# Patient Record
Sex: Female | Born: 1935 | Race: White | Hispanic: No
Health system: Southern US, Community
[De-identification: ages and names within clinical notes are randomized; demographics above are authoritative.]

## PROBLEM LIST (undated history)

## (undated) DIAGNOSIS — M1990 Unspecified osteoarthritis, unspecified site: Secondary | ICD-10-CM

## (undated) DIAGNOSIS — N189 Chronic kidney disease, unspecified: Secondary | ICD-10-CM

## (undated) DIAGNOSIS — Z86010 Personal history of colonic polyps: Secondary | ICD-10-CM

## (undated) DIAGNOSIS — C801 Malignant (primary) neoplasm, unspecified: Secondary | ICD-10-CM

## (undated) DIAGNOSIS — T7840XA Allergy, unspecified, initial encounter: Secondary | ICD-10-CM

## (undated) DIAGNOSIS — G250 Essential tremor: Secondary | ICD-10-CM

## (undated) DIAGNOSIS — F329 Major depressive disorder, single episode, unspecified: Secondary | ICD-10-CM

## (undated) DIAGNOSIS — R300 Dysuria: Secondary | ICD-10-CM

## (undated) DIAGNOSIS — J449 Chronic obstructive pulmonary disease, unspecified: Secondary | ICD-10-CM

## (undated) DIAGNOSIS — I482 Chronic atrial fibrillation, unspecified: Secondary | ICD-10-CM

## (undated) DIAGNOSIS — I480 Paroxysmal atrial fibrillation: Secondary | ICD-10-CM

## (undated) DIAGNOSIS — R011 Cardiac murmur, unspecified: Secondary | ICD-10-CM

## (undated) DIAGNOSIS — D472 Monoclonal gammopathy: Secondary | ICD-10-CM

## (undated) DIAGNOSIS — I872 Venous insufficiency (chronic) (peripheral): Secondary | ICD-10-CM

## (undated) DIAGNOSIS — F32A Depression, unspecified: Secondary | ICD-10-CM

## (undated) DIAGNOSIS — I10 Essential (primary) hypertension: Secondary | ICD-10-CM

## (undated) DIAGNOSIS — I341 Nonrheumatic mitral (valve) prolapse: Secondary | ICD-10-CM

## (undated) DIAGNOSIS — K219 Gastro-esophageal reflux disease without esophagitis: Secondary | ICD-10-CM

## (undated) DIAGNOSIS — I951 Orthostatic hypotension: Secondary | ICD-10-CM

## (undated) HISTORY — DX: Dysuria: R30.0

## (undated) HISTORY — DX: Major depressive disorder, single episode, unspecified: F32.9

## (undated) HISTORY — PX: APPENDECTOMY: SHX54

## (undated) HISTORY — DX: Depression, unspecified: F32.A

## (undated) HISTORY — DX: Essential (primary) hypertension: I10

## (undated) HISTORY — DX: Essential tremor: G25.0

## (undated) HISTORY — DX: Unspecified osteoarthritis, unspecified site: M19.90

## (undated) HISTORY — PX: COLONOSCOPY: SHX174

## (undated) HISTORY — DX: Personal history of colonic polyps: Z86.010

## (undated) HISTORY — DX: Allergy, unspecified, initial encounter: T78.40XA

## (undated) HISTORY — DX: Chronic obstructive pulmonary disease, unspecified: J44.9

## (undated) HISTORY — PX: CATARACT EXTRACTION: SUR2

## (undated) HISTORY — PX: DILATION AND CURETTAGE OF UTERUS: SHX78

## (undated) HISTORY — DX: Paroxysmal atrial fibrillation: I48.0

## (undated) HISTORY — DX: Orthostatic hypotension: I95.1

## (undated) HISTORY — DX: Chronic atrial fibrillation, unspecified: I48.20

## (undated) HISTORY — DX: Gastro-esophageal reflux disease without esophagitis: K21.9

## (undated) HISTORY — DX: Chronic kidney disease, unspecified: N18.9

## (undated) HISTORY — DX: Venous insufficiency (chronic) (peripheral): I87.2

## (undated) HISTORY — DX: Monoclonal gammopathy: D47.2

## (undated) HISTORY — PX: OVARIAN CYST REMOVAL: SHX89

## (undated) HISTORY — DX: Nonrheumatic mitral (valve) prolapse: I34.1

---

## 1998-01-26 ENCOUNTER — Ambulatory Visit (HOSPITAL_COMMUNITY): Admission: RE | Admit: 1998-01-26 | Discharge: 1998-01-26 | Payer: Self-pay | Admitting: Family Medicine

## 1999-01-30 ENCOUNTER — Encounter: Payer: Self-pay | Admitting: Family Medicine

## 1999-01-30 ENCOUNTER — Ambulatory Visit (HOSPITAL_COMMUNITY): Admission: RE | Admit: 1999-01-30 | Discharge: 1999-01-30 | Payer: Self-pay | Admitting: Family Medicine

## 1999-07-05 ENCOUNTER — Other Ambulatory Visit: Admission: RE | Admit: 1999-07-05 | Discharge: 1999-07-05 | Payer: Self-pay | Admitting: Family Medicine

## 2000-02-12 ENCOUNTER — Encounter: Payer: Self-pay | Admitting: Family Medicine

## 2000-02-12 ENCOUNTER — Ambulatory Visit (HOSPITAL_COMMUNITY): Admission: RE | Admit: 2000-02-12 | Discharge: 2000-02-12 | Payer: Self-pay | Admitting: Family Medicine

## 2001-02-18 ENCOUNTER — Encounter: Payer: Self-pay | Admitting: Family Medicine

## 2001-02-18 ENCOUNTER — Ambulatory Visit (HOSPITAL_COMMUNITY): Admission: RE | Admit: 2001-02-18 | Discharge: 2001-02-18 | Payer: Self-pay | Admitting: Family Medicine

## 2002-03-02 ENCOUNTER — Encounter: Payer: Self-pay | Admitting: Family Medicine

## 2002-03-02 ENCOUNTER — Ambulatory Visit (HOSPITAL_COMMUNITY): Admission: RE | Admit: 2002-03-02 | Discharge: 2002-03-02 | Payer: Self-pay | Admitting: Family Medicine

## 2003-03-10 ENCOUNTER — Encounter: Payer: Self-pay | Admitting: Family Medicine

## 2003-03-10 ENCOUNTER — Ambulatory Visit (HOSPITAL_COMMUNITY): Admission: RE | Admit: 2003-03-10 | Discharge: 2003-03-10 | Payer: Self-pay | Admitting: Family Medicine

## 2004-03-12 ENCOUNTER — Ambulatory Visit (HOSPITAL_COMMUNITY): Admission: RE | Admit: 2004-03-12 | Discharge: 2004-03-12 | Payer: Self-pay | Admitting: Family Medicine

## 2005-03-14 ENCOUNTER — Ambulatory Visit (HOSPITAL_COMMUNITY): Admission: RE | Admit: 2005-03-14 | Discharge: 2005-03-14 | Payer: Self-pay | Admitting: Family Medicine

## 2005-10-02 DIAGNOSIS — Z860100 Personal history of colon polyps, unspecified: Secondary | ICD-10-CM

## 2005-10-02 HISTORY — DX: Personal history of colon polyps, unspecified: Z86.0100

## 2005-10-02 HISTORY — DX: Personal history of colonic polyps: Z86.010

## 2006-03-18 ENCOUNTER — Ambulatory Visit (HOSPITAL_COMMUNITY): Admission: RE | Admit: 2006-03-18 | Discharge: 2006-03-18 | Payer: Self-pay | Admitting: Family Medicine

## 2007-03-25 ENCOUNTER — Encounter: Admission: RE | Admit: 2007-03-25 | Discharge: 2007-03-25 | Payer: Self-pay | Admitting: Family Medicine

## 2007-09-30 ENCOUNTER — Encounter: Admission: RE | Admit: 2007-09-30 | Discharge: 2007-11-04 | Payer: Self-pay | Admitting: Family Medicine

## 2008-03-30 ENCOUNTER — Encounter: Admission: RE | Admit: 2008-03-30 | Discharge: 2008-03-30 | Payer: Self-pay | Admitting: Family Medicine

## 2008-04-01 ENCOUNTER — Encounter: Admission: RE | Admit: 2008-04-01 | Discharge: 2008-04-01 | Payer: Self-pay | Admitting: Family Medicine

## 2008-09-15 ENCOUNTER — Emergency Department (HOSPITAL_COMMUNITY): Admission: EM | Admit: 2008-09-15 | Discharge: 2008-09-15 | Payer: Self-pay | Admitting: Emergency Medicine

## 2009-04-05 ENCOUNTER — Encounter: Admission: RE | Admit: 2009-04-05 | Discharge: 2009-04-05 | Payer: Self-pay | Admitting: Family Medicine

## 2009-04-10 ENCOUNTER — Encounter: Admission: RE | Admit: 2009-04-10 | Discharge: 2009-04-10 | Payer: Self-pay | Admitting: Family Medicine

## 2010-04-11 ENCOUNTER — Encounter: Admission: RE | Admit: 2010-04-11 | Discharge: 2010-04-11 | Payer: Self-pay | Admitting: Family Medicine

## 2010-08-13 ENCOUNTER — Encounter: Payer: Self-pay | Admitting: Family Medicine

## 2010-11-06 LAB — DIFFERENTIAL
Basophils Absolute: 0 10*3/uL (ref 0.0–0.1)
Eosinophils Relative: 0 % (ref 0–5)
Lymphocytes Relative: 4 % — ABNORMAL LOW (ref 12–46)
Lymphs Abs: 0.2 10*3/uL — ABNORMAL LOW (ref 0.7–4.0)
Neutro Abs: 4.1 10*3/uL (ref 1.7–7.7)

## 2010-11-06 LAB — COMPREHENSIVE METABOLIC PANEL
AST: 34 U/L (ref 0–37)
Albumin: 3.6 g/dL (ref 3.5–5.2)
BUN: 30 mg/dL — ABNORMAL HIGH (ref 6–23)
CO2: 25 mEq/L (ref 19–32)
Calcium: 9.4 mg/dL (ref 8.4–10.5)
Creatinine, Ser: 1.16 mg/dL (ref 0.4–1.2)
GFR calc Af Amer: 54 mL/min — ABNORMAL LOW (ref >60)
GFR calc non Af Amer: 45 mL/min — ABNORMAL LOW (ref >60)

## 2010-11-06 LAB — CBC
HCT: 40.2 % (ref 36.0–46.0)
MCHC: 34.5 g/dL (ref 30.0–36.0)
MCV: 95.7 fL (ref 78.0–100.0)
Platelets: 146 10*3/uL — ABNORMAL LOW (ref 150–400)

## 2010-11-06 LAB — PROTIME-INR: Prothrombin Time: 13.4 seconds (ref 11.6–15.2)

## 2010-11-06 LAB — CK TOTAL AND CKMB (NOT AT ARMC)
Relative Index: INVALID (ref 0.0–2.5)
Total CK: 31 U/L (ref 7–177)

## 2010-11-06 LAB — APTT: aPTT: 29 seconds (ref 24–37)

## 2011-03-26 ENCOUNTER — Other Ambulatory Visit: Payer: Self-pay | Admitting: Family Medicine

## 2011-03-26 DIAGNOSIS — Z1231 Encounter for screening mammogram for malignant neoplasm of breast: Secondary | ICD-10-CM

## 2011-04-11 ENCOUNTER — Other Ambulatory Visit: Payer: Self-pay | Admitting: Family Medicine

## 2011-04-17 ENCOUNTER — Ambulatory Visit
Admission: RE | Admit: 2011-04-17 | Discharge: 2011-04-17 | Disposition: A | Discharge status: Home / Self Care | Payer: Medicare Other | Source: Ambulatory Visit | Attending: Family Medicine | Admitting: Family Medicine

## 2011-04-17 DIAGNOSIS — Z1231 Encounter for screening mammogram for malignant neoplasm of breast: Secondary | ICD-10-CM

## 2011-04-24 ENCOUNTER — Ambulatory Visit
Admission: RE | Admit: 2011-04-24 | Discharge: 2011-04-24 | Disposition: A | Discharge status: Home / Self Care | Payer: Medicare Other | Source: Ambulatory Visit | Attending: Family Medicine | Admitting: Family Medicine

## 2011-09-30 ENCOUNTER — Other Ambulatory Visit: Payer: Self-pay | Admitting: Family Medicine

## 2011-09-30 DIAGNOSIS — R590 Localized enlarged lymph nodes: Secondary | ICD-10-CM

## 2011-09-30 DIAGNOSIS — R634 Abnormal weight loss: Secondary | ICD-10-CM

## 2011-10-02 ENCOUNTER — Ambulatory Visit
Admission: RE | Admit: 2011-10-02 | Discharge: 2011-10-02 | Disposition: A | Discharge status: Home / Self Care | Payer: Medicare Other | Source: Ambulatory Visit | Attending: Family Medicine | Admitting: Family Medicine

## 2011-10-02 DIAGNOSIS — R590 Localized enlarged lymph nodes: Secondary | ICD-10-CM

## 2011-10-02 DIAGNOSIS — R634 Abnormal weight loss: Secondary | ICD-10-CM

## 2011-10-02 MED ORDER — IOHEXOL 300 MG/ML  SOLN: 40.0000 mL | Freq: Once | INTRAMUSCULAR | Status: AC | PRN

## 2011-10-02 MED ORDER — IOHEXOL 300 MG/ML  SOLN
30.0000 mL | Freq: Once | INTRAMUSCULAR | Status: AC | PRN
  Administered 2011-10-02: 30 mL via ORAL

## 2011-10-02 MED ORDER — IOHEXOL 300 MG/ML  SOLN
100.0000 mL | Freq: Once | INTRAMUSCULAR | Status: AC | PRN
  Administered 2011-10-02: 100 mL via INTRAVENOUS

## 2011-10-23 ENCOUNTER — Encounter (INDEPENDENT_AMBULATORY_CARE_PROVIDER_SITE_OTHER): Payer: Self-pay | Admitting: General Surgery

## 2011-10-25 ENCOUNTER — Ambulatory Visit (INDEPENDENT_AMBULATORY_CARE_PROVIDER_SITE_OTHER): Payer: Medicare Other | Admitting: Surgery

## 2011-10-25 ENCOUNTER — Encounter (INDEPENDENT_AMBULATORY_CARE_PROVIDER_SITE_OTHER): Payer: Self-pay | Admitting: Surgery

## 2011-10-25 VITALS — BP 122/80 | HR 64 | Temp 98.0°F | Resp 18 | Ht 63.0 in | Wt 116.1 lb

## 2011-10-25 DIAGNOSIS — R591 Generalized enlarged lymph nodes: Secondary | ICD-10-CM

## 2011-10-25 DIAGNOSIS — R599 Enlarged lymph nodes, unspecified: Secondary | ICD-10-CM

## 2011-10-25 NOTE — Progress Notes (Signed)
CC: Enlarged lymph nodes HPI: This 78 year old lady has been referred for evaluation of some bilateral inguinal and occasional cervical adenopathy. She has also had significant weight loss over the last several months. The patient relates that to stress in her life. She has not had fevers or chills or night sweats. She did have a CT scan done which showed multiple inguinal nodes none of which appeared pathologic. They are nott symptomatic.   ROS: Her review of systems is positive for weight loss arthritis pains problems with nausea easy bruising and enlargement  MEDS: Current Outpatient Prescriptions  Medication Sig Dispense Refill  . acetaminophen (TYLENOL) 650 MG CR tablet Take 650 mg by mouth every 8 (eight) hours as needed.      Marland Kitchen alendronate (FOSAMAX) 70 MG tablet Take 70 mg by mouth every 7 (seven) days. Take with a full glass of water on an empty stomach.      Marland Kitchen atenolol (TENORMIN) 50 MG tablet Take 50 mg by mouth daily.      . Calcium Carbonate-Vitamin D (CALCIUM-D) 600-400 MG-UNIT TABS Take by mouth.      . chlordiazePOXIDE (LIBRIUM) 10 MG capsule Take 10 mg by mouth 2 (two) times daily as needed.      Marland Kitchen dextromethorphan-guaiFENesin (MUCINEX DM) 30-600 MG per 12 hr tablet Take 1 tablet by mouth as needed.      . fluticasone (FLONASE) 50 MCG/ACT nasal spray Place 2 sprays into the nose daily.      . hydrochlorothiazide (HYDRODIURIL) 25 MG tablet Take 25 mg by mouth daily.      Marland Kitchen HYDROcodone-acetaminophen (NORCO) 5-325 MG per tablet Take 1 tablet by mouth every 6 (six) hours as needed.      . hyoscyamine (LEVSIN SL) 0.125 MG SL tablet Place 0.125 mg under the tongue every 4 (four) hours as needed.      Marland Kitchen ketotifen (ZADITOR) 0.025 % ophthalmic solution 1 drop 2 (two) times daily.      . meclizine (ANTIVERT) 25 MG tablet Take 25 mg by mouth 3 (three) times daily as needed.      . Multiple Vitamin (MULTIVITAMIN) capsule Take 1 capsule by mouth daily.      . nabumetone (RELAFEN) 750 MG  tablet Take 750 mg by mouth 2 (two) times daily.      . ranitidine (ZANTAC) 300 MG tablet Take 300 mg by mouth at bedtime.      . sertraline (ZOLOFT) 50 MG tablet Take 50 mg by mouth daily.         ALLERGIES: Allergies  Allergen Reactions  . Amoxicillin Nausea And Vomiting  . Aspirin Nausea And Vomiting  . Benadryl (Altaryl) Nausea And Vomiting  . Ibuprofen Nausea And Vomiting  . Sulfa Antibiotics Itching  . Sulindac     Ineffective for arthritis  . Penicillins Rash      PE VS: BP 122/80  Pulse 64  Temp(Src) 98 F (36.7 C) (Temporal)  Resp 18  Ht 5\' 3"  (1.6 m)  Wt 116 lb 2 oz (52.674 kg)  BMI 20.57 kg/m2  GENERAL:  The patient is alert, oriented, very thin, NAD. Mood and affect are normal.  HEENT:  The head is normocephalic, the eyes nonicteric, the pupils were round regular and equal. EOMs are normal. Pharynx normal. Dentition good.  NECK:  The neck is supple and there are no masses or thyromegaly.A few small nodes that feel normal  LUNGS: Normal respirations and clear to auscultation.  HEART: Regular rhythm, with no murmurs rubs  or gallops. Pulses are intact carotid dorsalis pedis and posterior tibial. No significant varicosities are noted.   ABDOMEN: Soft, flat, and nontender. No masses or organomegaly is noted. No hernias are noted. Bowel sounds are normal.Bilateral inguinal adenopathy, multilpe 1 to 1.5 cm firm nodes  EXTREMITIES:  Good range of motion, no edema.  Data Reviewed I have reviewed the notes from her primary care physician and her CT scan and reports.  Assessment Marked inguinal adenopathy however I think benign Weight loss of uncertain etiology  Plan I discussed alternatives with the patient including followup versus excisional biopsy. She knows that she has cervical nodes were removed many years ago under local anesthesia once to go ahead and have a surgical excisional biopsy of some inguinal nodes for diagnostic purposes.

## 2011-10-25 NOTE — Patient Instructions (Signed)
We will schedule outpatient surgery to remove a few of the lymph nodes from your groin to test them.

## 2011-11-13 ENCOUNTER — Encounter (HOSPITAL_BASED_OUTPATIENT_CLINIC_OR_DEPARTMENT_OTHER): Payer: Self-pay | Admitting: *Deleted

## 2011-11-13 NOTE — Progress Notes (Signed)
To come in for bmet and ekg cxr done at dr Lorre Nick

## 2011-11-14 ENCOUNTER — Encounter (HOSPITAL_BASED_OUTPATIENT_CLINIC_OR_DEPARTMENT_OTHER)
Admission: RE | Admit: 2011-11-14 | Discharge: 2011-11-14 | Disposition: A | Discharge status: Home / Self Care | Payer: Medicare Other | Source: Ambulatory Visit | Attending: Surgery | Admitting: Surgery

## 2011-11-14 ENCOUNTER — Other Ambulatory Visit: Payer: Self-pay

## 2011-11-14 LAB — BASIC METABOLIC PANEL
BUN: 23 mg/dL (ref 6–23)
Chloride: 103 mEq/L (ref 96–112)
Glucose, Bld: 90 mg/dL (ref 70–99)
Potassium: 3.5 mEq/L (ref 3.5–5.1)

## 2011-11-18 ENCOUNTER — Ambulatory Visit (HOSPITAL_BASED_OUTPATIENT_CLINIC_OR_DEPARTMENT_OTHER): Payer: Medicare Other | Admitting: Anesthesiology

## 2011-11-18 ENCOUNTER — Ambulatory Visit (HOSPITAL_BASED_OUTPATIENT_CLINIC_OR_DEPARTMENT_OTHER)
Admission: RE | Admit: 2011-11-18 | Discharge: 2011-11-18 | Disposition: A | Discharge status: Home / Self Care | Payer: Medicare Other | Source: Ambulatory Visit | Attending: Surgery | Admitting: Surgery

## 2011-11-18 ENCOUNTER — Encounter (HOSPITAL_BASED_OUTPATIENT_CLINIC_OR_DEPARTMENT_OTHER)
Admission: RE | Disposition: A | Discharge status: Home / Self Care | Payer: Self-pay | Source: Ambulatory Visit | Attending: Surgery

## 2011-11-18 ENCOUNTER — Encounter (HOSPITAL_BASED_OUTPATIENT_CLINIC_OR_DEPARTMENT_OTHER): Payer: Self-pay | Admitting: Anesthesiology

## 2011-11-18 DIAGNOSIS — J449 Chronic obstructive pulmonary disease, unspecified: Secondary | ICD-10-CM | POA: Insufficient documentation

## 2011-11-18 DIAGNOSIS — R591 Generalized enlarged lymph nodes: Secondary | ICD-10-CM

## 2011-11-18 DIAGNOSIS — Z01810 Encounter for preprocedural cardiovascular examination: Secondary | ICD-10-CM | POA: Insufficient documentation

## 2011-11-18 DIAGNOSIS — K219 Gastro-esophageal reflux disease without esophagitis: Secondary | ICD-10-CM | POA: Insufficient documentation

## 2011-11-18 DIAGNOSIS — J4489 Other specified chronic obstructive pulmonary disease: Secondary | ICD-10-CM | POA: Insufficient documentation

## 2011-11-18 DIAGNOSIS — R634 Abnormal weight loss: Secondary | ICD-10-CM | POA: Insufficient documentation

## 2011-11-18 DIAGNOSIS — R599 Enlarged lymph nodes, unspecified: Secondary | ICD-10-CM

## 2011-11-18 DIAGNOSIS — I10 Essential (primary) hypertension: Secondary | ICD-10-CM | POA: Insufficient documentation

## 2011-11-18 HISTORY — DX: Cardiac murmur, unspecified: R01.1

## 2011-11-18 SURGERY — BIOPSY, LYMPH NODE, INGUINAL, OPEN
Anesthesia: Monitor Anesthesia Care | Site: Abdomen | Laterality: Right | Wound class: Clean

## 2011-11-18 MED ORDER — CHLORHEXIDINE GLUCONATE 4 % EX LIQD: 1.0000 "application " | Freq: Once | CUTANEOUS | Status: DC

## 2011-11-18 MED ORDER — FENTANYL CITRATE 0.05 MG/ML IJ SOLN
INTRAMUSCULAR | Status: DC | PRN
  Administered 2011-11-18: 50 ug via INTRAVENOUS
  Administered 2011-11-18: 25 ug via INTRAVENOUS

## 2011-11-18 MED ORDER — LIDOCAINE HCL (CARDIAC) 20 MG/ML IV SOLN
INTRAVENOUS | Status: DC | PRN
  Administered 2011-11-18: 50 mg via INTRAVENOUS

## 2011-11-18 MED ORDER — PROPOFOL 10 MG/ML IV EMUL
INTRAVENOUS | Status: DC | PRN
  Administered 2011-11-18: 50 ug/kg/min via INTRAVENOUS

## 2011-11-18 MED ORDER — HYDROMORPHONE HCL PF 1 MG/ML IJ SOLN: 0.2500 mg | INTRAMUSCULAR | Status: DC | PRN

## 2011-11-18 MED ORDER — LIDOCAINE-EPINEPHRINE (PF) 1 %-1:200000 IJ SOLN
INTRAMUSCULAR | Status: DC | PRN
  Administered 2011-11-18: 08:00:00 via INTRAMUSCULAR

## 2011-11-18 MED ORDER — LACTATED RINGERS IV SOLN
INTRAVENOUS | Status: DC
  Administered 2011-11-18: 07:00:00 via INTRAVENOUS

## 2011-11-18 MED ORDER — ONDANSETRON HCL 4 MG/2ML IJ SOLN
INTRAMUSCULAR | Status: DC | PRN
  Administered 2011-11-18: 4 mg via INTRAVENOUS

## 2011-11-18 MED ORDER — ONDANSETRON HCL 4 MG/2ML IJ SOLN: 4.0000 mg | Freq: Once | INTRAMUSCULAR | Status: DC | PRN

## 2011-11-18 SURGICAL SUPPLY — 36 items
ADH SKN CLS APL DERMABOND .7 (GAUZE/BANDAGES/DRESSINGS) ×1
BLADE HEX COATED 2.75 (ELECTRODE) ×2 IMPLANT
BLADE SURG 10 STRL SS (BLADE) ×2 IMPLANT
BLADE SURG ROTATE 9660 (MISCELLANEOUS) ×2 IMPLANT
CHLORAPREP W/TINT 26ML (MISCELLANEOUS) ×2 IMPLANT
CLIP TI WIDE RED SMALL 6 (CLIP) IMPLANT
CLOTH BEACON ORANGE TIMEOUT ST (SAFETY) ×2 IMPLANT
COVER MAYO STAND STRL (DRAPES) ×2 IMPLANT
COVER TABLE BACK 60X90 (DRAPES) ×2 IMPLANT
DECANTER SPIKE VIAL GLASS SM (MISCELLANEOUS) IMPLANT
DERMABOND ADVANCED (GAUZE/BANDAGES/DRESSINGS) ×1
DERMABOND ADVANCED .7 DNX12 (GAUZE/BANDAGES/DRESSINGS) ×1 IMPLANT
DRAPE LAPAROTOMY TRNSV 102X78 (DRAPE) ×2 IMPLANT
DRAPE UTILITY XL STRL (DRAPES) ×2 IMPLANT
ELECT REM PT RETURN 9FT ADLT (ELECTROSURGICAL) ×2
ELECTRODE REM PT RTRN 9FT ADLT (ELECTROSURGICAL) ×1 IMPLANT
GLOVE ECLIPSE 6.5 STRL STRAW (GLOVE) ×1 IMPLANT
GLOVE EUDERMIC 7 POWDERFREE (GLOVE) ×2 IMPLANT
GOWN PREVENTION PLUS XLARGE (GOWN DISPOSABLE) ×4 IMPLANT
NDL HYPO 25X1 1.5 SAFETY (NEEDLE) ×1 IMPLANT
NEEDLE HYPO 25X1 1.5 SAFETY (NEEDLE) ×2 IMPLANT
NS IRRIG 1000ML POUR BTL (IV SOLUTION) IMPLANT
PACK BASIN DAY SURGERY FS (CUSTOM PROCEDURE TRAY) ×2 IMPLANT
PENCIL BUTTON HOLSTER BLD 10FT (ELECTRODE) ×2 IMPLANT
SLEEVE SCD COMPRESS KNEE MED (MISCELLANEOUS) IMPLANT
SPONGE INTESTINAL PEANUT (DISPOSABLE) ×2 IMPLANT
SPONGE LAP 4X18 X RAY DECT (DISPOSABLE) ×2 IMPLANT
SUT MNCRL AB 4-0 PS2 18 (SUTURE) ×2 IMPLANT
SUT VIC AB 3-0 CT1 27 (SUTURE) ×4
SUT VIC AB 3-0 CT1 27XBRD (SUTURE) ×2 IMPLANT
SUT VIC AB 4-0 BRD 54 (SUTURE) ×2 IMPLANT
SYR CONTROL 10ML LL (SYRINGE) ×2 IMPLANT
TAPE HYPAFIX 4 X10 (GAUZE/BANDAGES/DRESSINGS) IMPLANT
TOWEL OR 17X24 6PK STRL BLUE (TOWEL DISPOSABLE) ×2 IMPLANT
TOWEL OR NON WOVEN STRL DISP B (DISPOSABLE) ×2 IMPLANT
WATER STERILE IRR 1000ML POUR (IV SOLUTION) IMPLANT

## 2011-11-18 NOTE — Interval H&P Note (Signed)
History and Physical Interval Note:  11/18/2011 7:13 AM  [name]  has presented today for surgery, with the diagnosis of Lymphadenopathy  The various methods of treatment have been discussed with the patient and family. After consideration of risks, benefits and other options for treatment, the patient has consented to  Procedure(s) (LRB): INGUINAL LYMPH NODE BIOPSY (Right) as a surgical intervention .  The patients' history has been reviewed, patient examined, no change in status, stable for surgery.  I have reviewed the patients' chart and labs.  Questions were answered to the patient's satisfaction.     Delicia Berens J

## 2011-11-18 NOTE — Anesthesia Postprocedure Evaluation (Signed)
  Anesthesia Post-op Note  Patient: [name]  Procedure(s) Performed: Procedure(s) (LRB): INGUINAL LYMPH NODE BIOPSY (Right)  Patient Location: PACU  Anesthesia Type: MAC  Level of Consciousness: awake, alert  and oriented  Airway and Oxygen Therapy: Patient Spontanous Breathing  Post-op Pain: mild  Post-op Assessment: Post-op Vital signs reviewed  Post-op Vital Signs: Reviewed  Complications: No apparent anesthesia complications

## 2011-11-18 NOTE — H&P (View-Only) (Signed)
CC: Enlarged lymph nodes HPI: This 78 year old lady has been referred for evaluation of some bilateral inguinal and occasional cervical adenopathy. She has also had significant weight loss over the last several months. The patient relates that to stress in her life. She has not had fevers or chills or night sweats. She did have a CT scan done which showed multiple inguinal nodes none of which appeared pathologic. They are nott symptomatic.   ROS: Her review of systems is positive for weight loss arthritis pains problems with nausea easy bruising and enlargement  MEDS: Current Outpatient Prescriptions  Medication Sig Dispense Refill  . acetaminophen (TYLENOL) 650 MG CR tablet Take 650 mg by mouth every 8 (eight) hours as needed.      Marland Kitchen alendronate (FOSAMAX) 70 MG tablet Take 70 mg by mouth every 7 (seven) days. Take with a full glass of water on an empty stomach.      Marland Kitchen atenolol (TENORMIN) 50 MG tablet Take 50 mg by mouth daily.      . Calcium Carbonate-Vitamin D (CALCIUM-D) 600-400 MG-UNIT TABS Take by mouth.      . chlordiazePOXIDE (LIBRIUM) 10 MG capsule Take 10 mg by mouth 2 (two) times daily as needed.      Marland Kitchen dextromethorphan-guaiFENesin (MUCINEX DM) 30-600 MG per 12 hr tablet Take 1 tablet by mouth as needed.      . fluticasone (FLONASE) 50 MCG/ACT nasal spray Place 2 sprays into the nose daily.      . hydrochlorothiazide (HYDRODIURIL) 25 MG tablet Take 25 mg by mouth daily.      Marland Kitchen HYDROcodone-acetaminophen (NORCO) 5-325 MG per tablet Take 1 tablet by mouth every 6 (six) hours as needed.      . hyoscyamine (LEVSIN SL) 0.125 MG SL tablet Place 0.125 mg under the tongue every 4 (four) hours as needed.      Marland Kitchen ketotifen (ZADITOR) 0.025 % ophthalmic solution 1 drop 2 (two) times daily.      . meclizine (ANTIVERT) 25 MG tablet Take 25 mg by mouth 3 (three) times daily as needed.      . Multiple Vitamin (MULTIVITAMIN) capsule Take 1 capsule by mouth daily.      . nabumetone (RELAFEN) 750 MG  tablet Take 750 mg by mouth 2 (two) times daily.      . ranitidine (ZANTAC) 300 MG tablet Take 300 mg by mouth at bedtime.      . sertraline (ZOLOFT) 50 MG tablet Take 50 mg by mouth daily.         ALLERGIES: Allergies  Allergen Reactions  . Amoxicillin Nausea And Vomiting  . Aspirin Nausea And Vomiting  . Benadryl (Altaryl) Nausea And Vomiting  . Ibuprofen Nausea And Vomiting  . Sulfa Antibiotics Itching  . Sulindac     Ineffective for arthritis  . Penicillins Rash      PE VS: BP 122/80  Pulse 64  Temp(Src) 98 F (36.7 C) (Temporal)  Resp 18  Ht 5\' 3"  (1.6 m)  Wt 116 lb 2 oz (52.674 kg)  BMI 20.57 kg/m2  GENERAL:  The patient is alert, oriented, very thin, NAD. Mood and affect are normal.  HEENT:  The head is normocephalic, the eyes nonicteric, the pupils were round regular and equal. EOMs are normal. Pharynx normal. Dentition good.  NECK:  The neck is supple and there are no masses or thyromegaly.A few small nodes that feel normal  LUNGS: Normal respirations and clear to auscultation.  HEART: Regular rhythm, with no murmurs rubs  or gallops. Pulses are intact carotid dorsalis pedis and posterior tibial. No significant varicosities are noted.   ABDOMEN: Soft, flat, and nontender. No masses or organomegaly is noted. No hernias are noted. Bowel sounds are normal.Bilateral inguinal adenopathy, multilpe 1 to 1.5 cm firm nodes  EXTREMITIES:  Good range of motion, no edema.  Data Reviewed I have reviewed the notes from her primary care physician and her CT scan and reports.  Assessment Marked inguinal adenopathy however I think benign Weight loss of uncertain etiology  Plan I discussed alternatives with the patient including followup versus excisional biopsy. She knows that she has cervical nodes were removed many years ago under local anesthesia once to go ahead and have a surgical excisional biopsy of some inguinal nodes for diagnostic purposes.

## 2011-11-18 NOTE — Transfer of Care (Signed)
Immediate Anesthesia Transfer of Care Note  Patient: [name]  Procedure(s) Performed: Procedure(s) (LRB): INGUINAL LYMPH NODE BIOPSY (Right)  Patient Location: PACU  Anesthesia Type: MAC  Level of Consciousness: awake, alert  and oriented  Airway & Oxygen Therapy: Patient Spontanous Breathing  Post-op Assessment: Report given to PACU RN and Post -op Vital signs reviewed and stable  Post vital signs: Reviewed and stable  Complications: No apparent anesthesia complications

## 2011-11-18 NOTE — Discharge Instructions (Addendum)
You may resume all usual activity. You may shower tomorrow. There are no sutures that need to be removed. Call to make an appointment for a post op visit. Call if there are any problems or concerns. We will call you when we have a path report available, probably Thursday   Post Anesthesia Home Care Instructions  Activity: Get plenty of rest for the remainder of the day. A responsible adult should stay with you for 24 hours following the procedure.  For the next 24 hours, DO NOT: -Drive a car -Paediatric nurse -Drink alcoholic beverages -Take any medication unless instructed by your physician -Make any legal decisions or sign important papers.  Meals: Start with liquid foods such as gelatin or soup. Progress to regular foods as tolerated. Avoid greasy, spicy, heavy foods. If nausea and/or vomiting occur, drink only clear liquids until the nausea and/or vomiting subsides. Call your physician if vomiting continues.  Special Instructions/Symptoms: Your throat may feel dry or sore from the anesthesia or the breathing tube placed in your throat during surgery. If this causes discomfort, gargle with warm salt water. The discomfort should disappear within 24 hours.

## 2011-11-18 NOTE — Anesthesia Procedure Notes (Signed)
Procedure Name: MAC Performed by: Kirkland Figg W Pre-anesthesia Checklist: Patient identified, Timeout performed, Emergency Drugs available, Suction available and Patient being monitored Patient Re-evaluated:Patient Re-evaluated prior to induction Oxygen Delivery Method: Simple face mask       

## 2011-11-18 NOTE — Op Note (Signed)
[name] 07-May-1926 [id number] 10/25/2011  Preoperative diagnosis: Lymphadenopathy  Postoperative diagnosis: Same  Procedure: Excisional biopsy right inguinal lymph no  Surgeon: Haywood Lasso, MD, [facility]  Anesthesia: MAC   Clinical History and Indications: This patient was referred for evaluation of increasing bilateral adenopathy which was associated with weight loss of uncertain etiology. After review of situation she elected to proceed 2 excisional biopsy of one of the right inguinal lymph nodes.    Description of Procedure: The patient was seen in the preoperative area and the plans for the procedure reviewed. She had no further questions. The right inguinal area was marked as the surgical site.  The patient was taken to the operating room and given IV sedation. The inguinal area was prepped and draped in a time out was done.  The patient is very thin so the lymph nodes are visible. A combination of 1% Xylocaine with epinephrine and 0.5% Marcaine plain was mixed equally and used for local. This was infiltrated around the lymph node. A small inguinal incision was made and Scarpa's divided. A large lymph node was identified and carefully dissected out using hemostats to divide all of the tissue coming into this lymph node to diminish the chance of a postoperative lymphocele. This was excised completely. I thought there was adequate tissue for diagnosis and no other lymph nodes were removed.  The incision was closed in layers with 3-0 Vicryl, 4-0 Monocryl subcuticular, and Dermabond. The patient tolerated procedure well. There were no operative complications. All counts were correct. There was minimal blood loss.  Haywood Lasso, MD, FACS 11/18/2011 8:10 AM

## 2011-11-18 NOTE — Anesthesia Preprocedure Evaluation (Signed)
Anesthesia Evaluation  Patient identified by MRN, date of birth, ID band Patient awake    Reviewed: Allergy & Precautions, H&P , NPO status , Patient's Chart, lab work & pertinent test results, reviewed documented beta blocker date and time   Airway Mallampati: I TM Distance: >3 FB     Dental  (+) Teeth Intact and Dental Advisory Given   Pulmonary COPD breath sounds clear to auscultation        Cardiovascular hypertension, Rhythm:Regular Rate:Normal     Neuro/Psych    GI/Hepatic GERD-  Medicated and Controlled,  Endo/Other    Renal/GU      Musculoskeletal   Abdominal   Peds  Hematology   Anesthesia Other Findings   Reproductive/Obstetrics                           Anesthesia Physical Anesthesia Plan  ASA: III  Anesthesia Plan: MAC   Post-op Pain Management:    Induction: Intravenous  Airway Management Planned: Simple Face Mask  Additional Equipment:   Intra-op Plan:   Post-operative Plan:   Informed Consent: I have reviewed the patients History and Physical, chart, labs and discussed the procedure including the risks, benefits and alternatives for the proposed anesthesia with the patient or authorized representative who has indicated his/her understanding and acceptance.   Dental advisory given  Plan Discussed with: CRNA, Anesthesiologist and Surgeon  Anesthesia Plan Comments:         Anesthesia Quick Evaluation

## 2011-11-22 ENCOUNTER — Telehealth (INDEPENDENT_AMBULATORY_CARE_PROVIDER_SITE_OTHER): Payer: Self-pay | Admitting: General Surgery

## 2011-11-22 NOTE — Telephone Encounter (Signed)
Patient aware results are not back yet, but I will call her when I see them. She is okay with this plan.

## 2011-11-22 NOTE — Telephone Encounter (Signed)
Patient left voicemail requesting pathology results from surgery on Monday.

## 2011-11-25 ENCOUNTER — Telehealth (INDEPENDENT_AMBULATORY_CARE_PROVIDER_SITE_OTHER): Payer: Self-pay | Admitting: General Surgery

## 2011-11-25 NOTE — Telephone Encounter (Signed)
Spoke with Dr Andrew Au assistant who will get a message to him to see if he will speak with patient re: path report. He is not in the office til tomorrow but she stated she would pull it and ask him to call patient. They will call me back if this is unable to happen.

## 2011-11-25 NOTE — Telephone Encounter (Signed)
Called Dr Andrew Au office per Dr Donne Hazel re: path results. Left message for Raquel Sarna, his assistant, to call me back.

## 2011-11-26 ENCOUNTER — Telehealth (INDEPENDENT_AMBULATORY_CARE_PROVIDER_SITE_OTHER): Payer: Self-pay | Admitting: General Surgery

## 2011-11-26 ENCOUNTER — Telehealth: Payer: Self-pay | Admitting: Hematology and Oncology

## 2011-11-26 NOTE — Telephone Encounter (Signed)
lmonvm for pt re calling me for appt w/LO 

## 2011-11-26 NOTE — Telephone Encounter (Signed)
Dr Marisue Humble spoke with patient re: path results. They are referring patient to the cancer center.

## 2011-11-29 ENCOUNTER — Telehealth: Payer: Self-pay | Admitting: Oncology

## 2011-11-29 NOTE — Telephone Encounter (Signed)
lmonvm adviisng the pt of an appt to see dr odogwu on 12/11/2011. This is the second message

## 2011-12-03 ENCOUNTER — Telehealth: Payer: Self-pay | Admitting: Hematology and Oncology

## 2011-12-03 NOTE — Telephone Encounter (Signed)
Referred by Dr. Gaynelle Arabian  Dx- Follicular Lymphoma

## 2011-12-06 ENCOUNTER — Encounter (INDEPENDENT_AMBULATORY_CARE_PROVIDER_SITE_OTHER): Payer: Self-pay

## 2011-12-10 ENCOUNTER — Encounter (INDEPENDENT_AMBULATORY_CARE_PROVIDER_SITE_OTHER): Payer: Self-pay

## 2011-12-11 ENCOUNTER — Encounter: Payer: Self-pay | Admitting: Hematology and Oncology

## 2011-12-11 ENCOUNTER — Ambulatory Visit (HOSPITAL_BASED_OUTPATIENT_CLINIC_OR_DEPARTMENT_OTHER): Payer: Medicare Other | Admitting: Hematology and Oncology

## 2011-12-11 ENCOUNTER — Ambulatory Visit (HOSPITAL_BASED_OUTPATIENT_CLINIC_OR_DEPARTMENT_OTHER): Payer: Medicare Other

## 2011-12-11 ENCOUNTER — Ambulatory Visit: Payer: Medicare Other

## 2011-12-11 ENCOUNTER — Ambulatory Visit: Payer: Medicare Other | Admitting: Hematology and Oncology

## 2011-12-11 VITALS — BP 183/85 | HR 62 | Temp 98.3°F | Ht 63.0 in | Wt 112.4 lb

## 2011-12-11 DIAGNOSIS — C8299 Follicular lymphoma, unspecified, extranodal and solid organ sites: Secondary | ICD-10-CM

## 2011-12-11 DIAGNOSIS — R945 Abnormal results of liver function studies: Secondary | ICD-10-CM

## 2011-12-11 DIAGNOSIS — Z8572 Personal history of non-Hodgkin lymphomas: Secondary | ICD-10-CM | POA: Insufficient documentation

## 2011-12-11 DIAGNOSIS — C8290 Follicular lymphoma, unspecified, unspecified site: Secondary | ICD-10-CM

## 2011-12-11 DIAGNOSIS — R7989 Other specified abnormal findings of blood chemistry: Secondary | ICD-10-CM

## 2011-12-11 LAB — CBC WITH DIFFERENTIAL/PLATELET
Basophils Absolute: 0 10*3/uL (ref 0.0–0.1)
EOS%: 2.5 % (ref 0.0–7.0)
Eosinophils Absolute: 0.1 10*3/uL (ref 0.0–0.5)
HGB: 12.6 g/dL (ref 11.6–15.9)
LYMPH%: 35.3 % (ref 14.0–49.7)
MCH: 32.6 pg (ref 25.1–34.0)
MCV: 97.2 fL (ref 79.5–101.0)
MONO%: 8.1 % (ref 0.0–14.0)
Platelets: 156 10*3/uL (ref 145–400)
RBC: 3.86 10*6/uL (ref 3.70–5.45)
RDW: 13.3 % (ref 11.2–14.5)

## 2011-12-11 LAB — COMPREHENSIVE METABOLIC PANEL
Alkaline Phosphatase: 50 U/L (ref 39–117)
CO2: 30 mEq/L (ref 19–32)
Creatinine, Ser: 1.23 mg/dL — ABNORMAL HIGH (ref 0.50–1.10)
Glucose, Bld: 88 mg/dL (ref 70–99)
Sodium: 138 mEq/L (ref 135–145)
Total Bilirubin: 0.5 mg/dL (ref 0.3–1.2)
Total Protein: 8 g/dL (ref 6.0–8.3)

## 2011-12-11 LAB — LACTATE DEHYDROGENASE: LDH: 173 U/L (ref 94–250)

## 2011-12-11 NOTE — Progress Notes (Signed)
Dr.    Gaynelle Arabian      -     Primary.  [place].  Daughter    Bonner Puna     Cell    Phone      [phone].

## 2011-12-11 NOTE — Patient Instructions (Signed)
[name]  [id number]  [place] Discharge Instructions  RECOMMENDATIONS MADE BY THE CONSULTANT AND ANY TEST RESULTS WILL BE SENT TO YOUR REFERRING DOCTOR.   EXAM FINDINGS BY MD TODAY AND SIGNS AND SYMPTOMS TO REPORT TO CLINIC OR PRIMARY MD:   Your current list of medications are: Current Outpatient Prescriptions  Medication Sig Dispense Refill  . acetaminophen (TYLENOL) 650 MG CR tablet Take 650 mg by mouth every 8 (eight) hours as needed.      Marland Kitchen alendronate (FOSAMAX) 70 MG tablet Take 70 mg by mouth every 7 (seven) days. Take with a full glass of water on an empty stomach.      Marland Kitchen atenolol (TENORMIN) 50 MG tablet Take 50 mg by mouth daily.      . Calcium Carbonate-Vitamin D (CALCIUM-D) 600-400 MG-UNIT TABS Take by mouth.      . chlordiazePOXIDE (LIBRIUM) 10 MG capsule Take 10 mg by mouth 2 (two) times daily as needed.      Marland Kitchen dextromethorphan-guaiFENesin (MUCINEX DM) 30-600 MG per 12 hr tablet Take 1 tablet by mouth as needed.      . fluticasone (FLONASE) 50 MCG/ACT nasal spray Place 2 sprays into the nose daily.      . hydrochlorothiazide (HYDRODIURIL) 25 MG tablet Take 25 mg by mouth daily.      Marland Kitchen HYDROcodone-acetaminophen (NORCO) 5-325 MG per tablet Take 1 tablet by mouth every 6 (six) hours as needed.      . hyoscyamine (LEVSIN SL) 0.125 MG SL tablet Place 0.125 mg under the tongue every 4 (four) hours as needed.      Marland Kitchen ketotifen (ZADITOR) 0.025 % ophthalmic solution 1 drop 2 (two) times daily.      . meclizine (ANTIVERT) 25 MG tablet Take 25 mg by mouth 3 (three) times daily as needed.      . Multiple Vitamin (MULTIVITAMIN) capsule Take 1 capsule by mouth daily.      . nabumetone (RELAFEN) 750 MG tablet Take 750 mg by mouth 2 (two) times daily.      . ranitidine (ZANTAC) 300 MG tablet Take 300 mg by mouth at bedtime.      . sertraline (ZOLOFT) 50 MG tablet Take 50 mg by mouth daily.         INSTRUCTIONS GIVEN AND DISCUSSED:   SPECIAL  INSTRUCTIONS/FOLLOW-UP:  See above.  I acknowledge that I have been informed and understand all the instructions given to me and received a copy. I do not have any more questions at this time, but understand that I may call the [facility] at [phone] during business hours should I have any further questions or need assistance in obtaining follow-up care.

## 2011-12-11 NOTE — Progress Notes (Signed)
Pt is scheduled for Bone Marrow Biopsy on 12/12/11.   Written instructions given to pt and family.   Both [facility] Post @ [facility] lab and [place], infusion scheduler notified.

## 2011-12-11 NOTE — Progress Notes (Signed)
This office note has been dictated.

## 2011-12-11 NOTE — Progress Notes (Signed)
CC:   Modena Jansky. Marisue Humble, M.D. Haywood Lasso, M.D.  REFERRING PHYSICIAN:  Modena Jansky. Marisue Humble, M.D.  IDENTIFYING STATEMENT:  Patient is an 78 year old woman seen at request of Dr. Marisue Humble with lymphoma.  HISTORY OF PRESENT ILLNESS:  The patient describes intermittent swelling in the right and left inguinal areas over the years but more so the swelling has persisted in the last year.  She recalled that she had a node that was removed from the left neck and axilla 15 and 20 years ago. She was told that the areas were benign.  She also describes a 35 pound weight loss over 2 years.  She recently noted night sweats but denies fever or chills.  She has a good appetite but does have irritable bowel syndrome thus will have diarrhea with fatty rich foods.  Her energy levels are somewhat fair.  She is able to tolerate most of her activities of daily living but is limited with discomfort in the inguinal areas.  She received a CT scan of the abdomen and pelvis on 10/02/2011 that noted shotty bilateral inguinal nodes and a hiatal hernia, otherwise negative.  The liver, spleen and pancreas were unremarkable.  There was no mesenteric or retroperitoneal adenopathy. She was referred to Dr. Neldon Mc who, on 11/18/2011, performed an excisional biopsy of right inguinal node.  Surgical pathology revealed atypical follicular proliferation which was positive for Bcl-2 and CD20.  The overall impression was that this was clearly involvement of follicular lymphoma.  The patient is here with her daughter and niece to discuss treatment options.  PAST MEDICAL HISTORY:  Hypertension, osteoarthritis, osteoporosis, irritable bowel syndrome, chronic obstructive pulmonary disease, GERD, mitral valve prolapse, hiatal hernia.  ALLERGIES:  Amoxicillin, aspirin, Benadryl, ibuprofen, sulfa, Sulindac, penicillin.  SOCIAL HISTORY:  Denies alcohol, tobacco use.  She is estranged from her husband.  She is a  retired Network engineer.  Remains very active.  FAMILY HISTORY:  Sister died from lung cancer.  Brother had bone cancer. There is no family history for lymphoma, breast or colon cancer.  REVIEW OF SYSTEMS:  Denies fever, chills.  Notes recent night sweats. Denies anorexia.  Has lost 35 pounds over 2-year period.  GI:  Denies nausea, vomiting, abdominal pain.  Notes diarrhea.  Has irritable bowel syndrome.  Denies constipation.  Denies melena, hematochezia. Cardiovascular:  Denies chest pain, PND, orthopnea, ankle swelling. Respiratory:  Denies cough, hemoptysis, wheeze, shortness of breath. Neurologic:  Denies headaches, vision change, extremity weakness.  Rest of the review of systems negative.  PHYSICAL EXAM:  The patient is a little woman who looks younger than her stated age.  Vitals:  Pulse 62, blood pressure 182/85, temp 98.2, respirations 18, weight 112 pounds.  HEENT:  Head is atraumatic, normocephalic.  Sclerae anicteric.  Extraocular muscles intact.  Mouth moist without thrush.  Neck:  Supple.  Chest:  Clear to percussion auscultation.  CVS:  First and second heart sounds present.  No added sounds or murmurs.  Abdomen:  Soft, nontender.  Bowel sounds present. Lymph nodes:  No palpable cervical, axillary adenopathy.  Palpable shotty nontender bilateral inguinal adenopathy.  Extremities:  No calf tenderness.  Pulses present and symmetrical.  Skin:  No bruising.  CNS: Nonfocal.  IMPRESSION AND PLAN:  Mrs. Usman is a pleasant but anxious 78 year old woman diagnosed with a follicular lymphoma, Bcl-2 and CD20 positive, with B symptoms consistent with weight loss and night sweats.  On reviewing her history she may have had a similar diagnosis 15 years ago  when she had several enlarging node excised.  Her symptoms are indicators for treatment, but before we can do so, I would recommend that she complete staging with a CT scan of the neck and chest and a PET scan.  I also convinced her  to obtain a bone marrow biopsy which she will do tomorrow.  I will have her return to lab to obtain a CBC with differential, a comprehensive metabolic panel, LDH, and hepatitis panel. Whether her disease is limited or advanced, I will be recommending a CD20 monoclonal antibody such as rituximab.  We discussed treating weekly x4 and then restaging her for response.  We also discussed the possibility of placing her on maintenance therapy if she does obtain a partial/complete response to therapy.  We discussed the side effects of therapy which include infusion-related reactions, especially with the first infusion, mucocutaneous reactions, renal toxicity, possibility of tumor lysis syndrome.  The patient was given literature to review and she and her daughter will both attend chemotherapy class.  They both had a number of questions which were answered.  I spent more than half the time coordinating care.  The patient follows up in the morning for bone marrow exam.    ______________________________ Nira Retort, M.D. LIO/MEDQ  D:  12/11/2011  T:  12/11/2011  Job:  [id number]

## 2011-12-12 ENCOUNTER — Ambulatory Visit (INDEPENDENT_AMBULATORY_CARE_PROVIDER_SITE_OTHER): Payer: Medicare Other | Admitting: Surgery

## 2011-12-12 ENCOUNTER — Telehealth: Payer: Self-pay | Admitting: *Deleted

## 2011-12-12 ENCOUNTER — Other Ambulatory Visit: Payer: Self-pay | Admitting: Hematology and Oncology

## 2011-12-12 ENCOUNTER — Telehealth: Payer: Self-pay | Admitting: Hematology and Oncology

## 2011-12-12 ENCOUNTER — Encounter (INDEPENDENT_AMBULATORY_CARE_PROVIDER_SITE_OTHER): Payer: Self-pay | Admitting: Surgery

## 2011-12-12 VITALS — BP 146/82 | HR 58 | Temp 97.6°F | Resp 14 | Ht 64.0 in | Wt 111.2 lb

## 2011-12-12 DIAGNOSIS — Z9889 Other specified postprocedural states: Secondary | ICD-10-CM

## 2011-12-12 DIAGNOSIS — C8290 Follicular lymphoma, unspecified, unspecified site: Secondary | ICD-10-CM

## 2011-12-12 DIAGNOSIS — C8299 Follicular lymphoma, unspecified, extranodal and solid organ sites: Secondary | ICD-10-CM

## 2011-12-12 LAB — HEPATITIS PANEL, ACUTE
HCV Ab: NEGATIVE
Hep A IgM: NEGATIVE
Hep B C IgM: NEGATIVE
Hepatitis B Surface Ag: NEGATIVE

## 2011-12-12 LAB — BETA 2 MICROGLOBULIN, SERUM: Beta-2 Microglobulin: 3.13 mg/L — ABNORMAL HIGH (ref 1.01–1.73)

## 2011-12-12 NOTE — Telephone Encounter (Signed)
called pt and informed her that Dr. Jamse Arn wants her to keep her appt with her on 05/31.  pt will have ct scan on 05/31 @ [facility]. pt was will pick up schedule for [id number]. on 05/31.  Dr. Jamse Arn approved for pt to start chemo on 06/13 per pt request.

## 2011-12-12 NOTE — Telephone Encounter (Signed)
Spoke with daughter Bonner Puna and was informed that she is aware of pt's decision to cancel bone marrow biopsy today.   Confirmed with Stanton Kidney that pt would want to go forward with Rituxan treatments;  However,  Per Stanton Kidney,  Pt would like to get started  The week of  June 10  Since pt has other obligation the week before .    Message to md.

## 2011-12-12 NOTE — Telephone Encounter (Signed)
Per staff message from [place], I have scheduled treatment appts.  JMW  

## 2011-12-12 NOTE — Progress Notes (Signed)
NAME: [name]                                            DOB: [birth year] DATE: 12/12/2011                                                  MRN: [id number]  CC: Post op   HPI: This patient comes in for post op follow-up.Sheunderwent removal of a right inguinal LN on 11/18/11. She feels that she is doing well.  PE: General: The patient appears to be healthy, NAD Wound healing but small seroma  DATA REVIEWED: Path showed lymphoma and she has seen Dr Jamse Arn  IMPRESSION: The patient is doing well S/P LN excision, small seroma.    PLAN: Aspirated 10 cc with resolution. RTC PRN

## 2011-12-20 ENCOUNTER — Telehealth: Payer: Self-pay | Admitting: Hematology and Oncology

## 2011-12-20 ENCOUNTER — Telehealth: Payer: Self-pay | Admitting: *Deleted

## 2011-12-20 ENCOUNTER — Encounter (HOSPITAL_COMMUNITY): Payer: Self-pay

## 2011-12-20 ENCOUNTER — Encounter: Payer: Self-pay | Admitting: Hematology and Oncology

## 2011-12-20 ENCOUNTER — Ambulatory Visit (HOSPITAL_COMMUNITY)
Admission: RE | Admit: 2011-12-20 | Discharge: 2011-12-20 | Disposition: A | Discharge status: Home / Self Care | Payer: Medicare Other | Source: Ambulatory Visit | Attending: Hematology and Oncology | Admitting: Hematology and Oncology

## 2011-12-20 ENCOUNTER — Ambulatory Visit (HOSPITAL_BASED_OUTPATIENT_CLINIC_OR_DEPARTMENT_OTHER): Payer: Medicare Other | Admitting: Hematology and Oncology

## 2011-12-20 VITALS — BP 148/78 | HR 63 | Temp 97.0°F | Ht 64.0 in | Wt 111.7 lb

## 2011-12-20 DIAGNOSIS — I10 Essential (primary) hypertension: Secondary | ICD-10-CM | POA: Insufficient documentation

## 2011-12-20 DIAGNOSIS — M899 Disorder of bone, unspecified: Secondary | ICD-10-CM | POA: Insufficient documentation

## 2011-12-20 DIAGNOSIS — R945 Abnormal results of liver function studies: Secondary | ICD-10-CM

## 2011-12-20 DIAGNOSIS — C8299 Follicular lymphoma, unspecified, extranodal and solid organ sites: Secondary | ICD-10-CM

## 2011-12-20 DIAGNOSIS — J4489 Other specified chronic obstructive pulmonary disease: Secondary | ICD-10-CM | POA: Insufficient documentation

## 2011-12-20 DIAGNOSIS — I517 Cardiomegaly: Secondary | ICD-10-CM | POA: Insufficient documentation

## 2011-12-20 DIAGNOSIS — J449 Chronic obstructive pulmonary disease, unspecified: Secondary | ICD-10-CM | POA: Insufficient documentation

## 2011-12-20 DIAGNOSIS — C8290 Follicular lymphoma, unspecified, unspecified site: Secondary | ICD-10-CM

## 2011-12-20 DIAGNOSIS — J984 Other disorders of lung: Secondary | ICD-10-CM | POA: Insufficient documentation

## 2011-12-20 HISTORY — DX: Malignant (primary) neoplasm, unspecified: C80.1

## 2011-12-20 MED ORDER — IOHEXOL 300 MG/ML  SOLN
80.0000 mL | Freq: Once | INTRAMUSCULAR | Status: AC | PRN
  Administered 2011-12-20: 80 mL via INTRAVENOUS

## 2011-12-20 NOTE — Patient Instructions (Signed)
[name]  [id number]  [place] Discharge Instructions  RECOMMENDATIONS MADE BY THE CONSULTANT AND ANY TEST RESULTS WILL BE SENT TO YOUR REFERRING DOCTOR.   EXAM FINDINGS BY MD TODAY AND SIGNS AND SYMPTOMS TO REPORT TO CLINIC OR PRIMARY MD:   Your current list of medications are: Current Outpatient Prescriptions  Medication Sig Dispense Refill  . acetaminophen (TYLENOL) 650 MG CR tablet Take 650 mg by mouth every 8 (eight) hours as needed.      Marland Kitchen alendronate (FOSAMAX) 70 MG tablet Take 70 mg by mouth every 7 (seven) days. Take with a full glass of water on an empty stomach.      Marland Kitchen atenolol (TENORMIN) 50 MG tablet Take 50 mg by mouth daily.      . Calcium Carbonate-Vitamin D (CALCIUM-D) 600-400 MG-UNIT TABS Take by mouth.      . chlordiazePOXIDE (LIBRIUM) 10 MG capsule Take 10 mg by mouth 2 (two) times daily as needed.      Marland Kitchen dextromethorphan-guaiFENesin (MUCINEX DM) 30-600 MG per 12 hr tablet Take 1 tablet by mouth as needed.      . fluticasone (FLONASE) 50 MCG/ACT nasal spray Place 2 sprays into the nose daily as needed.       . hydrochlorothiazide (HYDRODIURIL) 25 MG tablet Take 25 mg by mouth daily.      Marland Kitchen HYDROcodone-acetaminophen (NORCO) 5-325 MG per tablet Take 1 tablet by mouth every 6 (six) hours as needed.      . hyoscyamine (LEVSIN SL) 0.125 MG SL tablet Place 0.125 mg under the tongue every 4 (four) hours as needed.      Marland Kitchen ketotifen (ZADITOR) 0.025 % ophthalmic solution Place 1 drop into both eyes 2 (two) times daily.       . meclizine (ANTIVERT) 25 MG tablet Take 25 mg by mouth 3 (three) times daily as needed.      . Multiple Vitamin (MULTIVITAMIN) capsule Take 1 capsule by mouth daily.      . nabumetone (RELAFEN) 750 MG tablet Take 750 mg by mouth 2 (two) times daily.      . ranitidine (ZANTAC) 300 MG tablet Take 300 mg by mouth at bedtime as needed.       . sertraline (ZOLOFT) 50 MG tablet Take 50 mg by mouth daily.         INSTRUCTIONS GIVEN AND  DISCUSSED:   SPECIAL INSTRUCTIONS/FOLLOW-UP:  See above.  I acknowledge that I have been informed and understand all the instructions given to me and received a copy. I do not have any more questions at this time, but understand that I may call the [facility] at [phone] during business hours should I have any further questions or need assistance in obtaining follow-up care.

## 2011-12-20 NOTE — Progress Notes (Signed)
This office note has been dictated.

## 2011-12-20 NOTE — Telephone Encounter (Signed)
Changed chemo class and email to mw to move tx appt to7/11 and pt aware that i will call with her appt s   aom

## 2011-12-20 NOTE — Progress Notes (Signed)
CC:   Haywood Lasso, M.D. Modena Jansky. Marisue Humble, M.D.  IDENTIFYING STATEMENT:  The patient is an 78 year old woman with follicular lymphoma who presents for followup.  INTERIM HISTORY:  The patient has declined a bone marrow biopsy, with aspiration.  A CT scan and PET scan have been scheduled for this afternoon and next week respectively.  Hepatitis panel prior to Rituxan was unremarkable.  She has yet to attend chemotherapy class.  She is also asking to have her infusion delayed to July.  She feels well but she is under a little bit of stress.  She is moving.  MEDICATIONS:  Reviewed and updated.  ALLERGIES:  None.  PHYSICAL EXAMINATION:  General:  Alert and oriented x3.  Vitals:  Pulse 63, blood pressure 140/38, temperature 97, respirations 20, weight 111.7 pounds.  HEENT:  Mouth moist.  Neck:  Supple.  Chest:  Clear.  Abdomen: Soft.  Extremities:  No edema.  LABORATORY DATA:  On 12/11/2011 white cell count 4.9, hemoglobin 12.6, hematocrit 37.5, platelets 156.  Sodium 138, potassium 3.4, chloride 98, CO2 30, BUN 26, creatinine 1.23, glucose 88, T bilirubin 0.5, alkaline phosphatase 50, AST 25, ALT 14.  As noted above hepatitis panel negative.  IMPRESSION AND PLAN:  Ms. Chhim is an 78 year old woman with a [id number] positive follicular lymphoma.  Awaiting completion of staging CT/PET scan.  The patient declined a bone marrow exam.  The patient has requested we reschedule Rituxan treatments for 01/30/2012.  She plans to attend chemotherapy class before therapy.    ______________________________ Nira Retort, M.D. LIO/MEDQ  D:  12/20/2011  T:  12/20/2011  Job:  [id number]

## 2011-12-20 NOTE — Telephone Encounter (Signed)
Per staff message from [place], I have scheduled appts.  JMW

## 2011-12-23 ENCOUNTER — Telehealth: Payer: Self-pay | Admitting: Hematology and Oncology

## 2011-12-23 ENCOUNTER — Encounter (HOSPITAL_COMMUNITY)
Admission: RE | Admit: 2011-12-23 | Discharge: 2011-12-23 | Disposition: A | Discharge status: Home / Self Care | Payer: Medicare Other | Source: Ambulatory Visit | Attending: Hematology and Oncology | Admitting: Hematology and Oncology

## 2011-12-23 ENCOUNTER — Encounter: Payer: Self-pay | Admitting: Hematology and Oncology

## 2011-12-23 ENCOUNTER — Other Ambulatory Visit: Payer: Medicare Other

## 2011-12-23 DIAGNOSIS — C8299 Follicular lymphoma, unspecified, extranodal and solid organ sites: Secondary | ICD-10-CM | POA: Insufficient documentation

## 2011-12-23 DIAGNOSIS — R945 Abnormal results of liver function studies: Secondary | ICD-10-CM

## 2011-12-23 DIAGNOSIS — Y849 Medical procedure, unspecified as the cause of abnormal reaction of the patient, or of later complication, without mention of misadventure at the time of the procedure: Secondary | ICD-10-CM | POA: Insufficient documentation

## 2011-12-23 DIAGNOSIS — IMO0002 Reserved for concepts with insufficient information to code with codable children: Secondary | ICD-10-CM | POA: Insufficient documentation

## 2011-12-23 DIAGNOSIS — R599 Enlarged lymph nodes, unspecified: Secondary | ICD-10-CM | POA: Insufficient documentation

## 2011-12-23 DIAGNOSIS — C8290 Follicular lymphoma, unspecified, unspecified site: Secondary | ICD-10-CM

## 2011-12-23 MED ORDER — FLUDEOXYGLUCOSE F - 18 (FDG) INJECTION
16.0000 | Freq: Once | INTRAVENOUS | Status: AC | PRN
  Administered 2011-12-23: 16 via INTRAVENOUS

## 2011-12-23 NOTE — Telephone Encounter (Signed)
l/m with appts for chemo and chemo class

## 2011-12-23 NOTE — Progress Notes (Signed)
Patient call me today to let me know that she is going to need some financial assistance with her chemo drug.I told her that I am going to try and help her on needy med,she said oh kay.

## 2011-12-25 ENCOUNTER — Telehealth: Payer: Self-pay | Admitting: *Deleted

## 2011-12-25 ENCOUNTER — Encounter: Payer: Self-pay | Admitting: Hematology and Oncology

## 2011-12-25 NOTE — Telephone Encounter (Signed)
Pt called wanting to know results of PET scan. Pt's   Phone     [phone].

## 2011-12-25 NOTE — Progress Notes (Signed)
I fax patient information to [facility] for co-pay assistance for Rituxan ,phone [phone] and [phone].

## 2011-12-26 ENCOUNTER — Telehealth: Payer: Self-pay | Admitting: *Deleted

## 2011-12-26 ENCOUNTER — Ambulatory Visit: Payer: Medicare Other

## 2011-12-26 NOTE — Telephone Encounter (Signed)
Called pt and informed her that PET scan results showed consistent with low grade lymphoma  As per md's instructions.   Pt stated she still would like to receive chemo treatment in July as planned.

## 2012-01-02 ENCOUNTER — Ambulatory Visit: Payer: Medicare Other

## 2012-01-09 ENCOUNTER — Ambulatory Visit: Payer: Medicare Other

## 2012-01-09 ENCOUNTER — Encounter (INDEPENDENT_AMBULATORY_CARE_PROVIDER_SITE_OTHER): Payer: Self-pay

## 2012-01-14 ENCOUNTER — Encounter: Payer: Self-pay | Admitting: Hematology and Oncology

## 2012-01-14 NOTE — Progress Notes (Signed)
[name] brought me her leukemia and lymphoma form to fill out so she can receive a grant to help with her chemo bills.  She expressed how concerned she is about her 20% after medicare pays.  I asked her if she had seen a Development worker, community, she said she did but was only offered copay assistance for rituxan.  She said she told the [facility] when her bills get too high she will try to set up payment arrangements.  I explained to her that there is assistance available to her.  I helped her fill out an EPP application, she will bring me her proof of income 01/15/12.

## 2012-01-15 ENCOUNTER — Encounter: Payer: Self-pay | Admitting: Hematology and Oncology

## 2012-01-15 NOTE — Progress Notes (Signed)
Faxed form back to the Leukemia & Lymphoma @ [phone].

## 2012-01-15 NOTE — Progress Notes (Signed)
Received Epp application for financial assistance. Based on information provided [name] should qualify for 100% ind. Forward to [employer] to process.

## 2012-01-16 ENCOUNTER — Ambulatory Visit: Payer: Medicare Other

## 2012-01-20 ENCOUNTER — Encounter: Payer: Self-pay | Admitting: Hematology and Oncology

## 2012-01-20 ENCOUNTER — Other Ambulatory Visit: Payer: Medicare Other

## 2012-01-20 NOTE — Progress Notes (Signed)
I was able to get in touch with [name] to let her know that her discount is going to be 95% instead of a 100%,she said that's fine,her income 14,940.00 for a family of one 01/20/12 - 07/22/12,I alsoI check on her application for co-pay assistance for rituximab and the  Foundation said that they would mail her a package out within the next two weeks,and I did tell her that when I talk to her.

## 2012-01-20 NOTE — Progress Notes (Signed)
Patient stop by [name] office this morning to see if she would qualify for our epp discount,and also she did let us know that she is going to be starting on rituximab.I have already started the co-pay assistance for that drug for her.

## 2012-01-24 ENCOUNTER — Ambulatory Visit: Payer: Medicare Other

## 2012-01-29 ENCOUNTER — Telehealth: Payer: Self-pay | Admitting: *Deleted

## 2012-01-29 NOTE — Telephone Encounter (Signed)
Patient called and left a message regarding her appt for tomorrow. I have called her back and left her a message to call me. JMW

## 2012-01-30 ENCOUNTER — Encounter: Payer: Self-pay | Admitting: *Deleted

## 2012-01-30 ENCOUNTER — Ambulatory Visit (HOSPITAL_BASED_OUTPATIENT_CLINIC_OR_DEPARTMENT_OTHER): Payer: Medicare Other

## 2012-01-30 VITALS — BP 121/59 | HR 55 | Temp 97.9°F

## 2012-01-30 DIAGNOSIS — R591 Generalized enlarged lymph nodes: Secondary | ICD-10-CM

## 2012-01-30 DIAGNOSIS — C8290 Follicular lymphoma, unspecified, unspecified site: Secondary | ICD-10-CM

## 2012-01-30 DIAGNOSIS — Z5112 Encounter for antineoplastic immunotherapy: Secondary | ICD-10-CM

## 2012-01-30 DIAGNOSIS — C8299 Follicular lymphoma, unspecified, extranodal and solid organ sites: Secondary | ICD-10-CM

## 2012-01-30 MED ORDER — DIPHENHYDRAMINE HCL 25 MG PO CAPS
50.0000 mg | ORAL_CAPSULE | Freq: Once | ORAL | Status: AC
  Administered 2012-01-30: 50 mg via ORAL

## 2012-01-30 MED ORDER — SODIUM CHLORIDE 0.9 % IV SOLN
Freq: Once | INTRAVENOUS | Status: AC
  Administered 2012-01-30: 09:00:00 via INTRAVENOUS

## 2012-01-30 MED ORDER — SODIUM CHLORIDE 0.9 % IV SOLN
375.0000 mg/m2 | Freq: Once | INTRAVENOUS | Status: AC
  Administered 2012-01-30: 600 mg via INTRAVENOUS
  Filled 2012-01-30: qty 60

## 2012-01-30 MED ORDER — ACETAMINOPHEN 325 MG PO TABS
650.0000 mg | ORAL_TABLET | Freq: Once | ORAL | Status: AC
  Administered 2012-01-30: 650 mg via ORAL

## 2012-01-30 NOTE — Patient Instructions (Addendum)
[place] Discharge Instructions for Patients Receiving Chemotherapy  Today you received the following chemotherapy agents Rituxan.  To help prevent nausea and vomiting after your treatment, we encourage you to take your nausea medication as ordered per MD.    If you develop nausea and vomiting that is not controlled by your nausea medication, call the clinic. If it is after clinic hours your family physician or the after hours number for the clinic or go to the Emergency Department.   BELOW ARE SYMPTOMS THAT SHOULD BE REPORTED IMMEDIATELY:  *FEVER GREATER THAN 100.5 F  *CHILLS WITH OR WITHOUT FEVER  NAUSEA AND VOMITING THAT IS NOT CONTROLLED WITH YOUR NAUSEA MEDICATION  *UNUSUAL SHORTNESS OF BREATH  *UNUSUAL BRUISING OR BLEEDING  TENDERNESS IN MOUTH AND THROAT WITH OR WITHOUT PRESENCE OF ULCERS  *URINARY PROBLEMS  *BOWEL PROBLEMS  UNUSUAL RASH Items with * indicate a potential emergency and should be followed up as soon as possible.  One of the nurses will contact you 24 hours after your treatment. Please let the nurse know about any problems that you may have experienced. Feel free to call the clinic you have any questions or concerns. The clinic phone number is [phone].   I have been informed and understand all the instructions given to me. I know to contact the clinic, my physician, or go to the Emergency Department if any problems should occur. I do not have any questions at this time, but understand that I may call the clinic during office hours   should I have any questions or need assistance in obtaining follow up care.    __________________________________________  _____________  __________ Signature of Patient or Authorized Representative            Date                   Time    __________________________________________ Nurse's Signature

## 2012-01-31 ENCOUNTER — Telehealth: Payer: Self-pay | Admitting: *Deleted

## 2012-01-31 NOTE — Telephone Encounter (Signed)
Spoke with [name].  Says she is tired.  After yesterday's treatment she went home and at bedtime, went directly to bed and slept all night.  Reports feeling like a foreign substance is in her body.  And she is just tired.   Adds that at the end of the treatment yesterday she felt like her lips were swelling and experienced itching.  Denies any body aches, chest pain or trouble swallowing and throat problems at this time.  Assured me she is fine but this nurse reviewed how to call on call provider in the event of any emergencies.  She does have tylenol and benadryl on hand in her home if needed.

## 2012-01-31 NOTE — Telephone Encounter (Signed)
Message copied by Cherylynn Ridges on Fri Jan 31, 2012 11:07 AM ------      Message from: [place]      Created: Thu Jan 30, 2012  9:35 AM      Regarding: Chemo f/u call      Contact: [phone]       First time Rituxan-Dr. Odogwu.

## 2012-02-05 ENCOUNTER — Encounter: Payer: Self-pay | Admitting: Pharmacist

## 2012-02-06 ENCOUNTER — Ambulatory Visit (HOSPITAL_BASED_OUTPATIENT_CLINIC_OR_DEPARTMENT_OTHER): Payer: Medicare Other

## 2012-02-06 VITALS — BP 151/73 | HR 47 | Temp 98.3°F

## 2012-02-06 DIAGNOSIS — C8290 Follicular lymphoma, unspecified, unspecified site: Secondary | ICD-10-CM

## 2012-02-06 DIAGNOSIS — Z5112 Encounter for antineoplastic immunotherapy: Secondary | ICD-10-CM

## 2012-02-06 DIAGNOSIS — C8299 Follicular lymphoma, unspecified, extranodal and solid organ sites: Secondary | ICD-10-CM

## 2012-02-06 DIAGNOSIS — R591 Generalized enlarged lymph nodes: Secondary | ICD-10-CM

## 2012-02-06 MED ORDER — ACETAMINOPHEN 325 MG PO TABS
650.0000 mg | ORAL_TABLET | Freq: Once | ORAL | Status: AC
  Administered 2012-02-06: 650 mg via ORAL

## 2012-02-06 MED ORDER — DIPHENHYDRAMINE HCL 25 MG PO CAPS
50.0000 mg | ORAL_CAPSULE | Freq: Once | ORAL | Status: AC
  Administered 2012-02-06: 50 mg via ORAL

## 2012-02-06 MED ORDER — SODIUM CHLORIDE 0.9 % IV SOLN
Freq: Once | INTRAVENOUS | Status: AC
  Administered 2012-02-06: 09:00:00 via INTRAVENOUS

## 2012-02-06 MED ORDER — SODIUM CHLORIDE 0.9 % IV SOLN
375.0000 mg/m2 | Freq: Once | INTRAVENOUS | Status: AC
  Administered 2012-02-06: 600 mg via INTRAVENOUS
  Filled 2012-02-06: qty 60

## 2012-02-06 NOTE — Patient Instructions (Signed)
[facility] Discharge Instructions for Patients Receiving Chemotherapy  Today you received the following chemotherapy agents Rituxan   To help prevent nausea and vomiting after your treatment, we encourage you to take your nausea medication as prescribed If you develop nausea and vomiting that is not controlled by your nausea medication, call the clinic. If it is after clinic hours your family physician or the after hours number for the clinic or go to the Emergency Department.   BELOW ARE SYMPTOMS THAT SHOULD BE REPORTED IMMEDIATELY:  *FEVER GREATER THAN 100.5 F  *CHILLS WITH OR WITHOUT FEVER  NAUSEA AND VOMITING THAT IS NOT CONTROLLED WITH YOUR NAUSEA MEDICATION  *UNUSUAL SHORTNESS OF BREATH  *UNUSUAL BRUISING OR BLEEDING  TENDERNESS IN MOUTH AND THROAT WITH OR WITHOUT PRESENCE OF ULCERS  *URINARY PROBLEMS  *BOWEL PROBLEMS  UNUSUAL RASH Items with * indicate a potential emergency and should be followed up as soon as possible.  One of the nurses will contact you 24 hours after your treatment. Please let the nurse know about any problems that you may have experienced. Feel free to call the clinic you have any questions or concerns. The clinic phone number is [phone].   I have been informed and understand all the instructions given to me. I know to contact the clinic, my physician, or go to the Emergency Department if any problems should occur. I do not have any questions at this time, but understand that I may call the clinic during office hours   should I have any questions or need assistance in obtaining follow up care.    __________________________________________  _____________  __________ Signature of Patient or Authorized Representative            Date                   Time    __________________________________________ Nurse's Signature    

## 2012-02-13 ENCOUNTER — Ambulatory Visit (HOSPITAL_BASED_OUTPATIENT_CLINIC_OR_DEPARTMENT_OTHER): Payer: Medicare Other

## 2012-02-13 VITALS — BP 148/66 | HR 54 | Temp 97.0°F

## 2012-02-13 DIAGNOSIS — Z5112 Encounter for antineoplastic immunotherapy: Secondary | ICD-10-CM

## 2012-02-13 DIAGNOSIS — R591 Generalized enlarged lymph nodes: Secondary | ICD-10-CM

## 2012-02-13 DIAGNOSIS — C8299 Follicular lymphoma, unspecified, extranodal and solid organ sites: Secondary | ICD-10-CM

## 2012-02-13 DIAGNOSIS — C8290 Follicular lymphoma, unspecified, unspecified site: Secondary | ICD-10-CM

## 2012-02-13 MED ORDER — ACETAMINOPHEN 325 MG PO TABS
650.0000 mg | ORAL_TABLET | Freq: Once | ORAL | Status: AC
  Administered 2012-02-13: 650 mg via ORAL

## 2012-02-13 MED ORDER — DIPHENHYDRAMINE HCL 25 MG PO CAPS
50.0000 mg | ORAL_CAPSULE | Freq: Once | ORAL | Status: AC
  Administered 2012-02-13: 50 mg via ORAL

## 2012-02-13 MED ORDER — SODIUM CHLORIDE 0.9 % IV SOLN
375.0000 mg/m2 | Freq: Once | INTRAVENOUS | Status: AC
  Administered 2012-02-13: 600 mg via INTRAVENOUS
  Filled 2012-02-13: qty 60

## 2012-02-13 MED ORDER — SODIUM CHLORIDE 0.9 % IV SOLN
Freq: Once | INTRAVENOUS | Status: AC
  Administered 2012-02-13: 09:00:00 via INTRAVENOUS

## 2012-02-13 NOTE — Patient Instructions (Addendum)
[facility] Discharge Instructions for Patients Receiving Chemotherapy  Today you received the following chemotherapy agents Rituxan   To help prevent nausea and vomiting after your treatment, we encourage you to take your nausea medication as prescribed If you develop nausea and vomiting that is not controlled by your nausea medication, call the clinic. If it is after clinic hours your family physician or the after hours number for the clinic or go to the Emergency Department.   BELOW ARE SYMPTOMS THAT SHOULD BE REPORTED IMMEDIATELY:  *FEVER GREATER THAN 100.5 F  *CHILLS WITH OR WITHOUT FEVER  NAUSEA AND VOMITING THAT IS NOT CONTROLLED WITH YOUR NAUSEA MEDICATION  *UNUSUAL SHORTNESS OF BREATH  *UNUSUAL BRUISING OR BLEEDING  TENDERNESS IN MOUTH AND THROAT WITH OR WITHOUT PRESENCE OF ULCERS  *URINARY PROBLEMS  *BOWEL PROBLEMS  UNUSUAL RASH Items with * indicate a potential emergency and should be followed up as soon as possible.  One of the nurses will contact you 24 hours after your treatment. Please let the nurse know about any problems that you may have experienced. Feel free to call the clinic you have any questions or concerns. The clinic phone number is [phone].   I have been informed and understand all the instructions given to me. I know to contact the clinic, my physician, or go to the Emergency Department if any problems should occur. I do not have any questions at this time, but understand that I may call the clinic during office hours   should I have any questions or need assistance in obtaining follow up care.    __________________________________________  _____________  __________ Signature of Patient or Authorized Representative            Date                   Time    __________________________________________ Nurse's Signature    

## 2012-02-19 ENCOUNTER — Other Ambulatory Visit: Payer: Self-pay | Admitting: Hematology and Oncology

## 2012-02-19 DIAGNOSIS — C8590 Non-Hodgkin lymphoma, unspecified, unspecified site: Secondary | ICD-10-CM

## 2012-02-20 ENCOUNTER — Ambulatory Visit (HOSPITAL_BASED_OUTPATIENT_CLINIC_OR_DEPARTMENT_OTHER): Payer: Medicare Other

## 2012-02-20 ENCOUNTER — Ambulatory Visit (HOSPITAL_BASED_OUTPATIENT_CLINIC_OR_DEPARTMENT_OTHER): Payer: Medicare Other | Admitting: Lab

## 2012-02-20 ENCOUNTER — Other Ambulatory Visit: Payer: Self-pay | Admitting: *Deleted

## 2012-02-20 VITALS — BP 167/80 | HR 53 | Temp 95.2°F

## 2012-02-20 DIAGNOSIS — C8299 Follicular lymphoma, unspecified, extranodal and solid organ sites: Secondary | ICD-10-CM

## 2012-02-20 DIAGNOSIS — Z5112 Encounter for antineoplastic immunotherapy: Secondary | ICD-10-CM

## 2012-02-20 DIAGNOSIS — C8290 Follicular lymphoma, unspecified, unspecified site: Secondary | ICD-10-CM

## 2012-02-20 DIAGNOSIS — R591 Generalized enlarged lymph nodes: Secondary | ICD-10-CM

## 2012-02-20 LAB — CBC WITH DIFFERENTIAL/PLATELET
BASO%: 1 % (ref 0.0–2.0)
Basophils Absolute: 0 10*3/uL (ref 0.0–0.1)
EOS%: 2.9 % (ref 0.0–7.0)
HCT: 34.4 % — ABNORMAL LOW (ref 34.8–46.6)
HGB: 11.5 g/dL — ABNORMAL LOW (ref 11.6–15.9)
LYMPH%: 28.9 % (ref 14.0–49.7)
MCH: 32.7 pg (ref 25.1–34.0)
MCHC: 33.4 g/dL (ref 31.5–36.0)
NEUT%: 53.5 % (ref 38.4–76.8)
Platelets: 148 10*3/uL (ref 145–400)

## 2012-02-20 MED ORDER — SODIUM CHLORIDE 0.9 % IJ SOLN
3.0000 mL | INTRAMUSCULAR | Status: DC | PRN
  Filled 2012-02-20: qty 10

## 2012-02-20 MED ORDER — ALBUTEROL SULFATE (2.5 MG/3ML) 0.083% IN NEBU
2.5000 mg | INHALATION_SOLUTION | Freq: Once | RESPIRATORY_TRACT | Status: DC | PRN
  Filled 2012-02-20: qty 3

## 2012-02-20 MED ORDER — DIPHENHYDRAMINE HCL 25 MG PO CAPS
50.0000 mg | ORAL_CAPSULE | Freq: Once | ORAL | Status: AC
  Administered 2012-02-20: 50 mg via ORAL

## 2012-02-20 MED ORDER — SODIUM CHLORIDE 0.9 % IV SOLN
375.0000 mg/m2 | Freq: Once | INTRAVENOUS | Status: AC
  Administered 2012-02-20: 600 mg via INTRAVENOUS
  Filled 2012-02-20: qty 60

## 2012-02-20 MED ORDER — DIPHENHYDRAMINE HCL 50 MG/ML IJ SOLN: 50.0000 mg | Freq: Once | INTRAMUSCULAR | Status: DC | PRN

## 2012-02-20 MED ORDER — EPINEPHRINE HCL 0.1 MG/ML IJ SOLN
0.2500 mg | Freq: Once | INTRAMUSCULAR | Status: DC | PRN
  Filled 2012-02-20: qty 10

## 2012-02-20 MED ORDER — ALTEPLASE 2 MG IJ SOLR
2.0000 mg | Freq: Once | INTRAMUSCULAR | Status: DC | PRN
  Filled 2012-02-20: qty 2

## 2012-02-20 MED ORDER — SODIUM CHLORIDE 0.9 % IV SOLN: Freq: Once | INTRAVENOUS | Status: DC | PRN

## 2012-02-20 MED ORDER — SODIUM CHLORIDE 0.9 % IJ SOLN
10.0000 mL | INTRAMUSCULAR | Status: DC | PRN
  Filled 2012-02-20: qty 10

## 2012-02-20 MED ORDER — SODIUM CHLORIDE 0.9 % IV SOLN
Freq: Once | INTRAVENOUS | Status: AC
  Administered 2012-02-20: 09:00:00 via INTRAVENOUS

## 2012-02-20 MED ORDER — HEPARIN SOD (PORK) LOCK FLUSH 100 UNIT/ML IV SOLN
500.0000 [IU] | Freq: Once | INTRAVENOUS | Status: DC | PRN
  Filled 2012-02-20: qty 5

## 2012-02-20 MED ORDER — METHYLPREDNISOLONE SODIUM SUCC 125 MG IJ SOLR: 125.0000 mg | Freq: Once | INTRAMUSCULAR | Status: DC | PRN

## 2012-02-20 MED ORDER — DIPHENHYDRAMINE HCL 50 MG/ML IJ SOLN: 25.0000 mg | Freq: Once | INTRAMUSCULAR | Status: DC | PRN

## 2012-02-20 MED ORDER — ACETAMINOPHEN 325 MG PO TABS
650.0000 mg | ORAL_TABLET | Freq: Once | ORAL | Status: AC
  Administered 2012-02-20: 650 mg via ORAL

## 2012-02-20 MED ORDER — HEPARIN SOD (PORK) LOCK FLUSH 100 UNIT/ML IV SOLN
250.0000 [IU] | Freq: Once | INTRAVENOUS | Status: DC | PRN
  Filled 2012-02-20: qty 5

## 2012-02-20 NOTE — Patient Instructions (Addendum)
[facility] Discharge Instructions for Patients Receiving Chemotherapy  Today you received the following chemotherapy agents Rixtuxan  To help prevent nausea and vomiting after your treatment, we encourage you to take your nausea medication Begin taking it at 7 pm and take it as often as prescribed for the next 24 to 72 hours.   If you develop nausea and vomiting that is not controlled by your nausea medication, call the clinic. If it is after clinic hours your family physician or the after hours number for the clinic or go to the Emergency Department.   BELOW ARE SYMPTOMS THAT SHOULD BE REPORTED IMMEDIATELY:  *FEVER GREATER THAN 100.5 F  *CHILLS WITH OR WITHOUT FEVER  NAUSEA AND VOMITING THAT IS NOT CONTROLLED WITH YOUR NAUSEA MEDICATION  *UNUSUAL SHORTNESS OF BREATH  *UNUSUAL BRUISING OR BLEEDING  TENDERNESS IN MOUTH AND THROAT WITH OR WITHOUT PRESENCE OF ULCERS  *URINARY PROBLEMS  *BOWEL PROBLEMS  UNUSUAL RASH Items with * indicate a potential emergency and should be followed up as soon as possible.  One of the nurses will contact you 24 hours after your treatment. Please let the nurse know about any problems that you may have experienced. Feel free to call the clinic you have any questions or concerns. The clinic phone number is [phone].   I have been informed and understand all the instructions given to me. I know to contact the clinic, my physician, or go to the Emergency Department if any problems should occur. I do not have any questions at this time, but understand that I may call the clinic during office hours   should I have any questions or need assistance in obtaining follow up care.    __________________________________________  _____________  __________ Signature of Patient or Authorized Representative            Date                   Time    __________________________________________ Nurse's Signature

## 2012-02-25 ENCOUNTER — Telehealth: Payer: Self-pay | Admitting: Hematology and Oncology

## 2012-02-25 NOTE — Telephone Encounter (Signed)
S/w pt re lb/pet for 9/5 and LO for 9/10.

## 2012-03-10 ENCOUNTER — Other Ambulatory Visit: Payer: Self-pay | Admitting: Family Medicine

## 2012-03-10 DIAGNOSIS — Z1231 Encounter for screening mammogram for malignant neoplasm of breast: Secondary | ICD-10-CM

## 2012-03-26 ENCOUNTER — Encounter (HOSPITAL_COMMUNITY)
Admission: RE | Admit: 2012-03-26 | Discharge: 2012-03-26 | Disposition: A | Discharge status: Home / Self Care | Payer: Medicare Other | Source: Ambulatory Visit | Attending: Hematology and Oncology | Admitting: Hematology and Oncology

## 2012-03-26 ENCOUNTER — Other Ambulatory Visit (HOSPITAL_BASED_OUTPATIENT_CLINIC_OR_DEPARTMENT_OTHER): Payer: Medicare Other | Admitting: Lab

## 2012-03-26 DIAGNOSIS — C8590 Non-Hodgkin lymphoma, unspecified, unspecified site: Secondary | ICD-10-CM

## 2012-03-26 DIAGNOSIS — C8299 Follicular lymphoma, unspecified, extranodal and solid organ sites: Secondary | ICD-10-CM

## 2012-03-26 DIAGNOSIS — R599 Enlarged lymph nodes, unspecified: Secondary | ICD-10-CM | POA: Insufficient documentation

## 2012-03-26 LAB — CBC WITH DIFFERENTIAL/PLATELET
BASO%: 0.9 % (ref 0.0–2.0)
Basophils Absolute: 0 10*3/uL (ref 0.0–0.1)
EOS%: 2.9 % (ref 0.0–7.0)
HCT: 35.1 % (ref 34.8–46.6)
MCH: 33.2 pg (ref 25.1–34.0)
MCHC: 33.7 g/dL (ref 31.5–36.0)
MCV: 98.5 fL (ref 79.5–101.0)
MONO%: 12.3 % (ref 0.0–14.0)
NEUT%: 57.2 % (ref 38.4–76.8)
RDW: 13.2 % (ref 11.2–14.5)
lymph#: 0.9 10*3/uL (ref 0.9–3.3)

## 2012-03-26 LAB — COMPREHENSIVE METABOLIC PANEL (CC13)
ALT: 15 U/L (ref 0–55)
AST: 26 U/L (ref 5–34)
Alkaline Phosphatase: 51 U/L (ref 40–150)
BUN: 26 mg/dL (ref 7.0–26.0)
Calcium: 9.6 mg/dL (ref 8.4–10.4)
Chloride: 103 mEq/L (ref 98–107)
Creatinine: 1.1 mg/dL (ref 0.6–1.1)
Total Bilirubin: 0.7 mg/dL (ref 0.20–1.20)

## 2012-03-26 MED ORDER — FLUDEOXYGLUCOSE F - 18 (FDG) INJECTION
16.4000 | Freq: Once | INTRAVENOUS | Status: AC | PRN
  Administered 2012-03-26: 16.4 via INTRAVENOUS

## 2012-03-27 LAB — GLUCOSE, CAPILLARY: Glucose-Capillary: 79 mg/dL (ref 70–99)

## 2012-03-31 ENCOUNTER — Telehealth: Payer: Self-pay | Admitting: Hematology and Oncology

## 2012-03-31 ENCOUNTER — Ambulatory Visit (HOSPITAL_BASED_OUTPATIENT_CLINIC_OR_DEPARTMENT_OTHER): Payer: Medicare Other | Admitting: Hematology and Oncology

## 2012-03-31 ENCOUNTER — Encounter: Payer: Self-pay | Admitting: Hematology and Oncology

## 2012-03-31 ENCOUNTER — Other Ambulatory Visit: Payer: Medicare Other | Admitting: Lab

## 2012-03-31 VITALS — BP 168/89 | HR 59 | Temp 97.0°F | Resp 18 | Ht 64.0 in | Wt 109.9 lb

## 2012-03-31 DIAGNOSIS — C8290 Follicular lymphoma, unspecified, unspecified site: Secondary | ICD-10-CM

## 2012-03-31 DIAGNOSIS — C8299 Follicular lymphoma, unspecified, extranodal and solid organ sites: Secondary | ICD-10-CM

## 2012-03-31 NOTE — Telephone Encounter (Signed)
apts made and printed for pt.       sed

## 2012-03-31 NOTE — Patient Instructions (Addendum)
Jeanifer Minta Balsam  [id number]   [facility] - AFTER VISIT SUMMARY   **RECOMMENDATIONS MADE BY THE CONSULTANT AND ANY TEST    RESULTS WILL BE SENT TO YOUR REFERRING DOCTORS.   YOUR EXAM FINDINGS, LABS AND RESULTS WERE DISCUSSED BY YOUR MD TODAY.    Your Updated drug allergies are: Allergies as of 03/31/2012 - Review Complete 03/31/2012  Allergen Reaction Noted  . Amoxicillin Diarrhea and Nausea And Vomiting 10/23/2011  . Aspirin Nausea And Vomiting and Other (See Comments) 10/23/2011  . Benadryl (diphenhydramine hcl) Nausea And Vomiting 10/23/2011  . Ibuprofen Nausea And Vomiting and Palpitations 10/23/2011  . Sulfa antibiotics Itching and Nausea And Vomiting 10/23/2011  . Sulindac  10/23/2011  . Penicillins Rash 10/23/2011    Your current list of medications are: Current Outpatient Prescriptions  Medication Sig Dispense Refill  . acetaminophen (TYLENOL) 650 MG CR tablet Take 650 mg by mouth every 8 (eight) hours as needed.      Marland Kitchen atenolol (TENORMIN) 50 MG tablet Take 50 mg by mouth daily.      . Calcium Carbonate-Vitamin D (CALCIUM-D) 600-400 MG-UNIT TABS Take by mouth daily.       . chlordiazePOXIDE (LIBRIUM) 10 MG capsule Take 10 mg by mouth 2 (two) times daily as needed.      Marland Kitchen dextromethorphan-guaiFENesin (MUCINEX DM) 30-600 MG per 12 hr tablet Take 1 tablet by mouth as needed.      . fluticasone (FLONASE) 50 MCG/ACT nasal spray Place 2 sprays into the nose daily as needed.       . hydrochlorothiazide (HYDRODIURIL) 25 MG tablet Take 25 mg by mouth daily.      Marland Kitchen HYDROcodone-acetaminophen (NORCO) 5-325 MG per tablet Take 1 tablet by mouth every 6 (six) hours as needed.      . hyoscyamine (LEVSIN SL) 0.125 MG SL tablet Place 0.125 mg under the tongue every 4 (four) hours as needed.      . meclizine (ANTIVERT) 25 MG tablet Take 25 mg by mouth 3 (three) times daily as needed.      . Multiple Vitamin (MULTIVITAMIN) capsule Take 1 capsule by mouth daily.      .  nabumetone (RELAFEN) 750 MG tablet Take 750 mg by mouth 2 (two) times daily.      Marland Kitchen Propylene Glycol (SYSTANE BALANCE) 0.6 % SOLN Apply to eye.      . ranitidine (ZANTAC) 300 MG tablet Take 300 mg by mouth at bedtime as needed.       . sertraline (ZOLOFT) 50 MG tablet Take 50 mg by mouth daily.         INSTRUCTIONS GIVEN AND DISCUSSED:  See attached schedule   SPECIAL INSTRUCTIONS/FOLLOW-UP:  See above.  I acknowledge that I have been informed and understand all the instructions given to me and received a copy. I do not have any more questions at this time, but understand that I may call the [facility] at [phone] during business hours should I have any further questions or need assistance in obtaining follow-up care.

## 2012-03-31 NOTE — Progress Notes (Signed)
CC:   Modena Jansky. Marisue Humble, M.D.  IDENTIFYING STATEMENT:  The patient is an 78 year old woman with follicular lymphoma, who presents for followup.  INTERVAL HISTORY:  The patient tolerated weekly Rituxan.  Had no complaints whatsoever.  Currently has no complaints, specifically denying fever, chills, night sweats, anorexia or weight loss.  Has not noted any adenopathy.  We reviewed results of her recent PET scan.  That essentially showed mildly-enlarged bilateral axillary lymph nodes with very minimal hypermetabolic activity.  In summary, PET scan did show a complete metabolic response to therapy with only 1 focus of very mild hypermetabolic activity in the right axilla.  MEDICATIONS:  Reviewed and updated.  ALLERGIES:  None.  Past medical history, family history and social history unchanged.  REVIEW OF SYSTEMS:  Ten-point review of systems negative.  PHYSICAL EXAMINATION:  The patient is alert and oriented x3.  Vitals: Pulse 59, blood pressure 168/89, temperature 97, respirations 18, weight 109.9 pounds.  HEENT:  Head is atraumatic, normocephalic.  Sclerae anicteric.  Mouth moist.  Chest and CVS unremarkable.  Abdomen:  Soft, nontender.  Bowel sounds present.  Extremities:  No calf tenderness. Lymph nodes:  No palpable cervical, axillary or inguinal adenopathy.  LABORATORY DATA:  03/26/2012:  White cell count 3.3, hemoglobin 11.8, hematocrit 35.1, platelets 134.  Sodium 140, potassium 4, chloride 103, CO2 27, BUN 26, creatinine 1.1, glucose 84.  T-bili 0.7.  Alkaline phosphatase 51, AST 26, ALT 15, calcium 9.6.  LDH 150.  IMPRESSION/PLAN:  Ms. Suchanek is an 78 year old woman with [id number] follicular lymphoma.  Completed 4 weekly treatments of Rituxan from 01/30/2012 through 02/20/2012.  A followup A PET scan favors a complete metabolic response to therapy with mild adenopathy in both axillae, otherwise rest of exam was remarkable.  We discussed the need for maintenance  therapy with Rituxan scheduled every 2 months for a year to which would increase time to progression. The patient declined.  She prefers to forego maintenance therapy and continue surveillance.  I will have her return in 4 months' time with a CT scan of the neck, chest, abdomen and pelvis.  She was reminded to receive her fall flu shot.    ______________________________ Nira Retort, M.D. LIO/MEDQ  D:  03/31/2012  T:  03/31/2012  Job:  [id number]

## 2012-03-31 NOTE — Progress Notes (Signed)
This office note has been dictated.

## 2012-04-15 ENCOUNTER — Encounter: Payer: Self-pay | Admitting: Dietician

## 2012-04-15 NOTE — Progress Notes (Signed)
Brief Out-patient Oncology Nutrition Note  Reason: Patient Screened Positive For Nutrition Risk For Unintentional Weight Loss and Decreased Appetite.   [name] is an 78 year old patient of Dr. Jamse Arn, diagnosed with lymphoma. Contacted patient via telephone for positive nutrition risk. Patient reported her appetite is ok, she reported it is getting much better. She reported her weight is staying stable even though she is eating more and that she now has more energy. She reported she knows what to eat. She stated she has been drinking a nutrition supplement between meals that she gets from [employer] called orgain. She also stated she knows how to manage her IBS.   Wt Readings from Last 10 Encounters:  03/31/12 109 lb 14.4 oz (49.85 kg)  12/20/11 111 lb 11.2 oz (50.667 kg)  12/12/11 111 lb 3.2 oz (50.44 kg)  12/11/11 112 lb 6.4 oz (50.984 kg)  10/25/11 116 lb 2 oz (52.674 kg)    I have educated the patient on high calorie, high protein diet. We discussed strategies to increase caloric intake. I have encouraged the patient to continue to drink her nutrition supplements between meals. The patient was without any further nutrition related questions. Provided RD contact information and instructed patient to contact RD for future questions.   RD available for nutrition needs.   Loyce Dys, [place], Miguel Barrera, [place]

## 2012-04-20 ENCOUNTER — Ambulatory Visit
Admission: RE | Admit: 2012-04-20 | Discharge: 2012-04-20 | Disposition: A | Discharge status: Home / Self Care | Payer: Medicare Other | Source: Ambulatory Visit | Attending: Family Medicine | Admitting: Family Medicine

## 2012-04-20 DIAGNOSIS — Z1231 Encounter for screening mammogram for malignant neoplasm of breast: Secondary | ICD-10-CM

## 2012-06-09 ENCOUNTER — Other Ambulatory Visit: Payer: Self-pay | Admitting: Dermatology

## 2012-06-30 ENCOUNTER — Encounter: Payer: Self-pay | Admitting: Hematology and Oncology

## 2012-06-30 NOTE — Progress Notes (Signed)
Per last office note pt declined maintenance Rituxan and therefore no need for reauthorization at this time. Last dose 02/20/12

## 2012-07-25 ENCOUNTER — Encounter: Payer: Self-pay | Admitting: *Deleted

## 2012-08-06 ENCOUNTER — Encounter: Payer: Self-pay | Admitting: Oncology

## 2012-08-06 NOTE — Progress Notes (Signed)
Spoke with pt regarding her discount.  It expired on 07/22/12.  I will put an EPP application in the mail for her today to reapply.

## 2012-08-14 ENCOUNTER — Ambulatory Visit (HOSPITAL_COMMUNITY)
Admission: RE | Admit: 2012-08-14 | Discharge: 2012-08-14 | Disposition: A | Discharge status: Home / Self Care | Payer: Medicare Other | Source: Ambulatory Visit | Attending: Hematology and Oncology | Admitting: Hematology and Oncology

## 2012-08-14 ENCOUNTER — Other Ambulatory Visit: Payer: Medicare Other | Admitting: Lab

## 2012-08-14 DIAGNOSIS — C8290 Follicular lymphoma, unspecified, unspecified site: Secondary | ICD-10-CM

## 2012-08-14 DIAGNOSIS — R599 Enlarged lymph nodes, unspecified: Secondary | ICD-10-CM | POA: Insufficient documentation

## 2012-08-14 DIAGNOSIS — C8299 Follicular lymphoma, unspecified, extranodal and solid organ sites: Secondary | ICD-10-CM | POA: Insufficient documentation

## 2012-08-14 DIAGNOSIS — I700 Atherosclerosis of aorta: Secondary | ICD-10-CM | POA: Insufficient documentation

## 2012-08-14 DIAGNOSIS — J479 Bronchiectasis, uncomplicated: Secondary | ICD-10-CM | POA: Insufficient documentation

## 2012-08-14 LAB — CBC WITH DIFFERENTIAL/PLATELET
Basophils Absolute: 0 10*3/uL (ref 0.0–0.1)
EOS%: 3 % (ref 0.0–7.0)
HCT: 35.4 % (ref 34.8–46.6)
HGB: 12.2 g/dL (ref 11.6–15.9)
MCH: 33 pg (ref 25.1–34.0)
MONO#: 0.5 10*3/uL (ref 0.1–0.9)
NEUT#: 3 10*3/uL (ref 1.5–6.5)
NEUT%: 67.4 % (ref 38.4–76.8)
RDW: 12.6 % (ref 11.2–14.5)
WBC: 4.5 10*3/uL (ref 3.9–10.3)
lymph#: 0.8 10*3/uL — ABNORMAL LOW (ref 0.9–3.3)

## 2012-08-14 LAB — COMPREHENSIVE METABOLIC PANEL
AST: 25 U/L (ref 0–37)
Albumin: 3.6 g/dL (ref 3.5–5.2)
Alkaline Phosphatase: 64 U/L (ref 39–117)
Calcium: 10 mg/dL (ref 8.4–10.5)
Chloride: 98 mEq/L (ref 96–112)
Glucose, Bld: 81 mg/dL (ref 70–99)
Potassium: 4 mEq/L (ref 3.5–5.3)
Sodium: 136 mEq/L (ref 135–145)
Total Protein: 8 g/dL (ref 6.0–8.3)

## 2012-08-14 MED ORDER — IOHEXOL 300 MG/ML  SOLN
50.0000 mL | Freq: Once | INTRAMUSCULAR | Status: AC | PRN
  Administered 2012-08-14: 50 mL via ORAL

## 2012-08-14 MED ORDER — IOHEXOL 300 MG/ML  SOLN
100.0000 mL | Freq: Once | INTRAMUSCULAR | Status: AC | PRN
  Administered 2012-08-14: 100 mL via INTRAVENOUS

## 2012-08-16 ENCOUNTER — Other Ambulatory Visit: Payer: Self-pay | Admitting: Oncology

## 2012-08-16 DIAGNOSIS — C8290 Follicular lymphoma, unspecified, unspecified site: Secondary | ICD-10-CM

## 2012-08-17 ENCOUNTER — Telehealth: Payer: Self-pay | Admitting: Oncology

## 2012-08-17 ENCOUNTER — Encounter: Payer: Self-pay | Admitting: Medical Oncology

## 2012-08-17 NOTE — Progress Notes (Signed)
Pt called and is aware that Dr.Odogwu is no longer with our practice. She has labs ordered with her visit with Dr. Ralene Ok on 08/18/12. She states she had labs drawn 08/14/12 with her CT and would like to know if she needs more labs. I told her no and I will cancel her lab appointment. She will see Dr. Ralene Ok at 4 pm and she voiced understanding.

## 2012-08-17 NOTE — Telephone Encounter (Signed)
l/m with lab appt on 1/28    [name]

## 2012-08-18 ENCOUNTER — Other Ambulatory Visit: Payer: Medicare Other | Admitting: Lab

## 2012-08-18 ENCOUNTER — Telehealth: Payer: Self-pay | Admitting: Oncology

## 2012-08-18 ENCOUNTER — Ambulatory Visit: Payer: Medicare Other | Admitting: Hematology and Oncology

## 2012-08-18 ENCOUNTER — Ambulatory Visit: Payer: Self-pay | Admitting: Oncology

## 2012-08-18 NOTE — Telephone Encounter (Signed)
Pt called and moved 1/29 appt to 3/6 due to weather. Pt has new d/t.

## 2012-08-19 ENCOUNTER — Encounter: Payer: Self-pay | Admitting: Oncology

## 2012-08-19 NOTE — Progress Notes (Signed)
CT scan of the chest abdomen and pelvis with IV contrast from 08/14/2012 was reviewed. On the PET scan of 03/26/2012 the right axillary lymph node did not show increased SUV activity. It was felt that the lymph node was borderline enlarged and unchanged from the prior exam, even going back to 12/20/2011.

## 2012-08-20 ENCOUNTER — Encounter: Payer: Self-pay | Admitting: Oncology

## 2012-08-20 NOTE — Progress Notes (Signed)
Pt's financial assistance expired on 07/22/12 so she reapplied.  I explained to her on the phone that we need all pages of her bank statement.  She came in today with only a few pages of the bank statement.  I explained again that we need all the pages.  She got upset and said, "forget it! [facility] isn't getting all of the pages, it's none of their business."  She walked out.  I will discard the application.

## 2012-09-08 ENCOUNTER — Other Ambulatory Visit: Payer: Self-pay | Admitting: Dermatology

## 2012-09-22 ENCOUNTER — Telehealth: Payer: Self-pay | Admitting: Oncology

## 2012-09-22 NOTE — Telephone Encounter (Signed)
appt for 3/6 moved to 3/17. S/w pt she is aware.

## 2012-09-24 ENCOUNTER — Ambulatory Visit: Payer: Self-pay | Admitting: Oncology

## 2012-10-05 ENCOUNTER — Telehealth: Payer: Self-pay | Admitting: *Deleted

## 2012-10-05 ENCOUNTER — Ambulatory Visit: Payer: Self-pay | Admitting: Oncology

## 2012-10-05 NOTE — Telephone Encounter (Signed)
Pt called stating that she has been trying to get to a scheduler for 2 hours and is very shaken up about not getting anyone.  Somehow she got transferred to my phone and I verified her appt for today and she said that she was going to try and make it, but if the weather worsened then she would not and would call again to reschedule, but wanted someone here to know that she would try to call.

## 2012-10-06 ENCOUNTER — Other Ambulatory Visit: Payer: Self-pay

## 2012-10-06 ENCOUNTER — Telehealth: Payer: Self-pay

## 2012-10-06 DIAGNOSIS — C8290 Follicular lymphoma, unspecified, unspecified site: Secondary | ICD-10-CM

## 2012-10-06 NOTE — Telephone Encounter (Signed)
Spoke earlier w/pt she can come for an appt Fri at 1100. Requested labs drawn at Dr Andrew Au office and saw that Dr Ralene Ok wishes to get further studies. LVM for pt that she needs to come in at 1030 on Fri for labs before her 11 am appt with Dr Ralene Ok

## 2012-10-09 ENCOUNTER — Other Ambulatory Visit: Payer: Self-pay

## 2012-10-09 ENCOUNTER — Ambulatory Visit (HOSPITAL_BASED_OUTPATIENT_CLINIC_OR_DEPARTMENT_OTHER): Payer: Medicare Other | Admitting: Lab

## 2012-10-09 DIAGNOSIS — C8290 Follicular lymphoma, unspecified, unspecified site: Secondary | ICD-10-CM

## 2012-10-09 DIAGNOSIS — C8299 Follicular lymphoma, unspecified, extranodal and solid organ sites: Secondary | ICD-10-CM

## 2012-10-09 LAB — COMPREHENSIVE METABOLIC PANEL (CC13)
ALT: 15 U/L (ref 0–55)
AST: 25 U/L (ref 5–34)
Albumin: 3.4 g/dL — ABNORMAL LOW (ref 3.5–5.0)
BUN: 25.5 mg/dL (ref 7.0–26.0)
Calcium: 9.6 mg/dL (ref 8.4–10.4)
Chloride: 103 mEq/L (ref 98–107)
Potassium: 4 mEq/L (ref 3.5–5.1)
Sodium: 141 mEq/L (ref 136–145)
Total Protein: 7.4 g/dL (ref 6.4–8.3)

## 2012-10-09 LAB — CBC WITH DIFFERENTIAL/PLATELET
Basophils Absolute: 0 10*3/uL (ref 0.0–0.1)
EOS%: 2.2 % (ref 0.0–7.0)
HGB: 11.6 g/dL (ref 11.6–15.9)
MCH: 32.7 pg (ref 25.1–34.0)
NEUT#: 2 10*3/uL (ref 1.5–6.5)
RBC: 3.54 10*6/uL — ABNORMAL LOW (ref 3.70–5.45)
RDW: 13.6 % (ref 11.2–14.5)
lymph#: 0.8 10*3/uL — ABNORMAL LOW (ref 0.9–3.3)

## 2012-10-12 ENCOUNTER — Telehealth: Payer: Self-pay | Admitting: Oncology

## 2012-10-12 NOTE — Telephone Encounter (Signed)
lvm for pt regarding to april appt.Marland KitchenMarland Kitchen

## 2012-10-21 ENCOUNTER — Telehealth: Payer: Self-pay | Admitting: Medical Oncology

## 2012-10-26 NOTE — Telephone Encounter (Signed)
Opened by mistake.

## 2012-10-27 ENCOUNTER — Telehealth: Payer: Self-pay | Admitting: Oncology

## 2012-10-27 ENCOUNTER — Ambulatory Visit (HOSPITAL_BASED_OUTPATIENT_CLINIC_OR_DEPARTMENT_OTHER): Payer: Medicare Other | Admitting: Physician Assistant

## 2012-10-27 VITALS — BP 159/81 | HR 55 | Temp 98.0°F | Resp 18 | Ht 64.0 in | Wt 109.3 lb

## 2012-10-27 DIAGNOSIS — C8290 Follicular lymphoma, unspecified, unspecified site: Secondary | ICD-10-CM

## 2012-10-27 DIAGNOSIS — C8299 Follicular lymphoma, unspecified, extranodal and solid organ sites: Secondary | ICD-10-CM

## 2012-10-27 DIAGNOSIS — R591 Generalized enlarged lymph nodes: Secondary | ICD-10-CM

## 2012-10-27 DIAGNOSIS — R599 Enlarged lymph nodes, unspecified: Secondary | ICD-10-CM

## 2012-10-27 NOTE — Patient Instructions (Signed)
Follow up with Dr. Ralene Ok in 4 months with labs. Please call us if your lymph nodes look or feel  bigger

## 2012-10-27 NOTE — Telephone Encounter (Signed)
gv and printed appt schedule for pt for Aug

## 2012-10-27 NOTE — Progress Notes (Signed)
[place]  [phone]    OFFICE PROGRESS NOTE   c.c:Robert R. Ehinger, M.D.  1. History of Follicular Lymphoma: She was first seen at our office on 12/11/11.In review, per notes, she  had a  node that was removed from the left neck and axilla 15 and 20 years ago. She was told that the areas were benign. These areas were once again present around March 2013.  Thus, a CT scan of the abdomen and pelvis on 10/02/2011 that noted shotty bilateral inguinal nodes and a hiatal hernia, otherwise negative. The liver, spleen and pancreas were unremarkable. There was no mesenteric or retroperitoneal adenopathy. She was referred to Dr. Neldon Mc who, on 11/18/2011, performed an excisional biopsy of right inguinal node. Surgical pathology revealed atypical follicular proliferation which was positive for BCl-2 and CD20. The overall impression was that this was clearly involvement of follicular lymphoma. (Case number [id number]). She was initiated on Rituxan treatments, 375 mg/m2=600 mg q wk x 4,  From  01/30/2012-02/20/12. PET scan on 12/23/11 had shown axillary, subpectoral and inguinal lymphadenopathy. A PET scan on 02/19/12  did show a complete metabolic response to therapy with only 1 focus of very mild hypermetabolic activity in the right axilla.She is now on close surveillance. She has remained clinically stable.  2. Hypertension. 3.osteoarthritis 4. Osteoporosis 5. Irritable bowel syndrome 6. COPD 7. GERD 8. mitral valve prolapse 9. Hiatal hernia  MEDICATIONS:  Current Outpatient Prescriptions  Medication Sig Dispense Refill  . acetaminophen (TYLENOL) 650 MG CR tablet Take 650 mg by mouth every 8 (eight) hours as needed.      Marland Kitchen atenolol (TENORMIN) 50 MG tablet Take 50 mg by mouth daily.      . Calcium Carbonate-Vitamin D (CALCIUM-D) 600-400 MG-UNIT TABS Take by mouth daily.       . chlordiazePOXIDE (LIBRIUM) 10 MG capsule Take 10 mg by mouth 2 (two) times daily as  needed.      Marland Kitchen dextromethorphan-guaiFENesin (MUCINEX DM) 30-600 MG per 12 hr tablet Take 1 tablet by mouth as needed.      . fluticasone (FLONASE) 50 MCG/ACT nasal spray Place 2 sprays into the nose daily as needed.       . hydrochlorothiazide (HYDRODIURIL) 25 MG tablet Take 25 mg by mouth daily.      Marland Kitchen HYDROcodone-acetaminophen (NORCO) 5-325 MG per tablet Take 1 tablet by mouth every 6 (six) hours as needed.      . hyoscyamine (LEVSIN SL) 0.125 MG SL tablet Place 0.125 mg under the tongue every 4 (four) hours as needed.      . meclizine (ANTIVERT) 25 MG tablet Take 25 mg by mouth 3 (three) times daily as needed.      . Multiple Vitamin (MULTIVITAMIN) capsule Take 1 capsule by mouth daily.      . nabumetone (RELAFEN) 750 MG tablet Take 750 mg by mouth 2 (two) times daily.      Marland Kitchen Propylene Glycol (SYSTANE BALANCE) 0.6 % SOLN Apply to eye.      . ranitidine (ZANTAC) 300 MG tablet Take 300 mg by mouth at bedtime as needed.       . sertraline (ZOLOFT) 50 MG tablet Take 50 mg by mouth daily.       No current facility-administered medications for this visit.    ALLERGIES:   Allergies  Allergen Reactions  . Amoxicillin Diarrhea and Nausea And Vomiting  . Aspirin Nausea And Vomiting and Other (See Comments)    Heartburn,  Indigestion.  . Benadryl (Diphenhydramine Hcl) Nausea And Vomiting  . Ibuprofen Nausea And Vomiting and Palpitations  . Sulfa Antibiotics Itching and Nausea And Vomiting  . Sulindac     Ineffective for arthritis  . Penicillins Rash     PHYSICAL EXAMINATION:   Filed Vitals:   10/27/12 0916  BP: 159/81  Pulse: 55  Temp: 98 F (36.7 C)  Resp: 18   Filed Weights   10/27/12 0916  Weight: 109 lb 4.8 oz (49.57 kg)    79 year old  in no acute distress A. and O. X3. Patient looks younger than stated age. General well-developed and thin  HEENT: Normocephalic, atraumatic, PERRLA. Oral cavity without thrush or lesions. Neck supple. no thyromegaly, no cervical or  supraclavicular adenopathy  Lungs clear bilaterally . No wheezing, rhonchi or rales. Cardiac: regular rate and rhythm,no murmur , rubs or gallops Abdomen soft nontender , bowel sounds x4. No HSM Extremities no clubbing cyanosis or edema. No bruising or petechial rash Neuro: non focal Lymph nodes:no cervical or supraclavicular lymphadenopathy cannot appreciate the presence of known axillary lymphadenopathy. However, at her inguinal region, palpable shotty lymph nodes are noted, less than 1cm.  LABORATORY/RADIOLOGY DATA:  As 10/09/2012 her white count is 3.3 ANC 2.2 hemoglobin 11.6 hematocrit 34.2 MCV 96.6 platelets 140. LDH 154 BUN 25.5 and creatinine 1.2. 03/26/2012: White cell count 3.3, hemoglobin 11.8, hematocrit 35.1, platelets 134. BUN 26, creatinine 1.1 LDH 150.   Radiology Studies:  1.PET scan 02/19/2012: complete metabolic response to therapy. There is one focus of very mild hypermetabolic activity in the right axilla, however, this appears  to correspond to the right subclavian vessels better than it does the minimally enlarged right axillary lymph nodes. 2. Other than some borderline enlarged and mildly enlarged bilateral axillary lymph nodes (right greater than left), no other definite lymphadenopathy is identified on today's examination.   2.CT of the chest abdomen and pelvis with contrast on 03/31/12 Borderline enlarged and mildly enlarged right axillary lymph nodes appear unchanged and are nonspecific.   No other evidence to suggest lymphoma recurrence in the thoraxThe appearance of the lungs is unchanged, as discussed above. 4. Atherosclerosis.  3. Mammogram on 03/10/2012, negative for malignancy  4. PET scan on 12/11/2011 showing Mildly enlarged and mildly hypermetabolic axillary and sub pectoralis lymph nodes consistent with low grade lymphoma.   Mildly hypermetabolic superficial inguinal lymph nodes also consistent with low grade lymphoma. No retroperitoneal or mediastinal  hypermetabolic nodes. Spleen appears normal. No focal abnormal activity within the marrow.  5.CT of the chest with contrast 12/11/2011 showing right suprapectoral and less so axillary nodes highly suspicious for active lymphoma. Left axillary and subpectoral nodes are smaller and indeterminate. No other areas of thoracic adenopathy  6.CT of the abdomen and pelvis with contrast 09/30/2011 showing no acute findings or other significant abnormality. Showed the bilateral inguinal lymph nodes, not pathologically enlarged, no evidence of inguinal soft tissue mass  7. Bone density scan 04/11/2011, showing lumbar BMD 0.775, young adult T score -2.8 and the Z score 0.1,left femoral neck Bone Mineral Density (BMD): 0.647 g/cm2  Young Adult T Score: -1.8 Z Score: 0.6 statistically significant 5.8% increase in BMD in the spine and no significant change in BMD in the total left hip as compared to baseline study dated 03/25/2007. Change in the spine may reflect increasing degenerative change. There has been no significant change in BMD in the spine or total left hip as compared to 04/10/2009.   8. Excisional biopsy  of  a right inguinal lymph node 11/18/2011, with pathology report as follows:   Tissue-Flow Cytometry - FLOW CYTOMETRIC ANALYSIS OF THE LYMPHOCYTES PRESENT IN THE POPULATION FAILS TOHIGHLIGHT THE PRESENCE OF A MONOCLONAL B CELL POPULATION OR ABNORMAL T CELLPOPULATION. PLEASE ALSO SEE THE PATIENT'S CONCORDANT SURGICAL PATHOLOGYSPECIMEN, [id number]. (JBK:GT, 11/22/11) Enid Cutter MD Pathologist, Electronic Signature (Case signed 11/22/2011)  GROSS AND MICROSCOPIC INFORMATION Source Tissue-Flow Cytometry Microscopic Gated population: Flow cytometric immunophenotyping is performed using antibiodies to the antigens listed in the table below. Electronic gates are placed around a cell cluster displaying light scatter propertiescorresponding to lymphocytes.- Abnormal Cells in gated population: NA-  Phenotype of Abnormal Cells: NA 1 of 2 FINAL for Cobern, Tiyona P OU:[id number])  FINAL DIAGNOSIS Diagnosis Lymph node for lymphoma, Right groin - ATYPICAL FOLLICULAR PROLIFERATION. - SEE COMMENT. Microscopic Comment The specimen reveals a lymph node with preserved architecture and background follicular hyperplasia. There are some follicles with atypical features, including lack of polarity and relatively monotonous architecture.These follicles are positive for BCL2. These findings are highly suggestive of partial/early involvement byfollicular lymphoma. CD3 highlights the presence of T-cells and CD-20 highlights the presence of B-cells.(JK:mw 11-22-11)JOSHUA KISH MD Pathologist, Electronic Signature (Case signed 11/22/2011)  ASSESSMENT AND PLAN:   Ms. Heynen is an 78 year old woman with [id number] follicular lymphoma. Completed 4 weekly treatments of Rituxan from 01/30/2012 through 02/20/2012. A followup A PET scan favors a complete metabolic response to therapy with mild adenopathy in both axillae, otherwise rest of exam was remarkable.Dr. Dagoberto Ligas son has seen and evaluated the patient, and reviewed the extensive chart. He recommended continued surveillance.She will  return in 4 months' time with CBC, CMET, LDH. She was reminded to receive her fall flu shot as she has been doing. She is also to receive her shingles shot.Time spent greater than 40 minutes  .    Kelly, PA-C 10/27/2012, 12:44 PM

## 2013-02-03 ENCOUNTER — Telehealth: Payer: Self-pay | Admitting: Hematology and Oncology

## 2013-02-03 NOTE — Telephone Encounter (Signed)
Changed time of 8/8 lb/CP1 to 10am due to meeting. lmonvm for pt and mailed schedule.

## 2013-02-26 ENCOUNTER — Telehealth: Payer: Self-pay | Admitting: Hematology and Oncology

## 2013-02-26 ENCOUNTER — Ambulatory Visit (HOSPITAL_BASED_OUTPATIENT_CLINIC_OR_DEPARTMENT_OTHER): Payer: Medicare Other | Admitting: Hematology and Oncology

## 2013-02-26 ENCOUNTER — Other Ambulatory Visit (HOSPITAL_BASED_OUTPATIENT_CLINIC_OR_DEPARTMENT_OTHER): Payer: Medicare Other | Admitting: Lab

## 2013-02-26 VITALS — BP 197/88 | HR 57 | Temp 98.2°F | Resp 20 | Ht 64.0 in | Wt 109.7 lb

## 2013-02-26 DIAGNOSIS — R591 Generalized enlarged lymph nodes: Secondary | ICD-10-CM

## 2013-02-26 DIAGNOSIS — C8290 Follicular lymphoma, unspecified, unspecified site: Secondary | ICD-10-CM

## 2013-02-26 DIAGNOSIS — R599 Enlarged lymph nodes, unspecified: Secondary | ICD-10-CM

## 2013-02-26 DIAGNOSIS — C8299 Follicular lymphoma, unspecified, extranodal and solid organ sites: Secondary | ICD-10-CM

## 2013-02-26 LAB — CBC WITH DIFFERENTIAL/PLATELET
BASO%: 0.5 % (ref 0.0–2.0)
EOS%: 2.7 % (ref 0.0–7.0)
HCT: 35.7 % (ref 34.8–46.6)
HGB: 11.8 g/dL (ref 11.6–15.9)
MCH: 32.2 pg (ref 25.1–34.0)
MCHC: 33.1 g/dL (ref 31.5–36.0)
MONO#: 0.4 10*3/uL (ref 0.1–0.9)
NEUT%: 55.4 % (ref 38.4–76.8)
RDW: 13.1 % (ref 11.2–14.5)
WBC: 3.7 10*3/uL — ABNORMAL LOW (ref 3.9–10.3)
lymph#: 1.1 10*3/uL (ref 0.9–3.3)

## 2013-02-26 LAB — COMPREHENSIVE METABOLIC PANEL (CC13)
ALT: 13 U/L (ref 0–55)
AST: 24 U/L (ref 5–34)
Albumin: 3.5 g/dL (ref 3.5–5.0)
Alkaline Phosphatase: 63 U/L (ref 40–150)
Calcium: 9.6 mg/dL (ref 8.4–10.4)
Chloride: 105 mEq/L (ref 98–109)
Potassium: 4.1 mEq/L (ref 3.5–5.1)
Sodium: 141 mEq/L (ref 136–145)
Total Protein: 7.8 g/dL (ref 6.4–8.3)

## 2013-02-26 NOTE — Telephone Encounter (Signed)
gv adn printed appt sched and avs for pt °

## 2013-02-26 NOTE — Progress Notes (Signed)
[place]  [phone]    OFFICE PROGRESS NOTE   c.c:Robert R. Ehinger, M.D.  1. History of Follicular Lymphoma: She was first seen at our office on 12/11/11.In review, per notes, she  had a  node that was removed from the left neck and axilla 15 and 20 years ago. She was told that the areas were benign. These areas were once again present around March 2013.  Thus, a CT scan of the abdomen and pelvis on 10/02/2011 that noted shotty bilateral inguinal nodes and a hiatal hernia, otherwise negative. The liver, spleen and pancreas were unremarkable. There was no mesenteric or retroperitoneal adenopathy. She was referred to Dr. Neldon Mc who, on 11/18/2011, performed an excisional biopsy of right inguinal node. Surgical pathology revealed atypical follicular proliferation which was positive for BCl-2 and CD20. The overall impression was that this was clearly involvement of follicular lymphoma. (Case number SZ [id number]). She was initiated on Rituxan treatments, 375 mg/m2=600 mg q wk x 4,  From  01/30/2012-02/20/12. PET scan on 12/23/11 had shown axillary, subpectoral and inguinal lymphadenopathy. A PET scan on 02/19/12  did show a complete metabolic response to therapy with only 1 focus of very mild hypermetabolic activity in the right axilla.She is now on close surveillance. She has remained clinically stable.  2. Hypertension. 3.osteoarthritis 4. Osteoporosis 5. Irritable bowel syndrome 6. COPD 7. GERD 8. mitral valve prolapse 9. Hiatal hernia  MEDICATIONS:  Current Outpatient Prescriptions  Medication Sig Dispense Refill  . acetaminophen (TYLENOL) 650 MG CR tablet Take 650 mg by mouth every 8 (eight) hours as needed.      Marland Kitchen atenolol (TENORMIN) 50 MG tablet Take 50 mg by mouth daily.      . Calcium Carbonate-Vitamin D (CALCIUM-D) 600-400 MG-UNIT TABS Take by mouth daily.       . chlordiazePOXIDE (LIBRIUM) 10 MG capsule Take 10 mg by mouth 2 (two) times daily as  needed.      Marland Kitchen dextromethorphan-guaiFENesin (MUCINEX DM) 30-600 MG per 12 hr tablet Take 1 tablet by mouth as needed.      . fluticasone (FLONASE) 50 MCG/ACT nasal spray Place 2 sprays into the nose daily as needed.       . hydrochlorothiazide (HYDRODIURIL) 25 MG tablet Take 25 mg by mouth daily.      Marland Kitchen HYDROcodone-acetaminophen (NORCO) 5-325 MG per tablet Take 1 tablet by mouth every 6 (six) hours as needed.      . hyoscyamine (LEVSIN SL) 0.125 MG SL tablet Place 0.125 mg under the tongue every 4 (four) hours as needed.      . meclizine (ANTIVERT) 25 MG tablet Take 25 mg by mouth 3 (three) times daily as needed.      . Multiple Vitamin (MULTIVITAMIN) capsule Take 1 capsule by mouth daily.      . nabumetone (RELAFEN) 750 MG tablet Take 750 mg by mouth 2 (two) times daily.      Marland Kitchen Propylene Glycol (SYSTANE BALANCE) 0.6 % SOLN Apply to eye.      . ranitidine (ZANTAC) 300 MG tablet Take 300 mg by mouth at bedtime as needed.       . sertraline (ZOLOFT) 50 MG tablet Take 50 mg by mouth daily.       No current facility-administered medications for this visit.    ALLERGIES:   Allergies  Allergen Reactions  . Amoxicillin Diarrhea and Nausea And Vomiting  . Aspirin Nausea And Vomiting and Other (See Comments)    Heartburn,  Indigestion.  . Benadryl (Diphenhydramine Hcl) Nausea And Vomiting  . Ibuprofen Nausea And Vomiting and Palpitations  . Sulfa Antibiotics Itching and Nausea And Vomiting  . Sulindac     Ineffective for arthritis  . Penicillins Rash     PHYSICAL EXAMINATION:   Filed Vitals:   02/26/13 1006  BP: 197/88  Pulse: 57  Temp: 98.2 F (36.8 C)  Resp: 20   Filed Weights   02/26/13 1006  Weight: 109 lb 11.2 oz (49.17 kg)    79 year old  in no acute distress A. and O. X3. Patient looks younger than stated age. General well-developed and thin  HEENT: Normocephalic, atraumatic, PERRLA. Oral cavity without thrush or lesions. Neck supple. no thyromegaly, no cervical or  supraclavicular adenopathy  Lungs clear bilaterally . No wheezing, rhonchi or rales. Cardiac: regular rate and rhythm,no murmur , rubs or gallops Abdomen soft nontender , bowel sounds x4. No HSM Extremities no clubbing cyanosis or edema. No bruising or petechial rash Neuro: non focal Lymph nodes:no cervical or supraclavicular lymphadenopathy cannot appreciate the presence of known axillary lymphadenopathy. However, at her inguinal region, palpable shotty lymph nodes are noted, less than 1cm.  LABORATORY/RADIOLOGY DATA:  As 10/09/2012 her white count is 3.3 ANC 2.2 hemoglobin 11.6 hematocrit 34.2 MCV 96.6 platelets 140. LDH 154 BUN 25.5 and creatinine 1.2. 03/26/2012: White cell count 3.3, hemoglobin 11.8, hematocrit 35.1, platelets 134. BUN 26, creatinine 1.1 LDH 150.   Radiology Studies:  1.PET scan 02/19/2012: complete metabolic response to therapy. There is one focus of very mild hypermetabolic activity in the right axilla, however, this appears  to correspond to the right subclavian vessels better than it does the minimally enlarged right axillary lymph nodes. 2. Other than some borderline enlarged and mildly enlarged bilateral axillary lymph nodes (right greater than left), no other definite lymphadenopathy is identified on today's examination.   2.CT of the chest abdomen and pelvis with contrast on 03/31/12 Borderline enlarged and mildly enlarged right axillary lymph nodes appear unchanged and are nonspecific.   No other evidence to suggest lymphoma recurrence in the thoraxThe appearance of the lungs is unchanged, as discussed above. 4. Atherosclerosis.  3. Mammogram on 03/10/2012, negative for malignancy  4. PET scan on 12/11/2011 showing Mildly enlarged and mildly hypermetabolic axillary and sub pectoralis lymph nodes consistent with low grade lymphoma.   Mildly hypermetabolic superficial inguinal lymph nodes also consistent with low grade lymphoma. No retroperitoneal or mediastinal  hypermetabolic nodes. Spleen appears normal. No focal abnormal activity within the marrow.  5.CT of the chest with contrast 12/11/2011 showing right suprapectoral and less so axillary nodes highly suspicious for active lymphoma. Left axillary and subpectoral nodes are smaller and indeterminate. No other areas of thoracic adenopathy  6.CT of the abdomen and pelvis with contrast 09/30/2011 showing no acute findings or other significant abnormality. Showed the bilateral inguinal lymph nodes, not pathologically enlarged, no evidence of inguinal soft tissue mass  7. Bone density scan 04/11/2011, showing lumbar BMD 0.775, young adult T score -2.8 and the Z score 0.1,left femoral neck Bone Mineral Density (BMD): 0.647 g/cm2  Young Adult T Score: -1.8 Z Score: 0.6 statistically significant 5.8% increase in BMD in the spine and no significant change in BMD in the total left hip as compared to baseline study dated 03/25/2007. Change in the spine may reflect increasing degenerative change. There has been no significant change in BMD in the spine or total left hip as compared to 04/10/2009.   8. Excisional biopsy  of  a right inguinal lymph node 11/18/2011, with pathology report as follows:   Tissue-Flow Cytometry - FLOW CYTOMETRIC ANALYSIS OF THE LYMPHOCYTES PRESENT IN THE POPULATION FAILS TOHIGHLIGHT THE PRESENCE OF A MONOCLONAL B CELL POPULATION OR ABNORMAL T CELLPOPULATION. PLEASE ALSO SEE THE PATIENT'S CONCORDANT SURGICAL PATHOLOGYSPECIMEN, [id number]. (JBK:GT, 11/22/11) Enid Cutter MD Pathologist, Electronic Signature (Case signed 11/22/2011)  GROSS AND MICROSCOPIC INFORMATION Source Tissue-Flow Cytometry Microscopic Gated population: Flow cytometric immunophenotyping is performed using antibiodies to the antigens listed in the table below. Electronic gates are placed around a cell cluster displaying light scatter propertiescorresponding to lymphocytes.- Abnormal Cells in gated population: NA-  Phenotype of Abnormal Cells: NA 1 of 2 FINAL for Jeangilles, [place] P [id number])  FINAL DIAGNOSIS Diagnosis Lymph node for lymphoma, Right groin - ATYPICAL FOLLICULAR PROLIFERATION. - SEE COMMENT. Microscopic Comment The specimen reveals a lymph node with preserved architecture and background follicular hyperplasia. There are some follicles with atypical features, including lack of polarity and relatively monotonous architecture.These follicles are positive for BCL2. These findings are highly suggestive of partial/early involvement byfollicular lymphoma. CD3 highlights the presence of T-cells and CD-20 highlights the presence of B-cells.(JK:mw 11-22-11)JOSHUA KISH MD Pathologist, Electronic Signature (Case signed 11/22/2011)  ASSESSMENT AND PLAN:  Ms. Muzio is an 79 year old woman with [id number] follicular lymphoma. Completed 4 weekly treatments of Rituxan from 01/30/2012 through 02/20/2012. A followup A PET scan favors a complete metabolic response to therapy with mild adenopathy in both axillae, otherwise rest of exam was remarkable. She is feeling well, has no new complaints, no fever or chills no night sweats, she remains active with good energy level. Her appetite stable and her weight is stable as well.  We would recommend continue surveillance. She will  return in 3 months' time with CBC, CMET, LDH. She knows to call us in the interim if any problem arise.  Amalio Loe, [place],  02/26/2013, 10:21 AM

## 2013-03-09 ENCOUNTER — Other Ambulatory Visit: Payer: Self-pay | Admitting: Dermatology

## 2013-03-24 ENCOUNTER — Other Ambulatory Visit: Payer: Self-pay

## 2013-03-24 DIAGNOSIS — Z1231 Encounter for screening mammogram for malignant neoplasm of breast: Secondary | ICD-10-CM

## 2013-04-21 ENCOUNTER — Ambulatory Visit
Admission: RE | Admit: 2013-04-21 | Discharge: 2013-04-21 | Disposition: A | Discharge status: Home / Self Care | Payer: Medicare Other | Source: Ambulatory Visit

## 2013-04-21 DIAGNOSIS — Z1231 Encounter for screening mammogram for malignant neoplasm of breast: Secondary | ICD-10-CM

## 2013-04-23 ENCOUNTER — Other Ambulatory Visit: Payer: Self-pay | Admitting: Family Medicine

## 2013-04-23 DIAGNOSIS — R928 Other abnormal and inconclusive findings on diagnostic imaging of breast: Secondary | ICD-10-CM

## 2013-05-06 ENCOUNTER — Ambulatory Visit
Admission: RE | Admit: 2013-05-06 | Discharge: 2013-05-06 | Disposition: A | Discharge status: Home / Self Care | Payer: Medicare Other | Source: Ambulatory Visit | Attending: Family Medicine | Admitting: Family Medicine

## 2013-05-06 DIAGNOSIS — R928 Other abnormal and inconclusive findings on diagnostic imaging of breast: Secondary | ICD-10-CM

## 2013-05-26 ENCOUNTER — Ambulatory Visit (HOSPITAL_BASED_OUTPATIENT_CLINIC_OR_DEPARTMENT_OTHER): Payer: Medicare Other | Admitting: Internal Medicine

## 2013-05-26 ENCOUNTER — Encounter (INDEPENDENT_AMBULATORY_CARE_PROVIDER_SITE_OTHER): Payer: Self-pay

## 2013-05-26 ENCOUNTER — Other Ambulatory Visit: Payer: Self-pay | Admitting: Internal Medicine

## 2013-05-26 ENCOUNTER — Telehealth: Payer: Self-pay | Admitting: Internal Medicine

## 2013-05-26 ENCOUNTER — Other Ambulatory Visit (HOSPITAL_BASED_OUTPATIENT_CLINIC_OR_DEPARTMENT_OTHER): Payer: Medicare Other | Admitting: Lab

## 2013-05-26 VITALS — BP 180/84 | HR 60 | Temp 97.5°F | Resp 20 | Ht 64.0 in | Wt 114.2 lb

## 2013-05-26 DIAGNOSIS — C8290 Follicular lymphoma, unspecified, unspecified site: Secondary | ICD-10-CM

## 2013-05-26 DIAGNOSIS — C8299 Follicular lymphoma, unspecified, extranodal and solid organ sites: Secondary | ICD-10-CM

## 2013-05-26 DIAGNOSIS — R591 Generalized enlarged lymph nodes: Secondary | ICD-10-CM

## 2013-05-26 LAB — CBC WITH DIFFERENTIAL/PLATELET
Basophils Absolute: 0 10*3/uL (ref 0.0–0.1)
EOS%: 2.3 % (ref 0.0–7.0)
HCT: 35.2 % (ref 34.8–46.6)
HGB: 11.6 g/dL (ref 11.6–15.9)
LYMPH%: 19.8 % (ref 14.0–49.7)
MCH: 32.6 pg (ref 25.1–34.0)
MCV: 98.5 fL (ref 79.5–101.0)
MONO%: 9.6 % (ref 0.0–14.0)
NEUT%: 67.3 % (ref 38.4–76.8)
RBC: 3.57 10*6/uL — ABNORMAL LOW (ref 3.70–5.45)
lymph#: 0.7 10*3/uL — ABNORMAL LOW (ref 0.9–3.3)

## 2013-05-26 LAB — COMPREHENSIVE METABOLIC PANEL (CC13)
AST: 23 U/L (ref 5–34)
Alkaline Phosphatase: 64 U/L (ref 40–150)
Anion Gap: 9 mEq/L (ref 3–11)
BUN: 25.3 mg/dL (ref 7.0–26.0)
CO2: 28 mEq/L (ref 22–29)
Creatinine: 1 mg/dL (ref 0.6–1.1)

## 2013-05-26 LAB — LACTATE DEHYDROGENASE (CC13): LDH: 167 U/L (ref 125–245)

## 2013-05-26 NOTE — Telephone Encounter (Signed)
per 11/4 POF appt sch avs and cal given shh

## 2013-05-27 NOTE — Progress Notes (Signed)
[facility] OFFICE PROGRESS NOTE  Simona Huh, MD [address], [address] [place] [id number]  DIAGNOSIS: Follicular lymphoma - Plan: CBC with Differential, Comprehensive metabolic panel, Lactate dehydrogenase  Lymphadenopathy  Chief Complaint  Patient presents with  . Follicular lymphoma    CURRENT THERAPY: Observation.   INTERVAL HISTORY: [name] 79 y.o. female with a history of follicular lymphoma who has been followed in our office since May 2013 who is here for follow-up.  She was last seen by Dr. Jilda Roche on 02/26/2013.  Her history is as noted below. She continues to be very active. She denies any recent hospitalizations or emergency room visits.  She also denies fevers or chills or acute shortness of breath.   MEDICAL HISTORY: Past Medical History  Diagnosis Date  . Hypertension   . COPD (chronic obstructive pulmonary disease)   . GERD (gastroesophageal reflux disease)   . MVP (mitral valve prolapse)   . Depression   . Allergy   . Osteoporosis   . DJD (degenerative joint disease)   . Hx of colonic polyp 10/02/05  . Heart murmur     had echo 20 yr ago-not sure where  . Cancer     follicular lymphoma    INTERIM HISTORY: has Lymphadenopathy and Follicular lymphoma on her problem list.    ALLERGIES:  is allergic to amoxicillin; aspirin; benadryl; ibuprofen; sulfa antibiotics; sulindac; and penicillins.  MEDICATIONS: has a current medication list which includes the following prescription(s): acetaminophen, atenolol, calcium-d, chlordiazepoxide, dextromethorphan-guaifenesin, fluticasone, hydrochlorothiazide, hydrocodone-acetaminophen, hyoscyamine, meclizine, multivitamin, nabumetone, propylene glycol, ranitidine, and sertraline.  SURGICAL HISTORY:  Past Surgical History  Procedure Laterality Date  . Cataract extraction      right eye  . Dilation and curettage of uterus      several  . Ovarian cyst removal    . Appendectomy    .  Colonoscopy     PROBLEM LIST: 1. History of Follicular Lymphoma: She was first seen at our office on 12/11/11.In review, per notes, she had a node that was removed from the left neck and axilla 15 and 20 years ago. She was told that the areas were benign. These areas were once again present around March 2013. Thus, a CT scan of the abdomen and pelvis on 10/02/2011 that noted shotty bilateral inguinal nodes and a hiatal hernia, otherwise negative. The liver, spleen and pancreas were unremarkable. There was no mesenteric or retroperitoneal adenopathy. She was referred to Dr. Neldon Mc who, on 11/18/2011, performed an excisional biopsy of right inguinal node. Surgical pathology revealed atypical follicular proliferation which was positive for BCl-2 and CD20. The overall impression was that this was clearly involvement of follicular lymphoma. (Case number SZ [id number]). She was initiated on Rituxan treatments, 375 mg/m2=600 mg q wk x 4, From 01/30/2012-02/20/12. PET scan on 12/23/11 had shown axillary, subpectoral and inguinal lymphadenopathy. A PET scan on 02/19/12 did show a complete metabolic response to therapy with only 1 focus of very mild hypermetabolic activity in the right axilla.She is now on close surveillance. She has remained clinically stable.  2. Hypertension.  3.osteoarthritis  4. Osteoporosis  5. Irritable bowel syndrome  6. COPD  7. GERD  8. mitral valve prolapse  9. Hiatal hernia  REVIEW OF SYSTEMS:   Constitutional: Denies fevers, chills or abnormal weight loss Eyes: Denies blurriness of vision Ears, nose, mouth, throat, and face: Denies mucositis or sore throat Respiratory: Denies cough, dyspnea or wheezes Cardiovascular: Denies palpitation, chest discomfort or lower  extremity swelling Gastrointestinal:  Denies nausea, heartburn or change in bowel habits Skin: Denies abnormal skin rashes Lymphatics: Denies new lymphadenopathy or easy bruising Neurological:Denies numbness,  tingling or new weaknesses Behavioral/Psych: Mood is stable, no new changes  All other systems were reviewed with the patient and are negative.  PHYSICAL EXAMINATION: ECOG PERFORMANCE STATUS: 0 - Asymptomatic  Blood pressure 180/84, pulse 60, temperature 97.5 F (36.4 C), temperature source Oral, resp. rate 20, height 5\' 4"  (1.626 m), weight 114 lb 3.2 oz (51.801 kg), SpO2 95.00%.  GENERAL:alert, no distress and comfortable;. Patient looks younger than stated age. well-developed and thin  SKIN: skin color, texture, turgor are normal, no rashes or significant lesions EYES: normal, Conjunctiva are pink and non-injected, sclera clear OROPHARYNX:no exudate, no erythema and lips, buccal mucosa, and tongue normal  NECK: supple, thyroid normal size, non-tender, without nodularity LYMPH:  no palpable lymphadenopathy in the cervical, axillary or supraclavicular; inguinal region, palpable shotty lymph nodes are noted, less than 1cm.  LUNGS: clear to auscultation and percussion with normal breathing effort HEART: regular rate & rhythm and no murmurs and no lower extremity edema ABDOMEN:abdomen soft, non-tender and normal bowel sounds Musculoskeletal:no cyanosis of digits and no clubbing  NEURO: alert & oriented x 3 with fluent speech, no focal motor/sensory deficits  LABORATORY DATA: Results for orders placed in visit on 05/26/13 (from the past 48 hour(s))  CBC WITH DIFFERENTIAL     Status: Abnormal   Collection Time    05/26/13  9:36 AM      Result Value Range   WBC 3.8 (*) 3.9 - 10.3 10e3/uL   NEUT# 2.5  1.5 - 6.5 10e3/uL   HGB 11.6  11.6 - 15.9 g/dL   HCT 35.2  34.8 - 46.6 %   Platelets 150  145 - 400 10e3/uL   MCV 98.5  79.5 - 101.0 fL   MCH 32.6  25.1 - 34.0 pg   MCHC 33.1  31.5 - 36.0 g/dL   RBC 3.57 (*) 3.70 - 5.45 10e6/uL   RDW 12.8  11.2 - 14.5 %   lymph# 0.7 (*) 0.9 - 3.3 10e3/uL   MONO# 0.4  0.1 - 0.9 10e3/uL   Eosinophils Absolute 0.1  0.0 - 0.5 10e3/uL   Basophils Absolute  0.0  0.0 - 0.1 10e3/uL   NEUT% 67.3  38.4 - 76.8 %   LYMPH% 19.8  14.0 - 49.7 %   MONO% 9.6  0.0 - 14.0 %   EOS% 2.3  0.0 - 7.0 %   BASO% 1.0  0.0 - 2.0 %  COMPREHENSIVE METABOLIC PANEL (0000000)     Status: None   Collection Time    05/26/13  9:36 AM      Result Value Range   Sodium 141  136 - 145 mEq/L   Potassium 4.3  3.5 - 5.1 mEq/L   Chloride 104  98 - 109 mEq/L   CO2 28  22 - 29 mEq/L   Glucose 89  70 - 140 mg/dl   BUN 25.3  7.0 - 26.0 mg/dL   Creatinine 1.0  0.6 - 1.1 mg/dL   Total Bilirubin 0.55  0.20 - 1.20 mg/dL   Alkaline Phosphatase 64  40 - 150 U/L   AST 23  5 - 34 U/L   ALT 11  0 - 55 U/L   Total Protein 7.6  6.4 - 8.3 g/dL   Albumin 3.5  3.5 - 5.0 g/dL   Calcium 10.3  8.4 - 10.4 mg/dL  Anion Gap 9  3 - 11 mEq/L  LACTATE DEHYDROGENASE (CC13)     Status: None   Collection Time    05/26/13  9:36 AM      Result Value Range   LDH 167  125 - 245 U/L    Labs:  Lab Results  Component Value Date   WBC 3.8* 05/26/2013   HGB 11.6 05/26/2013   HCT 35.2 05/26/2013   MCV 98.5 05/26/2013   PLT 150 05/26/2013   NEUTROABS 2.5 05/26/2013      Chemistry      Component Value Date/Time   NA 141 05/26/2013 0936   NA 136 08/14/2012 0845   K 4.3 05/26/2013 0936   K 4.0 08/14/2012 0845   CL 103 10/09/2012 1036   CL 98 08/14/2012 0845   CO2 28 05/26/2013 0936   CO2 30 08/14/2012 0845   BUN 25.3 05/26/2013 0936   BUN 22 08/14/2012 0845   CREATININE 1.0 05/26/2013 0936   CREATININE 1.11* 08/14/2012 0845      Component Value Date/Time   CALCIUM 10.3 05/26/2013 0936   CALCIUM 10.0 08/14/2012 0845   ALKPHOS 64 05/26/2013 0936   ALKPHOS 64 08/14/2012 0845   AST 23 05/26/2013 0936   AST 25 08/14/2012 0845   ALT 11 05/26/2013 0936   ALT 13 08/14/2012 0845   BILITOT 0.55 05/26/2013 0936   BILITOT 0.4 08/14/2012 0845     Basic Metabolic Panel:  Recent Labs Lab 05/26/13 0936  NA 141  K 4.3  CO2 28  GLUCOSE 89  BUN 25.3  CREATININE 1.0  CALCIUM 10.3   GFR Estimated Creatinine  Clearance: 34.2 ml/min (by C-G formula based on Cr of 1). Liver Function Tests:  Recent Labs Lab 05/26/13 0936  AST 23  ALT 11  ALKPHOS 64  BILITOT 0.55  PROT 7.6  ALBUMIN 3.5   CBC:  Recent Labs Lab 05/26/13 0936  WBC 3.8*  NEUTROABS 2.5  HGB 11.6  HCT 35.2  MCV 98.5  PLT 150    Studies:  1.PET scan 02/19/2012: complete metabolic response to therapy. There is one focus of very mild hypermetabolic activity in the right axilla, however, this appears  to correspond to the right subclavian vessels better than it does the minimally enlarged right axillary lymph nodes. 2. Other than some borderline enlarged and mildly enlarged bilateral axillary lymph nodes (right greater than left), no other definite lymphadenopathy is identified on today's examination.  2.CT of the chest abdomen and pelvis with contrast on 03/31/12 Borderline enlarged and mildly enlarged right axillary lymph nodes appear unchanged and are nonspecific.  No other evidence to suggest lymphoma recurrence in the thoraxThe appearance of the lungs is unchanged, as discussed above. 4. Atherosclerosis.  3. Mammogram on 03/10/2012, negative for malignancy  4. PET scan on 12/11/2011 showing Mildly enlarged and mildly hypermetabolic axillary and sub pectoralis lymph nodes consistent with low grade lymphoma.  Mildly hypermetabolic superficial inguinal lymph nodes also consistent with low grade lymphoma. No retroperitoneal or mediastinal hypermetabolic nodes. Spleen appears normal. No focal abnormal activity within the marrow.  5.CT of the chest with contrast 12/11/2011 showing right suprapectoral and less so axillary nodes highly suspicious for active lymphoma. Left axillary and subpectoral nodes are smaller and indeterminate. No other areas of thoracic adenopathy  6.CT of the abdomen and pelvis with contrast 09/30/2011 showing no acute findings or other significant abnormality. Showed the bilateral inguinal lymph nodes, not  pathologically enlarged, no evidence of inguinal soft tissue mass  7. Bone density scan 04/11/2011, showing lumbar BMD 0.775, young adult T score -2.8 and the Z score 0.1,left femoral neck Bone Mineral Density (BMD): 0.647 g/cm2  Young Adult T Score: -1.8 Z Score: 0.6 statistically significant 5.8% increase in BMD in the spine and no significant change in BMD in the total left hip as compared to baseline study dated 03/25/2007. Change in the spine may reflect increasing degenerative change. There has been no significant change in BMD in the spine or total left hip as compared to 04/10/2009.  8. Excisional biopsy of a right inguinal lymph node 11/18/2011, with pathology report as follows:  Tissue-Flow Cytometry  - FLOW CYTOMETRIC ANALYSIS OF THE LYMPHOCYTES PRESENT IN THE POPULATION FAILS TOHIGHLIGHT THE PRESENCE OF A MONOCLONAL B CELL POPULATION OR ABNORMAL T CELLPOPULATION. PLEASE ALSO SEE THE PATIENT'S CONCORDANT SURGICAL PATHOLOGYSPECIMEN, [id number]. (JBK:GT, 11/22/11)  Enid Cutter MD  Pathologist, Electronic Signature  (Case signed 11/22/2011)  GROSS AND MICROSCOPIC INFORMATION  Source  Tissue-Flow Cytometry  Microscopic  Gated population: Flow cytometric immunophenotyping is performed using antibiodies to the antigens listed in the table below. Electronic gates are placed around a cell cluster displaying light scatter propertiescorresponding to lymphocytes.- Abnormal Cells in gated population: NA- Phenotype of Abnormal Cells: NA  1 of 2  FINAL for [name], [name] [id number])  FINAL DIAGNOSIS  Diagnosis  Lymph node for lymphoma, Right groin  - ATYPICAL FOLLICULAR PROLIFERATION.  - SEE COMMENT.  Microscopic Comment  The specimen reveals a lymph node with preserved architecture and background follicular hyperplasia. There are some follicles with atypical features, including lack of polarity and relatively monotonous architecture.These follicles are positive for BCL2. These findings are  highly suggestive of partial/early involvement byfollicular lymphoma. CD3 highlights the presence of T-cells and CD-20 highlights the presence of B-cells.(JK:mw 11-22-11)JOSHUA KISH MD  Pathologist, Electronic Signature  (Case signed 11/22/2011)    RADIOGRAPHIC STUDIES: [place] R  05/06/2013   CLINICAL DATA:  Patient was recalled from screening for possible right breast architectural distortion.  EXAM: DIGITAL DIAGNOSTIC  right MAMMOGRAM  COMPARISON:  Prior exams  ACR Breast Density Category c: The breasts are heterogeneously dense, which may obscure small masses.  FINDINGS: Questioned architectural distortion within the right breast resolved with additional imaging.  Mammographic images were processed with CAD.  IMPRESSION: Questioned architectural distortion within the right breast resolved with additional imaging. No mammographic evidence for malignancy.  RECOMMENDATION: Screening mammogram in one year.(Code:SM-B-01Y)  I have discussed the findings and recommendations with the patient. Results were also provided in writing at the conclusion of the visit. If applicable, a reminder letter will be sent to the patient regarding the next appointment.  BI-RADS CATEGORY  1: Negative   Electronically Signed   By: Lovey Newcomer M.D.   On: 05/06/2013 10:48    ASSESSMENT: [name] 79 y.o. female with a history of Follicular lymphoma - Plan: CBC with Differential, Comprehensive metabolic panel, Lactate dehydrogenase  Lymphadenopathy  PLAN:  1. Follicular Lymphoma. --Ms. Hebron is an 79 year old woman with [id number] follicular lymphoma. She continues to well. She completed 4 weekly treatments of Rituxan from 01/30/2012 through 02/20/2012. A followup A PET scan favors a complete metabolic response to therapy with mild adenopathy in both axillae, otherwise rest of exam was remarkable. She is feeling well, has no new complaints, no fever or chills no night sweats, she remains active with good  energy level. Her appetite stable and her weight is stable as well.   We would recommend  continue surveillance. She will return in 3 months' time with CBC, CMET, LDH.   All questions were answered. The patient knows to call the clinic with any problems, questions or concerns. We can certainly see the patient much sooner if necessary.  I spent 10 minutes counseling the patient face to face. The total time spent in the appointment was 15 minutes.    Lagena Strand, MD 05/27/2013 8:06 PM

## 2013-08-26 ENCOUNTER — Telehealth: Payer: Self-pay | Admitting: Internal Medicine

## 2013-08-26 ENCOUNTER — Ambulatory Visit (HOSPITAL_BASED_OUTPATIENT_CLINIC_OR_DEPARTMENT_OTHER): Payer: Medicare Other | Admitting: Internal Medicine

## 2013-08-26 ENCOUNTER — Other Ambulatory Visit (HOSPITAL_BASED_OUTPATIENT_CLINIC_OR_DEPARTMENT_OTHER): Payer: Medicare Other

## 2013-08-26 VITALS — BP 185/78 | HR 56 | Temp 96.8°F | Resp 18 | Ht 64.0 in | Wt 117.3 lb

## 2013-08-26 DIAGNOSIS — C8299 Follicular lymphoma, unspecified, extranodal and solid organ sites: Secondary | ICD-10-CM

## 2013-08-26 DIAGNOSIS — R599 Enlarged lymph nodes, unspecified: Secondary | ICD-10-CM

## 2013-08-26 DIAGNOSIS — C8290 Follicular lymphoma, unspecified, unspecified site: Secondary | ICD-10-CM

## 2013-08-26 DIAGNOSIS — R591 Generalized enlarged lymph nodes: Secondary | ICD-10-CM

## 2013-08-26 LAB — CBC WITH DIFFERENTIAL/PLATELET
BASO%: 1.9 % (ref 0.0–2.0)
BASOS ABS: 0.1 10*3/uL (ref 0.0–0.1)
EOS%: 10.9 % — ABNORMAL HIGH (ref 0.0–7.0)
Eosinophils Absolute: 0.5 10*3/uL (ref 0.0–0.5)
HCT: 35.1 % (ref 34.8–46.6)
HEMOGLOBIN: 11.8 g/dL (ref 11.6–15.9)
LYMPH%: 25.7 % (ref 14.0–49.7)
MCH: 33.1 pg (ref 25.1–34.0)
MCHC: 33.6 g/dL (ref 31.5–36.0)
MCV: 98.5 fL (ref 79.5–101.0)
MONO#: 0.5 10*3/uL (ref 0.1–0.9)
MONO%: 12.1 % (ref 0.0–14.0)
NEUT#: 2.1 10*3/uL (ref 1.5–6.5)
NEUT%: 49.4 % (ref 38.4–76.8)
Platelets: 149 10*3/uL (ref 145–400)
RBC: 3.56 10*6/uL — ABNORMAL LOW (ref 3.70–5.45)
RDW: 12.9 % (ref 11.2–14.5)
WBC: 4.2 10*3/uL (ref 3.9–10.3)
lymph#: 1.1 10*3/uL (ref 0.9–3.3)

## 2013-08-26 LAB — COMPREHENSIVE METABOLIC PANEL (CC13)
ALK PHOS: 61 U/L (ref 40–150)
ALT: 14 U/L (ref 0–55)
AST: 21 U/L (ref 5–34)
Albumin: 3.7 g/dL (ref 3.5–5.0)
Anion Gap: 8 mEq/L (ref 3–11)
BUN: 27.9 mg/dL — AB (ref 7.0–26.0)
CO2: 30 mEq/L — ABNORMAL HIGH (ref 22–29)
CREATININE: 1.1 mg/dL (ref 0.6–1.1)
Calcium: 9.9 mg/dL (ref 8.4–10.4)
Chloride: 104 mEq/L (ref 98–109)
Glucose: 74 mg/dl (ref 70–140)
Potassium: 4.2 mEq/L (ref 3.5–5.1)
Sodium: 141 mEq/L (ref 136–145)
Total Bilirubin: 0.54 mg/dL (ref 0.20–1.20)
Total Protein: 7.4 g/dL (ref 6.4–8.3)

## 2013-08-26 LAB — LACTATE DEHYDROGENASE (CC13): LDH: 158 U/L (ref 125–245)

## 2013-08-26 NOTE — Telephone Encounter (Signed)
gv pt appt schedule for May.

## 2013-08-27 NOTE — Progress Notes (Signed)
[place] OFFICE PROGRESS NOTE  Mary Huh, MD [address], [address] [place] [id number]  DIAGNOSIS: Follicular lymphoma - Plan: CBC with Differential, Lactate dehydrogenase (LDH) - [place]  Lymphadenopathy  Chief Complaint  Patient presents with  . Follicular lymphoma    CURRENT THERAPY: Surveillance.   INTERVAL HISTORY: [name] 80 y.o. female with a history of follicular lymphoma who has been followed in our office since May 2013 who is here for follow-up.  She was last seen by me on 05/26/2013.  Her history is as noted below. She continues to be very active. She denies any recent hospitalizations or emergency room visits.  She also denies fevers or chills or acute shortness of breath. She complains about construction going on near her house.  She reports compliance to medical therapy with her anti-hypertensives.    MEDICAL HISTORY: Past Medical History  Diagnosis Date  . Hypertension   . COPD (chronic obstructive pulmonary disease)   . GERD (gastroesophageal reflux disease)   . MVP (mitral valve prolapse)   . Depression   . Allergy   . Osteoporosis   . DJD (degenerative joint disease)   . Hx of colonic polyp 10/02/05  . Heart murmur     had echo 20 yr ago-not sure where  . Cancer     follicular lymphoma    INTERIM HISTORY: has Lymphadenopathy and Follicular lymphoma on her problem list.    ALLERGIES:  is allergic to amoxicillin; aspirin; benadryl; ibuprofen; sulfa antibiotics; sulindac; and penicillins.  MEDICATIONS: has a current medication list which includes the following prescription(s): acetaminophen, atenolol, calcium-d, chlordiazepoxide, dextromethorphan-guaifenesin, fluticasone, hydrochlorothiazide, hydrocodone-acetaminophen, hyoscyamine, meclizine, multivitamin, nabumetone, propylene glycol, ranitidine, and sertraline.  SURGICAL HISTORY:  Past Surgical History  Procedure Laterality Date  . Cataract extraction      right  eye  . Dilation and curettage of uterus      several  . Ovarian cyst removal    . Appendectomy    . Colonoscopy     PROBLEM LIST: 1. History of Follicular Lymphoma: She was first seen at our office on 12/11/11.In review, per notes, she had a node that was removed from the left neck and axilla 15 and 20 years ago. She was told that the areas were benign. These areas were once again present around March 2013. Thus, a CT scan of the abdomen and pelvis on 10/02/2011 that noted shotty bilateral inguinal nodes and a hiatal hernia, otherwise negative. The liver, spleen and pancreas were unremarkable. There was no mesenteric or retroperitoneal adenopathy. She was referred to Dr. Neldon Mc who, on 11/18/2011, performed an excisional biopsy of right inguinal node. Surgical pathology revealed atypical follicular proliferation which was positive for BCl-2 and CD20. The overall impression was that this was clearly involvement of follicular lymphoma. (Case number [id number]). She was initiated on Rituxan treatments, 375 mg/m2=600 mg q wk x 4, From 01/30/2012-02/20/12. PET scan on 12/23/11 had shown axillary, subpectoral and inguinal lymphadenopathy. A PET scan on 02/19/12 did show a complete metabolic response to therapy with only 1 focus of very mild hypermetabolic activity in the right axilla.She is now on close surveillance. She has remained clinically stable.  2. Hypertension.  3.osteoarthritis  4. Osteoporosis  5. Irritable bowel syndrome  6. COPD  7. GERD  8. mitral valve prolapse  9. Hiatal hernia  REVIEW OF SYSTEMS:   Constitutional: Denies fevers, chills or abnormal weight loss Eyes: Denies blurriness of vision Ears, nose, mouth, throat, and  face: Denies mucositis or sore throat Respiratory: Denies cough, dyspnea or wheezes Cardiovascular: Denies palpitation, chest discomfort or lower extremity swelling Gastrointestinal:  Denies nausea, heartburn or change in bowel habits Skin: Denies abnormal  skin rashes Lymphatics: Denies new lymphadenopathy or easy bruising Neurological:Denies numbness, tingling or new weaknesses Behavioral/Psych: Mood is stable, no new changes  All other systems were reviewed with the patient and are negative.  PHYSICAL EXAMINATION: ECOG PERFORMANCE STATUS: 0 - Asymptomatic  Blood pressure 185/78, pulse 56, temperature 96.8 F (36 C), temperature source Oral, resp. rate 18, height 5\' 4"  (1.626 m), weight 117 lb 4.8 oz (53.207 kg).  GENERAL:alert, no distress and comfortable;. Patient looks younger than stated age. well-developed and thin  SKIN: skin color, texture, turgor are normal, no rashes or significant lesions EYES: normal, Conjunctiva are pink and non-injected, sclera clear OROPHARYNX:no exudate, no erythema and lips, buccal mucosa, and tongue normal  NECK: supple, thyroid normal size, non-tender, without nodularity LYMPH:  no palpable lymphadenopathy in the axillary or supraclavicular; inguinal region, palpable shotty lymph nodes are noted, less than 1cm. One less than 1 cm palpable node is in left cervical region  LUNGS: clear to auscultation and percussion with normal breathing effort HEART: regular rate & rhythm and no murmurs and no lower extremity edema ABDOMEN:abdomen soft, non-tender and normal bowel sounds Musculoskeletal:no cyanosis of digits and no clubbing  NEURO: alert & oriented x 3 with fluent speech, no focal motor/sensory deficits  LABORATORY DATA: Results for orders placed in visit on 08/26/13 (from the past 48 hour(s))  CBC WITH DIFFERENTIAL     Status: Abnormal   Collection Time    08/26/13  8:58 AM      Result Value Range   WBC 4.2  3.9 - 10.3 10e3/uL   NEUT# 2.1  1.5 - 6.5 10e3/uL   HGB 11.8  11.6 - 15.9 g/dL   HCT 35.1  34.8 - 46.6 %   Platelets 149  145 - 400 10e3/uL   MCV 98.5  79.5 - 101.0 fL   MCH 33.1  25.1 - 34.0 pg   MCHC 33.6  31.5 - 36.0 g/dL   RBC 3.56 (*) 3.70 - 5.45 10e6/uL   RDW 12.9  11.2 - 14.5 %    lymph# 1.1  0.9 - 3.3 10e3/uL   MONO# 0.5  0.1 - 0.9 10e3/uL   Eosinophils Absolute 0.5  0.0 - 0.5 10e3/uL   Basophils Absolute 0.1  0.0 - 0.1 10e3/uL   NEUT% 49.4  38.4 - 76.8 %   LYMPH% 25.7  14.0 - 49.7 %   MONO% 12.1  0.0 - 14.0 %   EOS% 10.9 (*) 0.0 - 7.0 %   BASO% 1.9  0.0 - 2.0 %  COMPREHENSIVE METABOLIC PANEL (0000000)     Status: Abnormal   Collection Time    08/26/13  8:58 AM      Result Value Range   Sodium 141  136 - 145 mEq/L   Potassium 4.2  3.5 - 5.1 mEq/L   Chloride 104  98 - 109 mEq/L   CO2 30 (*) 22 - 29 mEq/L   Glucose 74  70 - 140 mg/dl   BUN 27.9 (*) 7.0 - 26.0 mg/dL   Creatinine 1.1  0.6 - 1.1 mg/dL   Total Bilirubin 0.54  0.20 - 1.20 mg/dL   Alkaline Phosphatase 61  40 - 150 U/L   AST 21  5 - 34 U/L   ALT 14  0 - 55 U/L  Total Protein 7.4  6.4 - 8.3 g/dL   Albumin 3.7  3.5 - 5.0 g/dL   Calcium 9.9  8.4 - 10.4 mg/dL   Anion Gap 8  3 - 11 mEq/L  LACTATE DEHYDROGENASE (CC13)     Status: None   Collection Time    08/26/13  8:58 AM      Result Value Range   LDH 158  125 - 245 U/L    Labs:  Lab Results  Component Value Date   WBC 4.2 08/26/2013   HGB 11.8 08/26/2013   HCT 35.1 08/26/2013   MCV 98.5 08/26/2013   PLT 149 08/26/2013   NEUTROABS 2.1 08/26/2013      Chemistry      Component Value Date/Time   NA 141 08/26/2013 0858   NA 136 08/14/2012 0845   K 4.2 08/26/2013 0858   K 4.0 08/14/2012 0845   CL 103 10/09/2012 1036   CL 98 08/14/2012 0845   CO2 30* 08/26/2013 0858   CO2 30 08/14/2012 0845   BUN 27.9* 08/26/2013 0858   BUN 22 08/14/2012 0845   CREATININE 1.1 08/26/2013 0858   CREATININE 1.11* 08/14/2012 0845      Component Value Date/Time   CALCIUM 9.9 08/26/2013 0858   CALCIUM 10.0 08/14/2012 0845   ALKPHOS 61 08/26/2013 0858   ALKPHOS 64 08/14/2012 0845   AST 21 08/26/2013 0858   AST 25 08/14/2012 0845   ALT 14 08/26/2013 0858   ALT 13 08/14/2012 0845   BILITOT 0.54 08/26/2013 0858   BILITOT 0.4 08/14/2012 0845     Basic Metabolic Panel:  Recent Labs Lab  08/26/13 0858  NA 141  K 4.2  CO2 30*  GLUCOSE 74  BUN 27.9*  CREATININE 1.1  CALCIUM 9.9   GFR Estimated Creatinine Clearance: 31.4 ml/min (by C-G formula based on Cr of 1.1). Liver Function Tests:  Recent Labs Lab 08/26/13 0858  AST 21  ALT 14  ALKPHOS 61  BILITOT 0.54  PROT 7.4  ALBUMIN 3.7   CBC:  Recent Labs Lab 08/26/13 0858  WBC 4.2  NEUTROABS 2.1  HGB 11.8  HCT 35.1  MCV 98.5  PLT 149    Studies:  1.PET scan 02/19/2012: complete metabolic response to therapy. There is one focus of very mild hypermetabolic activity in the right axilla, however, this appears  to correspond to the right subclavian vessels better than it does the minimally enlarged right axillary lymph nodes. 2. Other than some borderline enlarged and mildly enlarged bilateral axillary lymph nodes (right greater than left), no other definite lymphadenopathy is identified on today's examination.  2.CT of the chest abdomen and pelvis with contrast on 03/31/12 Borderline enlarged and mildly enlarged right axillary lymph nodes appear unchanged and are nonspecific.  No other evidence to suggest lymphoma recurrence in the thoraxThe appearance of the lungs is unchanged, as discussed above. 4. Atherosclerosis.  3. Mammogram on 03/10/2012, negative for malignancy  4. PET scan on 12/11/2011 showing Mildly enlarged and mildly hypermetabolic axillary and sub pectoralis lymph nodes consistent with low grade lymphoma.  Mildly hypermetabolic superficial inguinal lymph nodes also consistent with low grade lymphoma. No retroperitoneal or mediastinal hypermetabolic nodes. Spleen appears normal. No focal abnormal activity within the marrow.  5.CT of the chest with contrast 12/11/2011 showing right suprapectoral and less so axillary nodes highly suspicious for active lymphoma. Left axillary and subpectoral nodes are smaller and indeterminate. No other areas of thoracic adenopathy  6.CT of the abdomen and pelvis with  contrast 09/30/2011 showing no acute findings or other significant abnormality. Showed the bilateral inguinal lymph nodes, not pathologically enlarged, no evidence of inguinal soft tissue mass  7. Bone density scan 04/11/2011, showing lumbar BMD 0.775, young adult T score -2.8 and the Z score 0.1,left femoral neck Bone Mineral Density (BMD): 0.647 g/cm2  Young Adult T Score: -1.8 Z Score: 0.6 statistically significant 5.8% increase in BMD in the spine and no significant change in BMD in the total left hip as compared to baseline study dated 03/25/2007. Change in the spine may reflect increasing degenerative change. There has been no significant change in BMD in the spine or total left hip as compared to 04/10/2009.  8. Excisional biopsy of a right inguinal lymph node 11/18/2011, with pathology report as follows:  Tissue-Flow Cytometry  - FLOW CYTOMETRIC ANALYSIS OF THE LYMPHOCYTES PRESENT IN THE POPULATION FAILS TOHIGHLIGHT THE PRESENCE OF A MONOCLONAL B CELL POPULATION OR ABNORMAL T CELLPOPULATION. PLEASE ALSO SEE THE PATIENT'S CONCORDANT SURGICAL PATHOLOGYSPECIMEN, [id number]. (JBK:GT, 11/22/11)  Enid Cutter MD  Pathologist, Electronic Signature  (Case signed 11/22/2011)  GROSS AND MICROSCOPIC INFORMATION  Source  Tissue-Flow Cytometry  Microscopic  Gated population: Flow cytometric immunophenotyping is performed using antibiodies to the antigens listed in the table below. Electronic gates are placed around a cell cluster displaying light scatter propertiescorresponding to lymphocytes.- Abnormal Cells in gated population: NA- Phenotype of Abnormal Cells: NA  1 of 2  FINAL for Zendejas, Menaal P [id number])  FINAL DIAGNOSIS  Diagnosis  Lymph node for lymphoma, Right groin  - ATYPICAL FOLLICULAR PROLIFERATION.  - SEE COMMENT.  Microscopic Comment  The specimen reveals a lymph node with preserved architecture and background follicular hyperplasia. There are some follicles with atypical features,  including lack of polarity and relatively monotonous architecture.These follicles are positive for BCL2. These findings are highly suggestive of partial/early involvement byfollicular lymphoma. CD3 highlights the presence of T-cells and CD-20 highlights the presence of B-cells.(JK:mw 11-22-11)JOSHUA KISH MD  Pathologist, Electronic Signature  (Case signed 11/22/2011)    RADIOGRAPHIC STUDIES: Carroll R  05/06/2013   CLINICAL DATA:  Patient was recalled from screening for possible right breast architectural distortion.  EXAM: DIGITAL DIAGNOSTIC  right MAMMOGRAM  COMPARISON:  Prior exams  ACR Breast Density Category c: The breasts are heterogeneously dense, which may obscure small masses.  FINDINGS: Questioned architectural distortion within the right breast resolved with additional imaging.  Mammographic images were processed with CAD.  IMPRESSION: Questioned architectural distortion within the right breast resolved with additional imaging. No mammographic evidence for malignancy.  RECOMMENDATION: Screening mammogram in one year.(Code:SM-B-01Y)  I have discussed the findings and recommendations with the patient. Results were also provided in writing at the conclusion of the visit. If applicable, a reminder letter will be sent to the patient regarding the next appointment.  BI-RADS CATEGORY  1: Negative   Electronically Signed   By: Lovey Newcomer M.D.   On: 05/06/2013 10:48    ASSESSMENT: [name] 80 y.o. female with a history of Follicular lymphoma - Plan: CBC with Differential, Lactate dehydrogenase (LDH) - [facility]  Lymphadenopathy  PLAN:  1. Follicular Lymphoma. --Ms. Barks is an 80 year old woman with [id number] follicular lymphoma. She continues to well. She completed 4 weekly treatments of Rituxan from 01/30/2012 through 02/20/2012. A followup A PET scan favors a complete metabolic response to therapy with mild adenopathy in both axillae, otherwise rest of exam was remarkable.  She is feeling well, has no new complaints, no fever  or chills no night sweats, she remains active with good energy level. Her appetite stable and her weight is stable as well.   We would recommend continue surveillance. She will return in 3 months' time with CBC, CMET, LDH.   All questions were answered. The patient knows to call the clinic with any problems, questions or concerns. We can certainly see the patient much sooner if necessary.  I spent 10 minutes counseling the patient face to face. The total time spent in the appointment was 15 minutes.    Chelsea Pedretti, MD 08/27/2013 4:20 AM

## 2013-11-25 ENCOUNTER — Telehealth: Payer: Self-pay | Admitting: Internal Medicine

## 2013-11-25 ENCOUNTER — Ambulatory Visit (HOSPITAL_BASED_OUTPATIENT_CLINIC_OR_DEPARTMENT_OTHER): Payer: Medicare Other | Admitting: Internal Medicine

## 2013-11-25 ENCOUNTER — Encounter: Payer: Self-pay | Admitting: Internal Medicine

## 2013-11-25 ENCOUNTER — Other Ambulatory Visit (HOSPITAL_BASED_OUTPATIENT_CLINIC_OR_DEPARTMENT_OTHER): Payer: Medicare Other

## 2013-11-25 VITALS — BP 176/81 | HR 58 | Temp 97.9°F | Resp 18 | Ht 64.0 in | Wt 117.1 lb

## 2013-11-25 DIAGNOSIS — C8299 Follicular lymphoma, unspecified, extranodal and solid organ sites: Secondary | ICD-10-CM

## 2013-11-25 DIAGNOSIS — R599 Enlarged lymph nodes, unspecified: Secondary | ICD-10-CM

## 2013-11-25 DIAGNOSIS — C8290 Follicular lymphoma, unspecified, unspecified site: Secondary | ICD-10-CM

## 2013-11-25 DIAGNOSIS — R591 Generalized enlarged lymph nodes: Secondary | ICD-10-CM

## 2013-11-25 LAB — CBC WITH DIFFERENTIAL/PLATELET
BASO%: 1.4 % (ref 0.0–2.0)
Basophils Absolute: 0.1 10*3/uL (ref 0.0–0.1)
EOS ABS: 0.8 10*3/uL — AB (ref 0.0–0.5)
EOS%: 17.8 % — ABNORMAL HIGH (ref 0.0–7.0)
HEMATOCRIT: 35.1 % (ref 34.8–46.6)
HGB: 11.5 g/dL — ABNORMAL LOW (ref 11.6–15.9)
LYMPH%: 24.9 % (ref 14.0–49.7)
MCH: 32.4 pg (ref 25.1–34.0)
MCHC: 32.8 g/dL (ref 31.5–36.0)
MCV: 98.8 fL (ref 79.5–101.0)
MONO#: 0.4 10*3/uL (ref 0.1–0.9)
MONO%: 9.6 % (ref 0.0–14.0)
NEUT%: 46.3 % (ref 38.4–76.8)
NEUTROS ABS: 2.1 10*3/uL (ref 1.5–6.5)
PLATELETS: 154 10*3/uL (ref 145–400)
RBC: 3.55 10*6/uL — ABNORMAL LOW (ref 3.70–5.45)
RDW: 13.3 % (ref 11.2–14.5)
WBC: 4.6 10*3/uL (ref 3.9–10.3)
lymph#: 1.1 10*3/uL (ref 0.9–3.3)

## 2013-11-25 LAB — LACTATE DEHYDROGENASE (CC13): LDH: 172 U/L (ref 125–245)

## 2013-11-25 NOTE — Progress Notes (Signed)
[place] OFFICE PROGRESS NOTE  Simona Huh, MD [address], [address] [place] [id number]  DIAGNOSIS: Follicular lymphoma - Plan: CBC with Differential, Comprehensive metabolic panel (Cmet) - [facility], Lactate dehydrogenase (LDH) - [facility]  Lymphadenopathy - Plan: CBC with Differential, Comprehensive metabolic panel (Cmet) - [facility], Lactate dehydrogenase (LDH) - [facility]  Chief Complaint  Patient presents with  . Follicular lymphoma    CURRENT THERAPY: Surveillance.   INTERVAL HISTORY: [name] 80 y.o. female with a history of follicular lymphoma who has been followed in our office since May 2013 who is here for follow-up.  She was last seen by me on 08/26/2013.  Her history is as noted below. She continues to be very active in her garden. She denies any recent hospitalizations or emergency room visits.  She also denies fevers or chills or acute shortness of breath.   MEDICAL HISTORY: Past Medical History  Diagnosis Date  . Hypertension   . COPD (chronic obstructive pulmonary disease)   . GERD (gastroesophageal reflux disease)   . MVP (mitral valve prolapse)   . Depression   . Allergy   . Osteoporosis   . DJD (degenerative joint disease)   . Hx of colonic polyp 10/02/05  . Heart murmur     had echo 20 yr ago-not sure where  . Cancer     follicular lymphoma    INTERIM HISTORY: has Lymphadenopathy and Follicular lymphoma on her problem list.    ALLERGIES:  is allergic to amoxicillin; aspirin; benadryl; ibuprofen; sulfa antibiotics; sulindac; and penicillins.  MEDICATIONS: has a current medication list which includes the following prescription(s): acetaminophen, atenolol, calcium-d, chlordiazepoxide, dextromethorphan-guaifenesin, fluticasone, hydrochlorothiazide, hydrocodone-acetaminophen, hyoscyamine, meclizine, multivitamin, nabumetone, propylene glycol, ranitidine, and sertraline.  SURGICAL HISTORY:  Past Surgical History  Procedure Laterality  Date  . Cataract extraction      right eye  . Dilation and curettage of uterus      several  . Ovarian cyst removal    . Appendectomy    . Colonoscopy     PROBLEM LIST: 1. History of Follicular Lymphoma: She was first seen at our office on 12/11/11.In review, per notes, she had a node that was removed from the left neck and axilla 15 and 20 years ago. She was told that the areas were benign. These areas were once again present around March 2013. Thus, a CT scan of the abdomen and pelvis on 10/02/2011 that noted shotty bilateral inguinal nodes and a hiatal hernia, otherwise negative. The liver, spleen and pancreas were unremarkable. There was no mesenteric or retroperitoneal adenopathy. She was referred to Dr. Neldon Mc who, on 11/18/2011, performed an excisional biopsy of right inguinal node. Surgical pathology revealed atypical follicular proliferation which was positive for BCl-2 and CD20. The overall impression was that this was clearly involvement of follicular lymphoma. (Case number [id number]). She was initiated on Rituxan treatments, 375 mg/m2=600 mg q wk x 4, From 01/30/2012-02/20/12. PET scan on 12/23/11 had shown axillary, subpectoral and inguinal lymphadenopathy. A PET scan on 02/19/12 did show a complete metabolic response to therapy with only 1 focus of very mild hypermetabolic activity in the right axilla.She is now on close surveillance. She has remained clinically stable.  2. Hypertension.  3.osteoarthritis  4. Osteoporosis  5. Irritable bowel syndrome  6. COPD  7. GERD  8. mitral valve prolapse  9. Hiatal hernia  REVIEW OF SYSTEMS:   Constitutional: Denies fevers, chills or abnormal weight loss Eyes: Denies blurriness of vision  Ears, nose, mouth, throat, and face: Denies mucositis or sore throat Respiratory: Denies cough, dyspnea or wheezes Cardiovascular: Denies palpitation, chest discomfort or lower extremity swelling Gastrointestinal:  Denies nausea, heartburn or  change in bowel habits Skin: Denies abnormal skin rashes Lymphatics: Denies new lymphadenopathy or easy bruising Neurological:Denies numbness, tingling or new weaknesses Behavioral/Psych: Mood is stable, no new changes  All other systems were reviewed with the patient and are negative.  PHYSICAL EXAMINATION: ECOG PERFORMANCE STATUS: 0 - Asymptomatic  Blood pressure 176/81, pulse 58, temperature 97.9 F (36.6 C), temperature source Oral, resp. rate 18, height 5\' 4"  (1.626 m), weight 117 lb 1.6 oz (53.116 kg).  GENERAL:alert, no distress and comfortable;. Patient looks younger than stated age. well-developed and thin  SKIN: skin color, texture, turgor are normal, no rashes or significant lesions EYES: normal, Conjunctiva are pink and non-injected, sclera clear OROPHARYNX:no exudate, no erythema and lips, buccal mucosa, and tongue normal  NECK: supple, thyroid normal size, non-tender, without nodularity LYMPH:  no palpable lymphadenopathy in the axillary or supraclavicular; inguinal region, palpable shotty lymph nodes are noted, less than 1cm. One less than 1 cm palpable node is in left cervical region  LUNGS: clear to auscultation and percussion with normal breathing effort HEART: regular rate & rhythm and no murmurs and no lower extremity edema ABDOMEN:abdomen soft, non-tender and normal bowel sounds Musculoskeletal:no cyanosis of digits and no clubbing  NEURO: alert & oriented x 3 with fluent speech, no focal motor/sensory deficits  LABORATORY DATA: Results for orders placed in visit on 11/25/13 (from the past 48 hour(s))  CBC WITH DIFFERENTIAL     Status: Abnormal   Collection Time    11/25/13  9:10 AM      Result Value Ref Range   WBC 4.6  3.9 - 10.3 10e3/uL   NEUT# 2.1  1.5 - 6.5 10e3/uL   HGB 11.5 (*) 11.6 - 15.9 g/dL   HCT 35.1  34.8 - 46.6 %   Platelets 154  145 - 400 10e3/uL   MCV 98.8  79.5 - 101.0 fL   MCH 32.4  25.1 - 34.0 pg   MCHC 32.8  31.5 - 36.0 g/dL   RBC 3.55  (*) 3.70 - 5.45 10e6/uL   RDW 13.3  11.2 - 14.5 %   lymph# 1.1  0.9 - 3.3 10e3/uL   MONO# 0.4  0.1 - 0.9 10e3/uL   Eosinophils Absolute 0.8 (*) 0.0 - 0.5 10e3/uL   Basophils Absolute 0.1  0.0 - 0.1 10e3/uL   NEUT% 46.3  38.4 - 76.8 %   LYMPH% 24.9  14.0 - 49.7 %   MONO% 9.6  0.0 - 14.0 %   EOS% 17.8 (*) 0.0 - 7.0 %   BASO% 1.4  0.0 - 2.0 %  LACTATE DEHYDROGENASE (CC13)     Status: None   Collection Time    11/25/13  9:10 AM      Result Value Ref Range   LDH 172  125 - 245 U/L    Labs:  Lab Results  Component Value Date   WBC 4.6 11/25/2013   HGB 11.5* 11/25/2013   HCT 35.1 11/25/2013   MCV 98.8 11/25/2013   PLT 154 11/25/2013   NEUTROABS 2.1 11/25/2013      Chemistry      Component Value Date/Time   NA 141 08/26/2013 0858   NA 136 08/14/2012 0845   K 4.2 08/26/2013 0858   K 4.0 08/14/2012 0845   CL 103 10/09/2012 1036  CL 98 08/14/2012 0845   CO2 30* 08/26/2013 0858   CO2 30 08/14/2012 0845   BUN 27.9* 08/26/2013 0858   BUN 22 08/14/2012 0845   CREATININE 1.1 08/26/2013 0858   CREATININE 1.11* 08/14/2012 0845      Component Value Date/Time   CALCIUM 9.9 08/26/2013 0858   CALCIUM 10.0 08/14/2012 0845   ALKPHOS 61 08/26/2013 0858   ALKPHOS 64 08/14/2012 0845   AST 21 08/26/2013 0858   AST 25 08/14/2012 0845   ALT 14 08/26/2013 0858   ALT 13 08/14/2012 0845   BILITOT 0.54 08/26/2013 0858   BILITOT 0.4 08/14/2012 0845     CBC:  Recent Labs Lab 11/25/13 0910  WBC 4.6  NEUTROABS 2.1  HGB 11.5*  HCT 35.1  MCV 98.8  PLT 154    Studies:  1.PET scan 02/19/2012: complete metabolic response to therapy. There is one focus of very mild hypermetabolic activity in the right axilla, however, this appears  to correspond to the right subclavian vessels better than it does the minimally enlarged right axillary lymph nodes. 2. Other than some borderline enlarged and mildly enlarged bilateral axillary lymph nodes (right greater than left), no other definite lymphadenopathy is identified on today's  examination.  2.CT of the chest abdomen and pelvis with contrast on 03/31/12 Borderline enlarged and mildly enlarged right axillary lymph nodes appear unchanged and are nonspecific.  No other evidence to suggest lymphoma recurrence in the thoraxThe appearance of the lungs is unchanged, as discussed above. 4. Atherosclerosis.  3. Mammogram on 03/10/2012, negative for malignancy  4. PET scan on 12/11/2011 showing Mildly enlarged and mildly hypermetabolic axillary and sub pectoralis lymph nodes consistent with low grade lymphoma.  Mildly hypermetabolic superficial inguinal lymph nodes also consistent with low grade lymphoma. No retroperitoneal or mediastinal hypermetabolic nodes. Spleen appears normal. No focal abnormal activity within the marrow.  5.CT of the chest with contrast 12/11/2011 showing right suprapectoral and less so axillary nodes highly suspicious for active lymphoma. Left axillary and subpectoral nodes are smaller and indeterminate. No other areas of thoracic adenopathy  6.CT of the abdomen and pelvis with contrast 09/30/2011 showing no acute findings or other significant abnormality. Showed the bilateral inguinal lymph nodes, not pathologically enlarged, no evidence of inguinal soft tissue mass  7. Bone density scan 04/11/2011, showing lumbar BMD 0.775, young adult T score -2.8 and the Z score 0.1,left femoral neck Bone Mineral Density (BMD): 0.647 g/cm2  Young Adult T Score: -1.8 Z Score: 0.6 statistically significant 5.8% increase in BMD in the spine and no significant change in BMD in the total left hip as compared to baseline study dated 03/25/2007. Change in the spine may reflect increasing degenerative change. There has been no significant change in BMD in the spine or total left hip as compared to 04/10/2009.  8. Excisional biopsy of a right inguinal lymph node 11/18/2011, with pathology report as follows:   Tissue-Flow Cytometry  - FLOW CYTOMETRIC ANALYSIS OF THE LYMPHOCYTES PRESENT  IN THE POPULATION FAILS TOHIGHLIGHT THE PRESENCE OF A MONOCLONAL B CELL POPULATION OR ABNORMAL T CELLPOPULATION. PLEASE ALSO SEE THE PATIENT'S CONCORDANT SURGICAL PATHOLOGYSPECIMEN, [id number]. (JBK:GT, 11/22/11) Enid Cutter MD  Pathologist, Electronic Signature (Case signed 11/22/2011) GROSS AND MICROSCOPIC INFORMATION Source Tissue-Flow Cytometry Microscopic Gated population: Flow cytometric immunophenotyping is performed using antibiodies to the antigens listed in the table below. Electronic gates are placed around a cell cluster displaying light scatter propertiescorresponding to lymphocytes.- Abnormal Cells in gated population: NA- Phenotype of Abnormal Cells: NA  FINAL DIAGNOSIS  Diagnosis  Lymph node for lymphoma, Right groin - ATYPICAL FOLLICULAR PROLIFERATION.  - SEE COMMENT. Microscopic Comment The specimen reveals a lymph node with preserved architecture and background follicular hyperplasia. There are some follicles with atypical features, including lack of polarity and relatively monotonous architecture.These follicles are positive for BCL2. These findings are highly suggestive of partial/early involvement byfollicular lymphoma. CD3 highlights the presence of T-cells and CD-20 highlights the presence of B-cells.(JK:mw 11-22-11)JOSHUA KISH MD  Pathologist, Electronic Signature (Case signed 11/22/2011)    RADIOGRAPHIC STUDIES: [place]  05/06/2013   CLINICAL DATA:  Patient was recalled from screening for possible right breast architectural distortion.  EXAM: DIGITAL DIAGNOSTIC  right MAMMOGRAM  COMPARISON:  Prior exams  ACR Breast Density Category c: The breasts are heterogeneously dense, which may obscure small masses.  FINDINGS: Questioned architectural distortion within the right breast resolved with additional imaging.  Mammographic images were processed with CAD.  IMPRESSION: Questioned architectural distortion within the right breast resolved with additional imaging. No  mammographic evidence for malignancy.  RECOMMENDATION: Screening mammogram in one year.(Code:SM-B-01Y)  I have discussed the findings and recommendations with the patient. Results were also provided in writing at the conclusion of the visit. If applicable, a reminder letter will be sent to the patient regarding the next appointment.  BI-RADS CATEGORY  1: Negative   Electronically Signed   By: Lovey Newcomer M.D.   On: 05/06/2013 10:48    ASSESSMENT: [name] 80 y.o. female with a history of Follicular lymphoma - Plan: CBC with Differential, Comprehensive metabolic panel (Cmet) - [facility], Lactate dehydrogenase (LDH) - [facility]  Lymphadenopathy - Plan: CBC with Differential, Comprehensive metabolic panel (Cmet) - [facility], Lactate dehydrogenase (LDH) - [facility]  PLAN:  1. Follicular Lymphoma. --[name] is an 80 year old woman with [id number] follicular lymphoma. She continues to well. She completed 4 weekly treatments of Rituxan from 01/30/2012 through 02/20/2012. A followup A PET scan favors a complete metabolic response to therapy with mild adenopathy in both axillae, otherwise rest of exam was remarkable. She is feeling well, has no new complaints, no fever or chills no night sweats, she remains active with good energy level. Her appetite stable and her weight is stable as well.   We would recommend continue surveillance. She will return in 3 months' time with CBC, CMET, LDH.   All questions were answered. The patient knows to call the clinic with any problems, questions or concerns. We can certainly see the patient much sooner if necessary.  I spent 10 minutes counseling the patient face to face. The total time spent in the appointment was 15 minutes.    Concha Norway, MD 11/25/2013 2:28 PM

## 2013-11-25 NOTE — Telephone Encounter (Signed)
Gave pt appt for lab and MD for September 2015

## 2014-03-29 ENCOUNTER — Ambulatory Visit (HOSPITAL_BASED_OUTPATIENT_CLINIC_OR_DEPARTMENT_OTHER): Payer: Medicare Other | Admitting: Hematology

## 2014-03-29 ENCOUNTER — Telehealth: Payer: Self-pay | Admitting: Hematology

## 2014-03-29 ENCOUNTER — Other Ambulatory Visit (HOSPITAL_BASED_OUTPATIENT_CLINIC_OR_DEPARTMENT_OTHER): Payer: Medicare Other

## 2014-03-29 ENCOUNTER — Ambulatory Visit: Payer: Medicare Other

## 2014-03-29 VITALS — BP 182/77 | HR 51 | Temp 98.3°F | Resp 18 | Ht 64.0 in | Wt 117.7 lb

## 2014-03-29 DIAGNOSIS — C8299 Follicular lymphoma, unspecified, extranodal and solid organ sites: Secondary | ICD-10-CM

## 2014-03-29 DIAGNOSIS — R591 Generalized enlarged lymph nodes: Secondary | ICD-10-CM

## 2014-03-29 DIAGNOSIS — Z23 Encounter for immunization: Secondary | ICD-10-CM | POA: Diagnosis not present

## 2014-03-29 DIAGNOSIS — C8290 Follicular lymphoma, unspecified, unspecified site: Secondary | ICD-10-CM

## 2014-03-29 DIAGNOSIS — C8588 Other specified types of non-Hodgkin lymphoma, lymph nodes of multiple sites: Secondary | ICD-10-CM

## 2014-03-29 DIAGNOSIS — I10 Essential (primary) hypertension: Secondary | ICD-10-CM

## 2014-03-29 LAB — CBC WITH DIFFERENTIAL/PLATELET
BASO%: 1.9 % (ref 0.0–2.0)
Basophils Absolute: 0.1 10*3/uL (ref 0.0–0.1)
EOS%: 34.5 % — ABNORMAL HIGH (ref 0.0–7.0)
Eosinophils Absolute: 1.8 10*3/uL — ABNORMAL HIGH (ref 0.0–0.5)
HCT: 34.2 % — ABNORMAL LOW (ref 34.8–46.6)
HEMOGLOBIN: 11.1 g/dL — AB (ref 11.6–15.9)
LYMPH#: 1.4 10*3/uL (ref 0.9–3.3)
LYMPH%: 26.4 % (ref 14.0–49.7)
MCH: 31.5 pg (ref 25.1–34.0)
MCHC: 32.5 g/dL (ref 31.5–36.0)
MCV: 97.2 fL (ref 79.5–101.0)
MONO#: 0.3 10*3/uL (ref 0.1–0.9)
MONO%: 6.4 % (ref 0.0–14.0)
NEUT#: 1.7 10*3/uL (ref 1.5–6.5)
NEUT%: 30.8 % — AB (ref 38.4–76.8)
PLATELETS: 127 10*3/uL — AB (ref 145–400)
RBC: 3.52 10*6/uL — ABNORMAL LOW (ref 3.70–5.45)
RDW: 13.6 % (ref 11.2–14.5)
WBC: 5.3 10*3/uL (ref 3.9–10.3)

## 2014-03-29 LAB — COMPREHENSIVE METABOLIC PANEL (CC13)
ALT: 8 U/L (ref 0–55)
ANION GAP: 7 meq/L (ref 3–11)
AST: 21 U/L (ref 5–34)
Albumin: 3.5 g/dL (ref 3.5–5.0)
Alkaline Phosphatase: 68 U/L (ref 40–150)
BUN: 26.2 mg/dL — ABNORMAL HIGH (ref 7.0–26.0)
CALCIUM: 9.3 mg/dL (ref 8.4–10.4)
CHLORIDE: 107 meq/L (ref 98–109)
CO2: 28 mEq/L (ref 22–29)
Creatinine: 1.2 mg/dL — ABNORMAL HIGH (ref 0.6–1.1)
Glucose: 80 mg/dl (ref 70–140)
Potassium: 3.5 mEq/L (ref 3.5–5.1)
Sodium: 141 mEq/L (ref 136–145)
Total Bilirubin: 0.43 mg/dL (ref 0.20–1.20)
Total Protein: 7.5 g/dL (ref 6.4–8.3)

## 2014-03-29 LAB — LACTATE DEHYDROGENASE (CC13): LDH: 182 U/L (ref 125–245)

## 2014-03-29 MED ORDER — INFLUENZA VAC SPLIT QUAD 0.5 ML IM SUSY
0.5000 mL | PREFILLED_SYRINGE | Freq: Once | INTRAMUSCULAR | Status: AC
  Administered 2014-03-29: 0.5 mL via INTRAMUSCULAR
  Filled 2014-03-29: qty 0.5

## 2014-03-29 NOTE — Telephone Encounter (Signed)
gv pt appt schedule for march 2016 and mammo 04/25/14.

## 2014-03-29 NOTE — Telephone Encounter (Signed)
gv pt appt schedule for march 2016 °

## 2014-03-30 ENCOUNTER — Encounter: Payer: Self-pay | Admitting: Hematology

## 2014-03-30 DIAGNOSIS — I10 Essential (primary) hypertension: Secondary | ICD-10-CM | POA: Insufficient documentation

## 2014-03-30 NOTE — Progress Notes (Signed)
[facility] OFFICE PROGRESS NOTE  03/29/2014  Simona Huh, MD [address], [address] [place] [id number]  DIAGNOSIS: FOLLICULAR  Chief Complaint  Patient presents with  . Follow-up    CURRENT THERAPY: Surveillance.   INTERVAL HISTORY: [name] 80 y.o. female with a history of follicular lymphoma who has been followed in our office since May 2013 who is here for follow-up.  She was last seen by Dr Juliann Mule on 11/25/2013.  Her history is as noted below. She continues to be very active in her garden. She denies any recent hospitalizations or emergency room visits.  She also denies fevers, night sweats or chills or acute shortness of breath. She has gained about 10 lbs which she attributes to eating healthy diet "i love soups and salads". Certain food cause me diarrhea so I avoid it She has mild complaints of arthritis. She had labs done today in office.she also got a flu shot in office.  MEDICAL HISTORY: Past Medical History  Diagnosis Date  . Hypertension   . COPD (chronic obstructive pulmonary disease)   . GERD (gastroesophageal reflux disease)   . MVP (mitral valve prolapse)   . Depression   . Allergy   . Osteoporosis   . DJD (degenerative joint disease)   . Hx of colonic polyp 10/02/05  . Heart murmur     had echo 20 yr ago-not sure where  . Cancer     follicular lymphoma    INTERIM HISTORY: has Lymphadenopathy and Follicular lymphoma on her problem list.    ALLERGIES:  is allergic to amoxicillin; aspirin; benadryl; ibuprofen; sulfa antibiotics; sulindac; and penicillins.  MEDICATIONS: has a current medication list which includes the following prescription(s): acetaminophen, atenolol, calcium-d, chlordiazepoxide, dextromethorphan-guaifenesin, fluticasone, hydrochlorothiazide, hydrocodone-acetaminophen, hyoscyamine, meclizine, multivitamin, nabumetone, propylene glycol, ranitidine, and sertraline.  SURGICAL HISTORY:  Past Surgical History   Procedure Laterality Date  . Cataract extraction      right eye  . Dilation and curettage of uterus      several  . Ovarian cyst removal    . Appendectomy    . Colonoscopy     PROBLEM LIST: 1. History of Follicular Lymphoma: She was first seen at our office on 12/11/11.In review, per notes, she had a node that was removed from the left neck and axilla 15 and 20 years ago. She was told that the areas were benign. These areas were once again present around March 2013. Thus, a CT scan of the abdomen and pelvis on 10/02/2011 that noted shotty bilateral inguinal nodes and a hiatal hernia, otherwise negative. The liver, spleen and pancreas were unremarkable. There was no mesenteric or retroperitoneal adenopathy. She was referred to Dr. Neldon Mc who, on 11/18/2011, performed an excisional biopsy of right inguinal node. Surgical pathology revealed atypical follicular proliferation which was positive for BCl-2 and CD20. The overall impression was that this was clearly involvement of follicular lymphoma. (Case number SZ [id number]). She was initiated on Rituxan treatments, 375 mg/m2=600 mg q wk x 4, From 01/30/2012-02/20/12. PET scan on 12/23/11 had shown axillary, subpectoral and inguinal lymphadenopathy. A PET scan on 02/19/12 did show a complete metabolic response to therapy with only 1 focus of very mild hypermetabolic activity in the right axilla.She is now on close surveillance. She has remained clinically stable.  2. Hypertension.  3.osteoarthritis  4. Osteoporosis  5. Irritable bowel syndrome  6. COPD  7. GERD  8. mitral valve prolapse  9. Hiatal hernia  REVIEW OF  SYSTEMS:   Constitutional: Denies fevers, chills or abnormal weight loss, gained 10 lbs Eyes: Denies blurriness of vision Ears, nose, mouth, throat, and face: Denies mucositis or sore throat Respiratory: Denies cough, dyspnea or wheezes Cardiovascular: Denies palpitation, chest discomfort or lower extremity  swelling Gastrointestinal:  Denies nausea, heartburn or change in bowel habits Skin: Denies abnormal skin rashes Lymphatics: Denies new lymphadenopathy or easy bruising Neurological:Denies numbness, tingling or new weaknesses Behavioral/Psych: Mood is stable, no new changes  All other systems were reviewed with the patient and are negative.  PHYSICAL EXAMINATION: ECOG PERFORMANCE STATUS: 1  Blood pressure 182/77, pulse 51, temperature 98.3 F (36.8 C), temperature source Oral, resp. rate 18, height 5\' 4"  (1.626 m), weight 117 lb 11.2 oz (53.388 kg). Repeat BP in office right 162/73 left side 178/76  GENERAL:alert, no distress and comfortable;. Patient looks younger than stated age. well-developed and thin  SKIN: skin color, texture, turgor are normal, no rashes or significant lesions EYES: normal, Conjunctiva are pink and non-injected, sclera clear OROPHARYNX:no exudate, no erythema and lips, buccal mucosa, and tongue normal  NECK: supple, thyroid normal size, non-tender, without nodularity LYMPH:  no palpable lymphadenopathy in the axillary or supraclavicular; inguinal region, palpable shotty lymph nodes are noted, less than 1cm. One less than 1 cm palpable node is in left cervical region  LUNGS: clear to auscultation and percussion with normal breathing effort HEART: regular rate & rhythm and no murmurs and no lower extremity edema ABDOMEN:abdomen soft, non-tender and normal bowel sounds Musculoskeletal:no cyanosis of digits and no clubbing  NEURO: alert & oriented x 3 with fluent speech, no focal motor/sensory deficits  LABORATORY DATA: Results for orders placed in visit on 03/29/14 (from the past 48 hour(s))  CBC WITH DIFFERENTIAL     Status: Abnormal   Collection Time    03/29/14  9:09 AM      Result Value Ref Range   WBC 5.3  3.9 - 10.3 10e3/uL   NEUT# 1.7  1.5 - 6.5 10e3/uL   HGB 11.1 (*) 11.6 - 15.9 g/dL   HCT 34.2 (*) 34.8 - 46.6 %   Platelets 127 (*) 145 - 400 10e3/uL    MCV 97.2  79.5 - 101.0 fL   MCH 31.5  25.1 - 34.0 pg   MCHC 32.5  31.5 - 36.0 g/dL   RBC 3.52 (*) 3.70 - 5.45 10e6/uL   RDW 13.6  11.2 - 14.5 %   lymph# 1.4  0.9 - 3.3 10e3/uL   MONO# 0.3  0.1 - 0.9 10e3/uL   Eosinophils Absolute 1.8 (*) 0.0 - 0.5 10e3/uL   Basophils Absolute 0.1  0.0 - 0.1 10e3/uL   NEUT% 30.8 (*) 38.4 - 76.8 %   LYMPH% 26.4  14.0 - 49.7 %   MONO% 6.4  0.0 - 14.0 %   EOS% 34.5 (*) 0.0 - 7.0 %   BASO% 1.9  0.0 - 2.0 %  LACTATE DEHYDROGENASE (CC13)     Status: None   Collection Time    03/29/14  9:09 AM      Result Value Ref Range   LDH 182  125 - 245 U/L  COMPREHENSIVE METABOLIC PANEL (0000000)     Status: Abnormal   Collection Time    03/29/14  9:09 AM      Result Value Ref Range   Sodium 141  136 - 145 mEq/L   Potassium 3.5  3.5 - 5.1 mEq/L   Chloride 107  98 - 109 mEq/L   CO2  28  22 - 29 mEq/L   Glucose 80  70 - 140 mg/dl   BUN 26.2 (*) 7.0 - 26.0 mg/dL   Creatinine 1.2 (*) 0.6 - 1.1 mg/dL   Total Bilirubin 0.43  0.20 - 1.20 mg/dL   Alkaline Phosphatase 68  40 - 150 U/L   AST 21  5 - 34 U/L   ALT 8  0 - 55 U/L   Total Protein 7.5  6.4 - 8.3 g/dL   Albumin 3.5  3.5 - 5.0 g/dL   Calcium 9.3  8.4 - 10.4 mg/dL   Anion Gap 7  3 - 11 mEq/L    Labs:  Lab Results  Component Value Date   WBC 5.3 03/29/2014   HGB 11.1* 03/29/2014   HCT 34.2* 03/29/2014   MCV 97.2 03/29/2014   PLT 127* 03/29/2014   NEUTROABS 1.7 03/29/2014      Chemistry      Component Value Date/Time   NA 141 03/29/2014 0909   NA 136 08/14/2012 0845   K 3.5 03/29/2014 0909   K 4.0 08/14/2012 0845   CL 103 10/09/2012 1036   CL 98 08/14/2012 0845   CO2 28 03/29/2014 0909   CO2 30 08/14/2012 0845   BUN 26.2* 03/29/2014 0909   BUN 22 08/14/2012 0845   CREATININE 1.2* 03/29/2014 0909   CREATININE 1.11* 08/14/2012 0845      Component Value Date/Time   CALCIUM 9.3 03/29/2014 0909   CALCIUM 10.0 08/14/2012 0845   ALKPHOS 68 03/29/2014 0909   ALKPHOS 64 08/14/2012 0845   AST 21 03/29/2014 0909   AST 25 08/14/2012  0845   ALT 8 03/29/2014 0909   ALT 13 08/14/2012 0845   BILITOT 0.43 03/29/2014 0909   BILITOT 0.4 08/14/2012 0845     CBC:  Recent Labs Lab 03/29/14 0909  WBC 5.3  NEUTROABS 1.7  HGB 11.1*  HCT 34.2*  MCV 97.2  PLT 127*    Studies:  1.PET scan 02/19/2012: complete metabolic response to therapy. There is one focus of very mild hypermetabolic activity in the right axilla, however, this appears  to correspond to the right subclavian vessels better than it does the minimally enlarged right axillary lymph nodes. 2. Other than some borderline enlarged and mildly enlarged bilateral axillary lymph nodes (right greater than left), no other definite lymphadenopathy is identified on today's examination.  2.CT of the chest abdomen and pelvis with contrast on 03/31/12 Borderline enlarged and mildly enlarged right axillary lymph nodes appear unchanged and are nonspecific.  No other evidence to suggest lymphoma recurrence in the thoraxThe appearance of the lungs is unchanged, as discussed above. 4. Atherosclerosis.  3. Mammogram on 03/10/2012, negative for malignancy  4. PET scan on 12/11/2011 showing Mildly enlarged and mildly hypermetabolic axillary and sub pectoralis lymph nodes consistent with low grade lymphoma.  Mildly hypermetabolic superficial inguinal lymph nodes also consistent with low grade lymphoma. No retroperitoneal or mediastinal hypermetabolic nodes. Spleen appears normal. No focal abnormal activity within the marrow.  5.CT of the chest with contrast 12/11/2011 showing right suprapectoral and less so axillary nodes highly suspicious for active lymphoma. Left axillary and subpectoral nodes are smaller and indeterminate. No other areas of thoracic adenopathy  6.CT of the abdomen and pelvis with contrast 09/30/2011 showing no acute findings or other significant abnormality. Showed the bilateral inguinal lymph nodes, not pathologically enlarged, no evidence of inguinal soft tissue mass  7. Bone  density scan 04/11/2011, showing lumbar BMD 0.775, young adult T  score -2.8 and the Z score 0.1,left femoral neck Bone Mineral Density (BMD): 0.647 g/cm2  Young Adult T Score: -1.8 Z Score: 0.6 statistically significant 5.8% increase in BMD in the spine and no significant change in BMD in the total left hip as compared to baseline study dated 03/25/2007. Change in the spine may reflect increasing degenerative change. There has been no significant change in BMD in the spine or total left hip as compared to 04/10/2009.  8. Excisional biopsy of a right inguinal lymph node 11/18/2011, with pathology report as follows:   Tissue-Flow Cytometry  - FLOW CYTOMETRIC ANALYSIS OF THE LYMPHOCYTES PRESENT IN THE POPULATION FAILS TOHIGHLIGHT THE PRESENCE OF A MONOCLONAL B CELL POPULATION OR ABNORMAL T CELLPOPULATION. PLEASE ALSO SEE THE PATIENT'S CONCORDANT SURGICAL PATHOLOGYSPECIMEN, [id number]. (JBK:GT, 11/22/11) Enid Cutter MD  Pathologist, Electronic Signature (Case signed 11/22/2011) GROSS AND MICROSCOPIC INFORMATION Source Tissue-Flow Cytometry Microscopic Gated population: Flow cytometric immunophenotyping is performed using antibiodies to the antigens listed in the table below. Electronic gates are placed around a cell cluster displaying light scatter propertiescorresponding to lymphocytes.- Abnormal Cells in gated population: NA- Phenotype of Abnormal Cells: NA    FINAL DIAGNOSIS  Diagnosis  Lymph node for lymphoma, Right groin - ATYPICAL FOLLICULAR PROLIFERATION. - SEE COMMENT. Microscopic Comment The specimen reveals a lymph node with preserved architecture and background follicular hyperplasia. There are some follicles with atypical features, including lack of polarity and relatively monotonous architecture.These follicles are positive for BCL2. These findings are highly suggestive of partial/early involvement by follicular lymphoma. CD3 highlights the presence of T-cells and CD-20 highlights the presence  of B-cells.(JK:mw 11-22-11)JOSHUA KISH MD  Pathologist, Electronic Signature (Case signed 11/22/2011)    RADIOGRAPHIC STUDIES: [place] R  05/06/2013   CLINICAL DATA:  Patient was recalled from screening for possible right breast architectural distortion.  EXAM: DIGITAL DIAGNOSTIC  right MAMMOGRAM  COMPARISON:  Prior exams  ACR Breast Density Category c: The breasts are heterogeneously dense, which may obscure small masses.  FINDINGS: Questioned architectural distortion within the right breast resolved with additional imaging.  Mammographic images were processed with CAD.  IMPRESSION: Questioned architectural distortion within the right breast resolved with additional imaging. No mammographic evidence for malignancy.  RECOMMENDATION: Screening mammogram in one year.(Code:SM-B-01Y)  I have discussed the findings and recommendations with the patient. Results were also provided in writing at the conclusion of the visit. If applicable, a reminder letter will be sent to the patient regarding the next appointment.  BI-RADS CATEGORY  1: Negative   Electronically Signed   By: Lovey Newcomer M.D.   On: 05/06/2013 10:48    ASSESSMENT: [name] 80 y.o. female with a history of Follicular lymphoma currently in remission. Her labs and physical exam is normal.  PLAN:  1. Follicular Lymphoma. --Ms. Stute is an 80 year old woman with [id number] follicular lymphoma. She continues to well. She completed 4 weekly treatments of Rituxan from 01/30/2012 through 02/20/2012. A followup A PET scan favors a complete metabolic response to therapy with mild adenopathy in both axillae, otherwise rest of exam was remarkable. She is feeling well, has no new complaints, no fever or chills no night sweats, she remains active with good energy level. Her appetite stable and her weight is better.   2. We would recommend continue surveillance. She will return in 6 months' time with CBC, CMET, LDH. Patient did get a flu  shot today.  3. She ask [place] to set up her mammogram and I placed an  order for 05/09/2014.  4. Patient's BP readings 3 were high in office and I will ask Dr Marisue Humble to look into that.  All questions were answered. The patient knows to call the clinic with any problems, questions or concerns. We can certainly see the patient much sooner if necessary.  I spent 20 minutes counseling the patient face to face. The total time spent in the appointment was 25 minutes.    Bernadene Bell, MD Medical Hematologist/Oncologist [place] Pager: [phone] Office No: [phone]

## 2014-04-25 ENCOUNTER — Ambulatory Visit
Admission: RE | Admit: 2014-04-25 | Discharge: 2014-04-25 | Disposition: A | Discharge status: Home / Self Care | Payer: Medicare Other | Source: Ambulatory Visit | Attending: Hematology | Admitting: Hematology

## 2014-04-25 DIAGNOSIS — C8598 Non-Hodgkin lymphoma, unspecified, lymph nodes of multiple sites: Secondary | ICD-10-CM

## 2014-06-07 ENCOUNTER — Emergency Department (HOSPITAL_COMMUNITY)
Admission: EM | Admit: 2014-06-07 | Discharge: 2014-06-07 | Disposition: A | Discharge status: Home / Self Care | Payer: Medicare Other | Attending: Emergency Medicine | Admitting: Emergency Medicine

## 2014-06-07 ENCOUNTER — Emergency Department (HOSPITAL_COMMUNITY): Payer: Medicare Other

## 2014-06-07 ENCOUNTER — Encounter (HOSPITAL_COMMUNITY): Payer: Self-pay | Admitting: *Deleted

## 2014-06-07 DIAGNOSIS — Z880 Allergy status to penicillin: Secondary | ICD-10-CM | POA: Diagnosis not present

## 2014-06-07 DIAGNOSIS — R011 Cardiac murmur, unspecified: Secondary | ICD-10-CM | POA: Diagnosis not present

## 2014-06-07 DIAGNOSIS — Z79899 Other long term (current) drug therapy: Secondary | ICD-10-CM | POA: Diagnosis not present

## 2014-06-07 DIAGNOSIS — M810 Age-related osteoporosis without current pathological fracture: Secondary | ICD-10-CM | POA: Insufficient documentation

## 2014-06-07 DIAGNOSIS — R42 Dizziness and giddiness: Secondary | ICD-10-CM | POA: Diagnosis not present

## 2014-06-07 DIAGNOSIS — Z791 Long term (current) use of non-steroidal anti-inflammatories (NSAID): Secondary | ICD-10-CM | POA: Diagnosis not present

## 2014-06-07 DIAGNOSIS — K219 Gastro-esophageal reflux disease without esophagitis: Secondary | ICD-10-CM | POA: Diagnosis not present

## 2014-06-07 DIAGNOSIS — I10 Essential (primary) hypertension: Secondary | ICD-10-CM | POA: Insufficient documentation

## 2014-06-07 DIAGNOSIS — Z86010 Personal history of colonic polyps: Secondary | ICD-10-CM | POA: Diagnosis not present

## 2014-06-07 DIAGNOSIS — R0981 Nasal congestion: Secondary | ICD-10-CM | POA: Diagnosis not present

## 2014-06-07 DIAGNOSIS — J449 Chronic obstructive pulmonary disease, unspecified: Secondary | ICD-10-CM | POA: Diagnosis not present

## 2014-06-07 DIAGNOSIS — M1990 Unspecified osteoarthritis, unspecified site: Secondary | ICD-10-CM | POA: Insufficient documentation

## 2014-06-07 DIAGNOSIS — R05 Cough: Secondary | ICD-10-CM | POA: Insufficient documentation

## 2014-06-07 DIAGNOSIS — Z85850 Personal history of malignant neoplasm of thyroid: Secondary | ICD-10-CM | POA: Insufficient documentation

## 2014-06-07 DIAGNOSIS — R110 Nausea: Secondary | ICD-10-CM | POA: Insufficient documentation

## 2014-06-07 LAB — CBC WITH DIFFERENTIAL/PLATELET
BASOS ABS: 0 10*3/uL (ref 0.0–0.1)
BASOS PCT: 1 % (ref 0–1)
Eosinophils Absolute: 0.1 10*3/uL (ref 0.0–0.7)
Eosinophils Relative: 2 % (ref 0–5)
HEMATOCRIT: 34 % — AB (ref 36.0–46.0)
Hemoglobin: 11.1 g/dL — ABNORMAL LOW (ref 12.0–15.0)
LYMPHS PCT: 17 % (ref 12–46)
Lymphs Abs: 1.1 10*3/uL (ref 0.7–4.0)
MCH: 31.4 pg (ref 26.0–34.0)
MCHC: 32.6 g/dL (ref 30.0–36.0)
MCV: 96.3 fL (ref 78.0–100.0)
MONO ABS: 0.3 10*3/uL (ref 0.1–1.0)
Monocytes Relative: 5 % (ref 3–12)
NEUTROS ABS: 4.8 10*3/uL (ref 1.7–7.7)
NEUTROS PCT: 75 % (ref 43–77)
Platelets: 145 10*3/uL — ABNORMAL LOW (ref 150–400)
RBC: 3.53 MIL/uL — ABNORMAL LOW (ref 3.87–5.11)
RDW: 13.2 % (ref 11.5–15.5)
WBC: 6.4 10*3/uL (ref 4.0–10.5)

## 2014-06-07 LAB — COMPREHENSIVE METABOLIC PANEL
ALBUMIN: 3.1 g/dL — AB (ref 3.5–5.2)
ALK PHOS: 89 U/L (ref 39–117)
ALT: 9 U/L (ref 0–35)
AST: 20 U/L (ref 0–37)
Anion gap: 11 (ref 5–15)
BILIRUBIN TOTAL: 0.5 mg/dL (ref 0.3–1.2)
BUN: 22 mg/dL (ref 6–23)
CHLORIDE: 101 meq/L (ref 96–112)
CO2: 27 mEq/L (ref 19–32)
Calcium: 9.4 mg/dL (ref 8.4–10.5)
Creatinine, Ser: 0.93 mg/dL (ref 0.50–1.10)
GFR calc Af Amer: 63 mL/min — ABNORMAL LOW (ref >90)
GFR calc non Af Amer: 54 mL/min — ABNORMAL LOW (ref >90)
Glucose, Bld: 106 mg/dL — ABNORMAL HIGH (ref 70–99)
Potassium: 4.1 mEq/L (ref 3.7–5.3)
SODIUM: 139 meq/L (ref 137–147)
Total Protein: 7.8 g/dL (ref 6.0–8.3)

## 2014-06-07 LAB — URINALYSIS, ROUTINE W REFLEX MICROSCOPIC
Bilirubin Urine: NEGATIVE
GLUCOSE, UA: NEGATIVE mg/dL
Hgb urine dipstick: NEGATIVE
Ketones, ur: NEGATIVE mg/dL
Leukocytes, UA: NEGATIVE
Nitrite: NEGATIVE
PH: 7 (ref 5.0–8.0)
Protein, ur: NEGATIVE mg/dL
Specific Gravity, Urine: 1.014 (ref 1.005–1.030)
Urobilinogen, UA: 0.2 mg/dL (ref 0.0–1.0)

## 2014-06-07 MED ORDER — MECLIZINE HCL 50 MG PO TABS: 25.0000 mg | ORAL_TABLET | Freq: Three times a day (TID) | ORAL | Status: AC | PRN

## 2014-06-07 MED ORDER — SODIUM CHLORIDE 0.9 % IV BOLUS (SEPSIS)
500.0000 mL | INTRAVENOUS | Status: AC
  Administered 2014-06-07: 500 mL via INTRAVENOUS

## 2014-06-07 MED ORDER — ONDANSETRON HCL 4 MG/2ML IJ SOLN
4.0000 mg | Freq: Once | INTRAMUSCULAR | Status: AC
  Administered 2014-06-07: 4 mg via INTRAVENOUS
  Filled 2014-06-07: qty 2

## 2014-06-07 MED ORDER — MECLIZINE HCL 25 MG PO TABS
25.0000 mg | ORAL_TABLET | Freq: Once | ORAL | Status: AC
  Administered 2014-06-07: 25 mg via ORAL
  Filled 2014-06-07: qty 1

## 2014-06-07 NOTE — ED Notes (Signed)
MD at bedside. 

## 2014-06-07 NOTE — ED Notes (Signed)
Patient arrives to ED via [facility] for c/o dizziness and nausea since 0200 this morning

## 2014-06-07 NOTE — ED Notes (Signed)
Patient taken to CT Will medicate upon return to room

## 2014-06-07 NOTE — Discharge Instructions (Signed)

## 2014-06-07 NOTE — ED Notes (Signed)
Patient reports hx of Vertigo and states that this episodes feels the same Patient had Rx for Antivert, but it was 78 years old, so she did not take

## 2014-06-07 NOTE — ED Provider Notes (Signed)
CSN: [id number]     Arrival date & time 06/07/14  1323 History   First MD Initiated Contact with Patient 06/07/14 1451     Chief Complaint  Patient presents with  . Dizziness     (Consider location/radiation/quality/duration/timing/severity/associated sxs/prior Treatment) Patient is a 80 y.o. female presenting with dizziness. The history is provided by the patient.  Dizziness Quality:  Vertigo Severity:  Moderate Onset quality:  Sudden Duration:  12 hours Timing:  Constant Progression:  Unchanged Chronicity:  Recurrent Context: head movement and standing up   Relieved by:  Lying down and being still Worsened by:  Movement, turning head, standing up and sitting upright Ineffective treatments:  None tried Associated symptoms: nausea   Associated symptoms: no chest pain, no diarrhea, no headaches, no shortness of breath and no vomiting     Past Medical History  Diagnosis Date  . Hypertension   . COPD (chronic obstructive pulmonary disease)   . GERD (gastroesophageal reflux disease)   . MVP (mitral valve prolapse)   . Depression   . Allergy   . Osteoporosis   . DJD (degenerative joint disease)   . Hx of colonic polyp 10/02/05  . Heart murmur     had echo 20 yr ago-not sure where  . Cancer     follicular lymphoma   Past Surgical History  Procedure Laterality Date  . Cataract extraction      right eye  . Dilation and curettage of uterus      several  . Ovarian cyst removal    . Appendectomy    . Colonoscopy     History reviewed. No pertinent family history. History  Substance Use Topics  . Smoking status: Never Smoker   . Smokeless tobacco: Not on file  . Alcohol Use: No   OB History    No data available     Review of Systems  Constitutional: Negative for fever and fatigue.  HENT: Positive for congestion. Negative for drooling.   Eyes: Negative for pain.  Respiratory: Positive for cough. Negative for shortness of breath.   Cardiovascular: Negative for  chest pain.  Gastrointestinal: Positive for nausea. Negative for vomiting, abdominal pain and diarrhea.  Genitourinary: Negative for dysuria and hematuria.  Musculoskeletal: Negative for back pain, gait problem and neck pain.  Skin: Negative for color change.  Neurological: Positive for dizziness. Negative for headaches.  Hematological: Negative for adenopathy.  Psychiatric/Behavioral: Negative for behavioral problems.  All other systems reviewed and are negative.     Allergies  Amoxicillin; Aspirin; Benadryl; Ibuprofen; Sulfa antibiotics; Sulindac; and Penicillins  Home Medications   Prior to Admission medications   Medication Sig Start Date End Date Taking? Authorizing Provider  acetaminophen (TYLENOL) 650 MG CR tablet Take 650 mg by mouth every 8 (eight) hours as needed for pain.    Yes Historical Provider, MD  atenolol (TENORMIN) 50 MG tablet Take 50 mg by mouth daily.   Yes Historical Provider, MD  chlordiazePOXIDE (LIBRIUM) 10 MG capsule Take 10 mg by mouth 2 (two) times daily as needed for anxiety.    Yes Historical Provider, MD  dextromethorphan-guaiFENesin (MUCINEX DM) 30-600 MG per 12 hr tablet Take 1 tablet by mouth daily as needed (sinus).    Yes Historical Provider, MD  fluticasone (FLONASE) 50 MCG/ACT nasal spray Place 2 sprays into the nose daily as needed for allergies.    Yes Historical Provider, MD  hydrochlorothiazide (HYDRODIURIL) 25 MG tablet Take 25 mg by mouth daily.   Yes Historical Provider,  MD  HYDROcodone-acetaminophen (NORCO) 5-325 MG per tablet Take 1 tablet by mouth every 6 (six) hours as needed for moderate pain.    Yes Historical Provider, MD  hyoscyamine (LEVSIN SL) 0.125 MG SL tablet Place 0.125 mg under the tongue every 4 (four) hours as needed for cramping.    Yes Historical Provider, MD  loperamide (IMODIUM) 2 MG capsule Take 2 mg by mouth 2 (two) times a week.   Yes Historical Provider, MD  meclizine (ANTIVERT) 25 MG tablet Take 25 mg by mouth 3  (three) times daily as needed for dizziness.    Yes Historical Provider, MD  Multiple Vitamin (MULTIVITAMIN) capsule Take 1 capsule by mouth daily.   Yes Historical Provider, MD  nabumetone (RELAFEN) 750 MG tablet Take 750 mg by mouth 2 (two) times daily.   Yes Historical Provider, MD  Propylene Glycol (SYSTANE BALANCE) 0.6 % SOLN Apply 1-2 drops to eye 3 (three) times daily as needed (dry eyes.).    Yes Historical Provider, MD  ranitidine (ZANTAC) 300 MG tablet Take 300 mg by mouth at bedtime as needed.    Yes Historical Provider, MD  sertraline (ZOLOFT) 50 MG tablet Take 50 mg by mouth daily.   Yes Historical Provider, MD  Calcium Carbonate-Vitamin D (CALCIUM-D) 600-400 MG-UNIT TABS Take by mouth daily.     Historical Provider, MD   BP 169/73 mmHg  Pulse 52  Temp(Src) 98.7 F (37.1 C) (Oral)  Resp 16  SpO2 97% Physical Exam  Constitutional: She is oriented to person, place, and time. She appears well-developed and well-nourished.  HENT:  Head: Normocephalic.  Mouth/Throat: Oropharynx is clear and moist. No oropharyngeal exudate.  Tympanic membranes clear bilaterally.  Eyes: Conjunctivae and EOM are normal. Pupils are equal, round, and reactive to light.  Neck: Normal range of motion. Neck supple.  Cardiovascular: Normal rate, regular rhythm, normal heart sounds and intact distal pulses.  Exam reveals no gallop and no friction rub.   No murmur heard. Pulmonary/Chest: Effort normal and breath sounds normal. No respiratory distress. She has no wheezes.  Abdominal: Soft. Bowel sounds are normal. There is no tenderness. There is no rebound and no guarding.  Musculoskeletal: Normal range of motion. She exhibits no edema or tenderness.  Neurological: She is alert and oriented to person, place, and time.  alert, oriented x3 speech: normal in context and clarity memory: intact grossly cranial nerves II-XII: intact motor strength: full proximally and distally no involuntary movements or  tremors sensation: intact to light touch diffusely  cerebellar: finger-to-nose and heel-to-shin intact Gait: deferred d/t dizziness w/ change in position  Reproduction of dizziness w/ movement of the head.   Skin: Skin is warm and dry.  Psychiatric: She has a normal mood and affect. Her behavior is normal.  Nursing note and vitals reviewed.   ED Course  Procedures (including critical care time) Labs Review Labs Reviewed  CBC WITH DIFFERENTIAL - Abnormal; Notable for the following:    RBC 3.53 (*)    Hemoglobin 11.1 (*)    HCT 34.0 (*)    Platelets 145 (*)    All other components within normal limits  COMPREHENSIVE METABOLIC PANEL - Abnormal; Notable for the following:    Glucose, Bld 106 (*)    Albumin 3.1 (*)    GFR calc non Af Amer 54 (*)    GFR calc Af Amer 63 (*)    All other components within normal limits  URINALYSIS, ROUTINE W REFLEX MICROSCOPIC    Imaging Review  Ct Head Wo Contrast  06/07/2014   CLINICAL DATA:  Recurrent vertigo.  EXAM: CT HEAD WITHOUT CONTRAST  TECHNIQUE: Contiguous axial images were obtained from the base of the skull through the vertex without intravenous contrast.  COMPARISON:  None.  FINDINGS: The brain does not show advanced atrophy considering age. There is no evidence of old or acute focal infarction, mass lesion, hemorrhage, hydrocephalus or extra-axial collection. No calvarial abnormality. No fluid in the middle ears or mastoids. There is chronic appearing inflammation of the right maxillary sinus. This was not completely imaged.  IMPRESSION: Normal appearance of the brain for age. Chronic inflammatory change in the right maxillary sinus, not completely imaged.   Electronically Signed   By: Nelson Chimes M.D.   On: 06/07/2014 16:54     EKG Interpretation   Date/Time:  Tuesday June 07 2014 15:35:32 EST Ventricular Rate:  54 PR Interval:  202 QRS Duration: 85 QT Interval:  456 QTC Calculation: 432 R Axis:   0 Text Interpretation:   Sinus rhythm No significant change since last  tracing Confirmed by Orie Cuttino  MD, Timmie Dugue ([id number]) on 06/07/2014 3:38:38 PM      MDM   Final diagnoses:  Dizziness    3:31 PM 80 y.o. female with a history of follicular lymphoma, mitral valve prolapse, who presents with sudden onset dizziness which began at 2 AM this morning upon awakening. She states that she has had persistent dizziness with head movement and position change since that time. She feels okay while at rest. She has had similar symptoms several times previously. She has a good story for peripheral vertigo given her cough and sinus congestion recently. She denies any fevers, headache, vomiting. She is afebrile and vital signs are unremarkable here.we'll get screening labs and imaging.  5:34 PM: the patient's workup is noncontributory. Her symptoms have improved considerably although she does continue to have some mild dizziness with position changes. She was able to walk forwards and backwards with very mild assistance by me. I do not think this is a stroke. I educated her and her family on the use of meclizine and ongoing dizziness.  I have discussed the diagnosis/risks/treatment options with the patient and family and believe the pt to be eligible for discharge home to follow-up with ENT. We also discussed returning to the ED immediately if new or worsening sx occur. We discussed the sx which are most concerning (e.g., worsening dizziness, weakness, numbness, fever, falls) that necessitate immediate return. Medications administered to the patient during their visit and any new prescriptions provided to the patient are listed below.  Medications given during this visit Medications  ondansetron (ZOFRAN) injection 4 mg (4 mg Intravenous Given 06/07/14 1352)  sodium chloride 0.9 % bolus 500 mL (0 mLs Intravenous Stopped 06/07/14 1657)  meclizine (ANTIVERT) tablet 25 mg (25 mg Oral Given 06/07/14 1657)    New Prescriptions   MECLIZINE  (ANTIVERT) 50 MG TABLET    Take 0.5 tablets (25 mg total) by mouth 3 (three) times daily as needed.     Pamella Pert, MD 06/07/14 [id number]

## 2014-06-07 NOTE — ED Notes (Signed)
Patient with c/o nausea, "especially when I turn my head" Zofran given, see MAR

## 2014-06-07 NOTE — ED Notes (Signed)
Bed: [id number] Expected date:  Expected time:  Means of arrival:  Comments: EMS, dizzy

## 2014-08-22 ENCOUNTER — Telehealth: Payer: Self-pay | Admitting: Hematology and Oncology

## 2014-08-22 NOTE — Telephone Encounter (Signed)
, °

## 2014-09-23 ENCOUNTER — Other Ambulatory Visit: Payer: Self-pay | Admitting: Hematology and Oncology

## 2014-09-23 DIAGNOSIS — C8290 Follicular lymphoma, unspecified, unspecified site: Secondary | ICD-10-CM

## 2014-09-26 ENCOUNTER — Other Ambulatory Visit (HOSPITAL_BASED_OUTPATIENT_CLINIC_OR_DEPARTMENT_OTHER): Payer: Medicare Other

## 2014-09-26 ENCOUNTER — Encounter: Payer: Self-pay | Admitting: Hematology and Oncology

## 2014-09-26 ENCOUNTER — Ambulatory Visit (HOSPITAL_BASED_OUTPATIENT_CLINIC_OR_DEPARTMENT_OTHER): Payer: Medicare Other | Admitting: Hematology and Oncology

## 2014-09-26 ENCOUNTER — Telehealth: Payer: Self-pay | Admitting: Hematology and Oncology

## 2014-09-26 ENCOUNTER — Telehealth: Payer: Self-pay | Admitting: *Deleted

## 2014-09-26 VITALS — BP 150/68 | HR 60 | Temp 98.3°F | Resp 18 | Ht 64.0 in | Wt 116.8 lb

## 2014-09-26 DIAGNOSIS — I10 Essential (primary) hypertension: Secondary | ICD-10-CM

## 2014-09-26 DIAGNOSIS — C8290 Follicular lymphoma, unspecified, unspecified site: Secondary | ICD-10-CM

## 2014-09-26 DIAGNOSIS — R42 Dizziness and giddiness: Secondary | ICD-10-CM | POA: Insufficient documentation

## 2014-09-26 DIAGNOSIS — Z8579 Personal history of other malignant neoplasms of lymphoid, hematopoietic and related tissues: Secondary | ICD-10-CM

## 2014-09-26 LAB — CBC WITH DIFFERENTIAL/PLATELET
BASO%: 1.4 % (ref 0.0–2.0)
BASOS ABS: 0.1 10*3/uL (ref 0.0–0.1)
EOS%: 20.6 % — AB (ref 0.0–7.0)
Eosinophils Absolute: 1 10*3/uL — ABNORMAL HIGH (ref 0.0–0.5)
HCT: 34.9 % (ref 34.8–46.6)
HEMOGLOBIN: 11.2 g/dL — AB (ref 11.6–15.9)
LYMPH#: 1.2 10*3/uL (ref 0.9–3.3)
LYMPH%: 23.2 % (ref 14.0–49.7)
MCH: 30.9 pg (ref 25.1–34.0)
MCHC: 32 g/dL (ref 31.5–36.0)
MCV: 96.6 fL (ref 79.5–101.0)
MONO#: 0.5 10*3/uL (ref 0.1–0.9)
MONO%: 9.4 % (ref 0.0–14.0)
NEUT#: 2.3 10*3/uL (ref 1.5–6.5)
NEUT%: 45.4 % (ref 38.4–76.8)
Platelets: 154 10*3/uL (ref 145–400)
RBC: 3.61 10*6/uL — ABNORMAL LOW (ref 3.70–5.45)
RDW: 13.3 % (ref 11.2–14.5)
WBC: 5.1 10*3/uL (ref 3.9–10.3)

## 2014-09-26 LAB — COMPREHENSIVE METABOLIC PANEL (CC13)
ALK PHOS: 74 U/L (ref 40–150)
ALT: 8 U/L (ref 0–55)
AST: 19 U/L (ref 5–34)
Albumin: 3.4 g/dL — ABNORMAL LOW (ref 3.5–5.0)
Anion Gap: 9 mEq/L (ref 3–11)
BUN: 27.8 mg/dL — ABNORMAL HIGH (ref 7.0–26.0)
CO2: 28 meq/L (ref 22–29)
Calcium: 9.2 mg/dL (ref 8.4–10.4)
Chloride: 105 mEq/L (ref 98–109)
Creatinine: 1.2 mg/dL — ABNORMAL HIGH (ref 0.6–1.1)
EGFR: 43 mL/min/{1.73_m2} — ABNORMAL LOW (ref >90)
Glucose: 82 mg/dl (ref 70–140)
Potassium: 3.9 mEq/L (ref 3.5–5.1)
SODIUM: 142 meq/L (ref 136–145)
TOTAL PROTEIN: 7.2 g/dL (ref 6.4–8.3)
Total Bilirubin: 0.41 mg/dL (ref 0.20–1.20)

## 2014-09-26 LAB — LACTATE DEHYDROGENASE (CC13): LDH: 224 U/L (ref 125–245)

## 2014-09-26 NOTE — Telephone Encounter (Signed)
Informed pt of Dr. Calton Dach message below,  Instructed her to drink plenty of fluids and f/u w/ her PCP.  Pt verbalized understanding.

## 2014-09-26 NOTE — Assessment & Plan Note (Signed)
Clinically, she had no signs of disease recurrence. I will see her on a yearly basis with history, physical examination and blood work only. I would defer imaging study to the future only if she have signs of disease requiring treatment. 

## 2014-09-26 NOTE — Progress Notes (Signed)
[facility] FOLLOW-UP progress notes  Patient Care Team: Gaynelle Arabian, MD as PCP - General (Family Medicine) Heath Lark, MD as Consulting Physician (Hematology and Oncology)  CHIEF COMPLAINTS/PURPOSE OF VISIT:  History lymphoma  HISTORY OF PRESENTING ILLNESS:  [name] 81 y.o. female was transferred to my care after her prior physician has left.  I reviewed the patient's records extensive and collaborated the history with the patient. Summary of her history is as follows: This patient was diagnosed with lymphoma in 2013. CT scan of the abdomen and pelvis on 10/02/2011 that noted shotty bilateral inguinal nodes and a hiatal hernia, otherwise negative. The liver, spleen and pancreas were unremarkable. There was no mesenteric or retroperitoneal adenopathy. She was referred to Dr. Neldon Mc who, on 11/18/2011, performed an excisional biopsy of right inguinal node. Surgical pathology revealed atypical follicular proliferation which was positive for BCl-2 and CD20. The overall impression was that this was clearly involvement of follicular lymphoma. (Case number SZ [id number]). She was initiated on Rituxan treatments, 375 mg/m2=600 mg q wk x 4, From 01/30/2012-02/20/12. PET scan on 12/23/11 had shown axillary, subpectoral and inguinal lymphadenopathy. A PET scan on 02/19/12 did show a complete metabolic response to therapy with only 1 focus of very mild hypermetabolic activity in the right axilla. She is now on close surveillance.   She denies new lymphadenopathy. Denies abnormal weight loss on night sweats. She battles with vertigo regularly since last year. Denies hearing loss. No new neurological deficit. MEDICAL HISTORY:  Past Medical History  Diagnosis Date  . Hypertension   . COPD (chronic obstructive pulmonary disease)   . GERD (gastroesophageal reflux disease)   . MVP (mitral valve prolapse)   . Depression   . Allergy   . Osteoporosis   . DJD (degenerative joint  disease)   . Hx of colonic polyp 10/02/05  . Heart murmur     had echo 20 yr ago-not sure where  . Cancer     follicular lymphoma    SURGICAL HISTORY: Past Surgical History  Procedure Laterality Date  . Cataract extraction      right eye  . Dilation and curettage of uterus      several  . Ovarian cyst removal    . Appendectomy    . Colonoscopy      SOCIAL HISTORY: History   Social History  . Marital Status: Legally Separated    Spouse Name: N/A  . Number of Children: N/A  . Years of Education: N/A   Occupational History  . Not on file.   Social History Main Topics  . Smoking status: Never Smoker   . Smokeless tobacco: Never Used  . Alcohol Use: No  . Drug Use: No  . Sexual Activity: Not on file   Other Topics Concern  . Not on file   Social History Narrative    FAMILY HISTORY: History reviewed. No pertinent family history.  ALLERGIES:  is allergic to amoxicillin; aspirin; benadryl; ibuprofen; sulfa antibiotics; sulindac; and penicillins.  MEDICATIONS:  Current Outpatient Prescriptions  Medication Sig Dispense Refill  . acetaminophen (TYLENOL) 650 MG CR tablet Take 650 mg by mouth every 8 (eight) hours as needed for pain.     Marland Kitchen atenolol (TENORMIN) 50 MG tablet Take 50 mg by mouth daily.    . Calcium Carbonate-Vitamin D (CALCIUM-D) 600-400 MG-UNIT TABS Take by mouth daily.     . chlordiazePOXIDE (LIBRIUM) 10 MG capsule Take 10 mg by mouth 2 (two) times daily as needed  for anxiety.     Marland Kitchen dextromethorphan-guaiFENesin (MUCINEX DM) 30-600 MG per 12 hr tablet Take 1 tablet by mouth daily as needed (sinus).     . fluticasone (FLONASE) 50 MCG/ACT nasal spray Place 2 sprays into the nose daily as needed for allergies.     . hydrochlorothiazide (HYDRODIURIL) 25 MG tablet Take 25 mg by mouth daily.    Marland Kitchen HYDROcodone-acetaminophen (NORCO) 5-325 MG per tablet Take 1 tablet by mouth every 6 (six) hours as needed for moderate pain.     . hyoscyamine (LEVSIN SL) 0.125 MG SL  tablet Place 0.125 mg under the tongue every 4 (four) hours as needed for cramping.     . loperamide (IMODIUM) 2 MG capsule Take 2 mg by mouth 2 (two) times a week.    . meclizine (ANTIVERT) 50 MG tablet Take 0.5 tablets (25 mg total) by mouth 3 (three) times daily as needed. 30 tablet 0  . Multiple Vitamin (MULTIVITAMIN) capsule Take 1 capsule by mouth daily.    . nabumetone (RELAFEN) 750 MG tablet Take 750 mg by mouth 2 (two) times daily.    Marland Kitchen Propylene Glycol (SYSTANE BALANCE) 0.6 % SOLN Apply 1-2 drops to eye 3 (three) times daily as needed (dry eyes.).     Marland Kitchen ranitidine (ZANTAC) 300 MG tablet Take 300 mg by mouth at bedtime as needed.     . sertraline (ZOLOFT) 50 MG tablet Take 50 mg by mouth daily.     No current facility-administered medications for this visit.    REVIEW OF SYSTEMS:   Constitutional: Denies fevers, chills or abnormal night sweats Eyes: Denies blurriness of vision, double vision or watery eyes Ears, nose, mouth, throat, and face: Denies mucositis or sore throat Respiratory: Denies cough, dyspnea or wheezes Cardiovascular: Denies palpitation, chest discomfort or lower extremity swelling Gastrointestinal:  Denies nausea, heartburn or change in bowel habits Skin: Denies abnormal skin rashes Lymphatics: Denies new lymphadenopathy or easy bruising Neurological:Denies numbness, tingling or new weaknesses Behavioral/Psych: Mood is stable, no new changes  All other systems were reviewed with the patient and are negative.  PHYSICAL EXAMINATION: ECOG PERFORMANCE STATUS: 1 - Symptomatic but completely ambulatory  Filed Vitals:   09/26/14 0915  BP: 150/68  Pulse: 60  Temp: 98.3 F (36.8 C)  Resp: 18   Filed Weights   09/26/14 0915  Weight: 116 lb 12.8 oz (52.98 kg)    GENERAL:alert, no distress and comfortable. She is thin SKIN: skin color, texture, turgor are normal, no rashes or significant lesions EYES: normal, conjunctiva are pink and non-injected, sclera  clear OROPHARYNX:no exudate, normal lips, buccal mucosa, and tongue  NECK: supple, thyroid normal size, non-tender, without nodularity LYMPH:  no palpable lymphadenopathy in the cervical, axillary or inguinal LUNGS: clear to auscultation and percussion with normal breathing effort HEART: regular rate & rhythm and no murmurs without lower extremity edema ABDOMEN:abdomen soft, non-tender and normal bowel sounds Musculoskeletal:no cyanosis of digits and no clubbing  PSYCH: alert & oriented x 3 with fluent speech NEURO: no focal motor/sensory deficits  LABORATORY DATA:  I have reviewed the data as listed Lab Results  Component Value Date   WBC 5.1 09/26/2014   HGB 11.2* 09/26/2014   HCT 34.9 09/26/2014   MCV 96.6 09/26/2014   PLT 154 09/26/2014    Recent Labs  03/29/14 0909 06/07/14 1539  NA 141 139  K 3.5 4.1  CL  --  101  CO2 28 27  GLUCOSE 80 106*  BUN 26.2* 22  CREATININE 1.2* 0.93  CALCIUM 9.3 9.4  GFRNONAA  --  54*  GFRAA  --  63*  PROT 7.5 7.8  ALBUMIN 3.5 3.1*  AST 21 20  ALT 8 9  ALKPHOS 68 89  BILITOT 0.43 0.5   ASSESSMENT & PLAN:  Follicular lymphoma Clinically, she had no signs of disease recurrence. I will see her on a yearly basis with history, physical examination and blood work only. I would defer imaging study to the future only if she have signs of disease requiring treatment.   Systolic hypertension Her blood pressure is mildly elevated. She is not symptomatic. She will continue taking her blood pressure medicine.   Intermittent vertigo The cause is unknown, unrelated to lymphoma. CT head in November 2015 were negative. I recommend she contact ENT for further follow-up. In the meantime, she is on Antivert.    Orders Placed This Encounter  Procedures  . CBC with Differential/Platelet    Standing Status: Future     Number of Occurrences:      Standing Expiration Date: 10/31/2015  . Comprehensive metabolic panel    Standing Status:  Future     Number of Occurrences:      Standing Expiration Date: 10/31/2015  . Lactate dehydrogenase    Standing Status: Future     Number of Occurrences:      Standing Expiration Date: 10/31/2015    All questions were answered. The patient knows to call the clinic with any problems, questions or concerns. I spent 15 minutes counseling the patient face to face. The total time spent in the appointment was 20 minutes and more than 50% was on counseling.     Scraper, Economy, MD 09/26/2014 9:41 AM

## 2014-09-26 NOTE — Assessment & Plan Note (Signed)
Her blood pressure is mildly elevated. She is not symptomatic. She will continue taking her blood pressure medicine.

## 2014-09-26 NOTE — Telephone Encounter (Signed)
-----   Message from Heath Lark, MD sent at 09/26/2014 10:00 AM EST ----- Regarding: elevated Cr Pls tell patient to drink more liquids, Cr mildly elevated ----- Message -----    From: [facility] Interface    Sent: 09/26/2014   9:09 AM      To: Heath Lark, MD

## 2014-09-26 NOTE — Telephone Encounter (Signed)
Left Vm for pt to return nurse's call regarding her lab work results.  See Dr. Calton Dach message below.

## 2014-09-26 NOTE — Telephone Encounter (Signed)
gv and printed appt sched and avs for pt for march 2017

## 2014-09-26 NOTE — Assessment & Plan Note (Signed)
The cause is unknown, unrelated to lymphoma. CT head in November 2015 were negative. I recommend she contact ENT for further follow-up. In the meantime, she is on Antivert.

## 2015-03-29 ENCOUNTER — Other Ambulatory Visit: Payer: Self-pay

## 2015-03-29 DIAGNOSIS — Z1231 Encounter for screening mammogram for malignant neoplasm of breast: Secondary | ICD-10-CM

## 2015-04-05 ENCOUNTER — Ambulatory Visit
Admission: RE | Admit: 2015-04-05 | Discharge: 2015-04-05 | Disposition: A | Discharge status: Home / Self Care | Payer: Medicare Other | Source: Ambulatory Visit | Attending: Family Medicine | Admitting: Family Medicine

## 2015-04-05 ENCOUNTER — Other Ambulatory Visit: Payer: Self-pay | Admitting: Family Medicine

## 2015-04-05 DIAGNOSIS — R05 Cough: Secondary | ICD-10-CM

## 2015-04-05 DIAGNOSIS — R059 Cough, unspecified: Secondary | ICD-10-CM

## 2015-05-05 ENCOUNTER — Ambulatory Visit
Admission: RE | Admit: 2015-05-05 | Discharge: 2015-05-05 | Disposition: A | Discharge status: Home / Self Care | Payer: Medicare Other | Source: Ambulatory Visit

## 2015-05-05 DIAGNOSIS — Z1231 Encounter for screening mammogram for malignant neoplasm of breast: Secondary | ICD-10-CM

## 2015-09-25 ENCOUNTER — Ambulatory Visit: Payer: Medicare Other

## 2015-09-25 ENCOUNTER — Encounter: Payer: Self-pay | Admitting: Hematology and Oncology

## 2015-09-25 ENCOUNTER — Telehealth: Payer: Self-pay | Admitting: Hematology

## 2015-09-25 ENCOUNTER — Ambulatory Visit (HOSPITAL_BASED_OUTPATIENT_CLINIC_OR_DEPARTMENT_OTHER): Payer: Medicare Other | Admitting: Hematology and Oncology

## 2015-09-25 ENCOUNTER — Other Ambulatory Visit (HOSPITAL_BASED_OUTPATIENT_CLINIC_OR_DEPARTMENT_OTHER): Payer: Medicare Other

## 2015-09-25 VITALS — BP 174/68 | HR 60 | Temp 98.0°F | Resp 18 | Wt 118.6 lb

## 2015-09-25 DIAGNOSIS — Z8572 Personal history of non-Hodgkin lymphomas: Secondary | ICD-10-CM

## 2015-09-25 DIAGNOSIS — R300 Dysuria: Secondary | ICD-10-CM | POA: Diagnosis not present

## 2015-09-25 DIAGNOSIS — C8290 Follicular lymphoma, unspecified, unspecified site: Secondary | ICD-10-CM

## 2015-09-25 DIAGNOSIS — R350 Frequency of micturition: Secondary | ICD-10-CM | POA: Diagnosis not present

## 2015-09-25 DIAGNOSIS — I10 Essential (primary) hypertension: Secondary | ICD-10-CM

## 2015-09-25 HISTORY — DX: Dysuria: R30.0

## 2015-09-25 LAB — COMPREHENSIVE METABOLIC PANEL
ALBUMIN: 3.5 g/dL (ref 3.5–5.0)
AST: 19 U/L (ref 5–34)
Alkaline Phosphatase: 71 U/L (ref 40–150)
Anion Gap: 7 mEq/L (ref 3–11)
BUN: 29.7 mg/dL — AB (ref 7.0–26.0)
CHLORIDE: 105 meq/L (ref 98–109)
CO2: 27 mEq/L (ref 22–29)
Calcium: 9.2 mg/dL (ref 8.4–10.4)
Creatinine: 1.1 mg/dL (ref 0.6–1.1)
EGFR: 46 mL/min/{1.73_m2} — ABNORMAL LOW (ref >90)
GLUCOSE: 75 mg/dL (ref 70–140)
POTASSIUM: 4.3 meq/L (ref 3.5–5.1)
SODIUM: 139 meq/L (ref 136–145)
Total Bilirubin: 0.63 mg/dL (ref 0.20–1.20)
Total Protein: 7.6 g/dL (ref 6.4–8.3)

## 2015-09-25 LAB — URINALYSIS, MICROSCOPIC - CHCC
Bilirubin (Urine): NEGATIVE
Glucose: NEGATIVE mg/dL
KETONES: NEGATIVE mg/dL
Nitrite: POSITIVE
Protein: 30 mg/dL
Specific Gravity, Urine: 1.02 (ref 1.003–1.035)
Urobilinogen, UR: 0.2 mg/dL (ref 0.2–1)
pH: 6 (ref 4.6–8.0)

## 2015-09-25 LAB — CBC WITH DIFFERENTIAL/PLATELET
BASO%: 0.7 % (ref 0.0–2.0)
BASOS ABS: 0 10*3/uL (ref 0.0–0.1)
EOS%: 13 % — AB (ref 0.0–7.0)
Eosinophils Absolute: 0.9 10*3/uL — ABNORMAL HIGH (ref 0.0–0.5)
HCT: 34.3 % — ABNORMAL LOW (ref 34.8–46.6)
HEMOGLOBIN: 11.2 g/dL — AB (ref 11.6–15.9)
LYMPH%: 21.5 % (ref 14.0–49.7)
MCH: 31.1 pg (ref 25.1–34.0)
MCHC: 32.5 g/dL (ref 31.5–36.0)
MCV: 95.8 fL (ref 79.5–101.0)
MONO#: 0.5 10*3/uL (ref 0.1–0.9)
MONO%: 7.4 % (ref 0.0–14.0)
NEUT#: 3.8 10*3/uL (ref 1.5–6.5)
NEUT%: 57.4 % (ref 38.4–76.8)
Platelets: 131 10*3/uL — ABNORMAL LOW (ref 145–400)
RBC: 3.58 10*6/uL — ABNORMAL LOW (ref 3.70–5.45)
RDW: 13 % (ref 11.2–14.5)
WBC: 6.7 10*3/uL (ref 3.9–10.3)
lymph#: 1.4 10*3/uL (ref 0.9–3.3)

## 2015-09-25 LAB — LACTATE DEHYDROGENASE: LDH: 178 U/L (ref 125–245)

## 2015-09-25 MED ORDER — CIPROFLOXACIN HCL 250 MG PO TABS: 250.0000 mg | ORAL_TABLET | Freq: Two times a day (BID) | ORAL | Status: DC

## 2015-09-25 NOTE — Progress Notes (Signed)
[facility] OFFICE PROGRESS NOTE  Patient Care Team: Gaynelle Arabian, MD as PCP - General (Family Medicine) Heath Lark, MD as Consulting Physician (Hematology and Oncology)  SUMMARY OF ONCOLOGIC HISTORY:  [name] was transferred to my care after her prior physician has left.  I reviewed the patient's records extensive and collaborated the history with the patient. Summary of her history is as follows: This patient was diagnosed with lymphoma in 2013. CT scan of the abdomen and pelvis on 10/02/2011 that noted shotty bilateral inguinal nodes and a hiatal hernia, otherwise negative. The liver, spleen and pancreas were unremarkable. There was no mesenteric or retroperitoneal adenopathy. She was referred to Dr. Neldon Mc who, on 11/18/2011, performed an excisional biopsy of right inguinal node. Surgical pathology revealed atypical follicular proliferation which was positive for BCl-2 and CD20. The overall impression was that this was clearly involvement of follicular lymphoma. (Case number [id number]). She was initiated on Rituxan treatments, 375 mg/m2=600 mg q wk x 4, From 01/30/2012-02/20/12. PET scan on 12/23/11 had shown axillary, subpectoral and inguinal lymphadenopathy. A PET scan on 02/19/12 did show a complete metabolic response to therapy with only 1 focus of very mild hypermetabolic activity in the right axilla. She is now on close surveillance.   INTERVAL HISTORY: Please see below for problem oriented charting. She denies new lymphadenopathy. Denies abnormal weight loss on night sweats. Her only main complaint is dysuria with urinary frequency. She denies flank pain, nausea or vomiting  REVIEW OF SYSTEMS:   Constitutional: Denies fevers, chills or abnormal weight loss Eyes: Denies blurriness of vision Ears, nose, mouth, throat, and face: Denies mucositis or sore throat Respiratory: Denies cough, dyspnea or wheezes Cardiovascular: Denies palpitation, chest  discomfort or lower extremity swelling Gastrointestinal:  Denies nausea, heartburn or change in bowel habits Skin: Denies abnormal skin rashes Lymphatics: Denies new lymphadenopathy or easy bruising Neurological:Denies numbness, tingling or new weaknesses Behavioral/Psych: Mood is stable, no new changes  All other systems were reviewed with the patient and are negative.  I have reviewed the past medical history, past surgical history, social history and family history with the patient and they are unchanged from previous note.  ALLERGIES:  is allergic to amoxicillin; aspirin; benadryl; ibuprofen; sulfa antibiotics; sulindac; and penicillins.  MEDICATIONS:  Current Outpatient Prescriptions  Medication Sig Dispense Refill  . acetaminophen (TYLENOL) 650 MG CR tablet Take 650 mg by mouth every 8 (eight) hours as needed for pain.     Marland Kitchen atenolol (TENORMIN) 50 MG tablet Take 50 mg by mouth daily.    . Calcium Carbonate-Vitamin D (CALCIUM-D) 600-400 MG-UNIT TABS Take by mouth daily.     . chlordiazePOXIDE (LIBRIUM) 10 MG capsule Take 10 mg by mouth 2 (two) times daily as needed for anxiety.     Marland Kitchen dextromethorphan-guaiFENesin (MUCINEX DM) 30-600 MG per 12 hr tablet Take 1 tablet by mouth daily as needed (sinus).     . fluticasone (FLONASE) 50 MCG/ACT nasal spray Place 2 sprays into the nose daily as needed for allergies.     . hydrochlorothiazide (HYDRODIURIL) 25 MG tablet Take 25 mg by mouth daily.    Marland Kitchen HYDROcodone-acetaminophen (NORCO) 5-325 MG per tablet Take 1 tablet by mouth every 6 (six) hours as needed for moderate pain.     Marland Kitchen loperamide (IMODIUM) 2 MG capsule Take 2 mg by mouth 2 (two) times a week.    . meclizine (ANTIVERT) 50 MG tablet Take 0.5 tablets (25 mg total) by mouth 3 (three)  times daily as needed. 30 tablet 0  . Multiple Vitamin (MULTIVITAMIN) capsule Take 1 capsule by mouth daily.    . nabumetone (RELAFEN) 750 MG tablet Take 750 mg by mouth 2 (two) times daily.    Marland Kitchen Propylene  Glycol (SYSTANE BALANCE) 0.6 % SOLN Apply 1-2 drops to eye 3 (three) times daily as needed (dry eyes.).     Marland Kitchen ranitidine (ZANTAC) 300 MG tablet Take 300 mg by mouth at bedtime as needed.     . sertraline (ZOLOFT) 50 MG tablet Take 50 mg by mouth daily.    . ciprofloxacin (CIPRO) 250 MG tablet Take 1 tablet (250 mg total) by mouth 2 (two) times daily. 6 tablet 0  . clindamycin (CLEOCIN) 150 MG capsule Reported on 09/25/2015  0  . hyoscyamine (LEVSIN SL) 0.125 MG SL tablet Place 0.125 mg under the tongue every 4 (four) hours as needed for cramping. Reported on 09/25/2015     No current facility-administered medications for this visit.    PHYSICAL EXAMINATION: ECOG PERFORMANCE STATUS: 1 - Symptomatic but completely ambulatory  Filed Vitals:   09/25/15 0859 09/25/15 0900  BP: 169/146 174/68  Pulse: 55 60  Temp: 98 F (36.7 C)   Resp: 18    Filed Weights   09/25/15 0859  Weight: 118 lb 9.6 oz (53.797 kg)    GENERAL:alert, no distress and comfortable SKIN: skin color, texture, turgor are normal, no rashes or significant lesions EYES: normal, Conjunctiva are pink and non-injected, sclera clear OROPHARYNX:no exudate, no erythema and lips, buccal mucosa, and tongue normal  NECK: supple, thyroid normal size, non-tender, without nodularity LYMPH:  no palpable lymphadenopathy in the cervical, axillary or inguinal LUNGS: clear to auscultation and percussion with normal breathing effort HEART: regular rate & rhythm and no murmurs and no lower extremity edema ABDOMEN:abdomen soft, non-tender and normal bowel sounds Musculoskeletal:no cyanosis of digits and no clubbing  NEURO: alert & oriented x 3 with fluent speech, no focal motor/sensory deficits  LABORATORY DATA:  I have reviewed the data as listed    Component Value Date/Time   NA 139 09/25/2015 0839   NA 139 06/07/2014 1539   K 4.3 09/25/2015 0839   K 4.1 06/07/2014 1539   CL 101 06/07/2014 1539   CL 103 10/09/2012 1036   CO2 27  09/25/2015 0839   CO2 27 06/07/2014 1539   GLUCOSE 75 09/25/2015 0839   GLUCOSE 106* 06/07/2014 1539   GLUCOSE 89 10/09/2012 1036   BUN 29.7* 09/25/2015 0839   BUN 22 06/07/2014 1539   CREATININE 1.1 09/25/2015 0839   CREATININE 0.93 06/07/2014 1539   CALCIUM 9.2 09/25/2015 0839   CALCIUM 9.4 06/07/2014 1539   PROT 7.6 09/25/2015 0839   PROT 7.8 06/07/2014 1539   ALBUMIN 3.5 09/25/2015 0839   ALBUMIN 3.1* 06/07/2014 1539   AST 19 09/25/2015 0839   AST 20 06/07/2014 1539   ALT <9 09/25/2015 0839   ALT 9 06/07/2014 1539   ALKPHOS 71 09/25/2015 0839   ALKPHOS 89 06/07/2014 1539   BILITOT 0.63 09/25/2015 0839   BILITOT 0.5 06/07/2014 1539   GFRNONAA 54* 06/07/2014 1539   GFRAA 63* 06/07/2014 1539    No results found for: SPEP, UPEP  Lab Results  Component Value Date   WBC 6.7 09/25/2015   NEUTROABS 3.8 09/25/2015   HGB 11.2* 09/25/2015   HCT 34.3* 09/25/2015   MCV 95.8 09/25/2015   PLT 131* 09/25/2015      Chemistry  Component Value Date/Time   NA 139 09/25/2015 0839   NA 139 06/07/2014 1539   K 4.3 09/25/2015 0839   K 4.1 06/07/2014 1539   CL 101 06/07/2014 1539   CL 103 10/09/2012 1036   CO2 27 09/25/2015 0839   CO2 27 06/07/2014 1539   BUN 29.7* 09/25/2015 0839   BUN 22 06/07/2014 1539   CREATININE 1.1 09/25/2015 0839   CREATININE 0.93 06/07/2014 1539      Component Value Date/Time   CALCIUM 9.2 09/25/2015 0839   CALCIUM 9.4 06/07/2014 1539   ALKPHOS 71 09/25/2015 0839   ALKPHOS 89 06/07/2014 1539   AST 19 09/25/2015 0839   AST 20 06/07/2014 1539   ALT <9 09/25/2015 0839   ALT 9 06/07/2014 1539   BILITOT 0.63 09/25/2015 0839   BILITOT 0.5 06/07/2014 1539       ASSESSMENT & PLAN:  History of B-cell lymphoma Clinically, she had no signs of disease recurrence. I will see her on a yearly basis with history, physical examination and blood work only. I would defer imaging study to the future only if she have signs of disease requiring  treatment.  Systolic hypertension she will continue current medical management. I recommend close follow-up with primary care doctor for medication adjustment.   Dysuria She has significant symptoms of dysuria. I will order urinalysis and urine culture. I gave her prescription for ciprofloxacin to take and will call her with test results   Orders Placed This Encounter  Procedures  . Urine culture    Standing Status: Future     Number of Occurrences: 1     Standing Expiration Date: 10/29/2016  . Urinalysis, Microscopic - [facility]    Standing Status: Future     Number of Occurrences: 1     Standing Expiration Date: 10/29/2016  . CBC with Differential/Platelet    Standing Status: Future     Number of Occurrences:      Standing Expiration Date: 10/29/2016  . Comprehensive metabolic panel    Standing Status: Future     Number of Occurrences:      Standing Expiration Date: 10/29/2016   All questions were answered. The patient knows to call the clinic with any problems, questions or concerns. No barriers to learning was detected. I spent 15 minutes counseling the patient face to face. The total time spent in the appointment was 20 minutes and more than 50% was on counseling and review of test results     [facility], [place], MD 09/25/2015 10:06 AM

## 2015-09-25 NOTE — Telephone Encounter (Signed)
Gave and printed appt sched and avs for pt for march 2018 °

## 2015-09-25 NOTE — Assessment & Plan Note (Signed)
Clinically, she had no signs of disease recurrence. I will see her on a yearly basis with history, physical examination and blood work only. I would defer imaging study to the future only if she have signs of disease requiring treatment. 

## 2015-09-25 NOTE — Assessment & Plan Note (Signed)
she will continue current medical management. I recommend close follow-up with primary care doctor for medication adjustment.  

## 2015-09-25 NOTE — Assessment & Plan Note (Signed)
She has significant symptoms of dysuria. I will order urinalysis and urine culture. I gave her prescription for ciprofloxacin to take and will call her with test results

## 2015-09-27 ENCOUNTER — Telehealth: Payer: Self-pay | Admitting: *Deleted

## 2015-09-27 LAB — URINE CULTURE

## 2015-09-27 NOTE — Telephone Encounter (Signed)
-----   Message from Heath Lark, MD sent at 09/27/2015  2:07 PM EST ----- Regarding: urine culture PLs let her know urine test came back UTI The antibiotics I gave her should work. PLease advise her to finish her antibiotics ----- Message -----    From: [facility]    Sent: 09/25/2015   8:52 AM      To: Heath Lark, MD

## 2015-09-27 NOTE — Telephone Encounter (Signed)
LVM for pt to return nurse's call regarding test results.  Instructed her to continue to take antibiotic as prescribed by Dr. Alvy Bimler and to please call back to confirm.

## 2015-09-28 ENCOUNTER — Telehealth: Payer: Self-pay | Admitting: *Deleted

## 2015-09-28 NOTE — Telephone Encounter (Signed)
Patient returning call from yesterday. I gave message to patient from 3/8 telephone encounter. She verbalized understanding.

## 2015-10-16 ENCOUNTER — Ambulatory Visit (INDEPENDENT_AMBULATORY_CARE_PROVIDER_SITE_OTHER): Payer: Medicare Other | Admitting: Neurology

## 2015-10-16 ENCOUNTER — Encounter: Payer: Self-pay | Admitting: Neurology

## 2015-10-16 ENCOUNTER — Other Ambulatory Visit: Payer: Medicare Other

## 2015-10-16 VITALS — BP 142/72 | HR 62 | Ht 64.0 in | Wt 117.0 lb

## 2015-10-16 DIAGNOSIS — Z8572 Personal history of non-Hodgkin lymphomas: Secondary | ICD-10-CM | POA: Diagnosis not present

## 2015-10-16 DIAGNOSIS — Z5181 Encounter for therapeutic drug level monitoring: Secondary | ICD-10-CM

## 2015-10-16 DIAGNOSIS — G250 Essential tremor: Secondary | ICD-10-CM

## 2015-10-16 DIAGNOSIS — G609 Hereditary and idiopathic neuropathy, unspecified: Secondary | ICD-10-CM

## 2015-10-16 LAB — TSH: TSH: 1.5 m[IU]/L

## 2015-10-16 LAB — VITAMIN B12: Vitamin B-12: 967 pg/mL (ref 200–1100)

## 2015-10-16 LAB — FOLATE: Folate: 14.2 ng/mL (ref >5.4)

## 2015-10-16 MED ORDER — PRIMIDONE 50 MG PO TABS: 50.0000 mg | ORAL_TABLET | Freq: Every day | ORAL | Status: DC

## 2015-10-16 NOTE — Patient Instructions (Addendum)
1. Your provider has requested that you have labwork completed today. Please go to [facility] ([id number]) on the second floor of this building before leaving the office today. You do not need to check in. If you are not called within 15 minutes please check with the front desk.  2. Start Primidone 50 mg tablets. Take 1/2 tablet for 4 nights, then increase to 1 tablet. Prescription has been sent to your pharmacy. The first dose of medication can cause some dizziness/nausea that should go away after the first dose.

## 2015-10-16 NOTE — Progress Notes (Signed)
Subjective:   [name] was seen in consultation in the movement disorder clinic at the request of Simona Huh, MD.  The evaluation is for tremor.  The patient is a 82 y.o. right handed female with a history of tremor.  Pt reports that she has had tremor for about 10 years and remembers having trouble at work when she was typing and she thought that was the problem.  Tremor has increased over the last few years.  Tremor increases with stress.  The right hand is worse than the L but "it is moving to the L."  States that she feels tremor on the inside and in the legs States that she is having trouble writing because of the tremor, but not because of micrographia.  States that she just wanted a "pill" for the tremor.  There is a family hx of tremor in her mother, her sister and her niece.    Tremor characteristics: Affected by caffeine:  No. (only 1/2 cup coffee in AM) Affected by alcohol: doesn't drink any alcohol Affected by stress:  Yes.   Affected by fatigue:  Yes.   Spills soup if on spoon:  No., but has to hold it steady or hold the wrist Spills glass of liquid if full:  No., but has to hold with both hands Affects ADL's (tying shoes, brushing teeth, etc):  Yes.   (brushing teeth)  Current/Previously tried tremor medications: on atenolol but for BP  Current medications that may exacerbate tremor:  none  Outside reports reviewed: office notes and referral letter/letters.  Allergies  Allergen Reactions  . Amoxicillin Diarrhea and Nausea And Vomiting  . Aspirin Nausea And Vomiting and Other (See Comments)    Heartburn,  Indigestion.  . Benadryl [Diphenhydramine Hcl] Nausea And Vomiting  . Ibuprofen Nausea And Vomiting and Palpitations  . Sulfa Antibiotics Itching and Nausea And Vomiting  . Sulindac     Ineffective for arthritis  . Penicillins Rash    Outpatient Encounter Prescriptions as of 10/16/2015  Medication Sig  . acetaminophen (TYLENOL) 650 MG CR tablet Take  650 mg by mouth every 8 (eight) hours as needed for pain.   Marland Kitchen atenolol (TENORMIN) 50 MG tablet Take 50 mg by mouth daily.  . Calcium Carbonate-Vitamin D (CALCIUM-D) 600-400 MG-UNIT TABS Take by mouth daily.   . chlordiazePOXIDE (LIBRIUM) 10 MG capsule Take 10 mg by mouth 2 (two) times daily as needed for anxiety.   Marland Kitchen dextromethorphan-guaiFENesin (MUCINEX DM) 30-600 MG per 12 hr tablet Take 1 tablet by mouth daily as needed (sinus).   . fluticasone (FLONASE) 50 MCG/ACT nasal spray Place 2 sprays into the nose daily as needed for allergies.   . hydrochlorothiazide (HYDRODIURIL) 25 MG tablet Take 25 mg by mouth daily.  Marland Kitchen HYDROcodone-acetaminophen (NORCO) 5-325 MG per tablet Take 1 tablet by mouth every 6 (six) hours as needed for moderate pain.   . hyoscyamine (LEVSIN SL) 0.125 MG SL tablet Place 0.125 mg under the tongue every 4 (four) hours as needed for cramping. Reported on 09/25/2015  . meclizine (ANTIVERT) 50 MG tablet Take 0.5 tablets (25 mg total) by mouth 3 (three) times daily as needed.  . Multiple Vitamin (MULTIVITAMIN) capsule Take 1 capsule by mouth daily.  . nabumetone (RELAFEN) 750 MG tablet Take 750 mg by mouth 2 (two) times daily.  Marland Kitchen Propylene Glycol (SYSTANE BALANCE) 0.6 % SOLN Apply 1-2 drops to eye 3 (three) times daily as needed (dry eyes.).   Marland Kitchen ranitidine (ZANTAC) 300  MG tablet Take 300 mg by mouth at bedtime as needed.   . sertraline (ZOLOFT) 50 MG tablet Take 50 mg by mouth daily.  . [DISCONTINUED] ciprofloxacin (CIPRO) 250 MG tablet Take 1 tablet (250 mg total) by mouth 2 (two) times daily.  . [DISCONTINUED] clindamycin (CLEOCIN) 150 MG capsule Reported on 09/25/2015  . [DISCONTINUED] loperamide (IMODIUM) 2 MG capsule Take 2 mg by mouth 2 (two) times a week.   No facility-administered encounter medications on file as of 10/16/2015.    Past Medical History  Diagnosis Date  . Hypertension   . COPD (chronic obstructive pulmonary disease) ([place])   . GERD (gastroesophageal reflux  disease)   . MVP (mitral valve prolapse)   . Depression   . Allergy   . Osteoporosis   . DJD (degenerative joint disease)   . Hx of colonic polyp 10/02/05  . Heart murmur     had echo 20 yr ago-not sure where  . Cancer ([place])     follicular lymphoma  . Dysuria 09/25/2015    Past Surgical History  Procedure Laterality Date  . Cataract extraction Bilateral   . Dilation and curettage of uterus      several  . Ovarian cyst removal    . Appendectomy    . Colonoscopy      Social History   Social History  . Marital Status: Legally Separated    Spouse Name: N/A  . Number of Children: N/A  . Years of Education: N/A   Occupational History  . Not on file.   Social History Main Topics  . Smoking status: Never Smoker   . Smokeless tobacco: Never Used  . Alcohol Use: No  . Drug Use: No  . Sexual Activity: Not on file   Other Topics Concern  . Not on file   Social History Narrative    Family Status  Relation Status Death Age  . Mother Deceased 95    strokes  . Father Deceased 28    strokes  . Brother Deceased     prostate cancer  . Sister Deceased     fall/head trauma/stroke  . Sister Deceased     lung cancer  . Daughter Alive     healthy    Review of Systems Some indigestion with certain foods.  No lateralizing weakness/paresthesias.  Has numbness in feet - "I think I have neuropathy."   A complete 10 system ROS was obtained and was negative apart from what is mentioned.   Objective:   VITALS:   Filed Vitals:   10/16/15 0855  BP: 142/72  Pulse: 62  Height: 5\' 4"  (1.626 m)  Weight: 117 lb (53.071 kg)   Gen:  Appears stated age and in NAD. HEENT:  Normocephalic, atraumatic. The mucous membranes are moist. The superficial temporal arteries are without ropiness or tenderness. Cardiovascular: Regular rate and rhythm. Lungs: Clear to auscultation bilaterally. Neck: There are no carotid bruits noted bilaterally.  NEUROLOGICAL:  Orientation:  The patient is  alert and oriented x 3.  Recent and remote memory are intact.  Attention span and concentration are normal.  Able to name objects and repeat without trouble.  Fund of knowledge is appropriate Cranial nerves: There is good facial symmetry. The pupils are equal round and reactive to light bilaterally.  Fundoscopic exam is attempted but the disc margins are not well visualized bilaterally.  Extraocular muscles are intact and visual fields are full to confrontational testing. Speech is fluent and clear. Soft palate rises symmetrically  and there is no tongue deviation. Hearing is intact to conversational tone. Tone: Tone is good throughout. Sensation: Sensation is intact to light touch and pinprick throughout (facial, trunk, extremities). Vibration is decreased vibration distally. There is no extinction with double simultaneous stimulation. There is no sensory dermatomal level identified. Coordination:  The patient has no dysdiadichokinesia or dysmetria. Motor: Strength is 5/5 in the bilateral upper and lower extremities.  Shoulder shrug is equal bilaterally.  There is no pronator drift.  There are no fasciculations noted. DTR's: Deep tendon reflexes are 2/4 at the bilateral biceps, triceps, brachioradialis, patella and 1/4 at the bilateral achilles.  Plantar responses are downgoing bilaterally. Gait and Station: The patient is able to ambulate without difficulty. The patient has trouble with tandem gait  MOVEMENT EXAM: Tremor:  There is  tremor in the UE, noted most significantly with action.  The L is a bit worse than the right.  Mild rest tremor when she holds my computer.  Proximal tremor is more evident than distal tremor.  The patient has mild trouble with archimedes spirals.  The patient is not able to pour water from one glass to another without spilling it.    Labs:    Chemistry      Component Value Date/Time   NA 139 09/25/2015 0839   NA 139 06/07/2014 1539   K 4.3 09/25/2015 0839   K 4.1  06/07/2014 1539   CL 101 06/07/2014 1539   CL 103 10/09/2012 1036   CO2 27 09/25/2015 0839   CO2 27 06/07/2014 1539   BUN 29.7* 09/25/2015 0839   BUN 22 06/07/2014 1539   CREATININE 1.1 09/25/2015 0839   CREATININE 0.93 06/07/2014 1539      Component Value Date/Time   CALCIUM 9.2 09/25/2015 0839   CALCIUM 9.4 06/07/2014 1539   ALKPHOS 71 09/25/2015 0839   ALKPHOS 89 06/07/2014 1539   AST 19 09/25/2015 0839   AST 20 06/07/2014 1539   ALT <9 09/25/2015 0839   ALT 9 06/07/2014 1539   BILITOT 0.63 09/25/2015 0839   BILITOT 0.5 06/07/2014 1539     No results found for: TSH  Lab Results  Component Value Date   WBC 6.7 09/25/2015   HGB 11.2* 09/25/2015   HCT 34.3* 09/25/2015   MCV 95.8 09/25/2015   PLT 131* 09/25/2015      Assessment/Plan:   1.  Essential Tremor.  -This is evidenced by the symmetrical nature and longstanding hx of gradually getting worse and strong family hx.  We discussed nature and pathophysiology.  We discussed that this can continue to gradually get worse with time.  We discussed that some medications can worsen this, as can caffeine use.  We discussed medication therapy as well as surgical therapy.  Ultimately, the patient decided to try primidone, 50 mg.  Risks, benefits, side effects and alternative therapies were discussed.  The opportunity to ask questions was given and they were answered to the best of my ability.  The patient expressed understanding and willingness to follow the outlined treatment protocols.  -check TSH    -reassured her that she does not have PD.  She was very relieved. 2.  Peripheral neuropathy  -Talked about safety.  Will do labwork to r/o reversible causes.  B12, folate, RPR, SPEP/UPEP 3.  Follow up is anticipated in the next few months, sooner should new neurologic issues arise.  Much greater than 50% of this visit was spent in counseling and coordinating care.  Total face to  face time:  60 min

## 2015-10-17 LAB — RPR

## 2015-10-18 LAB — PROTEIN ELECTROPHORESIS, SERUM
ABNORMAL PROTEIN BAND1: 1.6 g/dL
ALPHA-2-GLOBULIN: 0.6 g/dL (ref 0.5–0.9)
Albumin ELP: 4 g/dL (ref 3.8–4.8)
Alpha-1-Globulin: 0.3 g/dL (ref 0.2–0.3)
BETA 2: 0.2 g/dL (ref 0.2–0.5)
BETA GLOBULIN: 0.4 g/dL (ref 0.4–0.6)
GAMMA GLOBULIN: 1.8 g/dL — AB (ref 0.8–1.7)
Total Protein, Serum Electrophoresis: 7.3 g/dL (ref 6.1–8.1)

## 2015-10-18 LAB — PROTEIN ELECTROPHORESIS,RANDOM URN
ALBUMIN UR 24 HR ELECTRO: 58.6 %
ALPHA-1-GLOBULIN, U: 13.1 %
ALPHA-2-GLOBULIN, U: 8.5 %
BETA GLOBULIN, U: 7.7 %
Creatinine, Urine: 102 mg/dL (ref 20–320)
Gamma Globulin, U: 12.1 %
Monoclonal Band 1: 9.7 %
PROTEIN CREATININE RATIO: 422 mg/g{creat} — AB (ref 21–161)
Total Protein, Urine: 43 mg/dL — ABNORMAL HIGH (ref 5–24)

## 2015-10-18 LAB — IMMUNOFIXATION INTE

## 2015-10-18 LAB — IMMUNOFIXATION ELECTROPHORESIS
IGG (IMMUNOGLOBIN G), SERUM: 1950 mg/dL — AB (ref 690–1700)
IGM, SERUM: 18 mg/dL — AB (ref 52–322)
IgA: 47 mg/dL — ABNORMAL LOW (ref 69–380)

## 2015-10-19 ENCOUNTER — Telehealth: Payer: Self-pay | Admitting: Neurology

## 2015-10-19 NOTE — Telephone Encounter (Signed)
-----   Message from [place], DO sent at 10/19/2015  8:37 AM EDT ----- Luvenia Starch, tell pt I want to bring her back for a quick appt to review her labs.

## 2015-10-19 NOTE — Telephone Encounter (Signed)
Follow up appt made with patient.

## 2015-10-24 ENCOUNTER — Encounter: Payer: Self-pay | Admitting: Hematology and Oncology

## 2015-10-24 ENCOUNTER — Telehealth: Payer: Self-pay | Admitting: Neurology

## 2015-10-24 ENCOUNTER — Encounter: Payer: Self-pay | Admitting: Neurology

## 2015-10-24 ENCOUNTER — Other Ambulatory Visit: Payer: Self-pay | Admitting: Hematology and Oncology

## 2015-10-24 ENCOUNTER — Ambulatory Visit (INDEPENDENT_AMBULATORY_CARE_PROVIDER_SITE_OTHER): Payer: Medicare Other | Admitting: Neurology

## 2015-10-24 VITALS — BP 142/82 | HR 56 | Ht 64.0 in | Wt 120.0 lb

## 2015-10-24 DIAGNOSIS — G250 Essential tremor: Secondary | ICD-10-CM

## 2015-10-24 DIAGNOSIS — D472 Monoclonal gammopathy: Secondary | ICD-10-CM

## 2015-10-24 HISTORY — DX: Monoclonal gammopathy: D47.2

## 2015-10-24 NOTE — Telephone Encounter (Signed)
Jade, let pt know that I spoke with Dr. Alvy Bimler and she doesn't need immediate f/u but will want her to f/u sooner than a year.  Dr. Alvy Bimler will get in touch with her to arrange that.

## 2015-10-24 NOTE — Telephone Encounter (Signed)
Patient called back and left voicemail. I tried to call patient multiple times with no answer and no way to leave message.

## 2015-10-24 NOTE — Patient Instructions (Signed)
1. Referral sent to Dr Alvy Bimler at [facility]. They will call you directly with an appt. She is located at [address] [address]. If you do not hear from them they can be reached at [phone].

## 2015-10-24 NOTE — Telephone Encounter (Signed)
Left message on machine for patient to call back.

## 2015-10-24 NOTE — Progress Notes (Signed)
Subjective:   [name] was seen in consultation in the movement disorder clinic at the request of Simona Huh, MD.  The evaluation is for tremor.  The patient is a 82 y.o. right handed female with a history of tremor.  Pt reports that she has had tremor for about 10 years and remembers having trouble at work when she was typing and she thought that was the problem.  Tremor has increased over the last few years.  Tremor increases with stress.  The right hand is worse than the L but "it is moving to the L."  States that she feels tremor on the inside and in the legs States that she is having trouble writing because of the tremor, but not because of micrographia.  States that she just wanted a "pill" for the tremor.  There is a family hx of tremor in her mother, her sister and her niece.    Tremor characteristics: Affected by caffeine:  No. (only 1/2 cup coffee in AM) Affected by alcohol: doesn't drink any alcohol Affected by stress:  Yes.   Affected by fatigue:  Yes.   Spills soup if on spoon:  No., but has to hold it steady or hold the wrist Spills glass of liquid if full:  No., but has to hold with both hands Affects ADL's (tying shoes, brushing teeth, etc):  Yes.   (brushing teeth)  10/24/15:  The patient follows up today, just to go over laboratory results.  I started her on primidone last visit for essential tremor, and she has only been on that for a few days, but she states that she is much better.  She has been a little dizzy with it. "I can write and I don't have the shakes like I did."  She has a little nausea.   I did a lab workup for reversible causes of peripheral neuropathy and found that she had evidence of IgG kappa light chains on her urine immunoelectrophoresis.  Her serum electrophoresis also revealed monoclonal protein.  Current/Previously tried tremor medications: on atenolol but for BP  Current medications that may exacerbate tremor:  none  Outside reports  reviewed: office notes and referral letter/letters.  Allergies  Allergen Reactions  . Amoxicillin Diarrhea and Nausea And Vomiting  . Aspirin Nausea And Vomiting and Other (See Comments)    Heartburn,  Indigestion.  . Benadryl [Diphenhydramine Hcl] Nausea And Vomiting  . Ibuprofen Nausea And Vomiting and Palpitations  . Sulfa Antibiotics Itching and Nausea And Vomiting  . Sulindac     Ineffective for arthritis  . Penicillins Rash    Outpatient Encounter Prescriptions as of 10/24/2015  Medication Sig  . acetaminophen (TYLENOL) 650 MG CR tablet Take 650 mg by mouth every 8 (eight) hours as needed for pain.   Marland Kitchen atenolol (TENORMIN) 50 MG tablet Take 50 mg by mouth daily.  . Calcium Carbonate-Vitamin D (CALCIUM-D) 600-400 MG-UNIT TABS Take by mouth daily.   . chlordiazePOXIDE (LIBRIUM) 10 MG capsule Take 10 mg by mouth 2 (two) times daily as needed for anxiety.   Marland Kitchen dextromethorphan-guaiFENesin (MUCINEX DM) 30-600 MG per 12 hr tablet Take 1 tablet by mouth daily as needed (sinus).   . fluticasone (FLONASE) 50 MCG/ACT nasal spray Place 2 sprays into the nose daily as needed for allergies.   . hydrochlorothiazide (HYDRODIURIL) 25 MG tablet Take 25 mg by mouth daily.  Marland Kitchen HYDROcodone-acetaminophen (NORCO) 5-325 MG per tablet Take 1 tablet by mouth every 6 (six) hours as needed  for moderate pain.   . hyoscyamine (LEVSIN SL) 0.125 MG SL tablet Place 0.125 mg under the tongue every 4 (four) hours as needed for cramping. Reported on 09/25/2015  . meclizine (ANTIVERT) 50 MG tablet Take 0.5 tablets (25 mg total) by mouth 3 (three) times daily as needed.  . Multiple Vitamin (MULTIVITAMIN) capsule Take 1 capsule by mouth daily.  . nabumetone (RELAFEN) 750 MG tablet Take 750 mg by mouth 2 (two) times daily.  . primidone (MYSOLINE) 50 MG tablet Take 1 tablet (50 mg total) by mouth at bedtime.  Marland Kitchen Propylene Glycol (SYSTANE BALANCE) 0.6 % SOLN Apply 1-2 drops to eye 3 (three) times daily as needed (dry eyes.).     Marland Kitchen ranitidine (ZANTAC) 300 MG tablet Take 300 mg by mouth at bedtime as needed.   . sertraline (ZOLOFT) 50 MG tablet Take 50 mg by mouth daily.   No facility-administered encounter medications on file as of 10/24/2015.    Past Medical History  Diagnosis Date  . Hypertension   . COPD (chronic obstructive pulmonary disease) ([place])   . GERD (gastroesophageal reflux disease)   . MVP (mitral valve prolapse)   . Depression   . Allergy   . Osteoporosis   . DJD (degenerative joint disease)   . Hx of colonic polyp 10/02/05  . Heart murmur     had echo 20 yr ago-not sure where  . Cancer ([place])     follicular lymphoma  . Dysuria 09/25/2015    Past Surgical History  Procedure Laterality Date  . Cataract extraction Bilateral   . Dilation and curettage of uterus      several  . Ovarian cyst removal    . Appendectomy    . Colonoscopy      Social History   Social History  . Marital Status: Legally Separated    Spouse Name: N/A  . Number of Children: N/A  . Years of Education: N/A   Occupational History  . Not on file.   Social History Main Topics  . Smoking status: Never Smoker   . Smokeless tobacco: Never Used  . Alcohol Use: No  . Drug Use: No  . Sexual Activity: Not on file   Other Topics Concern  . Not on file   Social History Narrative    Family Status  Relation Status Death Age  . Mother Deceased 95    strokes, tremor  . Father Deceased 58    strokes  . Brother Deceased     prostate cancer  . Sister Deceased     fall/head trauma/stroke  . Sister Deceased     lung cancer  . Daughter Alive     healthy, thyroid disease    Review of Systems Some indigestion with certain foods.  A complete 10 system ROS was obtained and was negative apart from what is mentioned.   Objective:   VITALS:   Filed Vitals:   10/24/15 0906  BP: 142/82  Pulse: 56  Height: 5\' 4"  (1.626 m)  Weight: 120 lb (54.432 kg)   Gen:  Appears stated age and in NAD. HEENT:   Normocephalic, atraumatic. The mucous membranes are moist. The superficial temporal arteries are without ropiness or tenderness. Cardiovascular: Regular rate and rhythm. Lungs: Clear to auscultation bilaterally. Neck: There are no carotid bruits noted bilaterally.  NEUROLOGICAL:  Orientation:  The patient is alert and oriented x 3.   Cranial nerves: There is good facial symmetry. Speech is fluent and clear. Soft palate rises symmetrically and  there is no tongue deviation. Hearing is intact to conversational tone. Tone: Tone is good throughout. Sensation: Sensation is intact to light touch throughout Coordination:  The patient has no dysdiadichokinesia or dysmetria. Motor: Strength is at least antigravity x 4.   Gait and Station: The patient is able to ambulate without difficulty.      Labs:    Chemistry      Component Value Date/Time   NA 139 09/25/2015 0839   NA 139 06/07/2014 1539   K 4.3 09/25/2015 0839   K 4.1 06/07/2014 1539   CL 101 06/07/2014 1539   CL 103 10/09/2012 1036   CO2 27 09/25/2015 0839   CO2 27 06/07/2014 1539   BUN 29.7* 09/25/2015 0839   BUN 22 06/07/2014 1539   CREATININE 1.1 09/25/2015 0839   CREATININE 0.93 06/07/2014 1539      Component Value Date/Time   CALCIUM 9.2 09/25/2015 0839   CALCIUM 9.4 06/07/2014 1539   ALKPHOS 71 09/25/2015 0839   ALKPHOS 89 06/07/2014 1539   AST 19 09/25/2015 0839   AST 20 06/07/2014 1539   ALT <9 09/25/2015 0839   ALT 9 06/07/2014 1539   BILITOT 0.63 09/25/2015 0839   BILITOT 0.5 06/07/2014 1539     Lab Results  Component Value Date   TSH 1.50 10/16/2015    Lab Results  Component Value Date   WBC 6.7 09/25/2015   HGB 11.2* 09/25/2015   HCT 34.3* 09/25/2015   MCV 95.8 09/25/2015   PLT 131* 09/25/2015      Assessment/Plan:   1.  Essential Tremor.  -This is evidenced by the symmetrical nature and longstanding hx of gradually getting worse and strong family hx.  Just started primidone and thinks that  it has already been helpful. 2.  Peripheral neuropathy  -lab w/u demonstrated kappa light chains.  Does have h/o follicular lymphoma 3.  MGUS  -Long discussion with the patient today.  I explained to her that she had evidence of IgG kappa light chains on urine IFE and serum IFE also demonstrated a monoclonal proteins.  She already sees Dr. Alvy Bimler for follicular lymphoma.  Told her I am unaware if she would need further w/u and will talk with Dr. Alvy Bimler.  Following the visit, I did call Dr. Alvy Bimler and she said pt didn't need immediate f/u but probably needed a sooner than 1 year f/u and she would make sure that happened.  Appreciate the kind input of Dr. Alvy Bimler.

## 2015-10-25 ENCOUNTER — Other Ambulatory Visit: Payer: Self-pay | Admitting: Hematology and Oncology

## 2015-10-25 ENCOUNTER — Telehealth: Payer: Self-pay | Admitting: Hematology and Oncology

## 2015-10-25 NOTE — Telephone Encounter (Signed)
Spoke with patient - she was made aware and spoke with Dr Alvy Bimler yesterday.

## 2015-10-25 NOTE — Telephone Encounter (Signed)
I received telephone call from the neurologist informing me that the patient now has abnormal M spike, IgG subtype. I do not believe this is related to her prior diagnosis of lymphoma but certainly can cause pancytopenia. I recommend seeing her back in 6 months instead of one year as previously discussed. She agreed with the plan of care. I have placed in orders to see her back in October with blood draw to be done a week ahead of time.

## 2015-10-31 ENCOUNTER — Telehealth: Payer: Self-pay | Admitting: Hematology and Oncology

## 2015-10-31 NOTE — Telephone Encounter (Signed)
returned call and lvm confirming OCT appt.Marland KitchenMarland KitchenMarland Kitchen

## 2015-11-13 ENCOUNTER — Telehealth: Payer: Self-pay | Admitting: Neurology

## 2015-11-13 NOTE — Telephone Encounter (Signed)
Spoke with patient - explained about authorization dates to get refills. She will call with any other questions.

## 2015-11-13 NOTE — Telephone Encounter (Signed)
[name] Jul 03, 2026. She would like to speak with you about refills on her medication. She does not think she has enough to last until her refill date. She uses [employer] [facility] [facility] on [address]. Thank you

## 2015-11-15 ENCOUNTER — Telehealth: Payer: Self-pay | Admitting: Neurology

## 2015-11-15 NOTE — Telephone Encounter (Signed)
Why doesn't she just d/c it and see how that goes and also f/u with PCP.  We can restart in future if PCP thinks that it is unrelated but should f/u with PCP this week

## 2015-11-15 NOTE — Telephone Encounter (Signed)
Patient made aware.

## 2015-11-15 NOTE — Telephone Encounter (Signed)
Called and spoke with patient. She states about 1 1/2- 2 weeks after starting Primidone she developed a rash on her waist line. She was scared this was shingles because she has had it in the past. She started using shingles lotion and it wasn't getting any better. Has now traveled up her stomach and her arms. This isn't painful, but very itchy. She has had no other new medications. She hasn't changed any soaps or detergents because she has a history of sensitive skin. Please advise.

## 2015-11-15 NOTE — Telephone Encounter (Signed)
[name] 27-Oct-2025 called to let you know that she thinks she has had an allergic reaction to the Primidone she was put on and she feels that it is spreading.  She was wondering if maybe she could be put on Prednisone to calm it down. Her number is [phone]. Thank you

## 2016-01-16 ENCOUNTER — Ambulatory Visit (INDEPENDENT_AMBULATORY_CARE_PROVIDER_SITE_OTHER): Payer: Medicare Other | Admitting: Neurology

## 2016-01-16 ENCOUNTER — Encounter: Payer: Self-pay | Admitting: Neurology

## 2016-01-16 VITALS — BP 130/84 | HR 57 | Ht 64.0 in | Wt 115.5 lb

## 2016-01-16 DIAGNOSIS — D472 Monoclonal gammopathy: Secondary | ICD-10-CM | POA: Diagnosis not present

## 2016-01-16 DIAGNOSIS — G250 Essential tremor: Secondary | ICD-10-CM

## 2016-01-16 MED ORDER — PRIMIDONE 50 MG PO TABS: 50.0000 mg | ORAL_TABLET | Freq: Two times a day (BID) | ORAL | Status: DC

## 2016-01-16 NOTE — Progress Notes (Signed)
Subjective:   [name] was seen in consultation in the movement disorder clinic at the request of Simona Huh, MD.  The evaluation is for tremor.  The patient is a 82 y.o. right handed female with a history of tremor.  Pt reports that she has had tremor for about 10 years and remembers having trouble at work when she was typing and she thought that was the problem.  Tremor has increased over the last few years.  Tremor increases with stress.  The right hand is worse than the L but "it is moving to the L."  States that she feels tremor on the inside and in the legs States that she is having trouble writing because of the tremor, but not because of micrographia.  States that she just wanted a "pill" for the tremor.  There is a family hx of tremor in her mother, her sister and her niece.    Tremor characteristics: Affected by caffeine:  No. (only 1/2 cup coffee in AM) Affected by alcohol: doesn't drink any alcohol Affected by stress:  Yes.   Affected by fatigue:  Yes.   Spills soup if on spoon:  No., but has to hold it steady or hold the wrist Spills glass of liquid if full:  No., but has to hold with both hands Affects ADL's (tying shoes, brushing teeth, etc):  Yes.   (brushing teeth)  10/24/15:  The patient follows up today, just to go over laboratory results.  I started her on primidone last visit for essential tremor, and she has only been on that for a few days, but she states that she is much better.  She has been a little dizzy with it. "I can write and I don't have the shakes like I did."  She has a little nausea.   I did a lab workup for reversible causes of peripheral neuropathy and found that she had evidence of IgG kappa light chains on her urine immunoelectrophoresis.  Her serum electrophoresis also revealed monoclonal protein.  01/16/16 update:  The patient is seen today in follow-up.  I have reviewed records since our last visit.  She is on primidone, 50 mg for essential  tremor.  She thinks that it is usually very helpful and occasionally she will note trouble writing.  She called me at the end of April and stated that she had developed a rash at her waistline and she thought it was from the primidone.  I had doubted that and asked her to follow-up with her primary care physician, which she did.  I got a note from her primary care physician that she was seen on April 27 and told that the rash was from shingles and not from primidone and that her symptoms were mild.  She was started on zara lotion.  She is doing better from that regard.  Last visit, I talked her about the fact that she had an M spike and ended up talking to Dr. Alvy Bimler, who she sees for follicular lymphoma.  Dr. Alvy Bimler thought that this was unrelated to the lymphoma, but said that she would like the patient follow-up in 6 months instead of one year.  Current/Previously tried tremor medications: on atenolol but for BP  Current medications that may exacerbate tremor:  none  Outside reports reviewed: office notes and referral letter/letters.  Allergies  Allergen Reactions  . Amoxicillin Diarrhea and Nausea And Vomiting  . Aspirin Nausea And Vomiting and Other (See Comments)  Heartburn,  Indigestion.  . Benadryl [Diphenhydramine Hcl] Nausea And Vomiting  . Ibuprofen Nausea And Vomiting and Palpitations  . Sulfa Antibiotics Itching and Nausea And Vomiting  . Sulindac     Ineffective for arthritis  . Penicillins Rash    Outpatient Encounter Prescriptions as of 01/16/2016  Medication Sig  . acetaminophen (TYLENOL) 650 MG CR tablet Take 650 mg by mouth every 8 (eight) hours as needed for pain.   Marland Kitchen atenolol (TENORMIN) 50 MG tablet Take 50 mg by mouth daily.  . Calcium Carbonate-Vitamin D (CALCIUM-D) 600-400 MG-UNIT TABS Take by mouth daily.   . chlordiazePOXIDE (LIBRIUM) 10 MG capsule Take 10 mg by mouth 2 (two) times daily as needed for anxiety.   Marland Kitchen dextromethorphan-guaiFENesin (MUCINEX DM)  30-600 MG per 12 hr tablet Take 1 tablet by mouth daily as needed (sinus).   . fluticasone (FLONASE) 50 MCG/ACT nasal spray Place 2 sprays into the nose daily as needed for allergies.   . hydrochlorothiazide (HYDRODIURIL) 25 MG tablet Take 25 mg by mouth daily.  Marland Kitchen HYDROcodone-acetaminophen (NORCO) 5-325 MG per tablet Take 1 tablet by mouth every 6 (six) hours as needed for moderate pain.   . hyoscyamine (LEVSIN SL) 0.125 MG SL tablet Place 0.125 mg under the tongue every 4 (four) hours as needed for cramping. Reported on 09/25/2015  . meclizine (ANTIVERT) 50 MG tablet Take 0.5 tablets (25 mg total) by mouth 3 (three) times daily as needed.  . Multiple Vitamin (MULTIVITAMIN) capsule Take 1 capsule by mouth daily.  . nabumetone (RELAFEN) 750 MG tablet Take 750 mg by mouth 2 (two) times daily.  . primidone (MYSOLINE) 50 MG tablet Take 1 tablet (50 mg total) by mouth at bedtime.  Marland Kitchen Propylene Glycol (SYSTANE BALANCE) 0.6 % SOLN Apply 1-2 drops to eye 3 (three) times daily as needed (dry eyes.).   Marland Kitchen ranitidine (ZANTAC) 300 MG tablet Take 300 mg by mouth at bedtime as needed.   . sertraline (ZOLOFT) 50 MG tablet Take 50 mg by mouth daily.   No facility-administered encounter medications on file as of 01/16/2016.    Past Medical History  Diagnosis Date  . Hypertension   . COPD (chronic obstructive pulmonary disease) ([place])   . GERD (gastroesophageal reflux disease)   . MVP (mitral valve prolapse)   . Depression   . Allergy   . Osteoporosis   . DJD (degenerative joint disease)   . Hx of colonic polyp 10/02/05  . Heart murmur     had echo 20 yr ago-not sure where  . Cancer ([place])     follicular lymphoma  . Dysuria 09/25/2015  . MGUS (monoclonal gammopathy of unknown significance) 10/24/2015    Past Surgical History  Procedure Laterality Date  . Cataract extraction Bilateral   . Dilation and curettage of uterus      several  . Ovarian cyst removal    . Appendectomy    . Colonoscopy       Social History   Social History  . Marital Status: Legally Separated    Spouse Name: N/A  . Number of Children: N/A  . Years of Education: N/A   Occupational History  . Not on file.   Social History Main Topics  . Smoking status: Never Smoker   . Smokeless tobacco: Never Used  . Alcohol Use: No  . Drug Use: No  . Sexual Activity: Not on file   Other Topics Concern  . Not on file   Social History Narrative  Family Status  Relation Status Death Age  . Mother Deceased 95    strokes, tremor  . Father Deceased 3    strokes  . Brother Deceased     prostate cancer  . Sister Deceased     fall/head trauma/stroke  . Sister Deceased     lung cancer  . Daughter Alive     healthy, thyroid disease    Review of Systems Some indigestion with certain foods.  A complete 10 system ROS was obtained and was negative apart from what is mentioned.   Objective:   VITALS:   Filed Vitals:   01/16/16 0858  BP: 130/84  Pulse: 57  Height: 5\' 4"  (1.626 m)  Weight: 115 lb 8 oz (52.39 kg)  SpO2: 98%   Gen:  Appears stated age and in NAD. HEENT:  Normocephalic, atraumatic. The mucous membranes are moist. The superficial temporal arteries are without ropiness or tenderness. Cardiovascular: Regular rate and rhythm. Lungs: Clear to auscultation bilaterally. Neck: There are no carotid bruits noted bilaterally.  NEUROLOGICAL:  Orientation:  The patient is alert and oriented x 3.   Cranial nerves: There is good facial symmetry. Speech is fluent and clear. Soft palate rises symmetrically and there is no tongue deviation. Hearing is intact to conversational tone. Tone: Tone is good throughout. Sensation: Sensation is intact to light touch throughout Coordination:  The patient has no dysdiadichokinesia or dysmetria. Motor: Strength is at least antigravity x 4.   Gait and Station: The patient is able to ambulate without difficulty.      Labs:    Chemistry      Component  Value Date/Time   NA 139 09/25/2015 0839   NA 139 06/07/2014 1539   K 4.3 09/25/2015 0839   K 4.1 06/07/2014 1539   CL 101 06/07/2014 1539   CL 103 10/09/2012 1036   CO2 27 09/25/2015 0839   CO2 27 06/07/2014 1539   BUN 29.7* 09/25/2015 0839   BUN 22 06/07/2014 1539   CREATININE 1.1 09/25/2015 0839   CREATININE 0.93 06/07/2014 1539      Component Value Date/Time   CALCIUM 9.2 09/25/2015 0839   CALCIUM 9.4 06/07/2014 1539   ALKPHOS 71 09/25/2015 0839   ALKPHOS 89 06/07/2014 1539   AST 19 09/25/2015 0839   AST 20 06/07/2014 1539   ALT <9 09/25/2015 0839   ALT 9 06/07/2014 1539   BILITOT 0.63 09/25/2015 0839   BILITOT 0.5 06/07/2014 1539     Lab Results  Component Value Date   TSH 1.50 10/16/2015    Lab Results  Component Value Date   WBC 6.7 09/25/2015   HGB 11.2* 09/25/2015   HCT 34.3* 09/25/2015   MCV 95.8 09/25/2015   PLT 131* 09/25/2015      Assessment/Plan:   1.  Essential Tremor.  -This is evidenced by the symmetrical nature and longstanding hx of gradually getting worse and strong family hx.    -increase primidone to 50 mg - 1/2 to 1 tablet in the AM and 1 tablet at night. 2.  Peripheral neuropathy  -lab w/u demonstrated kappa light chains.  Does have h/o follicular lymphoma 3.  MGUS  -Dr. Alvy Bimler doesn't think related to follicular lymphoma and following her closer and next f/u in october

## 2016-01-16 NOTE — Patient Instructions (Addendum)
1. Increase Primidone to one tablet in the morning and one tablet at night. Refill sent to [facility]. If the increase makes you feel groggy you can cut the morning tablet in half.

## 2016-04-08 ENCOUNTER — Other Ambulatory Visit: Payer: Self-pay | Admitting: Family Medicine

## 2016-04-08 DIAGNOSIS — Z1231 Encounter for screening mammogram for malignant neoplasm of breast: Secondary | ICD-10-CM

## 2016-04-25 ENCOUNTER — Other Ambulatory Visit (HOSPITAL_BASED_OUTPATIENT_CLINIC_OR_DEPARTMENT_OTHER): Payer: Medicare Other

## 2016-04-25 DIAGNOSIS — D472 Monoclonal gammopathy: Secondary | ICD-10-CM

## 2016-04-25 DIAGNOSIS — Z8572 Personal history of non-Hodgkin lymphomas: Secondary | ICD-10-CM | POA: Diagnosis not present

## 2016-04-25 LAB — COMPREHENSIVE METABOLIC PANEL
ALK PHOS: 96 U/L (ref 40–150)
ANION GAP: 7 meq/L (ref 3–11)
AST: 59 U/L — ABNORMAL HIGH (ref 5–34)
Albumin: 3.3 g/dL — ABNORMAL LOW (ref 3.5–5.0)
BILIRUBIN TOTAL: 0.33 mg/dL (ref 0.20–1.20)
BUN: 22.8 mg/dL (ref 7.0–26.0)
CALCIUM: 9.1 mg/dL (ref 8.4–10.4)
CHLORIDE: 104 meq/L (ref 98–109)
CO2: 27 mEq/L (ref 22–29)
CREATININE: 1 mg/dL (ref 0.6–1.1)
EGFR: 51 mL/min/{1.73_m2} — ABNORMAL LOW (ref >90)
Glucose: 72 mg/dl (ref 70–140)
Potassium: 4.2 mEq/L (ref 3.5–5.1)
Sodium: 139 mEq/L (ref 136–145)
TOTAL PROTEIN: 7.7 g/dL (ref 6.4–8.3)

## 2016-04-25 LAB — CBC WITH DIFFERENTIAL/PLATELET
BASO%: 3.1 % — AB (ref 0.0–2.0)
BASOS ABS: 0.3 10*3/uL — AB (ref 0.0–0.1)
EOS%: 8.6 % — ABNORMAL HIGH (ref 0.0–7.0)
Eosinophils Absolute: 0.8 10*3/uL — ABNORMAL HIGH (ref 0.0–0.5)
HEMATOCRIT: 32.6 % — AB (ref 34.8–46.6)
HGB: 10.7 g/dL — ABNORMAL LOW (ref 11.6–15.9)
LYMPH#: 4 10*3/uL — AB (ref 0.9–3.3)
LYMPH%: 44.2 % (ref 14.0–49.7)
MCH: 31.7 pg (ref 25.1–34.0)
MCHC: 32.8 g/dL (ref 31.5–36.0)
MCV: 96.4 fL (ref 79.5–101.0)
MONO#: 0.5 10*3/uL (ref 0.1–0.9)
MONO%: 5.4 % (ref 0.0–14.0)
NEUT#: 3.5 10*3/uL (ref 1.5–6.5)
NEUT%: 38.7 % (ref 38.4–76.8)
PLATELETS: 270 10*3/uL (ref 145–400)
RBC: 3.38 10*6/uL — ABNORMAL LOW (ref 3.70–5.45)
RDW: 14.3 % (ref 11.2–14.5)
WBC: 8.9 10*3/uL (ref 3.9–10.3)

## 2016-04-26 LAB — KAPPA/LAMBDA LIGHT CHAINS
IG KAPPA FREE LIGHT CHAIN: 138.5 mg/L — AB (ref 3.3–19.4)
IG LAMBDA FREE LIGHT CHAIN: 18.6 mg/L (ref 5.7–26.3)
KAPPA/LAMBDA FLC RATIO: 7.45 — AB (ref 0.26–1.65)

## 2016-04-30 LAB — MULTIPLE MYELOMA PANEL, SERUM
ALPHA2 GLOB SERPL ELPH-MCNC: 0.7 g/dL (ref 0.4–1.0)
Albumin SerPl Elph-Mcnc: 3.6 g/dL (ref 2.9–4.4)
Albumin/Glob SerPl: 1.1 (ref 0.7–1.7)
Alpha 1: 0.3 g/dL (ref 0.0–0.4)
B-GLOBULIN SERPL ELPH-MCNC: 0.8 g/dL (ref 0.7–1.3)
GLOBULIN, TOTAL: 3.6 g/dL (ref 2.2–3.9)
Gamma Glob SerPl Elph-Mcnc: 1.9 g/dL — ABNORMAL HIGH (ref 0.4–1.8)
IgA, Qn, Serum: 57 mg/dL — ABNORMAL LOW (ref 64–422)
IgM, Qn, Serum: 27 mg/dL (ref 26–217)
M PROTEIN SERPL ELPH-MCNC: 1.4 g/dL — AB
TOTAL PROTEIN: 7.2 g/dL (ref 6.0–8.5)

## 2016-05-02 ENCOUNTER — Ambulatory Visit (HOSPITAL_BASED_OUTPATIENT_CLINIC_OR_DEPARTMENT_OTHER): Payer: Medicare Other | Admitting: Hematology and Oncology

## 2016-05-02 ENCOUNTER — Telehealth: Payer: Self-pay | Admitting: Hematology and Oncology

## 2016-05-02 ENCOUNTER — Encounter: Payer: Self-pay | Admitting: Hematology and Oncology

## 2016-05-02 DIAGNOSIS — Z8572 Personal history of non-Hodgkin lymphomas: Secondary | ICD-10-CM

## 2016-05-02 DIAGNOSIS — D472 Monoclonal gammopathy: Secondary | ICD-10-CM

## 2016-05-02 DIAGNOSIS — L509 Urticaria, unspecified: Secondary | ICD-10-CM | POA: Diagnosis not present

## 2016-05-02 MED ORDER — PREDNISONE 10 MG PO TABS: 10.0000 mg | ORAL_TABLET | Freq: Every day | ORAL | 0 refills | Status: DC

## 2016-05-02 NOTE — Assessment & Plan Note (Signed)
She has very mild disease progression from MGUS standpoint but overall no life-threatening organ damage that would warrant the start of treatment. I recommend close observation and repeat myeloma panel in 6 months

## 2016-05-02 NOTE — Assessment & Plan Note (Signed)
She is very distressed with recurrent diffuse hives and allergy-like symptoms. She had very mild eosinophilia. I recommend a trial of low-dose prednisone therapy at 10 mg daily and plan to reassess in 10 days. The risk, benefit, side effects of prednisone is discussed with the patient and she agreed to proceed

## 2016-05-02 NOTE — Progress Notes (Signed)
[place] OFFICE PROGRESS NOTE  Patient Care Team: Gaynelle Arabian, MD as PCP - General (Family Medicine) Heath Lark, MD as Consulting Physician (Hematology and Oncology)  SUMMARY OF ONCOLOGIC HISTORY:  [name] was transferred to my care after her prior physician has left.  I reviewed the patient's records extensive and collaborated the history with the patient. Summary of her history is as follows: This patient was diagnosed with lymphoma in 2013. CT scan of the abdomen and pelvis on 10/02/2011 that noted shotty bilateral inguinal nodes and a hiatal hernia, otherwise negative. The liver, spleen and pancreas were unremarkable. There was no mesenteric or retroperitoneal adenopathy. She was referred to Dr. Neldon Mc who, on 11/18/2011, performed an excisional biopsy of right inguinal node. Surgical pathology revealed atypical follicular proliferation which was positive for BCl-2 and CD20. The overall impression was that this was clearly involvement of follicular lymphoma. (Case number [id number]). She was initiated on Rituxan treatments, 375 mg/m2=600 mg q wk x 4, From 01/30/2012-02/20/12. PET scan on 12/23/11 had shown axillary, subpectoral and inguinal lymphadenopathy. A PET scan on 02/19/12 did show a complete metabolic response to therapy with only 1 focus of very mild hypermetabolic activity in the right axilla. She is now on close surveillance.   INTERVAL HISTORY: Please see below for problem oriented charting. She is miserable with recurrent skin rashes and hives. She has poor appetite and fatigue. She have 2 episodes of shingles recently. Denies new lymphadenopathy.  REVIEW OF SYSTEMS:   Constitutional: Denies fevers, chills or abnormal weight loss Eyes: Denies blurriness of vision Ears, nose, mouth, throat, and face: Denies mucositis or sore throat Respiratory: Denies cough, dyspnea or wheezes Cardiovascular: Denies palpitation, chest discomfort or lower  extremity swelling Gastrointestinal:  Denies nausea, heartburn or change in bowel habits Lymphatics: Denies new lymphadenopathy or easy bruising Neurological:Denies numbness, tingling or new weaknesses Behavioral/Psych: Mood is stable, no new changes  All other systems were reviewed with the patient and are negative.  I have reviewed the past medical history, past surgical history, social history and family history with the patient and they are unchanged from previous note.  ALLERGIES:  is allergic to amoxicillin; aspirin; benadryl [diphenhydramine hcl]; ibuprofen; sulfa antibiotics; sulindac; and penicillins.  MEDICATIONS:  Current Outpatient Prescriptions  Medication Sig Dispense Refill  . acetaminophen (TYLENOL) 650 MG CR tablet Take 650 mg by mouth every 8 (eight) hours as needed for pain.     Marland Kitchen atenolol (TENORMIN) 50 MG tablet Take 50 mg by mouth daily.    . Calcium Carbonate-Vitamin D (CALCIUM-D) 600-400 MG-UNIT TABS Take by mouth daily.     . chlordiazePOXIDE (LIBRIUM) 10 MG capsule Take 10 mg by mouth 2 (two) times daily as needed for anxiety.     Marland Kitchen dextromethorphan-guaiFENesin (MUCINEX DM) 30-600 MG per 12 hr tablet Take 1 tablet by mouth daily as needed (sinus).     . fluticasone (FLONASE) 50 MCG/ACT nasal spray Place 2 sprays into the nose daily as needed for allergies.     . hydrochlorothiazide (HYDRODIURIL) 25 MG tablet Take 25 mg by mouth daily.    Marland Kitchen HYDROcodone-acetaminophen (NORCO) 5-325 MG per tablet Take 1 tablet by mouth every 6 (six) hours as needed for moderate pain.     . hyoscyamine (LEVSIN SL) 0.125 MG SL tablet Place 0.125 mg under the tongue every 4 (four) hours as needed for cramping. Reported on 09/25/2015    . meclizine (ANTIVERT) 50 MG tablet Take 0.5 tablets (25 mg total)  by mouth 3 (three) times daily as needed. 30 tablet 0  . Multiple Vitamin (MULTIVITAMIN) capsule Take 1 capsule by mouth daily.    . nabumetone (RELAFEN) 750 MG tablet Take 750 mg by mouth 2  (two) times daily.    . predniSONE (DELTASONE) 10 MG tablet Take 1 tablet (10 mg total) by mouth daily with breakfast. 30 tablet 0  . primidone (MYSOLINE) 50 MG tablet Take 1 tablet (50 mg total) by mouth 2 (two) times daily. 60 tablet 5  . Propylene Glycol (SYSTANE BALANCE) 0.6 % SOLN Apply 1-2 drops to eye 3 (three) times daily as needed (dry eyes.).     Marland Kitchen ranitidine (ZANTAC) 300 MG tablet Take 300 mg by mouth at bedtime as needed.     . sertraline (ZOLOFT) 50 MG tablet Take 50 mg by mouth daily.     No current facility-administered medications for this visit.     PHYSICAL EXAMINATION: ECOG PERFORMANCE STATUS: 1 - Symptomatic but completely ambulatory  Vitals:   05/02/16 0921  BP: (!) 146/62  Pulse: (!) 58  Resp: 17  Temp: 98.3 F (36.8 C)   Filed Weights   05/02/16 0921  Weight: 112 lb 9.6 oz (51.1 kg)    GENERAL:alert, no distress and comfortable SKIN: Diffuse hives are noted  EYES: normal, Conjunctiva are pink and non-injected, sclera clear Musculoskeletal:no cyanosis of digits and no clubbing  NEURO: alert & oriented x 3 with fluent speech, no focal motor/sensory deficits  LABORATORY DATA:  I have reviewed the data as listed    Component Value Date/Time   NA 139 04/25/2016 0934   K 4.2 04/25/2016 0934   CL 101 06/07/2014 1539   CL 103 10/09/2012 1036   CO2 27 04/25/2016 0934   GLUCOSE 72 04/25/2016 0934   GLUCOSE 89 10/09/2012 1036   BUN 22.8 04/25/2016 0934   CREATININE 1.0 04/25/2016 0934   CALCIUM 9.1 04/25/2016 0934   PROT 7.2 04/25/2016 0934   PROT 7.7 04/25/2016 0934   ALBUMIN 3.3 (L) 04/25/2016 0934   AST 59 (H) 04/25/2016 0934   ALT <9 04/25/2016 0934   ALKPHOS 96 04/25/2016 0934   BILITOT 0.33 04/25/2016 0934   GFRNONAA 54 (L) 06/07/2014 1539   GFRAA 63 (L) 06/07/2014 1539    No results found for: SPEP, UPEP  Lab Results  Component Value Date   WBC 8.9 04/25/2016   NEUTROABS 3.5 04/25/2016   HGB 10.7 (L) 04/25/2016   HCT 32.6 (L)  04/25/2016   MCV 96.4 04/25/2016   PLT 270 04/25/2016      Chemistry      Component Value Date/Time   NA 139 04/25/2016 0934   K 4.2 04/25/2016 0934   CL 101 06/07/2014 1539   CL 103 10/09/2012 1036   CO2 27 04/25/2016 0934   BUN 22.8 04/25/2016 0934   CREATININE 1.0 04/25/2016 0934      Component Value Date/Time   CALCIUM 9.1 04/25/2016 0934   ALKPHOS 96 04/25/2016 0934   AST 59 (H) 04/25/2016 0934   ALT <9 04/25/2016 0934   BILITOT 0.33 04/25/2016 0934     ASSESSMENT & PLAN:  History of B-cell lymphoma Clinically, she had no signs of disease recurrence. I will see her on a yearly basis with history, physical examination and blood work only. I would defer imaging study to the future only if she have signs of disease requiring treatment.  MGUS (monoclonal gammopathy of unknown significance) She has very mild disease progression from MGUS  standpoint but overall no life-threatening organ damage that would warrant the start of treatment. I recommend close observation and repeat myeloma panel in 6 months  Full body hives She is very distressed with recurrent diffuse hives and allergy-like symptoms. She had very mild eosinophilia. I recommend a trial of low-dose prednisone therapy at 10 mg daily and plan to reassess in 10 days. The risk, benefit, side effects of prednisone is discussed with the patient and she agreed to proceed   No orders of the defined types were placed in this encounter.  All questions were answered. The patient knows to call the clinic with any problems, questions or concerns. No barriers to learning was detected. I spent 15 minutes counseling the patient face to face. The total time spent in the appointment was 20 minutes and more than 50% was on counseling and review of test results     Heath Lark, MD 05/02/2016 1:57 PM

## 2016-05-02 NOTE — Telephone Encounter (Signed)
Avs report and appointment schedule given to patient, per 05/02/16 los. °

## 2016-05-02 NOTE — Assessment & Plan Note (Signed)
Clinically, she had no signs of disease recurrence. I will see her on a yearly basis with history, physical examination and blood work only. I would defer imaging study to the future only if she have signs of disease requiring treatment.

## 2016-05-13 ENCOUNTER — Telehealth: Payer: Self-pay | Admitting: Hematology and Oncology

## 2016-05-13 ENCOUNTER — Ambulatory Visit (HOSPITAL_BASED_OUTPATIENT_CLINIC_OR_DEPARTMENT_OTHER): Payer: Medicare Other | Admitting: Hematology and Oncology

## 2016-05-13 ENCOUNTER — Encounter: Payer: Self-pay | Admitting: Hematology and Oncology

## 2016-05-13 VITALS — BP 170/72 | HR 54 | Temp 98.4°F | Resp 18 | Ht 64.0 in | Wt 112.2 lb

## 2016-05-13 DIAGNOSIS — L509 Urticaria, unspecified: Secondary | ICD-10-CM

## 2016-05-13 DIAGNOSIS — D472 Monoclonal gammopathy: Secondary | ICD-10-CM | POA: Diagnosis not present

## 2016-05-13 DIAGNOSIS — I10 Essential (primary) hypertension: Secondary | ICD-10-CM

## 2016-05-13 NOTE — Assessment & Plan Note (Signed)
She has very mild disease progression from MGUS standpoint but overall no life-threatening organ damage that would warrant the start of treatment. I recommend close observation and repeat myeloma panel in 6 months

## 2016-05-13 NOTE — Telephone Encounter (Signed)
AVS report and appointment schedule, given to patient per 05/13/16 los.  °

## 2016-05-13 NOTE — Progress Notes (Signed)
[place] OFFICE PROGRESS NOTE  Patient Care Team: Gaynelle Arabian, MD as PCP - General (Family Medicine) Heath Lark, MD as Consulting Physician (Hematology and Oncology)  SUMMARY OF ONCOLOGIC HISTORY:  [name] was transferred to my care after her prior physician has left.  I reviewed the patient's records extensive and collaborated the history with the patient. Summary of her history is as follows: This patient was diagnosed with lymphoma in 2013. CT scan of the abdomen and pelvis on 10/02/2011 that noted shotty bilateral inguinal nodes and a hiatal hernia, otherwise negative. The liver, spleen and pancreas were unremarkable. There was no mesenteric or retroperitoneal adenopathy. She was referred to Dr. Neldon Mc who, on 11/18/2011, performed an excisional biopsy of right inguinal node. Surgical pathology revealed atypical follicular proliferation which was positive for BCl-2 and CD20. The overall impression was that this was clearly involvement of follicular lymphoma. (Case number SZ [id number]). She was initiated on Rituxan treatments, 375 mg/m2=600 mg q wk x 4, From 01/30/2012-02/20/12. PET scan on 12/23/11 had shown axillary, subpectoral and inguinal lymphadenopathy. A PET scan on 02/19/12 did show a complete metabolic response to therapy with only 1 focus of very mild hypermetabolic activity in the right axilla. She is now on close surveillance.  She was started on prednisone therapy on October 12 for significant hives  INTERVAL HISTORY: Please see below for problem oriented charting. She felt better. She has excellent appetite and energy and resolution of her high since she started on prednisone therapy. She denies side effects from treatment. Denies recent infection No recent fever or chills  REVIEW OF SYSTEMS:   Constitutional: Denies fevers, chills or abnormal weight loss Eyes: Denies blurriness of vision Ears, nose, mouth, throat, and face: Denies mucositis  or sore throat Respiratory: Denies cough, dyspnea or wheezes Cardiovascular: Denies palpitation, chest discomfort or lower extremity swelling Gastrointestinal:  Denies nausea, heartburn or change in bowel habits Lymphatics: Denies new lymphadenopathy or easy bruising Neurological:Denies numbness, tingling or new weaknesses Behavioral/Psych: Mood is stable, no new changes  All other systems were reviewed with the patient and are negative.  I have reviewed the past medical history, past surgical history, social history and family history with the patient and they are unchanged from previous note.  ALLERGIES:  is allergic to amoxicillin; aspirin; benadryl [diphenhydramine hcl]; ibuprofen; sulfa antibiotics; sulindac; and penicillins.  MEDICATIONS:  Current Outpatient Prescriptions  Medication Sig Dispense Refill  . acetaminophen (TYLENOL) 650 MG CR tablet Take 650 mg by mouth every 8 (eight) hours as needed for pain.     Marland Kitchen atenolol (TENORMIN) 50 MG tablet Take 50 mg by mouth daily.    . Calcium Carbonate-Vitamin D (CALCIUM-D) 600-400 MG-UNIT TABS Take by mouth daily.     . chlordiazePOXIDE (LIBRIUM) 10 MG capsule Take 10 mg by mouth 2 (two) times daily as needed for anxiety.     Marland Kitchen dextromethorphan-guaiFENesin (MUCINEX DM) 30-600 MG per 12 hr tablet Take 1 tablet by mouth daily as needed (sinus).     . fluticasone (FLONASE) 50 MCG/ACT nasal spray Place 2 sprays into the nose daily as needed for allergies.     . hydrochlorothiazide (HYDRODIURIL) 25 MG tablet Take 25 mg by mouth daily.    Marland Kitchen HYDROcodone-acetaminophen (NORCO) 5-325 MG per tablet Take 1 tablet by mouth every 6 (six) hours as needed for moderate pain.     . hyoscyamine (LEVSIN SL) 0.125 MG SL tablet Place 0.125 mg under the tongue every 4 (four) hours as  needed for cramping. Reported on 09/25/2015    . meclizine (ANTIVERT) 50 MG tablet Take 0.5 tablets (25 mg total) by mouth 3 (three) times daily as needed. 30 tablet 0  . Multiple  Vitamin (MULTIVITAMIN) capsule Take 1 capsule by mouth daily.    . nabumetone (RELAFEN) 750 MG tablet Take 750 mg by mouth 2 (two) times daily.    . predniSONE (DELTASONE) 10 MG tablet Take 1 tablet (10 mg total) by mouth daily with breakfast. 30 tablet 0  . primidone (MYSOLINE) 50 MG tablet Take 1 tablet (50 mg total) by mouth 2 (two) times daily. 60 tablet 5  . Propylene Glycol (SYSTANE BALANCE) 0.6 % SOLN Apply 1-2 drops to eye 3 (three) times daily as needed (dry eyes.).     Marland Kitchen ranitidine (ZANTAC) 300 MG tablet Take 300 mg by mouth at bedtime as needed.     . sertraline (ZOLOFT) 50 MG tablet Take 50 mg by mouth daily.     No current facility-administered medications for this visit.     PHYSICAL EXAMINATION: ECOG PERFORMANCE STATUS: 1 - Symptomatic but completely ambulatory  Vitals:   05/13/16 1054  BP: (!) 170/72  Pulse: (!) 54  Resp: 18  Temp: 98.4 F (36.9 C)   Filed Weights   05/13/16 1054  Weight: 112 lb 3.2 oz (50.9 kg)    GENERAL:alert, no distress and comfortable SKIN: Hives has resolved EYES: normal, Conjunctiva are pink and non-injected, sclera clear Musculoskeletal:no cyanosis of digits and no clubbing  NEURO: alert & oriented x 3 with fluent speech, no focal motor/sensory deficits  LABORATORY DATA:  I have reviewed the data as listed    Component Value Date/Time   NA 139 04/25/2016 0934   K 4.2 04/25/2016 0934   CL 101 06/07/2014 1539   CL 103 10/09/2012 1036   CO2 27 04/25/2016 0934   GLUCOSE 72 04/25/2016 0934   GLUCOSE 89 10/09/2012 1036   BUN 22.8 04/25/2016 0934   CREATININE 1.0 04/25/2016 0934   CALCIUM 9.1 04/25/2016 0934   PROT 7.2 04/25/2016 0934   PROT 7.7 04/25/2016 0934   ALBUMIN 3.3 (L) 04/25/2016 0934   AST 59 (H) 04/25/2016 0934   ALT <9 04/25/2016 0934   ALKPHOS 96 04/25/2016 0934   BILITOT 0.33 04/25/2016 0934   GFRNONAA 54 (L) 06/07/2014 1539   GFRAA 63 (L) 06/07/2014 1539    No results found for: SPEP, UPEP  Lab Results   Component Value Date   WBC 8.9 04/25/2016   NEUTROABS 3.5 04/25/2016   HGB 10.7 (L) 04/25/2016   HCT 32.6 (L) 04/25/2016   MCV 96.4 04/25/2016   PLT 270 04/25/2016      Chemistry      Component Value Date/Time   NA 139 04/25/2016 0934   K 4.2 04/25/2016 0934   CL 101 06/07/2014 1539   CL 103 10/09/2012 1036   CO2 27 04/25/2016 0934   BUN 22.8 04/25/2016 0934   CREATININE 1.0 04/25/2016 0934      Component Value Date/Time   CALCIUM 9.1 04/25/2016 0934   ALKPHOS 96 04/25/2016 0934   AST 59 (H) 04/25/2016 0934   ALT <9 04/25/2016 0934   BILITOT 0.33 04/25/2016 0934      ASSESSMENT & PLAN:  MGUS (monoclonal gammopathy of unknown significance) She has very mild disease progression from MGUS standpoint but overall no life-threatening organ damage that would warrant the start of treatment. I recommend close observation and repeat myeloma panel in 6 months  Full body hives This has improved since recent prednisone therapy. I recommend she reduce prednisone to 5 mg daily until the weekend and then stop.  Systolic hypertension Her blood pressure is elevated but could be due to fluid retention related to recent prednisone therapy. I will initiate prednisone taper   Orders Placed This Encounter  Procedures  . Comprehensive metabolic panel    Standing Status:   Future    Standing Expiration Date:   06/17/2017  . CBC with Differential/Platelet    Standing Status:   Future    Standing Expiration Date:   06/17/2017  . Multiple Myeloma Panel (SPEP&IFE w/QIG)    Standing Status:   Future    Standing Expiration Date:   06/17/2017  . Kappa/lambda light chains    Standing Status:   Future    Standing Expiration Date:   06/17/2017   All questions were answered. The patient knows to call the clinic with any problems, questions or concerns. No barriers to learning was detected. I spent 15 minutes counseling the patient face to face. The total time spent in the appointment was 20  minutes and more than 50% was on counseling and review of test results     Heath Lark, MD 05/13/2016 12:18 PM

## 2016-05-13 NOTE — Assessment & Plan Note (Signed)
This has improved since recent prednisone therapy. I recommend she reduce prednisone to 5 mg daily until the weekend and then stop.

## 2016-05-13 NOTE — Assessment & Plan Note (Signed)
Her blood pressure is elevated but could be due to fluid retention related to recent prednisone therapy. I will initiate prednisone taper

## 2016-05-14 ENCOUNTER — Ambulatory Visit: Payer: Medicare Other

## 2016-05-17 NOTE — Progress Notes (Signed)
Subjective:   [name] was seen in consultation in the movement disorder clinic at the request of Simona Huh, MD.  The evaluation is for tremor.  The patient is a 82 y.o. right handed female with a history of tremor.  Pt reports that she has had tremor for about 10 years and remembers having trouble at work when she was typing and she thought that was the problem.  Tremor has increased over the last few years.  Tremor increases with stress.  The right hand is worse than the L but "it is moving to the L."  States that she feels tremor on the inside and in the legs States that she is having trouble writing because of the tremor, but not because of micrographia.  States that she just wanted a "pill" for the tremor.  There is a family hx of tremor in her mother, her sister and her niece.    Tremor characteristics: Affected by caffeine:  No. (only 1/2 cup coffee in AM) Affected by alcohol: doesn't drink any alcohol Affected by stress:  Yes.   Affected by fatigue:  Yes.   Spills soup if on spoon:  No., but has to hold it steady or hold the wrist Spills glass of liquid if full:  No., but has to hold with both hands Affects ADL's (tying shoes, brushing teeth, etc):  Yes.   (brushing teeth)  10/24/15:  The patient follows up today, just to go over laboratory results.  I started her on primidone last visit for essential tremor, and she has only been on that for a few days, but she states that she is much better.  She has been a little dizzy with it. "I can write and I don't have the shakes like I did."  She has a little nausea.   I did a lab workup for reversible causes of peripheral neuropathy and found that she had evidence of IgG kappa light chains on her urine immunoelectrophoresis.  Her serum electrophoresis also revealed monoclonal protein.  01/16/16 update:  The patient is seen today in follow-up.  I have reviewed records since our last visit.  She is on primidone, 50 mg for essential  tremor.  She thinks that it is usually very helpful and occasionally she will note trouble writing.  She called me at the end of April and stated that she had developed a rash at her waistline and she thought it was from the primidone.  I had doubted that and asked her to follow-up with her primary care physician, which she did.  I got a note from her primary care physician that she was seen on April 27 and told that the rash was from shingles and not from primidone and that her symptoms were mild.  She was started on zara lotion.  She is doing better from that regard.  Last visit, I talked her about the fact that she had an M spike and ended up talking to Dr. Alvy Bimler, who she sees for follicular lymphoma.  Dr. Alvy Bimler thought that this was unrelated to the lymphoma, but said that she would like the patient follow-up in 6 months instead of one year.  05/20/16 update:  Patient is seen today in follow-up.  Last visit, I increased her primidone to 50 mg - 1/2 to 1 tablet in the AM and 1 tablet at night.  She is taking 1 po bid. The patient states that she is doing better and shaking less.   No  SE and doesn't make her sleepy.   No falls but "I have come close."  I have reviewed records since last visit.  She saw Dr. Alvy Bimler on 05/13/2016.  She just recommend repeat myeloma panel in 6 months.  Current/Previously tried tremor medications: on atenolol but for BP  Current medications that may exacerbate tremor:  none  Outside reports reviewed: office notes and referral letter/letters.  Allergies  Allergen Reactions  . Amoxicillin Diarrhea and Nausea And Vomiting  . Aspirin Nausea And Vomiting and Other (See Comments)    Heartburn,  Indigestion.  . Benadryl [Diphenhydramine Hcl] Nausea And Vomiting  . Ibuprofen Nausea And Vomiting and Palpitations  . Sulfa Antibiotics Itching and Nausea And Vomiting  . Sulindac     Ineffective for arthritis  . Penicillins Rash    Outpatient Encounter Prescriptions as  of 05/20/2016  Medication Sig  . acetaminophen (TYLENOL) 650 MG CR tablet Take 650 mg by mouth every 8 (eight) hours as needed for pain.   Marland Kitchen atenolol (TENORMIN) 50 MG tablet Take 50 mg by mouth daily.  . Calcium Carbonate-Vitamin D (CALCIUM-D) 600-400 MG-UNIT TABS Take by mouth daily.   . chlordiazePOXIDE (LIBRIUM) 10 MG capsule Take 10 mg by mouth 2 (two) times daily as needed for anxiety.   Marland Kitchen dextromethorphan-guaiFENesin (MUCINEX DM) 30-600 MG per 12 hr tablet Take 1 tablet by mouth daily as needed (sinus).   . fluticasone (FLONASE) 50 MCG/ACT nasal spray Place 2 sprays into the nose daily as needed for allergies.   . hydrochlorothiazide (HYDRODIURIL) 25 MG tablet Take 25 mg by mouth daily.  Marland Kitchen HYDROcodone-acetaminophen (NORCO) 5-325 MG per tablet Take 1 tablet by mouth every 6 (six) hours as needed for moderate pain.   . hyoscyamine (LEVSIN SL) 0.125 MG SL tablet Place 0.125 mg under the tongue every 4 (four) hours as needed for cramping. Reported on 09/25/2015  . meclizine (ANTIVERT) 50 MG tablet Take 0.5 tablets (25 mg total) by mouth 3 (three) times daily as needed.  . Multiple Vitamin (MULTIVITAMIN) capsule Take 1 capsule by mouth daily.  . nabumetone (RELAFEN) 750 MG tablet Take 750 mg by mouth 2 (two) times daily.  . primidone (MYSOLINE) 50 MG tablet Take 1 tablet (50 mg total) by mouth 2 (two) times daily.  Marland Kitchen Propylene Glycol (SYSTANE BALANCE) 0.6 % SOLN Apply 1-2 drops to eye 3 (three) times daily as needed (dry eyes.).   Marland Kitchen ranitidine (ZANTAC) 300 MG tablet Take 300 mg by mouth at bedtime as needed.   . sertraline (ZOLOFT) 50 MG tablet Take 50 mg by mouth daily.  . [DISCONTINUED] predniSONE (DELTASONE) 10 MG tablet Take 1 tablet (10 mg total) by mouth daily with breakfast.   No facility-administered encounter medications on file as of 05/20/2016.     Past Medical History:  Diagnosis Date  . Allergy   . Cancer ([place])    follicular lymphoma  . COPD (chronic obstructive pulmonary  disease) ([place])   . Depression   . DJD (degenerative joint disease)   . Dysuria 09/25/2015  . GERD (gastroesophageal reflux disease)   . Heart murmur    had echo 20 yr ago-not sure where  . Hx of colonic polyp 10/02/05  . Hypertension   . MGUS (monoclonal gammopathy of unknown significance) 10/24/2015  . MVP (mitral valve prolapse)   . Osteoporosis     Past Surgical History:  Procedure Laterality Date  . APPENDECTOMY    . CATARACT EXTRACTION Bilateral   . COLONOSCOPY    .  DILATION AND CURETTAGE OF UTERUS     several  . OVARIAN CYST REMOVAL      Social History   Social History  . Marital status: Legally Separated    Spouse name: N/A  . Number of children: N/A  . Years of education: N/A   Occupational History  . Not on file.   Social History Main Topics  . Smoking status: Never Smoker  . Smokeless tobacco: Never Used  . Alcohol use No  . Drug use: No  . Sexual activity: Not on file   Other Topics Concern  . Not on file   Social History Narrative  . No narrative on file    Family Status  Relation Status  . Mother Deceased at age 34   strokes, tremor  . Father Deceased at age 49   strokes  . Brother Deceased   prostate cancer  . Sister Deceased   fall/head trauma/stroke  . Sister Deceased   lung cancer  . Daughter Alive   healthy, thyroid disease    Review of Systems Some indigestion with certain foods.  A complete 10 system ROS was obtained and was negative apart from what is mentioned.   Objective:   VITALS:   Vitals:   05/20/16 1057  BP: 136/70  Pulse: (!) 56  Weight: 112 lb (50.8 kg)  Height: 5\' 4"  (1.626 m)   Gen:  Appears stated age and in NAD. HEENT:  Normocephalic, atraumatic. The mucous membranes are moist. The superficial temporal arteries are without ropiness or tenderness. Cardiovascular: Regular rate and rhythm. Lungs: Clear to auscultation bilaterally. Neck: There are no carotid bruits noted  bilaterally.  NEUROLOGICAL:  Orientation:  The patient is alert and oriented x 3.   Cranial nerves: There is good facial symmetry. Speech is fluent and clear. Soft palate rises symmetrically and there is no tongue deviation. Hearing is intact to conversational tone. Tone: Tone is good throughout. Sensation: Sensation is intact to light touch throughout Coordination:  The patient has no dysdiadichokinesia or dysmetria. Motor: Strength is at least antigravity x 4.   Gait and Station: The patient is able to ambulate without difficulty.  Abnormal movements:  There is mild tremor of outstretched hands on the L.  Doesn't change with intention.  R is good today.     Labs:    Chemistry      Component Value Date/Time   NA 139 04/25/2016 0934   K 4.2 04/25/2016 0934   CL 101 06/07/2014 1539   CL 103 10/09/2012 1036   CO2 27 04/25/2016 0934   BUN 22.8 04/25/2016 0934   CREATININE 1.0 04/25/2016 0934      Component Value Date/Time   CALCIUM 9.1 04/25/2016 0934   ALKPHOS 96 04/25/2016 0934   AST 59 (H) 04/25/2016 0934   ALT <9 04/25/2016 0934   BILITOT 0.33 04/25/2016 0934     Lab Results  Component Value Date   TSH 1.50 10/16/2015    Lab Results  Component Value Date   WBC 8.9 04/25/2016   HGB 10.7 (L) 04/25/2016   HCT 32.6 (L) 04/25/2016   MCV 96.4 04/25/2016   PLT 270 04/25/2016      Assessment/Plan:   1.  Essential Tremor.  -continue primidone 50 mg - 1 tablet in the AM and 1 tablet at night. 2.  Peripheral neuropathy  -lab w/u demonstrated kappa light chains.  Does have h/o follicular lymphoma 3.  MGUS  -Dr. Alvy Bimler doesn't think related to follicular lymphoma and  following her closer and next f/u in October 4.  F/u 6-8 months.

## 2016-05-20 ENCOUNTER — Ambulatory Visit (INDEPENDENT_AMBULATORY_CARE_PROVIDER_SITE_OTHER): Payer: Medicare Other | Admitting: Neurology

## 2016-05-20 ENCOUNTER — Encounter: Payer: Self-pay | Admitting: Neurology

## 2016-05-20 VITALS — BP 136/70 | HR 56 | Ht 64.0 in | Wt 112.0 lb

## 2016-05-20 DIAGNOSIS — G250 Essential tremor: Secondary | ICD-10-CM

## 2016-06-04 ENCOUNTER — Ambulatory Visit
Admission: RE | Admit: 2016-06-04 | Discharge: 2016-06-04 | Disposition: A | Discharge status: Home / Self Care | Payer: Medicare Other | Source: Ambulatory Visit | Attending: Family Medicine | Admitting: Family Medicine

## 2016-06-04 DIAGNOSIS — Z1231 Encounter for screening mammogram for malignant neoplasm of breast: Secondary | ICD-10-CM

## 2016-07-24 ENCOUNTER — Other Ambulatory Visit: Payer: Self-pay | Admitting: Neurology

## 2016-09-23 ENCOUNTER — Other Ambulatory Visit: Payer: Medicare Other

## 2016-09-23 ENCOUNTER — Ambulatory Visit: Payer: Medicare Other | Admitting: Hematology

## 2016-11-05 ENCOUNTER — Other Ambulatory Visit (HOSPITAL_BASED_OUTPATIENT_CLINIC_OR_DEPARTMENT_OTHER): Payer: Medicare Other

## 2016-11-05 DIAGNOSIS — D472 Monoclonal gammopathy: Secondary | ICD-10-CM

## 2016-11-05 LAB — CBC WITH DIFFERENTIAL/PLATELET
BASO%: 1.2 % (ref 0.0–2.0)
Basophils Absolute: 0.1 10*3/uL (ref 0.0–0.1)
EOS ABS: 0.6 10*3/uL — AB (ref 0.0–0.5)
EOS%: 12.2 % — ABNORMAL HIGH (ref 0.0–7.0)
HEMATOCRIT: 33.5 % — AB (ref 34.8–46.6)
HEMOGLOBIN: 11.3 g/dL — AB (ref 11.6–15.9)
LYMPH%: 31.4 % (ref 14.0–49.7)
MCH: 32.6 pg (ref 25.1–34.0)
MCHC: 33.6 g/dL (ref 31.5–36.0)
MCV: 97 fL (ref 79.5–101.0)
MONO#: 0.4 10*3/uL (ref 0.1–0.9)
MONO%: 7.4 % (ref 0.0–14.0)
NEUT%: 47.8 % (ref 38.4–76.8)
NEUTROS ABS: 2.3 10*3/uL (ref 1.5–6.5)
PLATELETS: 148 10*3/uL (ref 145–400)
RBC: 3.45 10*6/uL — ABNORMAL LOW (ref 3.70–5.45)
RDW: 13.4 % (ref 11.2–14.5)
WBC: 4.8 10*3/uL (ref 3.9–10.3)
lymph#: 1.5 10*3/uL (ref 0.9–3.3)

## 2016-11-05 LAB — COMPREHENSIVE METABOLIC PANEL
ALBUMIN: 3.7 g/dL (ref 3.5–5.0)
ALT: 8 U/L (ref 0–55)
AST: 17 U/L (ref 5–34)
Alkaline Phosphatase: 67 U/L (ref 40–150)
Anion Gap: 8 mEq/L (ref 3–11)
BUN: 25.4 mg/dL (ref 7.0–26.0)
CALCIUM: 9.4 mg/dL (ref 8.4–10.4)
CHLORIDE: 104 meq/L (ref 98–109)
CO2: 28 mEq/L (ref 22–29)
Creatinine: 1 mg/dL (ref 0.6–1.1)
EGFR: 49 mL/min/{1.73_m2} — ABNORMAL LOW (ref >90)
Glucose: 88 mg/dl (ref 70–140)
POTASSIUM: 4.2 meq/L (ref 3.5–5.1)
Sodium: 140 mEq/L (ref 136–145)
Total Bilirubin: 0.44 mg/dL (ref 0.20–1.20)
Total Protein: 7.5 g/dL (ref 6.4–8.3)

## 2016-11-06 LAB — KAPPA/LAMBDA LIGHT CHAINS
Ig Kappa Free Light Chain: 105.5 mg/L — ABNORMAL HIGH (ref 3.3–19.4)
Ig Lambda Free Light Chain: 8.9 mg/L (ref 5.7–26.3)
KAPPA/LAMBDA FLC RATIO: 11.85 — AB (ref 0.26–1.65)

## 2016-11-08 LAB — MULTIPLE MYELOMA PANEL, SERUM
Albumin SerPl Elph-Mcnc: 3.7 g/dL (ref 2.9–4.4)
Albumin/Glob SerPl: 1.2 (ref 0.7–1.7)
Alpha 1: 0.3 g/dL (ref 0.0–0.4)
Alpha2 Glob SerPl Elph-Mcnc: 0.6 g/dL (ref 0.4–1.0)
B-GLOBULIN SERPL ELPH-MCNC: 0.8 g/dL (ref 0.7–1.3)
GAMMA GLOB SERPL ELPH-MCNC: 1.7 g/dL (ref 0.4–1.8)
GLOBULIN, TOTAL: 3.3 g/dL (ref 2.2–3.9)
IgA, Qn, Serum: 31 mg/dL — ABNORMAL LOW (ref 64–422)
IgG, Qn, Serum: 1831 mg/dL — ABNORMAL HIGH (ref 700–1600)
IgM, Qn, Serum: 15 mg/dL — ABNORMAL LOW (ref 26–217)
M PROTEIN SERPL ELPH-MCNC: 1.5 g/dL — AB
TOTAL PROTEIN: 7 g/dL (ref 6.0–8.5)

## 2016-11-12 ENCOUNTER — Ambulatory Visit (HOSPITAL_BASED_OUTPATIENT_CLINIC_OR_DEPARTMENT_OTHER): Payer: Medicare Other | Admitting: Hematology and Oncology

## 2016-11-12 ENCOUNTER — Encounter: Payer: Self-pay | Admitting: Hematology and Oncology

## 2016-11-12 ENCOUNTER — Telehealth: Payer: Self-pay | Admitting: Hematology and Oncology

## 2016-11-12 VITALS — BP 181/82 | HR 56 | Temp 97.8°F | Resp 16 | Ht 64.0 in | Wt 118.6 lb

## 2016-11-12 DIAGNOSIS — D472 Monoclonal gammopathy: Secondary | ICD-10-CM | POA: Diagnosis not present

## 2016-11-12 DIAGNOSIS — I10 Essential (primary) hypertension: Secondary | ICD-10-CM | POA: Diagnosis not present

## 2016-11-12 DIAGNOSIS — Z8572 Personal history of non-Hodgkin lymphomas: Secondary | ICD-10-CM | POA: Diagnosis not present

## 2016-11-12 NOTE — Assessment & Plan Note (Signed)
The patient has recurrence of lymphadenopathy on the right side of her neck, no doubt, representing recurrence of follicular lymphoma Overall disease burden is small and she is relatively asymptomatic Blood work is stable Recommend observation in 3 months If she show significant disease progression, we will repeat imaging study and possibly biopsy before treatment.

## 2016-11-12 NOTE — Assessment & Plan Note (Signed)
She has very mild disease progression from MGUS standpoint but overall no life-threatening organ damage that would warrant the start of treatment. I recommend close observation and repeat myeloma panel in 6 months

## 2016-11-12 NOTE — Assessment & Plan Note (Signed)
Her blood pressure is elevated but likely due to whitecoat hypertension Documented blood pressure month ago was reasonable She will continue medical management

## 2016-11-12 NOTE — Telephone Encounter (Signed)
Gave patient AVS and calender per 4/24 los.  

## 2016-11-12 NOTE — Progress Notes (Signed)
[facility] OFFICE PROGRESS NOTE  Patient Care Team: Gaynelle Arabian, MD as PCP - General (Family Medicine) Heath Lark, MD as Consulting Physician (Hematology and Oncology)  SUMMARY OF ONCOLOGIC HISTORY:  [name] was transferred to my care after her prior physician has left.  I reviewed the patient's records extensive and collaborated the history with the patient. Summary of her history is as follows: This patient was diagnosed with lymphoma in 2013. CT scan of the abdomen and pelvis on 10/02/2011 that noted shotty bilateral inguinal nodes and a hiatal hernia, otherwise negative. The liver, spleen and pancreas were unremarkable. There was no mesenteric or retroperitoneal adenopathy. She was referred to Dr. Neldon Barr who, on 11/18/2011, performed an excisional biopsy of right inguinal node. Surgical pathology revealed atypical follicular proliferation which was positive for BCl-2 and CD20. The overall impression was that this was clearly involvement of follicular lymphoma. (Case number SZ [id number]). She was initiated on Rituxan treatments, 375 mg/m2=600 mg q wk x 4, From 01/30/2012-02/20/12. PET scan on 12/23/11 had shown axillary, subpectoral and inguinal lymphadenopathy. A PET scan on 02/19/12 did show a complete metabolic response to therapy with only 1 focus of very mild hypermetabolic activity in the right axilla. She is now on close surveillance.  She was started on placed on intermittent prednisone for hives  INTERVAL HISTORY: Please see below for problem oriented charting. She has noticed new, recurrence lymphadenopathy in the right supraclavicular area and right side of the neck It does not bother her She denies anorexia, weight loss or night sweats Denies recent infection.  No recent bone pain She has chronic, recurrent skin rashes that comes and goes with sun exposure.  She thought the abnormalities on her face is due to rosacea  REVIEW OF SYSTEMS:    Constitutional: Denies fevers, chills or abnormal weight loss Eyes: Denies blurriness of vision Ears, nose, mouth, throat, and face: Denies mucositis or sore throat Respiratory: Denies cough, dyspnea or wheezes Cardiovascular: Denies palpitation, chest discomfort or lower extremity swelling Gastrointestinal:  Denies nausea, heartburn or change in bowel habits Neurological:Denies numbness, tingling or new weaknesses Behavioral/Psych: Mood is stable, no new changes  All other systems were reviewed with the patient and are negative.  I have reviewed the past medical history, past surgical history, social history and family history with the patient and they are unchanged from previous note.  ALLERGIES:  is allergic to amoxicillin; aspirin; benadryl [diphenhydramine hcl]; ibuprofen; sulfa antibiotics; sulindac; and penicillins.  MEDICATIONS:  Current Outpatient Prescriptions  Medication Sig Dispense Refill  . acetaminophen (TYLENOL) 650 MG CR tablet Take 650 mg by mouth every 8 (eight) hours as needed for pain.     Marland Kitchen atenolol (TENORMIN) 50 MG tablet Take 50 mg by mouth daily.    . Calcium Carbonate-Vitamin D (CALCIUM-D) 600-400 MG-UNIT TABS Take by mouth daily.     . chlordiazePOXIDE (LIBRIUM) 10 MG capsule Take 10 mg by mouth 2 (two) times daily as needed for anxiety.     Marland Kitchen dextromethorphan-guaiFENesin (MUCINEX DM) 30-600 MG per 12 hr tablet Take 1 tablet by mouth daily as needed (sinus).     . fluticasone (FLONASE) 50 MCG/ACT nasal spray Place 2 sprays into the nose daily as needed for allergies.     . hydrochlorothiazide (HYDRODIURIL) 25 MG tablet Take 25 mg by mouth daily.    Marland Kitchen HYDROcodone-acetaminophen (NORCO) 5-325 MG per tablet Take 1 tablet by mouth every 6 (six) hours as needed for moderate pain.     Marland Kitchen  hyoscyamine (LEVSIN SL) 0.125 MG SL tablet Place 0.125 mg under the tongue every 4 (four) hours as needed for cramping. Reported on 09/25/2015    . meclizine (ANTIVERT) 50 MG tablet Take  0.5 tablets (25 mg total) by mouth 3 (three) times daily as needed. 30 tablet 0  . Multiple Vitamin (MULTIVITAMIN) capsule Take 1 capsule by mouth daily.    . nabumetone (RELAFEN) 750 MG tablet Take 750 mg by mouth 2 (two) times daily.    . primidone (MYSOLINE) 50 MG tablet take 1 tablet by mouth twice a day 60 tablet 5  . Propylene Glycol (SYSTANE BALANCE) 0.6 % SOLN Apply 1-2 drops to eye 3 (three) times daily as needed (dry eyes.).     Marland Kitchen ranitidine (ZANTAC) 300 MG tablet Take 300 mg by mouth at bedtime as needed.     . sertraline (ZOLOFT) 50 MG tablet Take 50 mg by mouth daily.     No current facility-administered medications for this visit.     PHYSICAL EXAMINATION: ECOG PERFORMANCE STATUS: 1 - Symptomatic but completely ambulatory  Vitals:   11/12/16 0901  BP: (!) 181/82  Pulse: (!) 56  Resp: 16  Temp: 97.8 F (36.6 C)   Filed Weights   11/12/16 0901  Weight: 118 lb 9.6 oz (53.8 kg)    GENERAL:alert, no distress and comfortable SKIN: Noted mild skin rashes resembling rosacea on the face EYES: normal, Conjunctiva are pink and non-injected, sclera clear OROPHARYNX:no exudate, no erythema and lips, buccal mucosa, and tongue normal  NECK: supple, thyroid normal size, non-tender, without nodularity LYMPH: She has palpable lymphadenopathy on the right side of the neck and supraclavicular region, suspicious for lymphoma recurrence  LUNGS: clear to auscultation and percussion with normal breathing effort HEART: regular rate & rhythm and no murmurs and no lower extremity edema ABDOMEN:abdomen soft, non-tender and normal bowel sounds Musculoskeletal:no cyanosis of digits and no clubbing  NEURO: alert & oriented x 3 with fluent speech, no focal motor/sensory deficits  LABORATORY DATA:  I have reviewed the data as listed    Component Value Date/Time   NA 140 11/05/2016 1011   K 4.2 11/05/2016 1011   CL 101 06/07/2014 1539   CL 103 10/09/2012 1036   CO2 28 11/05/2016 1011    GLUCOSE 88 11/05/2016 1011   GLUCOSE 89 10/09/2012 1036   BUN 25.4 11/05/2016 1011   CREATININE 1.0 11/05/2016 1011   CALCIUM 9.4 11/05/2016 1011   PROT 7.5 11/05/2016 1011   PROT 7.0 11/05/2016 1011   ALBUMIN 3.7 11/05/2016 1011   AST 17 11/05/2016 1011   ALT 8 11/05/2016 1011   ALKPHOS 67 11/05/2016 1011   BILITOT 0.44 11/05/2016 1011   GFRNONAA 54 (L) 06/07/2014 1539   GFRAA 63 (L) 06/07/2014 1539    No results found for: SPEP, UPEP  Lab Results  Component Value Date   WBC 4.8 11/05/2016   NEUTROABS 2.3 11/05/2016   HGB 11.3 (L) 11/05/2016   HCT 33.5 (L) 11/05/2016   MCV 97.0 11/05/2016   PLT 148 11/05/2016      Chemistry      Component Value Date/Time   NA 140 11/05/2016 1011   K 4.2 11/05/2016 1011   CL 101 06/07/2014 1539   CL 103 10/09/2012 1036   CO2 28 11/05/2016 1011   BUN 25.4 11/05/2016 1011   CREATININE 1.0 11/05/2016 1011      Component Value Date/Time   CALCIUM 9.4 11/05/2016 1011   ALKPHOS 67 11/05/2016 1011  AST 17 11/05/2016 1011   ALT 8 11/05/2016 1011   BILITOT 0.44 11/05/2016 1011       ASSESSMENT & PLAN:  History of B-cell lymphoma The patient has recurrence of lymphadenopathy on the right side of her neck, no doubt, representing recurrence of follicular lymphoma Overall disease burden is small and she is relatively asymptomatic Blood work is stable Recommend observation in 3 months If she show significant disease progression, we will repeat imaging study and possibly biopsy before treatment.  MGUS (monoclonal gammopathy of unknown significance) She has very mild disease progression from MGUS standpoint but overall no life-threatening organ damage that would warrant the start of treatment. I recommend close observation and repeat myeloma panel in 6 months  Systolic hypertension Her blood pressure is elevated but likely due to whitecoat hypertension Documented blood pressure month ago was reasonable She will continue medical  management   Orders Placed This Encounter  Procedures  . Comprehensive metabolic panel    Standing Status:   Future    Standing Expiration Date:   12/17/2017  . CBC with Differential/Platelet    Standing Status:   Future    Standing Expiration Date:   12/17/2017  . Lactate dehydrogenase    Standing Status:   Future    Standing Expiration Date:   11/12/2017   All questions were answered. The patient knows to call the clinic with any problems, questions or concerns. No barriers to learning was detected. I spent 15 minutes counseling the patient face to face. The total time spent in the appointment was 20 minutes and more than 50% was on counseling and review of test results     Heath Lark, MD 11/12/2016 9:09 AM

## 2017-01-08 ENCOUNTER — Other Ambulatory Visit: Payer: Self-pay | Admitting: Family Medicine

## 2017-01-08 ENCOUNTER — Ambulatory Visit
Admission: RE | Admit: 2017-01-08 | Discharge: 2017-01-08 | Disposition: A | Discharge status: Home / Self Care | Payer: Medicare Other | Source: Ambulatory Visit | Attending: Family Medicine | Admitting: Family Medicine

## 2017-01-08 DIAGNOSIS — R0600 Dyspnea, unspecified: Secondary | ICD-10-CM

## 2017-01-10 ENCOUNTER — Other Ambulatory Visit: Payer: Self-pay | Admitting: Family Medicine

## 2017-01-10 DIAGNOSIS — R600 Localized edema: Secondary | ICD-10-CM

## 2017-01-17 NOTE — Progress Notes (Signed)
Subjective:   [name] was seen in consultation in the movement disorder clinic at the request of Gaynelle Arabian, MD.  The evaluation is for tremor.  The patient is a 83 y.o. right handed female with a history of tremor.  Pt reports that she has had tremor for about 10 years and remembers having trouble at work when she was typing and she thought that was the problem.  Tremor has increased over the last few years.  Tremor increases with stress.  The right hand is worse than the L but "it is moving to the L."  States that she feels tremor on the inside and in the legs States that she is having trouble writing because of the tremor, but not because of micrographia.  States that she just wanted a "pill" for the tremor.  There is a family hx of tremor in her mother, her sister and her niece.    Tremor characteristics: Affected by caffeine:  No. (only 1/2 cup coffee in AM) Affected by alcohol: doesn't drink any alcohol Affected by stress:  Yes.   Affected by fatigue:  Yes.   Spills soup if on spoon:  No., but has to hold it steady or hold the wrist Spills glass of liquid if full:  No., but has to hold with both hands Affects ADL's (tying shoes, brushing teeth, etc):  Yes.   (brushing teeth)  10/24/15:  The patient follows up today, just to go over laboratory results.  I started her on primidone last visit for essential tremor, and she has only been on that for a few days, but she states that she is much better.  She has been a little dizzy with it. "I can write and I don't have the shakes like I did."  She has a little nausea.   I did a lab workup for reversible causes of peripheral neuropathy and found that she had evidence of IgG kappa light chains on her urine immunoelectrophoresis.  Her serum electrophoresis also revealed monoclonal protein.  01/16/16 update:  The patient is seen today in follow-up.  I have reviewed records since our last visit.  She is on primidone, 50 mg for essential  tremor.  She thinks that it is usually very helpful and occasionally she will note trouble writing.  She called me at the end of April and stated that she had developed a rash at her waistline and she thought it was from the primidone.  I had doubted that and asked her to follow-up with her primary care physician, which she did.  I got a note from her primary care physician that she was seen on April 27 and told that the rash was from shingles and not from primidone and that her symptoms were mild.  She was started on zara lotion.  She is doing better from that regard.  Last visit, I talked her about the fact that she had an M spike and ended up talking to Dr. Alvy Bimler, who she sees for follicular lymphoma.  Dr. Alvy Bimler thought that this was unrelated to the lymphoma, but said that she would like the patient follow-up in 6 months instead of one year.  05/20/16 update:  Patient is seen today in follow-up.  Last visit, I increased her primidone to 50 mg - 1/2 to 1 tablet in the AM and 1 tablet at night.  She is taking 1 po bid. The patient states that she is doing better and shaking less.   No  SE and doesn't make her sleepy.   No falls but "I have come close."  I have reviewed records since last visit.  She saw Dr. Alvy Bimler on 05/13/2016.  She just recommend repeat myeloma panel in 6 months.  01/21/17 update:  Patient seen today in follow-up.  She is on primidone, 50 mg twice a day.  She states that she thinks that it is much better compared to prior starting primidone but she wonders if we can increase the dose.  She still has some tremor.    She saw Dr. Alvy Bimler in April and I reviewed those records.  Her MGUS has only progressed slightly.  No treatment has been warranted.  She also has a history of B-cell lymphoma and there has been no evidence of significant disease progression in that regard.  Current/Previously tried tremor medications: on atenolol but for BP  Current medications that may exacerbate tremor:   none  Outside reports reviewed: office notes and referral letter/letters.  Allergies  Allergen Reactions  . Amoxicillin Diarrhea and Nausea And Vomiting  . Aspirin Nausea And Vomiting and Other (See Comments)    Heartburn,  Indigestion.  . Benadryl [Diphenhydramine Hcl] Nausea And Vomiting  . Ibuprofen Nausea And Vomiting and Palpitations  . Sulfa Antibiotics Itching and Nausea And Vomiting  . Sulindac     Ineffective for arthritis  . Penicillins Rash    Outpatient Encounter Prescriptions as of 01/21/2017  Medication Sig  . acetaminophen (TYLENOL) 650 MG CR tablet Take 650 mg by mouth every 8 (eight) hours as needed for pain.   Marland Kitchen atenolol (TENORMIN) 50 MG tablet Take 50 mg by mouth daily.  . Calcium Carbonate-Vitamin D (CALCIUM-D) 600-400 MG-UNIT TABS Take by mouth daily.   . chlordiazePOXIDE (LIBRIUM) 10 MG capsule Take 10 mg by mouth 2 (two) times daily as needed for anxiety.   Marland Kitchen dextromethorphan-guaiFENesin (MUCINEX DM) 30-600 MG per 12 hr tablet Take 1 tablet by mouth daily as needed (sinus).   . fluticasone (FLONASE) 50 MCG/ACT nasal spray Place 2 sprays into the nose daily as needed for allergies.   . hydrochlorothiazide (HYDRODIURIL) 25 MG tablet Take 25 mg by mouth daily.  Marland Kitchen HYDROcodone-acetaminophen (NORCO) 5-325 MG per tablet Take 1 tablet by mouth every 6 (six) hours as needed for moderate pain.   . hyoscyamine (LEVSIN SL) 0.125 MG SL tablet Place 0.125 mg under the tongue every 4 (four) hours as needed for cramping. Reported on 09/25/2015  . meclizine (ANTIVERT) 50 MG tablet Take 0.5 tablets (25 mg total) by mouth 3 (three) times daily as needed.  . Multiple Vitamin (MULTIVITAMIN) capsule Take 1 capsule by mouth daily.  . nabumetone (RELAFEN) 750 MG tablet Take 750 mg by mouth 2 (two) times daily.  . primidone (MYSOLINE) 50 MG tablet take 1 tablet by mouth twice a day  . Propylene Glycol (SYSTANE BALANCE) 0.6 % SOLN Apply 1-2 drops to eye 3 (three) times daily as needed (dry  eyes.).   Marland Kitchen ranitidine (ZANTAC) 300 MG tablet Take 300 mg by mouth at bedtime as needed.   . sertraline (ZOLOFT) 50 MG tablet Take 50 mg by mouth daily.   No facility-administered encounter medications on file as of 01/21/2017.     Past Medical History:  Diagnosis Date  . Allergy   . Cancer ([place])    follicular lymphoma  . COPD (chronic obstructive pulmonary disease) ([place])   . Depression   . DJD (degenerative joint disease)   . Dysuria 09/25/2015  . GERD (  gastroesophageal reflux disease)   . Heart murmur    had echo 20 yr ago-not sure where  . Hx of colonic polyp 10/02/05  . Hypertension   . MGUS (monoclonal gammopathy of unknown significance) 10/24/2015  . MVP (mitral valve prolapse)   . Osteoporosis     Past Surgical History:  Procedure Laterality Date  . APPENDECTOMY    . CATARACT EXTRACTION Bilateral   . COLONOSCOPY    . DILATION AND CURETTAGE OF UTERUS     several  . OVARIAN CYST REMOVAL      Social History   Social History  . Marital status: Legally Separated    Spouse name: N/A  . Number of children: N/A  . Years of education: N/A   Occupational History  . Not on file.   Social History Main Topics  . Smoking status: Never Smoker  . Smokeless tobacco: Never Used  . Alcohol use No  . Drug use: No  . Sexual activity: Not on file   Other Topics Concern  . Not on file   Social History Narrative  . No narrative on file    Family Status  Relation Status  . Mother Deceased at age 58       strokes, tremor  . Father Deceased at age 48       strokes  . Brother Deceased       prostate cancer  . Sister Deceased       fall/head trauma/stroke  . Sister Deceased       lung cancer  . Daughter Alive       healthy, thyroid disease    Review of Systems Some indigestion with certain foods.  A complete 10 system ROS was obtained and was negative apart from what is mentioned.   Objective:   VITALS:   Vitals:   01/21/17 1104  BP: 136/68  Pulse: (!) 58    SpO2: 95%  Weight: 119 lb (54 kg)  Height: 5\' 4"  (1.626 m)   Gen:  Appears stated age and in NAD. HEENT:  Normocephalic, atraumatic. The mucous membranes are moist. The superficial temporal arteries are without ropiness or tenderness. Cardiovascular: Bradycardic.  Regular rhythm. Lungs: Clear to auscultation bilaterally. Neck: There are no carotid bruits noted bilaterally.  NEUROLOGICAL:  Orientation:  The patient is alert and oriented x 3.   Cranial nerves: There is good facial symmetry. Speech is fluent and clear. Soft palate rises symmetrically and there is no tongue deviation. Hearing is intact to conversational tone. Tone: Tone is good throughout. Sensation: Sensation is intact to light touch throughout Coordination:  The patient has no dysdiadichokinesia or dysmetria. Motor: Strength is at least antigravity x 4.  She has trouble with shoulder abduction on the left.   Gait and Station: The patient is able to ambulate without difficulty.  Abnormal movements:  There is mild tremor of outstretched hands on the L.  Doesn't change with intention.  R is good today.  Has some trouble with archimedes spirals.     Labs:    Chemistry      Component Value Date/Time   NA 140 11/05/2016 1011   K 4.2 11/05/2016 1011   CL 101 06/07/2014 1539   CL 103 10/09/2012 1036   CO2 28 11/05/2016 1011   BUN 25.4 11/05/2016 1011   CREATININE 1.0 11/05/2016 1011      Component Value Date/Time   CALCIUM 9.4 11/05/2016 1011   ALKPHOS 67 11/05/2016 1011   AST 17 11/05/2016 1011  ALT 8 11/05/2016 1011   BILITOT 0.44 11/05/2016 1011     Lab Results  Component Value Date   TSH 1.50 10/16/2015    Lab Results  Component Value Date   WBC 4.8 11/05/2016   HGB 11.3 (L) 11/05/2016   HCT 33.5 (L) 11/05/2016   MCV 97.0 11/05/2016   PLT 148 11/05/2016      Assessment/Plan:   1.  Essential Tremor.  -wants to increase primidone 50 mg - 2 tablet in the AM and  Continue one tablet at  night. 2.  Peripheral neuropathy  -lab w/u demonstrated kappa light chains.  Does have h/o follicular lymphoma 3.  MGUS  -Dr. Alvy Bimler doesn't think related to follicular lymphoma and following With her 4.  F/u 6-8 months.

## 2017-01-21 ENCOUNTER — Encounter: Payer: Self-pay | Admitting: Neurology

## 2017-01-21 ENCOUNTER — Ambulatory Visit (INDEPENDENT_AMBULATORY_CARE_PROVIDER_SITE_OTHER): Payer: Medicare Other | Admitting: Neurology

## 2017-01-21 VITALS — BP 136/68 | HR 58 | Ht 64.0 in | Wt 119.0 lb

## 2017-01-21 DIAGNOSIS — D472 Monoclonal gammopathy: Secondary | ICD-10-CM | POA: Diagnosis not present

## 2017-01-21 DIAGNOSIS — G250 Essential tremor: Secondary | ICD-10-CM | POA: Diagnosis not present

## 2017-01-21 NOTE — Patient Instructions (Signed)
Continue primidone - 50 mg - but increase morning dose to 2 tablets and will continue 1 tablet at night  Call me when you need a new RX

## 2017-01-23 ENCOUNTER — Other Ambulatory Visit: Payer: Self-pay

## 2017-01-23 ENCOUNTER — Ambulatory Visit (HOSPITAL_COMMUNITY): Payer: Medicare Other | Attending: Cardiology

## 2017-01-23 DIAGNOSIS — I080 Rheumatic disorders of both mitral and aortic valves: Secondary | ICD-10-CM | POA: Diagnosis not present

## 2017-01-23 DIAGNOSIS — I5030 Unspecified diastolic (congestive) heart failure: Secondary | ICD-10-CM | POA: Diagnosis not present

## 2017-01-23 DIAGNOSIS — I420 Dilated cardiomyopathy: Secondary | ICD-10-CM | POA: Insufficient documentation

## 2017-01-23 DIAGNOSIS — R600 Localized edema: Secondary | ICD-10-CM

## 2017-01-27 ENCOUNTER — Other Ambulatory Visit: Payer: Self-pay | Admitting: Neurology

## 2017-01-30 ENCOUNTER — Other Ambulatory Visit: Payer: Self-pay

## 2017-01-30 ENCOUNTER — Emergency Department (HOSPITAL_COMMUNITY): Payer: Medicare Other

## 2017-01-30 ENCOUNTER — Encounter (HOSPITAL_COMMUNITY): Payer: Self-pay | Admitting: Emergency Medicine

## 2017-01-30 ENCOUNTER — Observation Stay (HOSPITAL_COMMUNITY)
Admission: EM | Admit: 2017-01-30 | Discharge: 2017-02-01 | Disposition: A | Discharge status: Home / Self Care | Payer: Medicare Other | Attending: Family Medicine | Admitting: Family Medicine

## 2017-01-30 DIAGNOSIS — G250 Essential tremor: Secondary | ICD-10-CM | POA: Diagnosis present

## 2017-01-30 DIAGNOSIS — I4891 Unspecified atrial fibrillation: Secondary | ICD-10-CM | POA: Diagnosis not present

## 2017-01-30 DIAGNOSIS — K219 Gastro-esophageal reflux disease without esophagitis: Secondary | ICD-10-CM | POA: Diagnosis not present

## 2017-01-30 DIAGNOSIS — J309 Allergic rhinitis, unspecified: Secondary | ICD-10-CM | POA: Diagnosis not present

## 2017-01-30 DIAGNOSIS — R0789 Other chest pain: Principal | ICD-10-CM | POA: Insufficient documentation

## 2017-01-30 DIAGNOSIS — Z9841 Cataract extraction status, right eye: Secondary | ICD-10-CM | POA: Insufficient documentation

## 2017-01-30 DIAGNOSIS — R918 Other nonspecific abnormal finding of lung field: Secondary | ICD-10-CM | POA: Diagnosis not present

## 2017-01-30 DIAGNOSIS — D472 Monoclonal gammopathy: Secondary | ICD-10-CM | POA: Diagnosis not present

## 2017-01-30 DIAGNOSIS — Z79899 Other long term (current) drug therapy: Secondary | ICD-10-CM | POA: Insufficient documentation

## 2017-01-30 DIAGNOSIS — Z9842 Cataract extraction status, left eye: Secondary | ICD-10-CM | POA: Insufficient documentation

## 2017-01-30 DIAGNOSIS — R51 Headache: Secondary | ICD-10-CM | POA: Diagnosis not present

## 2017-01-30 DIAGNOSIS — Z882 Allergy status to sulfonamides status: Secondary | ICD-10-CM | POA: Insufficient documentation

## 2017-01-30 DIAGNOSIS — N183 Chronic kidney disease, stage 3 unspecified: Secondary | ICD-10-CM | POA: Diagnosis present

## 2017-01-30 DIAGNOSIS — R748 Abnormal levels of other serum enzymes: Secondary | ICD-10-CM | POA: Diagnosis not present

## 2017-01-30 DIAGNOSIS — F329 Major depressive disorder, single episode, unspecified: Secondary | ICD-10-CM | POA: Insufficient documentation

## 2017-01-30 DIAGNOSIS — R079 Chest pain, unspecified: Secondary | ICD-10-CM

## 2017-01-30 DIAGNOSIS — I700 Atherosclerosis of aorta: Secondary | ICD-10-CM | POA: Diagnosis not present

## 2017-01-30 DIAGNOSIS — Z7951 Long term (current) use of inhaled steroids: Secondary | ICD-10-CM | POA: Insufficient documentation

## 2017-01-30 DIAGNOSIS — Z9889 Other specified postprocedural states: Secondary | ICD-10-CM | POA: Insufficient documentation

## 2017-01-30 DIAGNOSIS — I10 Essential (primary) hypertension: Secondary | ICD-10-CM | POA: Diagnosis not present

## 2017-01-30 DIAGNOSIS — C8290 Follicular lymphoma, unspecified, unspecified site: Secondary | ICD-10-CM | POA: Diagnosis present

## 2017-01-30 DIAGNOSIS — I5032 Chronic diastolic (congestive) heart failure: Secondary | ICD-10-CM | POA: Diagnosis not present

## 2017-01-30 DIAGNOSIS — I130 Hypertensive heart and chronic kidney disease with heart failure and stage 1 through stage 4 chronic kidney disease, or unspecified chronic kidney disease: Secondary | ICD-10-CM | POA: Diagnosis not present

## 2017-01-30 DIAGNOSIS — I480 Paroxysmal atrial fibrillation: Secondary | ICD-10-CM | POA: Diagnosis not present

## 2017-01-30 DIAGNOSIS — G629 Polyneuropathy, unspecified: Secondary | ICD-10-CM | POA: Diagnosis not present

## 2017-01-30 DIAGNOSIS — M1990 Unspecified osteoarthritis, unspecified site: Secondary | ICD-10-CM | POA: Diagnosis not present

## 2017-01-30 DIAGNOSIS — Z86010 Personal history of colonic polyps: Secondary | ICD-10-CM | POA: Diagnosis not present

## 2017-01-30 DIAGNOSIS — Z9049 Acquired absence of other specified parts of digestive tract: Secondary | ICD-10-CM | POA: Insufficient documentation

## 2017-01-30 DIAGNOSIS — Z880 Allergy status to penicillin: Secondary | ICD-10-CM | POA: Insufficient documentation

## 2017-01-30 DIAGNOSIS — C8280 Other types of follicular lymphoma, unspecified site: Secondary | ICD-10-CM | POA: Diagnosis not present

## 2017-01-30 DIAGNOSIS — R911 Solitary pulmonary nodule: Secondary | ICD-10-CM | POA: Diagnosis present

## 2017-01-30 DIAGNOSIS — R778 Other specified abnormalities of plasma proteins: Secondary | ICD-10-CM

## 2017-01-30 DIAGNOSIS — Z888 Allergy status to other drugs, medicaments and biological substances status: Secondary | ICD-10-CM | POA: Insufficient documentation

## 2017-01-30 DIAGNOSIS — I341 Nonrheumatic mitral (valve) prolapse: Secondary | ICD-10-CM | POA: Insufficient documentation

## 2017-01-30 DIAGNOSIS — R7989 Other specified abnormal findings of blood chemistry: Secondary | ICD-10-CM | POA: Diagnosis not present

## 2017-01-30 DIAGNOSIS — Z886 Allergy status to analgesic agent status: Secondary | ICD-10-CM | POA: Insufficient documentation

## 2017-01-30 DIAGNOSIS — F32A Depression, unspecified: Secondary | ICD-10-CM | POA: Diagnosis present

## 2017-01-30 DIAGNOSIS — J449 Chronic obstructive pulmonary disease, unspecified: Secondary | ICD-10-CM | POA: Diagnosis not present

## 2017-01-30 LAB — BASIC METABOLIC PANEL
ANION GAP: 6 (ref 5–15)
BUN: 17 mg/dL (ref 6–20)
CALCIUM: 9.3 mg/dL (ref 8.9–10.3)
CO2: 30 mmol/L (ref 22–32)
Chloride: 102 mmol/L (ref 101–111)
Creatinine, Ser: 1.05 mg/dL — ABNORMAL HIGH (ref 0.44–1.00)
GFR, EST AFRICAN AMERICAN: 53 mL/min — AB (ref >60)
GFR, EST NON AFRICAN AMERICAN: 46 mL/min — AB (ref >60)
Glucose, Bld: 119 mg/dL — ABNORMAL HIGH (ref 65–99)
POTASSIUM: 3.6 mmol/L (ref 3.5–5.1)
Sodium: 138 mmol/L (ref 135–145)

## 2017-01-30 LAB — HEPARIN LEVEL (UNFRACTIONATED): Heparin Unfractionated: 0.4 IU/mL (ref 0.30–0.70)

## 2017-01-30 LAB — CBC
HEMATOCRIT: 35.9 % — AB (ref 36.0–46.0)
HEMOGLOBIN: 11.7 g/dL — AB (ref 12.0–15.0)
MCH: 31.7 pg (ref 26.0–34.0)
MCHC: 32.6 g/dL (ref 30.0–36.0)
MCV: 97.3 fL (ref 78.0–100.0)
Platelets: 160 10*3/uL (ref 150–400)
RBC: 3.69 MIL/uL — AB (ref 3.87–5.11)
RDW: 12.9 % (ref 11.5–15.5)
WBC: 4.2 10*3/uL (ref 4.0–10.5)

## 2017-01-30 LAB — LIPID PANEL
CHOL/HDL RATIO: 2.8 ratio
CHOLESTEROL: 169 mg/dL (ref 0–200)
HDL: 61 mg/dL (ref >40)
LDL Cholesterol: 86 mg/dL (ref 0–99)
TRIGLYCERIDES: 109 mg/dL (ref <150)
VLDL: 22 mg/dL (ref 0–40)

## 2017-01-30 LAB — PHOSPHORUS: PHOSPHORUS: 3 mg/dL (ref 2.5–4.6)

## 2017-01-30 LAB — TROPONIN I
TROPONIN I: 0.08 ng/mL — AB (ref <0.03)
TROPONIN I: 0.06 ng/mL — AB (ref <0.03)
Troponin I: 0.03 ng/mL (ref <0.03)

## 2017-01-30 LAB — MAGNESIUM: Magnesium: 1.3 mg/dL — ABNORMAL LOW (ref 1.7–2.4)

## 2017-01-30 LAB — I-STAT TROPONIN, ED: Troponin i, poc: 0.01 ng/mL (ref 0.00–0.08)

## 2017-01-30 LAB — MRSA PCR SCREENING: MRSA by PCR: NEGATIVE

## 2017-01-30 LAB — TSH: TSH: 1.299 u[IU]/mL (ref 0.350–4.500)

## 2017-01-30 LAB — T4, FREE: FREE T4: 0.84 ng/dL (ref 0.61–1.12)

## 2017-01-30 MED ORDER — DILTIAZEM HCL 100 MG IV SOLR
5.0000 mg/h | INTRAVENOUS | Status: DC
  Administered 2017-01-30: 5 mg/h via INTRAVENOUS
  Filled 2017-01-30: qty 100

## 2017-01-30 MED ORDER — ACETAMINOPHEN 325 MG PO TABS: 650.0000 mg | ORAL_TABLET | ORAL | Status: DC | PRN

## 2017-01-30 MED ORDER — SODIUM CHLORIDE 0.9 % IV BOLUS (SEPSIS)
500.0000 mL | Freq: Once | INTRAVENOUS | Status: AC
  Administered 2017-01-30: 500 mL via INTRAVENOUS

## 2017-01-30 MED ORDER — HYOSCYAMINE SULFATE 0.125 MG SL SUBL
0.1250 mg | SUBLINGUAL_TABLET | SUBLINGUAL | Status: DC | PRN
  Filled 2017-01-30: qty 1

## 2017-01-30 MED ORDER — PRIMIDONE 50 MG PO TABS
100.0000 mg | ORAL_TABLET | Freq: Every morning | ORAL | Status: DC
  Administered 2017-01-30 – 2017-02-01 (×3): 100 mg via ORAL
  Filled 2017-01-30 (×3): qty 2

## 2017-01-30 MED ORDER — PANTOPRAZOLE SODIUM 40 MG PO TBEC
40.0000 mg | DELAYED_RELEASE_TABLET | Freq: Every day | ORAL | Status: DC
  Administered 2017-01-30 – 2017-02-01 (×3): 40 mg via ORAL
  Filled 2017-01-30 (×3): qty 1

## 2017-01-30 MED ORDER — MAGNESIUM SULFATE 2 GM/50ML IV SOLN
2.0000 g | Freq: Once | INTRAVENOUS | Status: AC
  Administered 2017-01-30: 2 g via INTRAVENOUS
  Filled 2017-01-30: qty 50

## 2017-01-30 MED ORDER — DILTIAZEM HCL 100 MG IV SOLR
5.0000 mg/h | INTRAVENOUS | Status: DC
  Administered 2017-01-30: 5 mg/h via INTRAVENOUS

## 2017-01-30 MED ORDER — HYDROCODONE-ACETAMINOPHEN 5-325 MG PO TABS: 1.0000 | ORAL_TABLET | Freq: Four times a day (QID) | ORAL | Status: DC | PRN

## 2017-01-30 MED ORDER — PRIMIDONE 50 MG PO TABS
50.0000 mg | ORAL_TABLET | Freq: Every day | ORAL | Status: DC
  Administered 2017-01-30 – 2017-01-31 (×2): 50 mg via ORAL
  Filled 2017-01-30 (×2): qty 1

## 2017-01-30 MED ORDER — FAMOTIDINE 20 MG PO TABS
20.0000 mg | ORAL_TABLET | Freq: Every day | ORAL | Status: DC
  Administered 2017-01-30 – 2017-02-01 (×3): 20 mg via ORAL
  Filled 2017-01-30 (×3): qty 1

## 2017-01-30 MED ORDER — DILTIAZEM LOAD VIA INFUSION
10.0000 mg | Freq: Once | INTRAVENOUS | Status: AC
Start: 2017-01-30 — End: 2017-01-30
  Administered 2017-01-30: 10 mg via INTRAVENOUS
  Filled 2017-01-30: qty 10

## 2017-01-30 MED ORDER — HEPARIN BOLUS VIA INFUSION
3000.0000 [IU] | Freq: Once | INTRAVENOUS | Status: AC
  Administered 2017-01-30: 3000 [IU] via INTRAVENOUS
  Filled 2017-01-30: qty 3000

## 2017-01-30 MED ORDER — FLUTICASONE PROPIONATE 50 MCG/ACT NA SUSP: 2.0000 | Freq: Every day | NASAL | Status: DC | PRN

## 2017-01-30 MED ORDER — POTASSIUM CHLORIDE CRYS ER 20 MEQ PO TBCR
40.0000 meq | EXTENDED_RELEASE_TABLET | Freq: Once | ORAL | Status: AC
  Administered 2017-01-30: 40 meq via ORAL
  Filled 2017-01-30: qty 2

## 2017-01-30 MED ORDER — POLYVINYL ALCOHOL 1.4 % OP SOLN: 1.0000 [drp] | Freq: Three times a day (TID) | OPHTHALMIC | Status: DC | PRN

## 2017-01-30 MED ORDER — PRIMIDONE 50 MG PO TABS: 50.0000 mg | ORAL_TABLET | ORAL | Status: DC

## 2017-01-30 MED ORDER — ASPIRIN 81 MG PO CHEW
324.0000 mg | CHEWABLE_TABLET | Freq: Once | ORAL | Status: AC
  Administered 2017-01-30: 324 mg via ORAL
  Filled 2017-01-30: qty 4

## 2017-01-30 MED ORDER — ATENOLOL 50 MG PO TABS: 50.0000 mg | ORAL_TABLET | Freq: Every day | ORAL | Status: DC

## 2017-01-30 MED ORDER — CHLORDIAZEPOXIDE HCL 5 MG PO CAPS
10.0000 mg | ORAL_CAPSULE | Freq: Two times a day (BID) | ORAL | Status: DC | PRN
  Administered 2017-01-30: 10 mg via ORAL
  Filled 2017-01-30: qty 2

## 2017-01-30 MED ORDER — HEPARIN (PORCINE) IN NACL 100-0.45 UNIT/ML-% IJ SOLN
750.0000 [IU]/h | INTRAMUSCULAR | Status: DC
  Administered 2017-01-30: 650 [IU]/h via INTRAVENOUS
  Administered 2017-01-31: 750 [IU]/h via INTRAVENOUS
  Filled 2017-01-30 (×2): qty 250

## 2017-01-30 MED ORDER — ASPIRIN EC 325 MG PO TBEC
325.0000 mg | DELAYED_RELEASE_TABLET | Freq: Every day | ORAL | Status: DC
  Administered 2017-01-31: 325 mg via ORAL
  Filled 2017-01-30: qty 1

## 2017-01-30 MED ORDER — ONDANSETRON HCL 4 MG/2ML IJ SOLN: 4.0000 mg | Freq: Four times a day (QID) | INTRAMUSCULAR | Status: DC | PRN

## 2017-01-30 MED ORDER — SERTRALINE HCL 50 MG PO TABS
50.0000 mg | ORAL_TABLET | Freq: Every day | ORAL | Status: DC
  Administered 2017-01-30 – 2017-02-01 (×3): 50 mg via ORAL
  Filled 2017-01-30 (×3): qty 1

## 2017-01-30 MED ORDER — ADULT MULTIVITAMIN W/MINERALS CH
1.0000 | ORAL_TABLET | Freq: Every day | ORAL | Status: DC
  Administered 2017-01-30 – 2017-02-01 (×3): 1 via ORAL
  Filled 2017-01-30 (×3): qty 1

## 2017-01-30 MED ORDER — MECLIZINE HCL 25 MG PO TABS: 25.0000 mg | ORAL_TABLET | Freq: Three times a day (TID) | ORAL | Status: DC | PRN

## 2017-01-30 MED ORDER — NABUMETONE 750 MG PO TABS
750.0000 mg | ORAL_TABLET | Freq: Two times a day (BID) | ORAL | Status: DC
  Administered 2017-01-30 (×2): 750 mg via ORAL
  Filled 2017-01-30 (×3): qty 1

## 2017-01-30 MED ORDER — AMIODARONE HCL 200 MG PO TABS
200.0000 mg | ORAL_TABLET | Freq: Every day | ORAL | Status: DC
  Administered 2017-01-30 – 2017-02-01 (×3): 200 mg via ORAL
  Filled 2017-01-30 (×3): qty 1

## 2017-01-30 MED ORDER — MORPHINE SULFATE (PF) 4 MG/ML IV SOLN: 1.0000 mg | INTRAVENOUS | Status: DC | PRN

## 2017-01-30 NOTE — ED Provider Notes (Signed)
[facility] DEPT Provider Note   CSN: [id number] Arrival date & time: 01/30/17  0434     History   Chief Complaint Chief Complaint  Patient presents with  . Chest Pain    HPI [name] is a 83 y.o. female.  HPI  83 year old female presents with a chief complaint of chest pain associated with her heart pounding. She states this started around 10 PM last night. Started with heart pounding and feeling like she had heartburn. She has had these sensations before however seemed to last longer tonight. It is both a burning but then also turned into a pressure in her chest. There was radiation into her right neck and her right arm was hurting. She did have some shortness of breath and at one point started having diaphoresis and nausea. She's not sure what may get better but eventually the pain seemed to get better and now is resolved. The heart pounding/palpitations is gone as well. She's had these pounding/palpitation sensations before, most recently about one year ago. She has a history of mitral valve prolapse. She denies ever having a known history of H with fibrillation. She does intermittently get brief palpitations.  Past Medical History:  Diagnosis Date  . Allergy   . Cancer ([place])    follicular lymphoma  . COPD (chronic obstructive pulmonary disease) ([place])   . Depression   . DJD (degenerative joint disease)   . Dysuria 09/25/2015  . GERD (gastroesophageal reflux disease)   . Heart murmur    had echo 20 yr ago-not sure where  . Hx of colonic polyp 10/02/05  . Hypertension   . MGUS (monoclonal gammopathy of unknown significance) 10/24/2015  . MVP (mitral valve prolapse)   . Osteoporosis     Patient Active Problem List   Diagnosis Date Noted  . Hypertension 01/30/2017  . GERD (gastroesophageal reflux disease) 01/30/2017  . Monoclonal gammopathy of unknown significance (MGUS) 01/30/2017  . New onset atrial fibrillation ([place]) 01/30/2017  . Chest pain 01/30/2017  .  Follicular (B cell) lymphoma ([place]) 01/30/2017  . Peripheral neuropathy 01/30/2017  . Essential tremor 01/30/2017  . CKD (chronic kidney disease) stage 3, GFR 30-59 ml/min 01/30/2017  . Depression 01/30/2017  . Atrial fibrillation with RVR ([place]) 01/30/2017  . Full body hives 05/02/2016  . MGUS (monoclonal gammopathy of unknown significance) 10/24/2015  . Dysuria 09/25/2015  . Intermittent vertigo 09/26/2014  . Systolic hypertension 09/32/3557  . History of B-cell lymphoma 12/11/2011  . Lymphadenopathy 10/25/2011    Past Surgical History:  Procedure Laterality Date  . APPENDECTOMY    . CATARACT EXTRACTION Bilateral   . COLONOSCOPY    . DILATION AND CURETTAGE OF UTERUS     several  . OVARIAN CYST REMOVAL      OB History    No data available       Home Medications    Prior to Admission medications   Medication Sig Start Date End Date Taking? Authorizing Provider  acetaminophen (TYLENOL) 650 MG CR tablet Take 650 mg by mouth every 8 (eight) hours as needed for pain.    Yes [provider]  atenolol (TENORMIN) 50 MG tablet Take 50 mg by mouth daily.   Yes [provider]  Calcium Carbonate-Vitamin D (CALCIUM-D) 600-400 MG-UNIT TABS Take by mouth daily.    Yes [provider]  chlordiazePOXIDE (LIBRIUM) 10 MG capsule Take 10 mg by mouth 2 (two) times daily as needed for anxiety.    Yes [provider]  dextromethorphan-guaiFENesin (  MUCINEX DM) 30-600 MG per 12 hr tablet Take 1 tablet by mouth daily as needed (sinus).    Yes [provider]  fluticasone (FLONASE) 50 MCG/ACT nasal spray Place 2 sprays into the nose daily as needed for allergies.    Yes [provider]  hydrochlorothiazide (HYDRODIURIL) 25 MG tablet Take 25 mg by mouth daily.   Yes [provider]  HYDROcodone-acetaminophen (NORCO) 5-325 MG per tablet Take 1 tablet by mouth every 6 (six) hours as needed for moderate pain.    Yes [provider]    hyoscyamine (LEVSIN SL) 0.125 MG SL tablet Place 0.125 mg under the tongue every 4 (four) hours as needed for cramping. Reported on 09/25/2015   Yes [provider]  meclizine (ANTIVERT) 50 MG tablet Take 0.5 tablets (25 mg total) by mouth 3 (three) times daily as needed. 06/07/14  Yes Pamella Pert, MD  Multiple Vitamin (MULTIVITAMIN) capsule Take 1 capsule by mouth daily.   Yes [provider]  nabumetone (RELAFEN) 750 MG tablet Take 750 mg by mouth 2 (two) times daily.   Yes [provider]  primidone (MYSOLINE) 50 MG tablet 2 in the morning, 1 in the evening Patient taking differently: Take 50-100 mg by mouth See admin instructions. Take 2 tablets every morning and take 1 every evening 01/27/17  Yes Tat, Eustace Quail, DO  Propylene Glycol (SYSTANE BALANCE) 0.6 % SOLN Apply 1-2 drops to eye 3 (three) times daily as needed (dry eyes.).    Yes [provider]  ranitidine (ZANTAC) 300 MG tablet Take 300 mg by mouth at bedtime as needed for heartburn.    Yes [provider]  sertraline (ZOLOFT) 50 MG tablet Take 50 mg by mouth daily.   Yes [provider]    Family History No family history on file.  Social History Social History  Substance Use Topics  . Smoking status: Never Smoker  . Smokeless tobacco: Never Used  . Alcohol use No     Allergies   Amoxicillin; Aspirin; Benadryl [diphenhydramine hcl]; Ibuprofen; Sulfa antibiotics; Sulindac; and Penicillins   Review of Systems Review of Systems  Constitutional: Positive for diaphoresis.  Respiratory: Positive for shortness of breath.   Cardiovascular: Positive for chest pain and palpitations.  Gastrointestinal: Positive for nausea. Negative for vomiting.  All other systems reviewed and are negative.    Physical Exam Updated Vital Signs BP (!) 165/103   Pulse (!) 132   Temp 97.7 F (36.5 C) (Oral)   Resp 20   Ht 5\' 4"  (1.626 m)   Wt 54 kg (119 lb 0.8 oz)   SpO2 98%   BMI  20.43 kg/m   Physical Exam  Constitutional: She is oriented to person, place, and time. She appears well-developed and well-nourished. No distress.  HENT:  Head: Normocephalic and atraumatic.  Right Ear: External ear normal.  Left Ear: External ear normal.  Nose: Nose normal.  Eyes: Right eye exhibits no discharge. Left eye exhibits no discharge.  Cardiovascular: Normal rate and normal heart sounds.  An irregular rhythm present.  Pulses:      Radial pulses are 2+ on the right side, and 2+ on the left side.  Pulmonary/Chest: Effort normal and breath sounds normal. She exhibits no tenderness.  Abdominal: Soft. There is no tenderness.  Neurological: She is alert and oriented to person, place, and time.  Skin: Skin is warm and dry. She is not diaphoretic.  Nursing note and vitals reviewed.    ED Treatments /  Results  Labs (all labs ordered are listed, but only abnormal results are displayed) Labs Reviewed  BASIC METABOLIC PANEL - Abnormal; Notable for the following:       Result Value   Glucose, Bld 119 (*)    Creatinine, Ser 1.05 (*)    GFR calc non Af Amer 46 (*)    GFR calc Af Amer 53 (*)    All other components within normal limits  CBC - Abnormal; Notable for the following:    RBC 3.69 (*)    Hemoglobin 11.7 (*)    HCT 35.9 (*)    All other components within normal limits  TROPONIN I  TROPONIN I  TROPONIN I  LIPID PANEL  HEPARIN LEVEL (UNFRACTIONATED)  I-STAT TROPOININ, ED    EKG  EKG Interpretation  Date/Time:  Thursday January 30 2017 04:39:19 EDT Ventricular Rate:  108 PR Interval:    QRS Duration: 76 QT Interval:  306 QTC Calculation: 410 R Axis:   -54 Text Interpretation:  Atrial fibrillation with rapid ventricular response Left axis deviation Nonspecific ST abnormality Abnormal ECG Afib new compared to 2015 Confirmed by Sherwood Gambler [phone]) on 01/30/2017 7:50:02 AM       EKG Interpretation  Date/Time:  Thursday January 30 2017 07:31:08 EDT Ventricular  Rate:  142 PR Interval:    QRS Duration: 79 QT Interval:  313 QTC Calculation: 482 R Axis:   -54 Text Interpretation:  Atrial fibrillation with rapid V-rate LAD, consider left anterior fascicular block Repolarization abnormality, prob rate related Afib now with RVR Confirmed by Sherwood Gambler [phone]) on 01/30/2017 7:50:57 AM        Radiology Dg Chest 2 View  Result Date: 01/30/2017 CLINICAL DATA:  83 year old female with chest pain. EXAM: CHEST  2 VIEW COMPARISON:  Chest radiograph dated 01/08/2017 and chest CT dated 11/12/2012 FINDINGS: There is emphysematous changes of the lungs. No focal consolidation, pleural effusion, or pneumothorax. A 9 mm nodular density in the right upper lung field may be artifactual. CT may provide better evaluation. Top-normal cardiac silhouette. Osteopenia with degenerative changes of the spine and shoulders. No acute osseous pathology. Atherosclerotic calcification of the aorta. IMPRESSION: 1. No acute cardiopulmonary process. 2. Artifact versus a 9 mm right upper lobe nodule. CT may provide better evaluation if clinically indicated. Electronically Signed   By: Anner Crete M.D.   On: 01/30/2017 05:16    Procedures Procedures (including critical care time)  CRITICAL CARE Performed by: Sherwood Gambler T   Total critical care time: 30 minutes  Critical care time was exclusive of separately billable procedures and treating other patients.  Critical care was necessary to treat or prevent imminent or life-threatening deterioration.  Critical care was time spent personally by me on the following activities: development of treatment plan with patient and/or surrogate as well as nursing, discussions with consultants, evaluation of patient's response to treatment, examination of patient, obtaining history from patient or surrogate, ordering and performing treatments and interventions, ordering and review of laboratory studies, ordering and review of  radiographic studies, pulse oximetry and re-evaluation of patient's condition.   Medications Ordered in ED Medications  primidone (MYSOLINE) tablet 50-100 mg (not administered)  sertraline (ZOLOFT) tablet 50 mg (not administered)  famotidine (PEPCID) tablet 20 mg (not administered)  heparin bolus via infusion 3,000 Units (not administered)  heparin ADULT infusion 100 units/mL (25000 units/298mL sodium chloride 0.45%) (not administered)  diltiazem (CARDIZEM) 1 mg/mL load via infusion 10 mg (not administered)    And  diltiazem (  CARDIZEM) 100 mg in dextrose 5 % 100 mL (1 mg/mL) infusion (not administered)  pantoprazole (PROTONIX) EC tablet 40 mg (not administered)  aspirin chewable tablet 324 mg (324 mg Oral Given 01/30/17 0534)  sodium chloride 0.9 % bolus 500 mL (0 mLs Intravenous Stopped 01/30/17 0728)     Initial Impression / Assessment and Plan / ED Course  I have reviewed the triage vital signs and the nursing notes.  Pertinent labs & imaging results that were available during my care of the patient were reviewed by me and considered in my medical decision making (see chart for details).     Patient has had intermittent palpitations for some time, and with MVP, likely has had some degree of undiagnosed afib, thus I don't think cardioverting is ideal for her with unclear time of onset. While she felt the likely RVR tonight, she doesn't know she's currently in afib when HR is 90s-100s, so can't get clear onset. Has some ST depressions without elevations, though no pain. Will need cards workup. While in ED developed afib with RVR, placed on cardizem with good response. Admit to stepdown.  Final Clinical Impressions(s) / ED Diagnoses   Final diagnoses:  Atrial fibrillation with RVR [facility])    New Prescriptions New Prescriptions   No medications on file     Sherwood Gambler, MD 01/30/17 [phone]

## 2017-01-30 NOTE — ED Triage Notes (Signed)
Pt reports that after she woke up and used the bathroom this morning she began to have chest pain that went into her neck.  She reports she became weak and "sweaty".  She stays at this time her chest feels better.

## 2017-01-30 NOTE — Progress Notes (Signed)
Cardiology Consultation:   Patient ID: [name]; [id number]; 11-06-1925   Admit date: 01/30/2017 Date of Consult: 01/30/2017  Primary Care Provider: Gaynelle Arabian, MD Primary Cardiologist: New Primary Electrophysiologist:  n/a   Patient Profile:   [name] is a 83 y.o. female with a hx of B cell lymphoma (Dr Alvy Bimler), HTN, GERD, MVP, DJD, CKD III, essential tremor, peripheral neuropathy, and depression, who is being seen today for the evaluation of atrial fib at the request of Dr Aggie Moats.  History of Present Illness:   [name] was in her USOH yesterday. However, last pm, she was getting into bed when her heart started beating very fast.  She waited to see if it would calm down. It did not, so her husband drove her to the hospital. In the ER, she was started on IV Cardizem at 5 mg/hr. She spontaneously converted to SR. Her HR began dropping into the 50s, so the IV Cardizem was d/c'd.  She was having pain in her R arm, going up to her R ear with the afib. Her chest did not feel right. It felt like her reflux but kept getting worse and burping did not help. She felt "peculiar", thought she was going to vomit. These symptoms resolved by arrival at the ER, but she felt weak.   The weakness finally improved but she normally feel weak when she first gets out of bed. She normally has some swelling in her legs, and says her LE circulation is poor. GI sx are generally controlled.   She has had episodes in the past, never lasting this long or this severe. She feels they are brought on by stress. Last pm, she was worried about things, but there is nothing acutely going on.   Past Medical History:  Diagnosis Date  . Allergy   . Cancer ([place])    follicular lymphoma  . COPD (chronic obstructive pulmonary disease) ([place])   . Depression   . DJD (degenerative joint disease)   . Dysuria 09/25/2015  . GERD (gastroesophageal reflux disease)   . Heart murmur    had echo 20 yr ago-not  sure where  . Hx of colonic polyp 10/02/05  . Hypertension   . MGUS (monoclonal gammopathy of unknown significance) 10/24/2015  . MVP (mitral valve prolapse)   . Osteoporosis     Past Surgical History:  Procedure Laterality Date  . APPENDECTOMY    . CATARACT EXTRACTION Bilateral   . COLONOSCOPY    . DILATION AND CURETTAGE OF UTERUS     several  . OVARIAN CYST REMOVAL       Inpatient Medications: Scheduled Meds: . [START ON 01/31/2017] aspirin EC  325 mg Oral Daily  . famotidine  20 mg Oral Daily  . multivitamin with minerals  1 tablet Oral Daily  . nabumetone  750 mg Oral BID  . pantoprazole  40 mg Oral Daily  . primidone  100 mg Oral q morning - 10a   And  . primidone  50 mg Oral QHS  . sertraline  50 mg Oral Daily   Continuous Infusions: . diltiazem (CARDIZEM) infusion Stopped (01/30/17 1129)  . heparin 650 Units/hr (01/30/17 0803)  . magnesium sulfate 1 - 4 g bolus IVPB 2 g (01/30/17 1148)   PRN Meds: acetaminophen, chlordiazePOXIDE, fluticasone, HYDROcodone-acetaminophen, hyoscyamine, meclizine, morphine injection, ondansetron (ZOFRAN) IV, polyvinyl alcohol Medication Sig  acetaminophen (TYLENOL) 650 MG CR tablet Take 650 mg by mouth every 8 (eight) hours as needed for pain.  atenolol (TENORMIN) 50 MG tablet Take 50 mg by mouth daily.  Calcium Carbonate-Vitamin D (CALCIUM-D) 600-400 MG-UNIT TABS Take by mouth daily.   chlordiazePOXIDE (LIBRIUM) 10 MG capsule Take 10 mg by mouth 2 (two) times daily as needed for anxiety.   dextromethorphan-guaiFENesin (MUCINEX DM) 30-600 MG per 12 hr tablet Take 1 tablet by mouth daily as needed (sinus).   fluticasone (FLONASE) 50 MCG/ACT nasal spray Place 2 sprays into the nose daily as needed for allergies.   hydrochlorothiazide (HYDRODIURIL) 25 MG tablet Take 25 mg by mouth daily.  HYDROcodone-acetaminophen (NORCO) 5-325 MG per tablet Take 1 tablet by mouth every 6 (six) hours as needed for moderate pain.   hyoscyamine (LEVSIN SL)  0.125 MG SL tablet Place 0.125 mg under the tongue every 4 (four) hours as needed for cramping. Reported on 09/25/2015  meclizine (ANTIVERT) 50 MG tablet Take 0.5 tablets (25 mg total) by mouth 3 (three) times daily as needed.  Multiple Vitamin (MULTIVITAMIN) capsule Take 1 capsule by mouth daily.  nabumetone (RELAFEN) 750 MG tablet Take 750 mg by mouth 2 (two) times daily.  primidone (MYSOLINE) 50 MG tablet 2 in the morning, 1 in the evening Patient taking differently: Take 50-100 mg by mouth See admin instructions. Take 2 tablets every morning and take 1 every evening  Propylene Glycol (SYSTANE BALANCE) 0.6 % SOLN Apply 1-2 drops to eye 3 (three) times daily as needed (dry eyes.).   ranitidine (ZANTAC) 300 MG tablet Take 300 mg by mouth at bedtime as needed for heartburn.   sertraline (ZOLOFT) 50 MG tablet Take 50 mg by mouth daily.    Allergies:    Allergies  Allergen Reactions  . Amoxicillin Diarrhea and Nausea And Vomiting  . Aspirin Nausea And Vomiting and Other (See Comments)    Heartburn,  Indigestion.  . Benadryl [Diphenhydramine Hcl] Nausea And Vomiting  . Ibuprofen Nausea And Vomiting and Palpitations  . Sulfa Antibiotics Itching and Nausea And Vomiting  . Sulindac     Ineffective for arthritis  . Penicillins Rash    Social History:   Social History   Social History  . Marital status: Married    Spouse name: N/A  . Number of children: N/A  . Years of education: N/A   Occupational History  . Retired    Social History Main Topics  . Smoking status: Never Smoker  . Smokeless tobacco: Never Used  . Alcohol use No  . Drug use: No  . Sexual activity: Not on file   Other Topics Concern  . Not on file   Social History Narrative   Pt lives with husband who is bipolar and was wounded in [place]. They stay apart much of the time.    Family History:   The patient's family history includes Heart murmur in her mother; Stroke (age of onset: 63) in her father; Stroke (age of  onset: 66) in her mother. Pt indicated that her mother is deceased. She indicated that her father is deceased. She indicated that both of her sisters are deceased. She indicated that her brother is deceased. She indicated that her daughter is alive.   ROS:  Please see the history of present illness.  All other ROS reviewed and negative.      Physical Exam/Data:   Vitals:   01/30/17 0600 01/30/17 0724 01/30/17 0805 01/30/17 0858  BP: (!) 169/95 (!) 165/103 (!) 115/94 (!) 167/76  Pulse: 90 (!) 132 77 63  Resp: (!) 22 20    Temp:  TempSrc:      SpO2: 96% 98%    Weight:  119 lb 0.8 oz (54 kg)    Height:  5\' 4"  (1.626 m)      Intake/Output Summary (Last 24 hours) at 01/30/17 1209 Last data filed at 01/30/17 1057  Gross per 24 hour  Intake           527.06 ml  Output              200 ml  Net           327.06 ml   Filed Weights   01/30/17 0724  Weight: 119 lb 0.8 oz (54 kg)   Body mass index is 20.43 kg/m.  General:  Well nourished, well developed, in no acute distress HEENT: normal Lymph: no adenopathy Neck:  JVD 8 cm Endocrine:  No thryomegaly Vascular: No carotid bruits; FA pulses 2+ bilaterally without bruits  Cardiac:  normal S1, S2; RRR; soft murmur  Lungs:  clear to auscultation bilaterally, no wheezing, rhonchi, few basilar rales  Abd: soft, nontender, no hepatomegaly  Ext: no edema Musculoskeletal:  No deformities, BUE and BLE strength normal and equal Skin: warm and dry  Neuro:  CNs 2-12 intact, no focal abnormalities noted Psych:  Normal affect   EKG:  The EKG was personally reviewed and demonstrates:  at 04:39 am, Atrial fib, RVR w/ HR 109 at 08:35 am, SR, 1st deg AVB, ? Sinus arrhythmia Telemetry:  Telemetry was personally reviewed and demonstrates:  SR now  Relevant CV Studies: ECHO: 01/23/2017 - Left ventricle: The cavity size was normal. Wall thickness was   normal. Systolic function was normal. The estimated ejection   fraction was in the range  of 60% to 65%. Wall motion was normal;   there were no regional wall motion abnormalities. Features are   consistent with a pseudonormal left ventricular filling pattern,   with concomitant abnormal relaxation and increased filling   pressure (grade 2 diastolic dysfunction). - Aortic valve: There was no stenosis. There was trivial   regurgitation. - Mitral valve: There was mild regurgitation. - Left atrium: The atrium was moderately to severely dilated. - Right ventricle: The cavity size was normal. Systolic function   was normal. - Right atrium: The atrium was moderately to severely dilated. - Tricuspid valve: Peak RV-RA gradient (S): 28 mm Hg. - Pulmonary arteries: PA peak pressure: 31 mm Hg (S). - Inferior vena cava: The vessel was normal in size. The   respirophasic diameter changes were in the normal range (>= 50%),   consistent with normal central venous pressure. Impressions: - Normal LV size with EF 60-65%. Moderate diastolic dysfunction.   Normal RV size and systolic function. Moderate to severe biatrial   enlargement. Mild MR.  Laboratory Data:  Chemistry  Recent Labs Lab 01/30/17 0444  NA 138  K 3.6  CL 102  CO2 30  GLUCOSE 119*  BUN 17  CREATININE 1.05*  CALCIUM 9.3  GFRNONAA 46*  GFRAA 53*  ANIONGAP 6    Hematology  Recent Labs Lab 01/30/17 0444  WBC 4.2  RBC 3.69*  HGB 11.7*  HCT 35.9*  MCV 97.3  MCH 31.7  MCHC 32.6  RDW 12.9  PLT 160   Cardiac Enzymes  Recent Labs Lab 01/30/17 0716  TROPONINI 0.03*     Recent Labs Lab 01/30/17 0457  TROPIPOC 0.01     Radiology/Studies:  Dg Chest 2 View Result Date: 01/30/2017 CLINICAL DATA:  83 year old female with chest pain.  EXAM: CHEST  2 VIEW COMPARISON:  Chest radiograph dated 01/08/2017 and chest CT dated 11/12/2012 FINDINGS: There is emphysematous changes of the lungs. No focal consolidation, pleural effusion, or pneumothorax. A 9 mm nodular density in the right upper lung field may be  artifactual. CT may provide better evaluation. Top-normal cardiac silhouette. Osteopenia with degenerative changes of the spine and shoulders. No acute osseous pathology. Atherosclerotic calcification of the aorta. IMPRESSION: 1. No acute cardiopulmonary process. 2. Artifact versus a 9 mm right upper lobe nodule. CT may provide better evaluation if clinically indicated. Electronically Signed   By: Anner Crete M.D.   On: 01/30/2017 05:16    Assessment and Plan:   1. Chest pain:  - happened in the setting of atrial fib, RVR - minimal elevation in troponin seen, continue to follow - echo ordered, recent echo above showed nl EF w/ grade 2 dd and no WMA - if repeat echo unchanged and ez do not elevate significantly, MD advise if further eval needed  2. PAF:  - Pt has had episodes of palpitation in the past, almost certainly PAF also. - Discuss with MD if ok to decrease resting HR into the 50s to get BB or CCB on board. HR not sustained < 55 on Cardizem IV at 5 mg/hr. - MD advise if pt would be a candidate for Flecainide.   3. Anticoagulation - This patients CHA2DS2-VASc Score and unadjusted Ischemic Stroke Rate (% per year) is equal to 4.8 % stroke rate/year from a score of 4 Above score calculated as 1 point each if present [CHF, HTN, DM, Vascular=MI/PAD/Aortic Plaque, Age if 65-74, or Female], 2 points each if present [Age > 75, or Stroke/TIA/TE] - she is a candidate for anticoagulation, but cost is an issue. Not sure how much her copay for Eliquis or Xarelto would be.  4. Reported hx MVP:  - Not seen on recent echo  Otherwise, per IM Principal Problem:   Chest pain Active Problems:   Hypertension   GERD (gastroesophageal reflux disease)   Monoclonal gammopathy of unknown significance (MGUS)   New onset atrial fibrillation ([facility])   Follicular (B cell) lymphoma ([facility])   Peripheral neuropathy   Essential tremor   CKD (chronic kidney disease) stage 3, GFR 30-59 ml/min   Depression    Atrial fibrillation with RVR ([facility])   DJD (degenerative joint disease)   COPD (chronic obstructive pulmonary disease) ([facility])   21mm right ?? Lung nodule seen on imaging study   Augusto Garbe  01/30/2017 12:09 PM

## 2017-01-30 NOTE — Progress Notes (Signed)
[facility] for heparin Indication: chest pain/ACS, new afib with ST changes  Heparin Dosing Weight: 54 kg   Assessment: 68 yof presenting with CP, new afib with ST changes. Pharmacy consulted to dose heparin. Plans noted for apixiban if cath is not needed.  -Initial heparin level is at goal  Goal of Therapy:  Heparin level 0.3-0.7 units/ml Monitor platelets by anticoagulation protocol: Yes   Plan:  No heparin changes needed Daily heparin level/CBC  Hildred Laser, Pharm D 01/30/2017 6:17 PM

## 2017-01-30 NOTE — ED Notes (Signed)
Called lab to inquire about pt CBC. Stated blood was "on the instrument right now."

## 2017-01-30 NOTE — ED Notes (Signed)
pts husband - [phone]

## 2017-01-30 NOTE — Progress Notes (Signed)
[facility] for heparin Indication: chest pain/ACS, new afib with ST changes  Heparin Dosing Weight: 54 kg   Assessment: 70 yof presenting with CP, new afib with ST changes. Pharmacy consulted to dose heparin. Not on anticoagulation PTA. Hg 11.7, plt wnl, troponin neg x 1. No bleeding documented.  Goal of Therapy:  Heparin level 0.3-0.7 units/ml Monitor platelets by anticoagulation protocol: Yes   Plan:  Heparin 3000 unit bolus Start heparin at 650 units/h 8h heparin level Daily heparin level/CBC Monitor s/sx bleeding   [name], PharmD, BCPS Clinical Pharmacist 01/30/2017 7:22 AM

## 2017-01-30 NOTE — Progress Notes (Signed)
Potassium 3.6 at admit. With AF/RVR rec keep K >/= 4.0 so gave K dur 40 meq x 1. Mg low at 1.3 so will give 2 gms IV x 1  Erin Hearing, ANP

## 2017-01-30 NOTE — H&P (Signed)
History and Physical    [name] DZH:[id number] DOB: November 11, 1925 DOA: 01/30/2017   PCP: Gaynelle Arabian, MD   Patient coming from/Resides with: Private residence/husband  Chief Complaint: Chest pain  HPI: [name] is a 83 y.o. female with medical history significant for hypertension, B-cell lymphoma followed by Dr. Alvy Bimler, MGUS, hypertension, GERD, mitral valve prolapse, essential tremor, peripheral neuropathy and DJD on chronic pain medications, depression, and CKD 3. Patient reports that after going to bed last night she felt like she was having epigastric symptoms similar to reflux that persisted for over an hour. Around the same time she began feeling palpitations (" heart beating fast"). Symptoms persisted for at least another hour. Patient's family told her husband is going on. She attempted to get up walk around and felt generally weak, nauseous and clammy w/ diaphoresis. She also began having aching in the upper right arm. EMS was called to the home. By the time she arrived to the ER she was chest pain-free. EKG demonstrated new onset atrial fibrillation with subtle inferior lateral ST segment downsloping.  ED Course:  Vital Signs: BP (!) 165/103   Pulse (!) 132   Temp 97.7 F (36.5 C) (Oral)   Resp 20   Ht 5\' 4"  (1.626 m)   Wt 54 kg (119 lb 0.8 oz)   SpO2 98%   BMI 20.43 kg/m  2 view CXR: Emphysematous changes of the lungs without acute cardiopulmonary process; ?? 9 mm right upper lobe nodule-uncertain if artifact therefore CT recommended to better clarify Lab data: Sodium 138, potassium 3.6, chloride 102, CO2 30, glucose 119, BUN 17, creatinine 1.03, anion gap 6, white count 4200 differential not obtained, hemoglobin 11.7, platelets 160,000, POC troponin 0.01 Medications and treatments: Aspirin 324 mg 1, normal saline bolus 500 mL 1  Review of Systems:  In addition to the HPI above,  No Fever-chills, myalgias or other constitutional symptoms No Headache,  changes with Vision or hearing, new weakness, tingling, numbness in any extremity, dysarthria or word finding difficulty, gait disturbance or imbalance, tremors or seizure activity No problems swallowing food or Liquids, indigestion/reflux, choking or coughing while eating, abdominal pain with or after eating No Cough or Shortness of Breath, orthopnea or DOE No Abdominal pain, emesis, melena,hematochezia, dark tarry stools, constipation No dysuria, malodorous urine, hematuria or flank pain No new skin rashes, lesions, masses or bruises, No new joint pains, aches, swelling or redness No recent unintentional weight gain or loss No polyuria, polydypsia or polyphagia   Past Medical History:  Diagnosis Date  . Allergy   . Cancer ([place])    follicular lymphoma  . COPD (chronic obstructive pulmonary disease) ([place])   . Depression   . DJD (degenerative joint disease)   . Dysuria 09/25/2015  . GERD (gastroesophageal reflux disease)   . Heart murmur    had echo 20 yr ago-not sure where  . Hx of colonic polyp 10/02/05  . Hypertension   . MGUS (monoclonal gammopathy of unknown significance) 10/24/2015  . MVP (mitral valve prolapse)   . Osteoporosis     Past Surgical History:  Procedure Laterality Date  . APPENDECTOMY    . CATARACT EXTRACTION Bilateral   . COLONOSCOPY    . DILATION AND CURETTAGE OF UTERUS     several  . OVARIAN CYST REMOVAL      Social History   Social History  . Marital status: Legally Separated    Spouse name: N/A  . Number of children: N/A  . Years  of education: N/A   Occupational History  . Not on file.   Social History Main Topics  . Smoking status: Never Smoker  . Smokeless tobacco: Never Used  . Alcohol use No  . Drug use: No  . Sexual activity: Not on file   Other Topics Concern  . Not on file   Social History Narrative  . No narrative on file    Mobility: Rarely utilizes a cane Work history: Grew up on a farm   Allergies  Allergen Reactions    . Amoxicillin Diarrhea and Nausea And Vomiting  . Aspirin Nausea And Vomiting and Other (See Comments)    Heartburn,  Indigestion.  . Benadryl [Diphenhydramine Hcl] Nausea And Vomiting  . Ibuprofen Nausea And Vomiting and Palpitations  . Sulfa Antibiotics Itching and Nausea And Vomiting  . Sulindac     Ineffective for arthritis  . Penicillins Rash     Family history reviewed and no known history of cardiovascular disease or arrhythmias; patient reports primary family history positive for cancer  Prior to Admission medications   Medication Sig Start Date End Date Taking? Authorizing Provider  acetaminophen (TYLENOL) 650 MG CR tablet Take 650 mg by mouth every 8 (eight) hours as needed for pain.    Yes [provider]  atenolol (TENORMIN) 50 MG tablet Take 50 mg by mouth daily.   Yes [provider]  Calcium Carbonate-Vitamin D (CALCIUM-D) 600-400 MG-UNIT TABS Take by mouth daily.    Yes [provider]  chlordiazePOXIDE (LIBRIUM) 10 MG capsule Take 10 mg by mouth 2 (two) times daily as needed for anxiety.    Yes [provider]  dextromethorphan-guaiFENesin (MUCINEX DM) 30-600 MG per 12 hr tablet Take 1 tablet by mouth daily as needed (sinus).    Yes [provider]  fluticasone (FLONASE) 50 MCG/ACT nasal spray Place 2 sprays into the nose daily as needed for allergies.    Yes [provider]  hydrochlorothiazide (HYDRODIURIL) 25 MG tablet Take 25 mg by mouth daily.   Yes [provider]  HYDROcodone-acetaminophen (NORCO) 5-325 MG per tablet Take 1 tablet by mouth every 6 (six) hours as needed for moderate pain.    Yes [provider]  hyoscyamine (LEVSIN SL) 0.125 MG SL tablet Place 0.125 mg under the tongue every 4 (four) hours as needed for cramping. Reported on 09/25/2015   Yes [provider]  meclizine (ANTIVERT) 50 MG tablet Take 0.5 tablets (25 mg total) by mouth 3 (three) times daily as needed. 06/07/14   Yes Pamella Pert, MD  Multiple Vitamin (MULTIVITAMIN) capsule Take 1 capsule by mouth daily.   Yes [provider]  nabumetone (RELAFEN) 750 MG tablet Take 750 mg by mouth 2 (two) times daily.   Yes [provider]  primidone (MYSOLINE) 50 MG tablet 2 in the morning, 1 in the evening Patient taking differently: Take 50-100 mg by mouth See admin instructions. Take 2 tablets every morning and take 1 every evening 01/27/17  Yes Tat, Eustace Quail, DO  Propylene Glycol (SYSTANE BALANCE) 0.6 % SOLN Apply 1-2 drops to eye 3 (three) times daily as needed (dry eyes.).    Yes [provider]  ranitidine (ZANTAC) 300 MG tablet Take 300 mg by mouth at bedtime as needed for heartburn.    Yes [provider]  sertraline (ZOLOFT) 50 MG tablet Take 50 mg by mouth daily.   Yes [provider]    Physical Exam: Vitals:   01/30/17 0530 01/30/17  0600 01/30/17 0724 01/30/17 0805  BP: (!) 160/91 (!) 169/95 (!) 165/103 (!) 115/94  Pulse: 94 90 (!) 132 77  Resp:  (!) 22 20   Temp:      TempSrc:      SpO2: 97% 96% 98%   Weight:   54 kg (119 lb 0.8 oz)   Height:   5\' 4"  (1.626 m)       Constitutional: NAD, Mildly anxious, comfortable Eyes: PERRL, lids and conjunctivae normal ENMT: Mucous membranes are moist. Posterior pharynx clear of any exudate or lesions.Normal dentition.  Neck: normal, supple, no masses, no thyromegaly Respiratory: clear to auscultation bilaterally, no wheezing, no crackles. Normal respiratory effort. No accessory muscle use.  Cardiovascular: Irregular rhythm (atrial fibrillation with rapid ventricular response), no murmurs / rubs / gallops. No extremity edema. 2+ pedal pulses. No carotid bruits.  Abdomen: no tenderness, no masses palpated. No hepatosplenomegaly. Bowel sounds positive.  Musculoskeletal: no clubbing / cyanosis. No joint deformity upper and lower extremities. Good ROM, no contractures. Normal muscle tone.  Skin: no rashes,  lesions, ulcers. No induration Neurologic: CN 2-12 grossly intact. Sensation intact, DTR normal. Strength 5/5 x all 4 extremities.  Psychiatric: Normal judgment and insight. Alert and oriented x 3. Normal mood.    Labs on Admission: I have personally reviewed following labs and imaging studies  CBC:  Recent Labs Lab 01/30/17 0444  WBC 4.2  HGB 11.7*  HCT 35.9*  MCV 97.3  PLT 462   Basic Metabolic Panel:  Recent Labs Lab 01/30/17 0444  NA 138  K 3.6  CL 102  CO2 30  GLUCOSE 119*  BUN 17  CREATININE 1.05*  CALCIUM 9.3   GFR: Estimated Creatinine Clearance: 31.6 mL/min (A) (by C-G formula based on SCr of 1.05 mg/dL (H)). Liver Function Tests: No results for input(s): AST, ALT, ALKPHOS, BILITOT, PROT, ALBUMIN in the last 168 hours. No results for input(s): LIPASE, AMYLASE in the last 168 hours. No results for input(s): AMMONIA in the last 168 hours. Coagulation Profile: No results for input(s): INR, PROTIME in the last 168 hours. Cardiac Enzymes: No results for input(s): CKTOTAL, CKMB, CKMBINDEX, TROPONINI in the last 168 hours. BNP (last 3 results) No results for input(s): PROBNP in the last 8760 hours. HbA1C: No results for input(s): HGBA1C in the last 72 hours. CBG: No results for input(s): GLUCAP in the last 168 hours. Lipid Profile: No results for input(s): CHOL, HDL, LDLCALC, TRIG, CHOLHDL, LDLDIRECT in the last 72 hours. Thyroid Function Tests: No results for input(s): TSH, T4TOTAL, FREET4, T3FREE, THYROIDAB in the last 72 hours. Anemia Panel: No results for input(s): VITAMINB12, FOLATE, FERRITIN, TIBC, IRON, RETICCTPCT in the last 72 hours. Urine analysis:    Component Value Date/Time   COLORURINE YELLOW 06/07/2014 1539   APPEARANCEUR CLEAR 06/07/2014 1539   LABSPEC 1.020 09/25/2015 1006   PHURINE 6.0 09/25/2015 1006   PHURINE 7.0 06/07/2014 1539   GLUCOSEU Negative 09/25/2015 1006   HGBUR Moderate 09/25/2015 1006   HGBUR NEGATIVE 06/07/2014 1539    BILIRUBINUR Negative 09/25/2015 1006   KETONESUR Negative 09/25/2015 1006   KETONESUR NEGATIVE 06/07/2014 1539   PROTEINUR 30 09/25/2015 1006   PROTEINUR NEGATIVE 06/07/2014 1539   UROBILINOGEN 0.2 09/25/2015 1006   NITRITE Positive 09/25/2015 1006   NITRITE NEGATIVE 06/07/2014 1539   LEUKOCYTESUR Small 09/25/2015 1006   Sepsis Labs: @LABRCNTIP (procalcitonin:4,lacticidven:4) )No results found for this or any previous visit (from the past 240 hour(s)).   Radiological Exams on Admission: Dg Chest 2  View  Result Date: 01/30/2017 CLINICAL DATA:  83 year old female with chest pain. EXAM: CHEST  2 VIEW COMPARISON:  Chest radiograph dated 01/08/2017 and chest CT dated 11/12/2012 FINDINGS: There is emphysematous changes of the lungs. No focal consolidation, pleural effusion, or pneumothorax. A 9 mm nodular density in the right upper lung field may be artifactual. CT may provide better evaluation. Top-normal cardiac silhouette. Osteopenia with degenerative changes of the spine and shoulders. No acute osseous pathology. Atherosclerotic calcification of the aorta. IMPRESSION: 1. No acute cardiopulmonary process. 2. Artifact versus a 9 mm right upper lobe nodule. CT may provide better evaluation if clinically indicated. Electronically Signed   By: Anner Crete M.D.   On: 01/30/2017 05:16    EKG: (Independently reviewed) #1 Atrial fibrillation with ventricular rate 98 bpm with normal R-wave rotation, borderline voltage criteria for LVH, QTC 465 ms, subtle downsloping ST segment in inferolateral leads more prominent in leads V5 /V6  #2 atrial fibrillation with RVR ventricular rate 142 bpm, QTC 482 ms, more prominent ST segment depression in the inferolateral leads than compared to initial EKG although these ST segment changes could be more influenced by rapid rate there is noted to be ST segment elevation in aVR  Assessment/Plan Principal Problem:   Chest pain -Patient presents after developing  chest pain with typical features in the setting of atrial fibrillation RVR -Currently chest pain-free -Did have subtle inferolateral ST segment changes with ventricular rate <100 bpm which is concerning for underlying ischemia -Cardiology consulted -Heparin IV -Daily aspirin -Have discontinued home beta blocker in favor of calcium channel blocker to treat A. fib with RVR -Echo -Lipid panel  Active Problems:   New onset atrial fibrillation  -Long-standing history of palpitations/question PVCs in setting of mitral valve prolapse now presenting with chest pain/palpitations and new onset atrial fibrillation with episodic RVR -Heart rate has decreased after Cardizem bolus and infusion at 5 mg per hour -Ischemic evaluation as above -TSH and free T4 -Anticoagulation with IV heparin-need to further discuss with patient risks and benefits of Coumadin versus NOAC -CHADVASc=4    Hypertension -Currently controlled on Tenormin at home -Tenormin on hold in favor of Cardizem infusion for now -Hold thiazide diuretic for now    Monoclonal gammopathy of unknown significance (MGUS) -Last evaluated by oncology April 2018 -Noted to have very mild disease progression without life-threatening organ damage therefore no indication to initiate treatment    Essential tremor -Stable with some increase in primidone dosing (as requested by patient) at last neurology visit on 01/21/17    CKD (chronic kidney disease) stage 3, GFR 30-59 ml/min -Stable and at baseline    DJD (degenerative joint disease)/peripheral neuropathy -Continue preadmission home medications -Patient does take Relafen for pain and this may need to be reconsidered in setting of anticoagulation    COPD (chronic obstructive pulmonary disease)  -No formal diagnosis and apparently has not undergone pulmonary function testing -X-ray shows what appears to be emphysematous changes -Patient reports grew up in home with multiple smokers but has  never smoked     57mm right ?? Lung nodule seen on imaging study -Radiologist questions possibility of tiny lung nodule versus artifact -Recommendation is for CT scan to better clarify    GERD (gastroesophageal reflux disease) -Continue Pepcid -Add Protonix -Suspect preadmission reflux symptoms were likely related to A. fib and RVR and possible ischemia    Follicular (B cell) lymphoma  -Also followed by outpatient oncology -Last evaluated April 2018 and noted recurrence of lymphadenopathy on  the right side of her neck with documentation that overall disease burden is small and she is relatively asymptomatic with stable blood work -Recommend he continue observation and follow-up in 3 months and if demonstrates significant disease progression oncology will repeat imaging study and possible biopsy before initiating treatment (previously treated with Rituxan)    Depression -Continue Zoloft and prn Librium      DVT prophylaxis: Heparin Code Status: Full Family Communication: Husband  Disposition Plan: Home Consults called: Cardiology/[place]    Samella Parr ANP-BC [facility] Pager [phone]   If 7PM-7AM, please contact night-coverage [email or web address] Password [email or web address]  01/30/2017, 8:14 AM

## 2017-01-30 NOTE — ED Notes (Signed)
Admitting PA in for pt eval

## 2017-01-31 ENCOUNTER — Observation Stay (HOSPITAL_COMMUNITY): Payer: Medicare Other

## 2017-01-31 ENCOUNTER — Observation Stay (HOSPITAL_BASED_OUTPATIENT_CLINIC_OR_DEPARTMENT_OTHER): Payer: Medicare Other

## 2017-01-31 DIAGNOSIS — J439 Emphysema, unspecified: Secondary | ICD-10-CM | POA: Diagnosis not present

## 2017-01-31 DIAGNOSIS — N183 Chronic kidney disease, stage 3 (moderate): Secondary | ICD-10-CM | POA: Diagnosis not present

## 2017-01-31 DIAGNOSIS — I10 Essential (primary) hypertension: Secondary | ICD-10-CM

## 2017-01-31 DIAGNOSIS — I4891 Unspecified atrial fibrillation: Secondary | ICD-10-CM | POA: Diagnosis not present

## 2017-01-31 DIAGNOSIS — R911 Solitary pulmonary nodule: Secondary | ICD-10-CM | POA: Diagnosis not present

## 2017-01-31 DIAGNOSIS — I200 Unstable angina: Secondary | ICD-10-CM | POA: Diagnosis not present

## 2017-01-31 DIAGNOSIS — D472 Monoclonal gammopathy: Secondary | ICD-10-CM

## 2017-01-31 DIAGNOSIS — G250 Essential tremor: Secondary | ICD-10-CM

## 2017-01-31 DIAGNOSIS — R079 Chest pain, unspecified: Secondary | ICD-10-CM

## 2017-01-31 DIAGNOSIS — I130 Hypertensive heart and chronic kidney disease with heart failure and stage 1 through stage 4 chronic kidney disease, or unspecified chronic kidney disease: Secondary | ICD-10-CM | POA: Diagnosis not present

## 2017-01-31 DIAGNOSIS — R748 Abnormal levels of other serum enzymes: Secondary | ICD-10-CM | POA: Diagnosis not present

## 2017-01-31 DIAGNOSIS — K219 Gastro-esophageal reflux disease without esophagitis: Secondary | ICD-10-CM | POA: Diagnosis not present

## 2017-01-31 DIAGNOSIS — R0789 Other chest pain: Secondary | ICD-10-CM | POA: Diagnosis not present

## 2017-01-31 LAB — PULMONARY FUNCTION TEST
DL/VA % pred: 83 %
DL/VA: 4.01 ml/min/mmHg/L
DLCO UNC: 13.56 ml/min/mmHg
DLCO cor % pred: 61 %
DLCO cor: 14.84 ml/min/mmHg
DLCO unc % pred: 55 %
FEF 25-75 Pre: 0.79 L/sec
FEF2575-%Pred-Pre: 82 %
FEV1-%PRED-PRE: 88 %
FEV1-PRE: 1.44 L
FEV1FVC-%Pred-Pre: 94 %
FEV6-%PRED-PRE: 101 %
FEV6-Pre: 2.11 L
FEV6FVC-%Pred-Pre: 106 %
FVC-%Pred-Pre: 96 %
FVC-PRE: 2.13 L
PRE FEV1/FVC RATIO: 68 %
Pre FEV6/FVC Ratio: 99 %
RV % PRED: 105 %
RV: 2.7 L
TLC % PRED: 93 %
TLC: 4.74 L

## 2017-01-31 LAB — NM MYOCAR MULTI W/SPECT W/WALL MOTION / EF
CSEPEW: 1 METS
CSEPHR: 78 %
CSEPPHR: 103 {beats}/min
Exercise duration (min): 5 min
MPHR: 132 {beats}/min
Rest HR: 66 {beats}/min

## 2017-01-31 LAB — COMPREHENSIVE METABOLIC PANEL
ALBUMIN: 3.4 g/dL — AB (ref 3.5–5.0)
ALT: 10 U/L — AB (ref 14–54)
AST: 19 U/L (ref 15–41)
Alkaline Phosphatase: 60 U/L (ref 38–126)
Anion gap: 5 (ref 5–15)
BUN: 20 mg/dL (ref 6–20)
CHLORIDE: 106 mmol/L (ref 101–111)
CO2: 26 mmol/L (ref 22–32)
CREATININE: 1.06 mg/dL — AB (ref 0.44–1.00)
Calcium: 9.1 mg/dL (ref 8.9–10.3)
GFR calc non Af Amer: 45 mL/min — ABNORMAL LOW (ref >60)
GFR, EST AFRICAN AMERICAN: 53 mL/min — AB (ref >60)
Glucose, Bld: 92 mg/dL (ref 65–99)
Potassium: 4.1 mmol/L (ref 3.5–5.1)
SODIUM: 137 mmol/L (ref 135–145)
Total Bilirubin: 0.7 mg/dL (ref 0.3–1.2)
Total Protein: 6.7 g/dL (ref 6.5–8.1)

## 2017-01-31 LAB — CBC
HCT: 33.7 % — ABNORMAL LOW (ref 36.0–46.0)
HEMOGLOBIN: 10.9 g/dL — AB (ref 12.0–15.0)
MCH: 31.2 pg (ref 26.0–34.0)
MCHC: 32.3 g/dL (ref 30.0–36.0)
MCV: 96.6 fL (ref 78.0–100.0)
Platelets: 143 10*3/uL — ABNORMAL LOW (ref 150–400)
RBC: 3.49 MIL/uL — AB (ref 3.87–5.11)
RDW: 12.9 % (ref 11.5–15.5)
WBC: 3.8 10*3/uL — AB (ref 4.0–10.5)

## 2017-01-31 LAB — MAGNESIUM: MAGNESIUM: 1.9 mg/dL (ref 1.7–2.4)

## 2017-01-31 LAB — HEPARIN LEVEL (UNFRACTIONATED): HEPARIN UNFRACTIONATED: 0.29 [IU]/mL — AB (ref 0.30–0.70)

## 2017-01-31 MED ORDER — LORATADINE 10 MG PO TABS
10.0000 mg | ORAL_TABLET | Freq: Every day | ORAL | Status: DC
Start: 2017-01-31 — End: 2017-02-01
  Administered 2017-01-31 – 2017-02-01 (×2): 10 mg via ORAL
  Filled 2017-01-31 (×2): qty 1

## 2017-01-31 MED ORDER — LOSARTAN POTASSIUM 25 MG PO TABS
25.0000 mg | ORAL_TABLET | Freq: Every day | ORAL | Status: DC
  Administered 2017-01-31 – 2017-02-01 (×2): 25 mg via ORAL
  Filled 2017-01-31 (×2): qty 1

## 2017-01-31 MED ORDER — TECHNETIUM TC 99M TETROFOSMIN IV KIT
10.0000 | PACK | Freq: Once | INTRAVENOUS | Status: AC | PRN
  Administered 2017-01-31: 10 via INTRAVENOUS

## 2017-01-31 MED ORDER — HYDRALAZINE HCL 20 MG/ML IJ SOLN
INTRAMUSCULAR | Status: AC
  Filled 2017-01-31: qty 1

## 2017-01-31 MED ORDER — REGADENOSON 0.4 MG/5ML IV SOLN
INTRAVENOUS | Status: AC
  Filled 2017-01-31: qty 5

## 2017-01-31 MED ORDER — REGADENOSON 0.4 MG/5ML IV SOLN
0.4000 mg | Freq: Once | INTRAVENOUS | Status: AC
  Administered 2017-01-31: 0.4 mg via INTRAVENOUS
  Filled 2017-01-31: qty 5

## 2017-01-31 MED ORDER — NABUMETONE 500 MG PO TABS
750.0000 mg | ORAL_TABLET | Freq: Two times a day (BID) | ORAL | Status: DC
  Administered 2017-01-31 – 2017-02-01 (×3): 750 mg via ORAL
  Filled 2017-01-31 (×3): qty 2

## 2017-01-31 MED ORDER — APIXABAN 2.5 MG PO TABS
2.5000 mg | ORAL_TABLET | Freq: Two times a day (BID) | ORAL | Status: DC
  Administered 2017-01-31 – 2017-02-01 (×2): 2.5 mg via ORAL
  Filled 2017-01-31 (×3): qty 1

## 2017-01-31 MED ORDER — LOPERAMIDE HCL 2 MG PO CAPS
4.0000 mg | ORAL_CAPSULE | Freq: Once | ORAL | Status: AC
  Administered 2017-02-01: 4 mg via ORAL
  Filled 2017-01-31: qty 2

## 2017-01-31 MED ORDER — ATENOLOL 50 MG PO TABS
50.0000 mg | ORAL_TABLET | Freq: Every day | ORAL | Status: DC
  Administered 2017-01-31 – 2017-02-01 (×2): 50 mg via ORAL
  Filled 2017-01-31 (×2): qty 1

## 2017-01-31 MED ORDER — ALBUTEROL SULFATE (2.5 MG/3ML) 0.083% IN NEBU: 2.5000 mg | INHALATION_SOLUTION | Freq: Once | RESPIRATORY_TRACT | Status: DC

## 2017-01-31 MED ORDER — HYDRALAZINE HCL 20 MG/ML IJ SOLN
5.0000 mg | Freq: Once | INTRAMUSCULAR | Status: AC
  Administered 2017-01-31: 5 mg via INTRAVENOUS

## 2017-01-31 MED ORDER — TECHNETIUM TC 99M TETROFOSMIN IV KIT
30.0000 | PACK | Freq: Once | INTRAVENOUS | Status: AC | PRN
  Administered 2017-01-31: 30 via INTRAVENOUS

## 2017-01-31 NOTE — Progress Notes (Signed)
Back from stess test awake and alert.

## 2017-01-31 NOTE — Progress Notes (Addendum)
Benefit check submitted for Eliquis      # 2. S/W [facility] # [phone]    1. ELIQUIS  2.5 MG  BID   COVER- YES  CO-PAY- $ 45.00  TIER- 3 DRUG  PRIOR APPROVAL   PHARMACY : [facility]     Patient provided with Eliquis 30 day card, verbalized understanding for use.

## 2017-01-31 NOTE — Care Management Obs Status (Signed)
[facility] NOTIFICATION   Patient Details  Name: [name] MRN: [id number] Date of Birth: [birth year]   Medicare Observation Status Notification Given:  Yes    Carles Collet, RN 01/31/2017, 4:10 PM

## 2017-01-31 NOTE — Progress Notes (Signed)
   NST completed. Radiologist interpretation pending. GR to read.   [name]

## 2017-01-31 NOTE — Progress Notes (Signed)
Went down for PFT by wheelchair.

## 2017-01-31 NOTE — Progress Notes (Signed)
Complained of sinus pressure, md aware.with order. Continue to monitor.

## 2017-01-31 NOTE — Progress Notes (Addendum)
[facility]  PROGRESS NOTE  [name] ZOX:[id number] DOB: [birth year] DOA: 01/30/2017 PCP: Gaynelle Arabian, MD   Brief HPI:   83 y.o. female with medical history significant for hypertension, B-cell lymphoma followed by Dr. Alvy Bimler, MGUS, hypertension, GERD, mitral valve prolapse, essential tremor, peripheral neuropathy and DJD on chronic pain medications, depression, and CKD 3.Patient reports that after going to bed last night she felt like she was having epigastric symptoms similar to reflux that persisted for over an hour. Around the same time she began feeling palpitations (" heart beating fast"). By the time she arrived to the ER she was chest pain-free. EKG demonstrated new onset atrial fibrillation with subtle inferior lateral ST segment downsloping.    Subjective   Patient seen and examined, seen after nuclear stress test. Complains of headache. Patient also has allergies/sinus issues and wants to back on antiallergic medication.   Assessment/Plan:     1. Paroxysmal atrial fibrillation-heart rate is controlled, initially she was started on Cardizem GTT. Which was discontinued as per cardiology recommendation and started on low-dose Imdur on 200 mg by mouth daily. 2. Chest pain-patient had mild elevation of troponin, started on heparin protocol. Cardiology has seen the patient and underwent nuclear stress test today. 3. Hypertension-blood pressure is not elevated, patient was taking atenolol at home. Will restart atenolol 50 mg daily, patient also has been started on losartan 25 mg by mouth daily. 4. Allergic rhinitis-continue Flonase, will start loratadine 10 mg by mouth daily 5. Essential tremor- continue primidone 6. MGUS-last seen by oncology in April 2018. Noted to have very mild disease progression without life-threatening organ damage therefore no indication to initiate treatment. 7. Chronic kidney disease stage III-stable, and person. 8. DJD/peripheral  neuropathy-stable, continue Relafen for pain 9. 9 millimeter nodule seen on chest x-ray- patient will need CT scan of the chest to further clarify the noted vascular radiologist recommendation. Will await cardiology workup as above. If stable will order CT chest before discharge. 10. Follicular B-cell lymphoma-followed by oncology as outpatient 11. Depression-stable, continue Zoloft, when necessary Librium    DVT prophylaxis: On heparin  Code Status: Full code  Family Communication: No family present at bedside   Disposition Plan: Likely home in 1-2 days.   Consultants:  Cardiology  Procedures:  Nuclear stress test  Continuous infusions . heparin 750 Units/hr (01/31/17 1237)      Antibiotics:   Anti-infectives    None       Objective   Vitals:   01/31/17 1105 01/31/17 1225 01/31/17 1226 01/31/17 1524  BP: (!) 213/84  (!) 157/76 (!) 177/79  Pulse: (!) 101  (!) 109 76  Resp:    19  Temp:      TempSrc:      SpO2:  93% 90% 90%  Weight:      Height:        Intake/Output Summary (Last 24 hours) at 01/31/17 1536 Last data filed at 01/31/17 1500  Gross per 24 hour  Intake              203 ml  Output                0 ml  Net              203 ml   Filed Weights   01/30/17 0724 01/31/17 0300  Weight: 54 kg (119 lb 0.8 oz) 51 kg (112 lb 8 oz)     Physical Examination:   Physical Exam: Eyes: No  icterus, extraocular muscles intact  Mouth: Oral mucosa is moist, no lesions on palate,  Neck: Supple, no deformities, masses, or tenderness Lungs: Normal respiratory effort, bilateral clear to auscultation, no crackles or wheezes.  Heart: Regular rate and rhythm, S1 and S2 normal, no murmurs, rubs auscultated Abdomen: BS normoactive,soft,nondistended,non-tender to palpation,no organomegaly Extremities: No pretibial edema, no erythema, no cyanosis, no clubbing Neuro : Alert and oriented to time, place and person, No focal deficits Skin: No rashes seen on  exam    Data Reviewed: I have personally reviewed following labs and imaging studies  CBG: No results for input(s): GLUCAP in the last 168 hours.  CBC:  Recent Labs Lab 01/30/17 0444 01/31/17 0329  WBC 4.2 3.8*  HGB 11.7* 10.9*  HCT 35.9* 33.7*  MCV 97.3 96.6  PLT 160 143*    Basic Metabolic Panel:  Recent Labs Lab 01/30/17 0444 01/30/17 0922 01/31/17 0329  NA 138  --  137  K 3.6  --  4.1  CL 102  --  106  CO2 30  --  26  GLUCOSE 119*  --  92  BUN 17  --  20  CREATININE 1.05*  --  1.06*  CALCIUM 9.3  --  9.1  MG  --  1.3* 1.9  PHOS  --  3.0  --     Recent Results (from the past 240 hour(s))  MRSA PCR Screening     Status: None   Collection Time: 01/30/17  9:02 AM  Result Value Ref Range Status   MRSA by PCR NEGATIVE NEGATIVE Final    Comment:        The GeneXpert MRSA Assay (FDA approved for NASAL specimens only), is one component of a comprehensive MRSA colonization surveillance program. It is not intended to diagnose MRSA infection nor to guide or monitor treatment for MRSA infections.      Liver Function Tests:  Recent Labs Lab 01/31/17 0329  AST 19  ALT 10*  ALKPHOS 60  BILITOT 0.7  PROT 6.7  ALBUMIN 3.4*   No results for input(s): LIPASE, AMYLASE in the last 168 hours. No results for input(s): AMMONIA in the last 168 hours.  Cardiac Enzymes:  Recent Labs Lab 01/30/17 0716 01/30/17 1229 01/30/17 1907  TROPONINI 0.03* 0.08* 0.06*   BNP (last 3 results) No results for input(s): BNP in the last 8760 hours.  ProBNP (last 3 results) No results for input(s): PROBNP in the last 8760 hours.    Studies: Dg Chest 2 View  Result Date: 01/30/2017 CLINICAL DATA:  83 year old female with chest pain. EXAM: CHEST  2 VIEW COMPARISON:  Chest radiograph dated 01/08/2017 and chest CT dated 11/12/2012 FINDINGS: There is emphysematous changes of the lungs. No focal consolidation, pleural effusion, or pneumothorax. A 9 mm nodular density in  the right upper lung field may be artifactual. CT may provide better evaluation. Top-normal cardiac silhouette. Osteopenia with degenerative changes of the spine and shoulders. No acute osseous pathology. Atherosclerotic calcification of the aorta. IMPRESSION: 1. No acute cardiopulmonary process. 2. Artifact versus a 9 mm right upper lobe nodule. CT may provide better evaluation if clinically indicated. Electronically Signed   By: Anner Crete M.D.   On: 01/30/2017 05:16    Scheduled Meds: . albuterol  2.5 mg Nebulization Once  . amiodarone  200 mg Oral Daily  . aspirin EC  325 mg Oral Daily  . atenolol  50 mg Oral Daily  . famotidine  20 mg Oral Daily  . loratadine  10 mg Oral Daily  . losartan  25 mg Oral Daily  . multivitamin with minerals  1 tablet Oral Daily  . nabumetone  750 mg Oral BID  . pantoprazole  40 mg Oral Daily  . primidone  100 mg Oral q morning - 10a   And  . primidone  50 mg Oral QHS  . regadenoson      . sertraline  50 mg Oral Daily      Time spent: 25 min  [facility] Pager [phone]. If 7PM-7AM, please contact night-coverage at [email or web address], Office  [phone]  password [email or web address]  01/31/2017, 3:36 PM  LOS: 0 days

## 2017-01-31 NOTE — Progress Notes (Signed)
Progress Note  Patient Name: ROBERTTA HALFHILL Date of Encounter: 01/31/2017  Primary Cardiologist: Dr. Radford Pax  Subjective   No complaints this am  Inpatient Medications    Scheduled Meds: . amiodarone  200 mg Oral Daily  . aspirin EC  325 mg Oral Daily  . famotidine  20 mg Oral Daily  . multivitamin with minerals  1 tablet Oral Daily  . nabumetone  750 mg Oral BID  . pantoprazole  40 mg Oral Daily  . primidone  100 mg Oral q morning - 10a   And  . primidone  50 mg Oral QHS  . regadenoson      . sertraline  50 mg Oral Daily   Continuous Infusions: . heparin 750 Units/hr (01/31/17 0504)   PRN Meds: acetaminophen, chlordiazePOXIDE, fluticasone, HYDROcodone-acetaminophen, hyoscyamine, meclizine, morphine injection, ondansetron (ZOFRAN) IV, polyvinyl alcohol   Vital Signs    Vitals:   01/31/17 1049 01/31/17 1056 01/31/17 1102 01/31/17 1105  BP: (!) 210/91 (!) 200/90 (!) 179/82 (!) 213/84  Pulse:   97 (!) 101  Resp:      Temp:      TempSrc:      SpO2:      Weight:      Height:        Intake/Output Summary (Last 24 hours) at 01/31/17 1109 Last data filed at 01/31/17 0700  Gross per 24 hour  Intake           203.62 ml  Output                2 ml  Net           201.62 ml   Filed Weights   01/30/17 0724 01/31/17 0300  Weight: 119 lb 0.8 oz (54 kg) 112 lb 8 oz (51 kg)    Telemetry    NSR - Personally Reviewed  ECG    No new EKG to revie - Personally Reviewed  Physical Exam   GEN: No acute distress.   Neck: No JVD Cardiac: RRR, no murmurs, rubs, or gallops.  Respiratory: Clear to auscultation bilaterally. GI: Soft, nontender, non-distended  MS: No edema; No deformity. Neuro:  Nonfocal  Psych: Normal affect   Labs    Chemistry Recent Labs Lab 01/30/17 0444 01/31/17 0329  NA 138 137  K 3.6 4.1  CL 102 106  CO2 30 26  GLUCOSE 119* 92  BUN 17 20  CREATININE 1.05* 1.06*  CALCIUM 9.3 9.1  PROT  --  6.7  ALBUMIN  --  3.4*  AST  --  19  ALT   --  10*  ALKPHOS  --  60  BILITOT  --  0.7  GFRNONAA 46* 45*  GFRAA 53* 53*  ANIONGAP 6 5     Hematology Recent Labs Lab 01/30/17 0444 01/31/17 0329  WBC 4.2 3.8*  RBC 3.69* 3.49*  HGB 11.7* 10.9*  HCT 35.9* 33.7*  MCV 97.3 96.6  MCH 31.7 31.2  MCHC 32.6 32.3  RDW 12.9 12.9  PLT 160 143*    Cardiac Enzymes Recent Labs Lab 01/30/17 0716 01/30/17 1229 01/30/17 1907  TROPONINI 0.03* 0.08* 0.06*    Recent Labs Lab 01/30/17 0457  TROPIPOC 0.01     BNPNo results for input(s): BNP, PROBNP in the last 168 hours.   DDimer No results for input(s): DDIMER in the last 168 hours.   Radiology    Dg Chest 2 View  Result Date: 01/30/2017 CLINICAL DATA:  83 year old female  with chest pain. EXAM: CHEST  2 VIEW COMPARISON:  Chest radiograph dated 01/08/2017 and chest CT dated 11/12/2012 FINDINGS: There is emphysematous changes of the lungs. No focal consolidation, pleural effusion, or pneumothorax. A 9 mm nodular density in the right upper lung field may be artifactual. CT may provide better evaluation. Top-normal cardiac silhouette. Osteopenia with degenerative changes of the spine and shoulders. No acute osseous pathology. Atherosclerotic calcification of the aorta. IMPRESSION: 1. No acute cardiopulmonary process. 2. Artifact versus a 9 mm right upper lobe nodule. CT may provide better evaluation if clinically indicated. Electronically Signed   By: Anner Crete M.D.   On: 01/30/2017 05:16    Cardiac Studies   Study Conclusions  - Left ventricle: The cavity size was normal. Wall thickness was   normal. Systolic function was normal. The estimated ejection   fraction was in the range of 60% to 65%. Wall motion was normal;   there were no regional wall motion abnormalities. Features are   consistent with a pseudonormal left ventricular filling pattern,   with concomitant abnormal relaxation and increased filling   pressure (grade 2 diastolic dysfunction). - Aortic valve:  There was no stenosis. There was trivial   regurgitation. - Mitral valve: There was mild regurgitation. - Left atrium: The atrium was moderately to severely dilated. - Right ventricle: The cavity size was normal. Systolic function   was normal. - Right atrium: The atrium was moderately to severely dilated. - Tricuspid valve: Peak RV-RA gradient (S): 28 mm Hg. - Pulmonary arteries: PA peak pressure: 31 mm Hg (S). - Inferior vena cava: The vessel was normal in size. The   respirophasic diameter changes were in the normal range (>= 50%),   consistent with normal central venous pressure.  Impressions:  - Normal LV size with EF 60-65%. Moderate diastolic dysfunction.   Normal RV size and systolic function. Moderate to severe biatrial   enlargement. Mild MR.  Patient Profile     83 y.o. female  with a hx of B cell lymphoma (Dr Alvy Bimler), HTN, GERD, MVP, DJD, CKD III, essential tremor, peripheral neuropathy, and depression, who is being seen for the evaluation of atrial fib at the request of Dr Aggie Moats.  Assessment & Plan    1. Chest pain:  - happened in the setting of atrial fib, RVR - minimal elevation in troponin seen with flat trend (0.03>>0.08>>0.06) likely represents demand ischemia but she had marked ST segment depression in the anterior precordial leads during afib and CP. - she is NPO for nuclear stress test today - echo ordered, recent echo above showed nl EF w/ grade 2 dd and no WMA  2. New onset atrial fibrillation with RVR - Pt has had episodes of palpitation in the past, almost certainly PAF also. - continue IV Heparin gtt until it is determined which NOAC her insurance will cover - she is now back in NSR - Amio started low dose for suppression of afib given her severe LAE on recent echo.  HR was in low 60's on Cardizem which was stopped.  Continue Amio 200mg  daily.  - LFTs and TSH are normal.   - needs baseline PFTs with DLCO  3. Anticoagulation - This patients  CHA2DS2-VASc Score and unadjusted Ischemic Stroke Rate (% per year) is equal to 4.8 % stroke rate/year from a score of 4 Above score calculated as 1 point each if present [CHF, HTN, DM, Vascular=MI/PAD/Aortic Plaque, Age if 65-74, or Female], 2 points each if present [Age >  75, or Stroke/TIA/TE] - she is a candidate for anticoagulation, but cost is an issue. Not sure how much her copay for Eliquis or Xarelto would be.  Case management is working on this.  - continue IV heparin until pricing checked on Eliquis and Xarelto and nuclear stress test results back - stop ASA once NOAC started  4. Reported hx MVP:  - Not seen on recent echo  5.  HTN   - BP poorly controlled - she has been getting PRN hydralazine - No BB due to bradycardia - start Losartan 25mg  daily and titrate as needed for HTN - check BMET in am   Signed, Fransico Him, MD  01/31/2017, 11:09 AM

## 2017-01-31 NOTE — Progress Notes (Signed)
Nuclear stress test showed no ischemia and normal LVF.  No further ischemic workup needed.  Will change IV heparin to Eliquis 2.5mg  BID (age > 22 and weigth < 60kg).

## 2017-01-31 NOTE — Progress Notes (Signed)
[place] for heparin Indication: chest pain/ACS, new afib with ST changes  Heparin Dosing Weight: 54 kg  Assessment: 61 yof presenting with CP, new afib with ST changes. Pharmacy consulted to dose heparin.   Patient transitioned over to apixaban low dose this evening. Patient meets criteria for reduced dose with age>80 and wt <60kg. Will check cbc in am.  Goal of Therapy:  Heparin level 0.3-0.7 units/ml Monitor platelets by anticoagulation protocol: Yes   Plan:  Start apixaban 2.5mg  bid tonight CBC in am  Erin Hearing PharmD., BCPS Clinical Pharmacist Pager [phone] 01/31/2017 4:51 PM

## 2017-01-31 NOTE — Progress Notes (Signed)
[place] for heparin Indication: chest pain/ACS, new afib with ST changes  Heparin Dosing Weight: 54 kg  Assessment: 71 yof presenting with CP, new afib with ST changes. Pharmacy consulted to dose heparin. Plans noted for apixiban if cath is not needed. Confirmatory heparin level is borderline low at 0.29.  Goal of Therapy:  Heparin level 0.3-0.7 units/ml Monitor platelets by anticoagulation protocol: Yes   Plan:  Increase heparin gtt to 750 units/hr Monitor daily heparin level, CBC, s/s of bleed  Elenor Quinones, PharmD, [facility] Clinical Pharmacist Pager [phone] 01/31/2017 4:30 AM

## 2017-02-01 DIAGNOSIS — I200 Unstable angina: Secondary | ICD-10-CM | POA: Diagnosis not present

## 2017-02-01 DIAGNOSIS — J439 Emphysema, unspecified: Secondary | ICD-10-CM

## 2017-02-01 DIAGNOSIS — N183 Chronic kidney disease, stage 3 (moderate): Secondary | ICD-10-CM | POA: Diagnosis not present

## 2017-02-01 DIAGNOSIS — I4891 Unspecified atrial fibrillation: Secondary | ICD-10-CM | POA: Diagnosis not present

## 2017-02-01 LAB — CBC
HEMATOCRIT: 33.1 % — AB (ref 36.0–46.0)
Hemoglobin: 10.9 g/dL — ABNORMAL LOW (ref 12.0–15.0)
MCH: 31.2 pg (ref 26.0–34.0)
MCHC: 32.9 g/dL (ref 30.0–36.0)
MCV: 94.8 fL (ref 78.0–100.0)
Platelets: 157 10*3/uL (ref 150–400)
RBC: 3.49 MIL/uL — ABNORMAL LOW (ref 3.87–5.11)
RDW: 12.7 % (ref 11.5–15.5)
WBC: 4.5 10*3/uL (ref 4.0–10.5)

## 2017-02-01 LAB — BASIC METABOLIC PANEL
Anion gap: 7 (ref 5–15)
BUN: 20 mg/dL (ref 6–20)
CALCIUM: 9.1 mg/dL (ref 8.9–10.3)
CO2: 25 mmol/L (ref 22–32)
Chloride: 102 mmol/L (ref 101–111)
Creatinine, Ser: 1.12 mg/dL — ABNORMAL HIGH (ref 0.44–1.00)
GFR calc Af Amer: 49 mL/min — ABNORMAL LOW (ref >60)
GFR, EST NON AFRICAN AMERICAN: 43 mL/min — AB (ref >60)
GLUCOSE: 88 mg/dL (ref 65–99)
Potassium: 3.7 mmol/L (ref 3.5–5.1)
Sodium: 134 mmol/L — ABNORMAL LOW (ref 135–145)

## 2017-02-01 MED ORDER — LOSARTAN POTASSIUM 50 MG PO TABS: 50.0000 mg | ORAL_TABLET | Freq: Every day | ORAL | 2 refills | Status: DC

## 2017-02-01 MED ORDER — HYDROCHLOROTHIAZIDE 12.5 MG PO CAPS
12.5000 mg | ORAL_CAPSULE | Freq: Every day | ORAL | Status: DC
  Administered 2017-02-01: 12.5 mg via ORAL
  Filled 2017-02-01: qty 1

## 2017-02-01 MED ORDER — ALBUTEROL SULFATE HFA 108 (90 BASE) MCG/ACT IN AERS: 2.0000 | INHALATION_SPRAY | Freq: Four times a day (QID) | RESPIRATORY_TRACT | 2 refills | Status: DC | PRN

## 2017-02-01 MED ORDER — APIXABAN 2.5 MG PO TABS: 2.5000 mg | ORAL_TABLET | Freq: Two times a day (BID) | ORAL | 2 refills | Status: AC

## 2017-02-01 MED ORDER — AMIODARONE HCL 200 MG PO TABS: 200.0000 mg | ORAL_TABLET | Freq: Every day | ORAL | 2 refills | Status: DC

## 2017-02-01 MED ORDER — HYDROCHLOROTHIAZIDE 12.5 MG PO CAPS: 12.5000 mg | ORAL_CAPSULE | Freq: Every day | ORAL | 2 refills | Status: DC

## 2017-02-01 MED ORDER — LOSARTAN POTASSIUM 25 MG PO TABS
25.0000 mg | ORAL_TABLET | Freq: Every day | ORAL | Status: AC
  Administered 2017-02-01: 25 mg via ORAL
  Filled 2017-02-01: qty 1

## 2017-02-01 MED ORDER — HYDRALAZINE HCL 20 MG/ML IJ SOLN
10.0000 mg | Freq: Once | INTRAMUSCULAR | Status: AC
  Administered 2017-02-01: 10 mg via INTRAVENOUS
  Filled 2017-02-01: qty 1

## 2017-02-01 MED ORDER — LOSARTAN POTASSIUM 50 MG PO TABS: 50.0000 mg | ORAL_TABLET | Freq: Every day | ORAL | Status: DC

## 2017-02-01 NOTE — Discharge Summary (Addendum)
Physician Discharge Summary  [name] AYT:[id number] DOB: 09/25/1925 DOA: 01/30/2017  PCP: Gaynelle Arabian, MD  Admit date: 01/30/2017 Discharge date: 02/01/2017  Time spent: 35 * minutes  Recommendations for Outpatient Follow-up:  1. Follow up PCP in 2 weeks 2. Follow up Dr Alvy Bimler in one week 3. Will need repeat CXR vs CT chest  as outpatient to follow up on ? Pulmonary nodule  Discharge Diagnoses:  Principal Problem:   Chest pain Active Problems:   Hypertension   GERD (gastroesophageal reflux disease)   Monoclonal gammopathy of unknown significance (MGUS)   New onset atrial fibrillation (HCC)   Follicular (B cell) lymphoma (HCC)   Peripheral neuropathy   Essential tremor   CKD (chronic kidney disease) stage 3, GFR 30-59 ml/min   Depression   Atrial fibrillation with RVR (HCC)   DJD (degenerative joint disease)   COPD (chronic obstructive pulmonary disease) (HCC)   79mm right ?? Lung nodule seen on imaging study   Hypomagnesemia   Elevated troponin   Discharge Condition: Stable  Diet recommendation: Heart healthy diet  Filed Weights   01/30/17 0724 01/31/17 0300 02/01/17 0434  Weight: 54 kg (119 lb 0.8 oz) 51 kg (112 lb 8 oz) 51.5 kg (113 lb 9.6 oz)    History of present illness:  83 y.o.femalewith medical history significant for hypertension, B-cell lymphoma followed by Dr. Alvy Bimler, MGUS,hypertension, GERD, mitral valve prolapse, essential tremor, peripheral neuropathy and DJD on chronic pain medications,depression, and CKD 3.Patient reports that after going to bed last night she felt like she was having epigastric symptoms similar to reflux that persisted for over an hour. Around the same time she began feeling palpitations ("heart beating fast").  Hospital Course:  1. Paroxysmal atrial fibrillation-heart rate is controlled, initially she was started on Cardizem GTT. Which was discontinued as per cardiology recommendation and started on low-dose amiodarone  200 mg by mouth daily. Patient started on anticoagulation Eliquis 250 mg po bid 2. Chest pain-patient had mild elevation of troponin, started on heparin protocol. Cardiology has seen the patient and underwent nuclear stress test yesterday, which did not show reversible ischemia. No further cardiac testing recommended. 3. Hypertension-blood pressure is elevated, Losartan dose increased to 50 mg daily per cardiology, will cut down the dose of HCTZ to 12.5 mg po daily, continue Atenolol 50 mg po daily. 4. Allergic rhinitis-continue Flonase 5. Essential tremor- continue primidone 6. MGUS-last seen by oncology in April 2018. Noted to have very mild disease progression without life-threatening organ damage therefore no indication to initiate treatment. 7. Chronic kidney disease stage III-stable, and person. 8. DJD/peripheral neuropathy-stable, continue Relafen for pain 9. 9 millimeter nodule seen on chest x-ray- patient will need CT scan of the chest to further clarify the noted vascular radiologist recommendation. Discussed with patient , recommend getting another CXR in 2 weeks as outpatient and discuss with her Oncologist. Patient is not interested in aggressive work up . 10. Follicular B-cell lymphoma-followed by oncology as outpatient 11. Depression-stable, continue Zoloft, when necessary Librium 12. Mild COPD- PFT's obtained during this admission showed mild COPD,( Discussed the results with Pulmonologist on call at [facility] hospital) will start Albuterol inh 2 puffs q 4 hr prn.  Procedures: Leane Call   Consultations:  Cardiology   Discharge Exam: Vitals:   02/01/17 0640 02/01/17 0805  BP: (!) 194/72 (!) 156/66  Pulse: (!) 53 65  Resp: 18 15  Temp:  98.1 F (36.7 C)    General: Appears in no acute distress Cardiovascular: S1S2  RRR Respiratory: Clear bilaterally  Discharge Instructions   Discharge Instructions    Diet - low sodium heart healthy    Complete by:  As directed     Increase activity slowly    Complete by:  As directed      Current Discharge Medication List    START taking these medications   Details  albuterol (PROVENTIL HFA;VENTOLIN HFA) 108 (90 Base) MCG/ACT inhaler Inhale 2 puffs into the lungs every 6 (six) hours as needed for wheezing or shortness of breath. Qty: 1 Inhaler, Refills: 2    amiodarone (PACERONE) 200 MG tablet Take 1 tablet (200 mg total) by mouth daily. Qty: 30 tablet, Refills: 2    apixaban (ELIQUIS) 2.5 MG TABS tablet Take 1 tablet (2.5 mg total) by mouth 2 (two) times daily. Qty: 60 tablet, Refills: 2    hydrochlorothiazide (MICROZIDE) 12.5 MG capsule Take 1 capsule (12.5 mg total) by mouth daily. Qty: 30 capsule, Refills: 2    losartan (COZAAR) 50 MG tablet Take 1 tablet (50 mg total) by mouth daily. Qty: 30 tablet, Refills: 2      CONTINUE these medications which have NOT CHANGED   Details  atenolol (TENORMIN) 50 MG tablet Take 50 mg by mouth daily.    Calcium Carbonate-Vitamin D (CALCIUM-D) 600-400 MG-UNIT TABS Take by mouth daily.     chlordiazePOXIDE (LIBRIUM) 10 MG capsule Take 10 mg by mouth 2 (two) times daily as needed for anxiety.     dextromethorphan-guaiFENesin (MUCINEX DM) 30-600 MG per 12 hr tablet Take 1 tablet by mouth daily as needed (sinus).     fluticasone (FLONASE) 50 MCG/ACT nasal spray Place 2 sprays into the nose daily as needed for allergies.     HYDROcodone-acetaminophen (NORCO) 5-325 MG per tablet Take 1 tablet by mouth every 6 (six) hours as needed for moderate pain.     hyoscyamine (LEVSIN SL) 0.125 MG SL tablet Place 0.125 mg under the tongue every 4 (four) hours as needed for cramping. Reported on 09/25/2015    meclizine (ANTIVERT) 50 MG tablet Take 0.5 tablets (25 mg total) by mouth 3 (three) times daily as needed. Qty: 30 tablet, Refills: 0    Multiple Vitamin (MULTIVITAMIN) capsule Take 1 capsule by mouth daily.    nabumetone (RELAFEN) 750 MG tablet Take 750 mg by mouth 2 (two)  times daily.    primidone (MYSOLINE) 50 MG tablet 2 in the morning, 1 in the evening Qty: 90 tablet, Refills: 5    Propylene Glycol (SYSTANE BALANCE) 0.6 % SOLN Apply 1-2 drops to eye 3 (three) times daily as needed (dry eyes.).    Associated Diagnoses: Follicular lymphoma (HCC)    ranitidine (ZANTAC) 300 MG tablet Take 300 mg by mouth at bedtime as needed for heartburn.     sertraline (ZOLOFT) 50 MG tablet Take 50 mg by mouth daily.      STOP taking these medications     acetaminophen (TYLENOL) 650 MG CR tablet      hydrochlorothiazide (HYDRODIURIL) 25 MG tablet        Allergies  Allergen Reactions  . Amoxicillin Diarrhea and Nausea And Vomiting  . Aspirin Nausea And Vomiting and Other (See Comments)    Heartburn,  Indigestion.  . Benadryl [Diphenhydramine Hcl] Nausea And Vomiting  . Ibuprofen Nausea And Vomiting and Palpitations  . Sulfa Antibiotics Itching and Nausea And Vomiting  . Sulindac     Ineffective for arthritis  . Penicillins Rash      The results of  significant diagnostics from this hospitalization (including imaging, microbiology, ancillary and laboratory) are listed below for reference.    Significant Diagnostic Studies: Dg Chest 2 View  Result Date: 01/30/2017 CLINICAL DATA:  83 year old female with chest pain. EXAM: CHEST  2 VIEW COMPARISON:  Chest radiograph dated 01/08/2017 and chest CT dated 11/12/2012 FINDINGS: There is emphysematous changes of the lungs. No focal consolidation, pleural effusion, or pneumothorax. A 9 mm nodular density in the right upper lung field may be artifactual. CT may provide better evaluation. Top-normal cardiac silhouette. Osteopenia with degenerative changes of the spine and shoulders. No acute osseous pathology. Atherosclerotic calcification of the aorta. IMPRESSION: 1. No acute cardiopulmonary process. 2. Artifact versus a 9 mm right upper lobe nodule. CT may provide better evaluation if clinically indicated. Electronically  Signed   By: Anner Crete M.D.   On: 01/30/2017 05:16   Dg Chest 2 View  Result Date: 01/08/2017 CLINICAL DATA:  Shortness of breath, swelling in legs. EXAM: CHEST  2 VIEW COMPARISON:  04/05/2015 FINDINGS: Cardiomegaly. There is hyperinflation of the lungs compatible with COPD. No confluent opacities or effusions. No acute bony abnormality. IMPRESSION: COPD, cardiomegaly.  No active disease. Electronically Signed   By: Rolm Baptise M.D.   On: 01/08/2017 16:34   Nm Myocar Multi W/spect W/wall Motion / Ef  Result Date: 01/31/2017 CLINICAL DATA:  Chest pain, hypertension EXAM: MYOCARDIAL IMAGING WITH SPECT (REST AND PHARMACOLOGIC-STRESS) GATED LEFT VENTRICULAR WALL MOTION STUDY LEFT VENTRICULAR EJECTION FRACTION TECHNIQUE: Standard myocardial SPECT imaging was performed after resting intravenous injection of 10 mCi Tc-14m tetrofosmin. Subsequently, intravenous infusion of Lexiscan was performed under the supervision of the Cardiology staff. At peak effect of the drug, 30 mCi Tc-13m tetrofosmin was injected intravenously and standard myocardial SPECT imaging was performed. Quantitative gated imaging was also performed to evaluate left ventricular wall motion, and estimate left ventricular ejection fraction. COMPARISON:  None. FINDINGS: Perfusion: Fixed anteroapical and inferolateral defects on both resting and stress components. No suggestion of ischemia. Wall Motion: Normal left ventricular wall motion. No left ventricular dilation. Left Ventricular Ejection Fraction: 66 % End diastolic volume 69 ml End systolic volume 24 ml IMPRESSION: 1. No reversible ischemia. Fixed anteroapical and inferolateral defects. 2. Normal left ventricular wall motion. 3. Left ventricular ejection fraction 66% 4. Non invasive risk stratification*: Low *2012 Appropriate Use Criteria for Coronary Revascularization Focused Update: J Am Coll Cardiol. 0370;48(8):891-694. http://content.airportbarriers.com.aspx?articleid=1201161  Electronically Signed   By: Lucrezia Europe M.D.   On: 01/31/2017 15:56    Microbiology: Recent Results (from the past 240 hour(s))  MRSA PCR Screening     Status: None   Collection Time: 01/30/17  9:02 AM  Result Value Ref Range Status   MRSA by PCR NEGATIVE NEGATIVE Final    Comment:        The GeneXpert MRSA Assay (FDA approved for NASAL specimens only), is one component of a comprehensive MRSA colonization surveillance program. It is not intended to diagnose MRSA infection nor to guide or monitor treatment for MRSA infections.      Labs: Basic Metabolic Panel:  Recent Labs Lab 01/30/17 0444 01/30/17 0922 01/31/17 0329 02/01/17 0337  NA 138  --  137 134*  K 3.6  --  4.1 3.7  CL 102  --  106 102  CO2 30  --  26 25  GLUCOSE 119*  --  92 88  BUN 17  --  20 20  CREATININE 1.05*  --  1.06* 1.12*  CALCIUM 9.3  --  9.1 9.1  MG  --  1.3* 1.9  --   PHOS  --  3.0  --   --    Liver Function Tests:  Recent Labs Lab 01/31/17 0329  AST 19  ALT 10*  ALKPHOS 60  BILITOT 0.7  PROT 6.7  ALBUMIN 3.4*   No results for input(s): LIPASE, AMYLASE in the last 168 hours. No results for input(s): AMMONIA in the last 168 hours. CBC:  Recent Labs Lab 01/30/17 0444 01/31/17 0329 02/01/17 0337  WBC 4.2 3.8* 4.5  HGB 11.7* 10.9* 10.9*  HCT 35.9* 33.7* 33.1*  MCV 97.3 96.6 94.8  PLT 160 143* 157   Cardiac Enzymes:  Recent Labs Lab 01/30/17 0716 01/30/17 1229 01/30/17 1907  TROPONINI 0.03* 0.08* 0.06*   BNP:     Signed:  Oswald Hillock MD.  [facility] 02/01/2017, 11:49 AM

## 2017-02-01 NOTE — Progress Notes (Signed)
Progress Note  Patient Name: [name] Date of Encounter: 02/01/2017  Primary Cardiologist: Dr. Radford Pax  Subjective   Feeling well.  Denies chest pain, shortness of breath or palpitations.   Inpatient Medications    Scheduled Meds: . albuterol  2.5 mg Nebulization Once  . amiodarone  200 mg Oral Daily  . apixaban  2.5 mg Oral BID  . atenolol  50 mg Oral Daily  . famotidine  20 mg Oral Daily  . hydrochlorothiazide  12.5 mg Oral Daily  . loratadine  10 mg Oral Daily  . losartan  25 mg Oral Daily  . multivitamin with minerals  1 tablet Oral Daily  . nabumetone  750 mg Oral BID  . pantoprazole  40 mg Oral Daily  . primidone  100 mg Oral q morning - 10a   And  . primidone  50 mg Oral QHS  . sertraline  50 mg Oral Daily   Continuous Infusions:  PRN Meds: acetaminophen, chlordiazePOXIDE, fluticasone, HYDROcodone-acetaminophen, hyoscyamine, meclizine, morphine injection, ondansetron (ZOFRAN) IV, polyvinyl alcohol   Vital Signs    Vitals:   02/01/17 0433 02/01/17 0434 02/01/17 0640 02/01/17 0805  BP:  (!) 180/77 (!) 194/72 (!) 156/66  Pulse: 63  (!) 53 65  Resp: 15  18 15   Temp: (!) 97.4 F (36.3 C)   98.1 F (36.7 C)  TempSrc: Oral   Oral  SpO2: 96%  93%   Weight:  51.5 kg (113 lb 9.6 oz)    Height:        Intake/Output Summary (Last 24 hours) at 02/01/17 1044 Last data filed at 02/01/17 0951  Gross per 24 hour  Intake            372.5 ml  Output                0 ml  Net            372.5 ml   Filed Weights   01/30/17 0724 01/31/17 0300 02/01/17 0434  Weight: 54 kg (119 lb 0.8 oz) 51 kg (112 lb 8 oz) 51.5 kg (113 lb 9.6 oz)    Telemetry    Sinus rhythm.  No events - Personally Reviewed  ECG    n/a - Personally Reviewed  Physical Exam   VS:  BP (!) 156/66 (BP Location: Right Arm)   Pulse 65   Temp 98.1 F (36.7 C) (Oral)   Resp 15   Ht 5\' 4"  (1.626 m)   Wt 51.5 kg (113 lb 9.6 oz)   SpO2 93%   BMI 19.50 kg/m  , BMI Body mass index is 19.5  kg/m. GENERAL:  Well appearing.  No acute distress HEENT: Pupils equal round and reactive, fundi not visualized, oral mucosa unremarkable NECK:  No jugular venous distention, waveform within normal limits, carotid upstroke brisk and symmetric, no bruits, no thyromegaly LYMPHATICS:  No cervical adenopathy LUNGS:  Clear to auscultation bilaterally HEART:  RRR.  PMI not displaced or sustained,S1 and S2 within normal limits, no S3, no S4, no clicks, no rubs, no murmurs ABD:  Flat, positive bowel sounds normal in frequency in pitch, no bruits, no rebound, no guarding, no midline pulsatile mass, no hepatomegaly, no splenomegaly EXT:  2 plus pulses throughout, no edema, no cyanosis no clubbing SKIN:  No rashes no nodules NEURO:  Cranial nerves II through XII grossly intact, motor grossly intact throughout PSYCH:  Cognitively intact, oriented to person place and time   Labs  Chemistry Recent Labs Lab 01/30/17 0444 01/31/17 0329 02/01/17 0337  NA 138 137 134*  K 3.6 4.1 3.7  CL 102 106 102  CO2 30 26 25   GLUCOSE 119* 92 88  BUN 17 20 20   CREATININE 1.05* 1.06* 1.12*  CALCIUM 9.3 9.1 9.1  PROT  --  6.7  --   ALBUMIN  --  3.4*  --   AST  --  19  --   ALT  --  10*  --   ALKPHOS  --  60  --   BILITOT  --  0.7  --   GFRNONAA 46* 45* 43*  GFRAA 53* 53* 49*  ANIONGAP 6 5 7      Hematology Recent Labs Lab 01/30/17 0444 01/31/17 0329 02/01/17 0337  WBC 4.2 3.8* 4.5  RBC 3.69* 3.49* 3.49*  HGB 11.7* 10.9* 10.9*  HCT 35.9* 33.7* 33.1*  MCV 97.3 96.6 94.8  MCH 31.7 31.2 31.2  MCHC 32.6 32.3 32.9  RDW 12.9 12.9 12.7  PLT 160 143* 157    Cardiac Enzymes Recent Labs Lab 01/30/17 0716 01/30/17 1229 01/30/17 1907  TROPONINI 0.03* 0.08* 0.06*    Recent Labs Lab 01/30/17 0457  TROPIPOC 0.01     BNPNo results for input(s): BNP, PROBNP in the last 168 hours.   DDimer No results for input(s): DDIMER in the last 168 hours.   Radiology    Nm Myocar Multi W/spect W/wall  Motion / Ef  Result Date: 01/31/2017 CLINICAL DATA:  Chest pain, hypertension EXAM: MYOCARDIAL IMAGING WITH SPECT (REST AND PHARMACOLOGIC-STRESS) GATED LEFT VENTRICULAR WALL MOTION STUDY LEFT VENTRICULAR EJECTION FRACTION TECHNIQUE: Standard myocardial SPECT imaging was performed after resting intravenous injection of 10 mCi Tc-38m tetrofosmin. Subsequently, intravenous infusion of Lexiscan was performed under the supervision of the Cardiology staff. At peak effect of the drug, 30 mCi Tc-13m tetrofosmin was injected intravenously and standard myocardial SPECT imaging was performed. Quantitative gated imaging was also performed to evaluate left ventricular wall motion, and estimate left ventricular ejection fraction. COMPARISON:  None. FINDINGS: Perfusion: Fixed anteroapical and inferolateral defects on both resting and stress components. No suggestion of ischemia. Wall Motion: Normal left ventricular wall motion. No left ventricular dilation. Left Ventricular Ejection Fraction: 66 % End diastolic volume 69 ml End systolic volume 24 ml IMPRESSION: 1. No reversible ischemia. Fixed anteroapical and inferolateral defects. 2. Normal left ventricular wall motion. 3. Left ventricular ejection fraction 66% 4. Non invasive risk stratification*: Low *2012 Appropriate Use Criteria for Coronary Revascularization Focused Update: J Am Coll Cardiol. 4944;96(7):591-638. [email or web address]?[email or web address] Electronically Signed   By: Lucrezia Europe M.D.   On: 01/31/2017 15:56    Cardiac Studies   Lexiscan Myoview 01/31/17: IMPRESSION: 1. No reversible ischemia. Fixed anteroapical and inferolateral defects.  2. Normal left ventricular wall motion.  3. Left ventricular ejection fraction 66%  4. Non invasive risk stratification*: Low  Echo 01/23/17: Study Conclusions  - Left ventricle: The cavity size was normal. Wall thickness was   normal. Systolic function was normal. The estimated  ejection   fraction was in the range of 60% to 65%. Wall motion was normal;   there were no regional wall motion abnormalities. Features are   consistent with a pseudonormal left ventricular filling pattern,   with concomitant abnormal relaxation and increased filling   pressure (grade 2 diastolic dysfunction). - Aortic valve: There was no stenosis. There was trivial   regurgitation. - Mitral valve: There was mild regurgitation. - Left atrium: The atrium was moderately to  severely dilated. - Right ventricle: The cavity size was normal. Systolic function   was normal. - Right atrium: The atrium was moderately to severely dilated. - Tricuspid valve: Peak RV-RA gradient (S): 28 mm Hg. - Pulmonary arteries: PA peak pressure: 31 mm Hg (S). - Inferior vena cava: The vessel was normal in size. The   respirophasic diameter changes were in the normal range (>= 50%),   consistent with normal central venous pressure.  Patient Profile     83 y.o. female with chronic diastolic heart failure, hypertension, GERD, CKD III, B cell lymphoma, and MGUS here with new onset atrial fibrillation.    Assessment & Plan    # Paroxysmal atrial fibrillation: Ms. Sadowski is now maintaining sinus rhythm. Continue amiodarone, atenolol and Eliquis.  LFTs and thyroid function is normal.  She will need PFTs as an outpatient.    # Chest pain:  # Demand ischemia: Occurred in the setting of atrial fibrillation with RVR.  She is currently chest pain free.  Lexiscan Myoview was negative for ischemia.    # Hypertension:  BP remains elevated.  Losartan was started this admission.  Will increase to 50mg  daily.  Continue atenolol and HCTZ.  Repeat BMP in 1 week.   # Mitral valve prolapse: By report. Not seen on echo 01/2017.   Signed, Skeet Latch, MD  02/01/2017, 10:44 AM

## 2017-02-01 NOTE — Progress Notes (Signed)
Discharged home by wheelchair, accompanied by friend, discharge instructions given to pt. Belongings taken home

## 2017-02-01 NOTE — Progress Notes (Signed)
   02/01/17 0640  Vitals  BP (!) 194/72  BP Location Left Arm  BP Method Automatic  Pulse Rate (!) 53  ECG Heart Rate (!) 54  Resp 18  Oxygen Therapy  SpO2 93 %   Hospitalist paged. Hydralazine 10 mg IV ordered.

## 2017-02-01 NOTE — Discharge Instructions (Signed)

## 2017-02-03 ENCOUNTER — Telehealth: Payer: Self-pay | Admitting: Cardiology

## 2017-02-03 ENCOUNTER — Other Ambulatory Visit: Payer: Self-pay | Admitting: Hematology and Oncology

## 2017-02-03 ENCOUNTER — Telehealth: Payer: Self-pay

## 2017-02-03 DIAGNOSIS — C8290 Follicular lymphoma, unspecified, unspecified site: Secondary | ICD-10-CM

## 2017-02-03 DIAGNOSIS — I4891 Unspecified atrial fibrillation: Secondary | ICD-10-CM

## 2017-02-03 NOTE — Telephone Encounter (Signed)
Called with below message, verbalized understanding. 

## 2017-02-03 NOTE — Telephone Encounter (Signed)
-----   Message from Heath Lark, MD sent at 02/03/2017  7:16 AM EDT ----- Regarding: CXR PLs ask her to get CXR done first (walk in) before she checks in for labs and return visit here next week

## 2017-02-03 NOTE — Telephone Encounter (Signed)
-----   Message from Sueanne Margarita, MD sent at 02/01/2017 11:28 PM EDT ----- Moderately reduced DLCO - repeat PFTs with DLCO in 6 months

## 2017-02-03 NOTE — Telephone Encounter (Signed)
Informed patient of results and verbal understanding expressed.  Repeat PFTs ordered to be scheduled in 6 months. Patient agrees with treatment plan.

## 2017-02-03 NOTE — Telephone Encounter (Signed)
New message    Pt is calling about the test results that were done last week

## 2017-02-11 ENCOUNTER — Telehealth: Payer: Self-pay | Admitting: Hematology and Oncology

## 2017-02-11 ENCOUNTER — Other Ambulatory Visit (HOSPITAL_BASED_OUTPATIENT_CLINIC_OR_DEPARTMENT_OTHER): Payer: Medicare Other

## 2017-02-11 ENCOUNTER — Ambulatory Visit (HOSPITAL_BASED_OUTPATIENT_CLINIC_OR_DEPARTMENT_OTHER): Payer: Medicare Other | Admitting: Hematology and Oncology

## 2017-02-11 ENCOUNTER — Other Ambulatory Visit: Payer: Self-pay | Admitting: Hematology and Oncology

## 2017-02-11 ENCOUNTER — Encounter: Payer: Self-pay | Admitting: Hematology and Oncology

## 2017-02-11 ENCOUNTER — Ambulatory Visit (HOSPITAL_COMMUNITY)
Admission: RE | Admit: 2017-02-11 | Discharge: 2017-02-11 | Disposition: A | Discharge status: Home / Self Care | Payer: Medicare Other | Source: Ambulatory Visit | Attending: Hematology and Oncology | Admitting: Hematology and Oncology

## 2017-02-11 VITALS — BP 187/66 | HR 52 | Temp 97.7°F | Resp 17 | Ht 64.0 in | Wt 118.1 lb

## 2017-02-11 DIAGNOSIS — Z8572 Personal history of non-Hodgkin lymphomas: Secondary | ICD-10-CM

## 2017-02-11 DIAGNOSIS — C8290 Follicular lymphoma, unspecified, unspecified site: Secondary | ICD-10-CM | POA: Insufficient documentation

## 2017-02-11 DIAGNOSIS — I517 Cardiomegaly: Secondary | ICD-10-CM | POA: Insufficient documentation

## 2017-02-11 DIAGNOSIS — D472 Monoclonal gammopathy: Secondary | ICD-10-CM

## 2017-02-11 DIAGNOSIS — J439 Emphysema, unspecified: Secondary | ICD-10-CM | POA: Insufficient documentation

## 2017-02-11 DIAGNOSIS — I10 Essential (primary) hypertension: Secondary | ICD-10-CM

## 2017-02-11 LAB — CBC WITH DIFFERENTIAL/PLATELET
BASO%: 0.5 % (ref 0.0–2.0)
Basophils Absolute: 0 10*3/uL (ref 0.0–0.1)
EOS%: 8.4 % — AB (ref 0.0–7.0)
Eosinophils Absolute: 0.5 10*3/uL (ref 0.0–0.5)
HEMATOCRIT: 33.6 % — AB (ref 34.8–46.6)
HGB: 11.3 g/dL — ABNORMAL LOW (ref 11.6–15.9)
LYMPH#: 0.7 10*3/uL — AB (ref 0.9–3.3)
LYMPH%: 11.3 % — ABNORMAL LOW (ref 14.0–49.7)
MCH: 32.6 pg (ref 25.1–34.0)
MCHC: 33.6 g/dL (ref 31.5–36.0)
MCV: 96.9 fL (ref 79.5–101.0)
MONO#: 0.6 10*3/uL (ref 0.1–0.9)
MONO%: 9.5 % (ref 0.0–14.0)
NEUT#: 4.2 10*3/uL (ref 1.5–6.5)
NEUT%: 70.3 % (ref 38.4–76.8)
Platelets: 173 10*3/uL (ref 145–400)
RBC: 3.47 10*6/uL — ABNORMAL LOW (ref 3.70–5.45)
RDW: 12.7 % (ref 11.2–14.5)
WBC: 6 10*3/uL (ref 3.9–10.3)

## 2017-02-11 LAB — COMPREHENSIVE METABOLIC PANEL
ALBUMIN: 3.6 g/dL (ref 3.5–5.0)
ALK PHOS: 84 U/L (ref 40–150)
ALT: 9 U/L (ref 0–55)
ANION GAP: 9 meq/L (ref 3–11)
AST: 18 U/L (ref 5–34)
BILIRUBIN TOTAL: 0.35 mg/dL (ref 0.20–1.20)
BUN: 25 mg/dL (ref 7.0–26.0)
CALCIUM: 9.4 mg/dL (ref 8.4–10.4)
CO2: 26 mEq/L (ref 22–29)
Chloride: 103 mEq/L (ref 98–109)
Creatinine: 1.3 mg/dL — ABNORMAL HIGH (ref 0.6–1.1)
EGFR: 37 mL/min/{1.73_m2} — AB (ref >90)
GLUCOSE: 78 mg/dL (ref 70–140)
Potassium: 3.7 mEq/L (ref 3.5–5.1)
Sodium: 138 mEq/L (ref 136–145)
TOTAL PROTEIN: 7.8 g/dL (ref 6.4–8.3)

## 2017-02-11 LAB — LACTATE DEHYDROGENASE: LDH: 160 U/L (ref 125–245)

## 2017-02-11 NOTE — Assessment & Plan Note (Signed)
The patient has no significant palpable lymph node on exam today to suggest recurrence of follicular lymphoma Overall disease burden is small and she is relatively asymptomatic Blood work is stable Recommend observation in 3 months If she show significant disease progression, we will repeat imaging study and possibly biopsy before treatment.

## 2017-02-11 NOTE — Assessment & Plan Note (Signed)
Repeat MGUS is pending but overall no evidence of life-threatening organ damage that would warrant the start of treatment. I recommend close observation and repeat myeloma panel in 3 months

## 2017-02-11 NOTE — Telephone Encounter (Signed)
Scheduled appt per 7/24 los - Gave patient AVS and calender per los.  

## 2017-02-11 NOTE — Progress Notes (Signed)
[facility] OFFICE PROGRESS NOTE  Patient Care Team: Gaynelle Arabian, MD as PCP - General (Family Medicine) Heath Lark, MD as Consulting Physician (Hematology and Oncology)  SUMMARY OF ONCOLOGIC HISTORY:  [name] was transferred to my care after her prior physician has left.  I reviewed the patient's records extensive and collaborated the history with the patient. Summary of her history is as follows: This patient was diagnosed with lymphoma in 2013. CT scan of the abdomen and pelvis on 10/02/2011 that noted shotty bilateral inguinal nodes and a hiatal hernia, otherwise negative. The liver, spleen and pancreas were unremarkable. There was no mesenteric or retroperitoneal adenopathy. She was referred to Dr. Neldon Mc who, on 11/18/2011, performed an excisional biopsy of right inguinal node. Surgical pathology revealed atypical follicular proliferation which was positive for BCl-2 and CD20. The overall impression was that this was clearly involvement of follicular lymphoma. (Case number [id number]). She was initiated on Rituxan treatments, 375 mg/m2=600 mg q wk x 4, From 01/30/2012-02/20/12. PET scan on 12/23/11 had shown axillary, subpectoral and inguinal lymphadenopathy. A PET scan on 02/19/12 did show a complete metabolic response to therapy with only 1 focus of very mild hypermetabolic activity in the right axilla. She is now on close surveillance.  She was started on placed on intermittent prednisone for hives She was admitted to the hospital in July 2018 for workup of chest pain  INTERVAL HISTORY: Please see below for problem oriented charting. She feels well Denies recent chest pain or shortness of breath since discharge Her chronic bilateral lower extremity edema, stable She denies new lymphadenopathy or infection.  No new bone pain  REVIEW OF SYSTEMS:   Constitutional: Denies fevers, chills or abnormal weight loss Eyes: Denies blurriness of vision Ears, nose,  mouth, throat, and face: Denies mucositis or sore throat Respiratory: Denies cough, dyspnea or wheezes Cardiovascular: Denies palpitation, chest discomfort  Gastrointestinal:  Denies nausea, heartburn or change in bowel habits Skin: Denies abnormal skin rashes Lymphatics: Denies new lymphadenopathy or easy bruising Neurological:Denies numbness, tingling or new weaknesses Behavioral/Psych: Mood is stable, no new changes  All other systems were reviewed with the patient and are negative.  I have reviewed the past medical history, past surgical history, social history and family history with the patient and they are unchanged from previous note.  ALLERGIES:  is allergic to amoxicillin; aspirin; benadryl [diphenhydramine hcl]; ibuprofen; sulfa antibiotics; sulindac; and penicillins.  MEDICATIONS:  Current Outpatient Prescriptions  Medication Sig Dispense Refill  . albuterol (PROVENTIL HFA;VENTOLIN HFA) 108 (90 Base) MCG/ACT inhaler Inhale 2 puffs into the lungs every 6 (six) hours as needed for wheezing or shortness of breath. 1 Inhaler 2  . amiodarone (PACERONE) 200 MG tablet Take 1 tablet (200 mg total) by mouth daily. 30 tablet 2  . apixaban (ELIQUIS) 2.5 MG TABS tablet Take 1 tablet (2.5 mg total) by mouth 2 (two) times daily. 60 tablet 2  . atenolol (TENORMIN) 50 MG tablet Take 50 mg by mouth daily.    . Calcium Carbonate-Vitamin D (CALCIUM-D) 600-400 MG-UNIT TABS Take by mouth daily.     . chlordiazePOXIDE (LIBRIUM) 10 MG capsule Take 10 mg by mouth 2 (two) times daily as needed for anxiety.     Marland Kitchen dextromethorphan-guaiFENesin (MUCINEX DM) 30-600 MG per 12 hr tablet Take 1 tablet by mouth daily as needed (sinus).     . fluticasone (FLONASE) 50 MCG/ACT nasal spray Place 2 sprays into the nose daily as needed for allergies.     Marland Kitchen  hydrochlorothiazide (MICROZIDE) 12.5 MG capsule Take 1 capsule (12.5 mg total) by mouth daily. 30 capsule 2  . HYDROcodone-acetaminophen (NORCO) 5-325 MG per tablet  Take 1 tablet by mouth every 6 (six) hours as needed for moderate pain.     . hyoscyamine (LEVSIN SL) 0.125 MG SL tablet Place 0.125 mg under the tongue every 4 (four) hours as needed for cramping. Reported on 09/25/2015    . losartan (COZAAR) 50 MG tablet Take 1 tablet (50 mg total) by mouth daily. 30 tablet 2  . meclizine (ANTIVERT) 50 MG tablet Take 0.5 tablets (25 mg total) by mouth 3 (three) times daily as needed. 30 tablet 0  . Multiple Vitamin (MULTIVITAMIN) capsule Take 1 capsule by mouth daily.    . nabumetone (RELAFEN) 750 MG tablet Take 750 mg by mouth 2 (two) times daily.    . primidone (MYSOLINE) 50 MG tablet 2 in the morning, 1 in the evening (Patient taking differently: Take 50-100 mg by mouth See admin instructions. Take 2 tablets every morning and take 1 every evening) 90 tablet 5  . Propylene Glycol (SYSTANE BALANCE) 0.6 % SOLN Apply 1-2 drops to eye 3 (three) times daily as needed (dry eyes.).     Marland Kitchen ranitidine (ZANTAC) 300 MG tablet Take 300 mg by mouth at bedtime as needed for heartburn.     . sertraline (ZOLOFT) 50 MG tablet Take 50 mg by mouth daily.     No current facility-administered medications for this visit.     PHYSICAL EXAMINATION: ECOG PERFORMANCE STATUS: 2 - Symptomatic, <50% confined to bed  Vitals:   02/11/17 0917  BP: (!) 187/66  Pulse: (!) 52  Resp: 17  Temp: 97.7 F (36.5 C)   Filed Weights   02/11/17 0917  Weight: 118 lb 1.6 oz (53.6 kg)    GENERAL:alert, no distress and comfortable SKIN: She have chronic dermatitis EYES: normal, Conjunctiva are pink and non-injected, sclera clear OROPHARYNX:no exudate, no erythema and lips, buccal mucosa, and tongue normal  NECK: supple, thyroid normal size, non-tender, without nodularity LYMPH:  no palpable lymphadenopathy in the cervical, axillary or inguinal LUNGS: clear to auscultation and percussion with normal breathing effort HEART: regular rate & rhythm and no murmurs and no lower extremity  edema ABDOMEN:abdomen soft, non-tender and normal bowel sounds Musculoskeletal:no cyanosis of digits and no clubbing  NEURO: alert & oriented x 3 with fluent speech, no focal motor/sensory deficits  LABORATORY DATA:  I have reviewed the data as listed    Component Value Date/Time   NA 138 02/11/2017 0900   K 3.7 02/11/2017 0900   CL 102 02/01/2017 0337   CL 103 10/09/2012 1036   CO2 26 02/11/2017 0900   GLUCOSE 78 02/11/2017 0900   GLUCOSE 89 10/09/2012 1036   BUN 25.0 02/11/2017 0900   CREATININE 1.3 (H) 02/11/2017 0900   CALCIUM 9.4 02/11/2017 0900   PROT 7.8 02/11/2017 0900   ALBUMIN 3.6 02/11/2017 0900   AST 18 02/11/2017 0900   ALT 9 02/11/2017 0900   ALKPHOS 84 02/11/2017 0900   BILITOT 0.35 02/11/2017 0900   GFRNONAA 43 (L) 02/01/2017 0337   GFRAA 49 (L) 02/01/2017 0337    No results found for: SPEP, UPEP  Lab Results  Component Value Date   WBC 6.0 02/11/2017   NEUTROABS 4.2 02/11/2017   HGB 11.3 (L) 02/11/2017   HCT 33.6 (L) 02/11/2017   MCV 96.9 02/11/2017   PLT 173 02/11/2017      Chemistry  Component Value Date/Time   NA 138 02/11/2017 0900   K 3.7 02/11/2017 0900   CL 102 02/01/2017 0337   CL 103 10/09/2012 1036   CO2 26 02/11/2017 0900   BUN 25.0 02/11/2017 0900   CREATININE 1.3 (H) 02/11/2017 0900      Component Value Date/Time   CALCIUM 9.4 02/11/2017 0900   ALKPHOS 84 02/11/2017 0900   AST 18 02/11/2017 0900   ALT 9 02/11/2017 0900   BILITOT 0.35 02/11/2017 0900       RADIOGRAPHIC STUDIES: I have personally reviewed the radiological images as listed and agreed with the findings in the report. Dg Chest 2 View  Result Date: 02/11/2017 CLINICAL DATA:  History of lung nodule, followup, hypertension EXAM: CHEST  2 VIEW COMPARISON:  Chest x-ray of 01/30/2017 and CT chest of 08/14/2012 FINDINGS: The lungs remain hyperaerated with increased AP diameter consistent with emphysema. No definite nodule is seen at the site questioned on prior  chest x-ray in the right upper lung peripherally. Mediastinal and hilar contours are unremarkable. The heart is mildly enlarged and stable. Mild thoracolumbar scoliosis is present with associated degenerative change. IMPRESSION: 1. No lung nodule is seen. 2. Emphysema. 3. Stable mild cardiomegaly. Electronically Signed   By: Ivar Drape M.D.   On: 02/11/2017 08:36   Dg Chest 2 View  Result Date: 01/30/2017 CLINICAL DATA:  83 year old female with chest pain. EXAM: CHEST  2 VIEW COMPARISON:  Chest radiograph dated 01/08/2017 and chest CT dated 11/12/2012 FINDINGS: There is emphysematous changes of the lungs. No focal consolidation, pleural effusion, or pneumothorax. A 9 mm nodular density in the right upper lung field may be artifactual. CT may provide better evaluation. Top-normal cardiac silhouette. Osteopenia with degenerative changes of the spine and shoulders. No acute osseous pathology. Atherosclerotic calcification of the aorta. IMPRESSION: 1. No acute cardiopulmonary process. 2. Artifact versus a 9 mm right upper lobe nodule. CT may provide better evaluation if clinically indicated. Electronically Signed   By: Anner Crete M.D.   On: 01/30/2017 05:16   Nm Myocar Multi W/spect W/wall Motion / Ef  Result Date: 01/31/2017 CLINICAL DATA:  Chest pain, hypertension EXAM: MYOCARDIAL IMAGING WITH SPECT (REST AND PHARMACOLOGIC-STRESS) GATED LEFT VENTRICULAR WALL MOTION STUDY LEFT VENTRICULAR EJECTION FRACTION TECHNIQUE: Standard myocardial SPECT imaging was performed after resting intravenous injection of 10 mCi Tc-51mtetrofosmin. Subsequently, intravenous infusion of Lexiscan was performed under the supervision of the Cardiology staff. At peak effect of the drug, 30 mCi Tc-934metrofosmin was injected intravenously and standard myocardial SPECT imaging was performed. Quantitative gated imaging was also performed to evaluate left ventricular wall motion, and estimate left ventricular ejection fraction.  COMPARISON:  None. FINDINGS: Perfusion: Fixed anteroapical and inferolateral defects on both resting and stress components. No suggestion of ischemia. Wall Motion: Normal left ventricular wall motion. No left ventricular dilation. Left Ventricular Ejection Fraction: 66 % End diastolic volume 69 ml End systolic volume 24 ml IMPRESSION: 1. No reversible ischemia. Fixed anteroapical and inferolateral defects. 2. Normal left ventricular wall motion. 3. Left ventricular ejection fraction 66% 4. Non invasive risk stratification*: Low *2012 Appropriate Use Criteria for Coronary Revascularization Focused Update: J Am Coll Cardiol. 209147;82(9):562-130http://[email or web address]?[email or web address] Electronically Signed   By: D Lucrezia Europe.D.   On: 01/31/2017 15:56    ASSESSMENT & PLAN:  MGUS (monoclonal gammopathy of unknown significance) Repeat MGUS is pending but overall no evidence of life-threatening organ damage that would warrant the start of treatment. I recommend close observation and repeat myeloma  panel in 3 months  History of B-cell lymphoma The patient has no significant palpable lymph node on exam today to suggest recurrence of follicular lymphoma Overall disease burden is small and she is relatively asymptomatic Blood work is stable Recommend observation in 3 months If she show significant disease progression, we will repeat imaging study and possibly biopsy before treatment.  Systolic hypertension Her blood pressure is elevated Continue medical management   Orders Placed This Encounter  Procedures  . Comprehensive metabolic panel    Standing Status:   Future    Standing Expiration Date:   03/18/2018  . CBC with Differential/Platelet    Standing Status:   Future    Standing Expiration Date:   03/18/2018  . Kappa/lambda light chains    Standing Status:   Future    Standing Expiration Date:   03/18/2018  . Multiple Myeloma Panel (SPEP&IFE w/QIG)    Standing Status:    Future    Standing Expiration Date:   03/18/2018   All questions were answered. The patient knows to call the clinic with any problems, questions or concerns. No barriers to learning was detected. I spent 15 minutes counseling the patient face to face. The total time spent in the appointment was 20 minutes and more than 50% was on counseling and review of test results     Heath Lark, MD 02/11/2017 9:58 AM

## 2017-02-11 NOTE — Assessment & Plan Note (Signed)
Her blood pressure is elevated Continue medical management

## 2017-02-12 LAB — KAPPA/LAMBDA LIGHT CHAINS
IG KAPPA FREE LIGHT CHAIN: 127.3 mg/L — AB (ref 3.3–19.4)
IG LAMBDA FREE LIGHT CHAIN: 9.9 mg/L (ref 5.7–26.3)
KAPPA/LAMBDA FLC RATIO: 12.86 — AB (ref 0.26–1.65)

## 2017-02-16 LAB — MULTIPLE MYELOMA PANEL, SERUM
ALBUMIN SERPL ELPH-MCNC: 3.7 g/dL (ref 2.9–4.4)
ALPHA2 GLOB SERPL ELPH-MCNC: 0.7 g/dL (ref 0.4–1.0)
Albumin/Glob SerPl: 1.1 (ref 0.7–1.7)
Alpha 1: 0.3 g/dL (ref 0.0–0.4)
B-Globulin SerPl Elph-Mcnc: 0.8 g/dL (ref 0.7–1.3)
Gamma Glob SerPl Elph-Mcnc: 1.9 g/dL — ABNORMAL HIGH (ref 0.4–1.8)
Globulin, Total: 3.7 g/dL (ref 2.2–3.9)
IGM (IMMUNOGLOBIN M), SRM: 14 mg/dL — AB (ref 26–217)
IgA, Qn, Serum: 30 mg/dL — ABNORMAL LOW (ref 64–422)
M Protein SerPl Elph-Mcnc: 1.6 g/dL — ABNORMAL HIGH
TOTAL PROTEIN: 7.4 g/dL (ref 6.0–8.5)

## 2017-05-01 ENCOUNTER — Other Ambulatory Visit: Payer: Self-pay | Admitting: Family Medicine

## 2017-05-01 DIAGNOSIS — Z1231 Encounter for screening mammogram for malignant neoplasm of breast: Secondary | ICD-10-CM

## 2017-05-06 ENCOUNTER — Telehealth: Payer: Self-pay | Admitting: *Deleted

## 2017-05-06 NOTE — Telephone Encounter (Signed)
[name] left a message stating that her phone is out, but she is calling to confirm her message on 10/23 for lab and Dr Alvy Bimler

## 2017-05-13 ENCOUNTER — Encounter: Payer: Self-pay | Admitting: Hematology and Oncology

## 2017-05-13 ENCOUNTER — Other Ambulatory Visit (HOSPITAL_BASED_OUTPATIENT_CLINIC_OR_DEPARTMENT_OTHER): Payer: Medicare Other

## 2017-05-13 ENCOUNTER — Telehealth: Payer: Self-pay

## 2017-05-13 ENCOUNTER — Ambulatory Visit (HOSPITAL_BASED_OUTPATIENT_CLINIC_OR_DEPARTMENT_OTHER): Payer: Medicare Other | Admitting: Hematology and Oncology

## 2017-05-13 VITALS — BP 196/121 | HR 57 | Temp 98.1°F | Resp 17 | Ht 64.0 in | Wt 118.5 lb

## 2017-05-13 DIAGNOSIS — N183 Chronic kidney disease, stage 3 unspecified: Secondary | ICD-10-CM

## 2017-05-13 DIAGNOSIS — D472 Monoclonal gammopathy: Secondary | ICD-10-CM

## 2017-05-13 DIAGNOSIS — D61818 Other pancytopenia: Secondary | ICD-10-CM | POA: Diagnosis not present

## 2017-05-13 DIAGNOSIS — Z8572 Personal history of non-Hodgkin lymphomas: Secondary | ICD-10-CM | POA: Diagnosis not present

## 2017-05-13 DIAGNOSIS — I10 Essential (primary) hypertension: Secondary | ICD-10-CM | POA: Diagnosis not present

## 2017-05-13 DIAGNOSIS — D649 Anemia, unspecified: Secondary | ICD-10-CM | POA: Diagnosis not present

## 2017-05-13 DIAGNOSIS — C8290 Follicular lymphoma, unspecified, unspecified site: Secondary | ICD-10-CM

## 2017-05-13 LAB — CBC WITH DIFFERENTIAL/PLATELET
BASO%: 1.5 % (ref 0.0–2.0)
BASOS ABS: 0.1 10*3/uL (ref 0.0–0.1)
EOS ABS: 0.3 10*3/uL (ref 0.0–0.5)
EOS%: 6.3 % (ref 0.0–7.0)
HCT: 31.1 % — ABNORMAL LOW (ref 34.8–46.6)
HEMOGLOBIN: 10.4 g/dL — AB (ref 11.6–15.9)
LYMPH#: 1.5 10*3/uL (ref 0.9–3.3)
LYMPH%: 36.2 % (ref 14.0–49.7)
MCH: 33.3 pg (ref 25.1–34.0)
MCHC: 33.3 g/dL (ref 31.5–36.0)
MCV: 99.8 fL (ref 79.5–101.0)
MONO#: 0.3 10*3/uL (ref 0.1–0.9)
MONO%: 7.4 % (ref 0.0–14.0)
NEUT#: 2 10*3/uL (ref 1.5–6.5)
NEUT%: 48.6 % (ref 38.4–76.8)
PLATELETS: 142 10*3/uL — AB (ref 145–400)
RBC: 3.11 10*6/uL — ABNORMAL LOW (ref 3.70–5.45)
RDW: 14.4 % (ref 11.2–14.5)
WBC: 4.1 10*3/uL (ref 3.9–10.3)

## 2017-05-13 LAB — COMPREHENSIVE METABOLIC PANEL
ALT: 7 U/L (ref 0–55)
ANION GAP: 9 meq/L (ref 3–11)
AST: 45 U/L — ABNORMAL HIGH (ref 5–34)
Albumin: 3.6 g/dL (ref 3.5–5.0)
Alkaline Phosphatase: 59 U/L (ref 40–150)
BILIRUBIN TOTAL: 0.37 mg/dL (ref 0.20–1.20)
BUN: 24.9 mg/dL (ref 7.0–26.0)
CHLORIDE: 103 meq/L (ref 98–109)
CO2: 29 meq/L (ref 22–29)
Calcium: 9.4 mg/dL (ref 8.4–10.4)
Creatinine: 1.4 mg/dL — ABNORMAL HIGH (ref 0.6–1.1)
EGFR: 35 mL/min/{1.73_m2} — AB (ref >60)
GLUCOSE: 86 mg/dL (ref 70–140)
POTASSIUM: 3.8 meq/L (ref 3.5–5.1)
SODIUM: 140 meq/L (ref 136–145)
TOTAL PROTEIN: 7.7 g/dL (ref 6.4–8.3)

## 2017-05-13 NOTE — Assessment & Plan Note (Signed)
Repeat MGUS is pending but overall no evidence of life-threatening organ damage that would warrant the start of treatment. I recommend close observation and repeat myeloma panel in 6 months

## 2017-05-13 NOTE — Telephone Encounter (Signed)
Printed avs and calender for upcoming appointment in April. Per 10/23 los

## 2017-05-13 NOTE — Progress Notes (Signed)
[facility] OFFICE PROGRESS NOTE  Patient Care Team: Gaynelle Arabian, MD as PCP - General (Family Medicine) Heath Lark, MD as Consulting Physician (Hematology and Oncology)  SUMMARY OF ONCOLOGIC HISTORY:  [name] was transferred to my care after her prior physician has left.  I reviewed the patient's records extensive and collaborated the history with the patient. Summary of her history is as follows: This patient was diagnosed with lymphoma in 2013. CT scan of the abdomen and pelvis on 10/02/2011 that noted shotty bilateral inguinal nodes and a hiatal hernia, otherwise negative. The liver, spleen and pancreas were unremarkable. There was no mesenteric or retroperitoneal adenopathy. She was referred to Dr. Neldon Mc who, on 11/18/2011, performed an excisional biopsy of right inguinal node. Surgical pathology revealed atypical follicular proliferation which was positive for BCl-2 and CD20. The overall impression was that this was clearly involvement of follicular lymphoma. (Case number [id number]). She was initiated on Rituxan treatments, 375 mg/m2=600 mg q wk x 4, From 01/30/2012-02/20/12. PET scan on 12/23/11 had shown axillary, subpectoral and inguinal lymphadenopathy. A PET scan on 02/19/12 did show a complete metabolic response to therapy with only 1 focus of very mild hypermetabolic activity in the right axilla. She is now on close surveillance.  She was started on placed on intermittent prednisone for hives She was admitted to the hospital in July 2018 for workup of chest pain and had recent hospitalization for atrial fibrillation She also has MGUS, on observation  INTERVAL HISTORY: Please see below for problem oriented charting. She feels well No new lymphadenopathy No new bone pain No recent infection The patient denies any recent signs or symptoms of bleeding such as spontaneous epistaxis, hematuria or hematochezia.  REVIEW OF SYSTEMS:   Constitutional:  Denies fevers, chills or abnormal weight loss Eyes: Denies blurriness of vision Ears, nose, mouth, throat, and face: Denies mucositis or sore throat Respiratory: Denies cough, dyspnea or wheezes Cardiovascular: Denies palpitation, chest discomfort or lower extremity swelling Gastrointestinal:  Denies nausea, heartburn or change in bowel habits Skin: Denies abnormal skin rashes Lymphatics: Denies new lymphadenopathy or easy bruising Neurological:Denies numbness, tingling or new weaknesses Behavioral/Psych: Mood is stable, no new changes  All other systems were reviewed with the patient and are negative.  I have reviewed the past medical history, past surgical history, social history and family history with the patient and they are unchanged from previous note.  ALLERGIES:  is allergic to amoxicillin; aspirin; benadryl [diphenhydramine hcl]; ibuprofen; sulfa antibiotics; sulindac; and penicillins.  MEDICATIONS:  Current Outpatient Prescriptions  Medication Sig Dispense Refill  . albuterol (PROVENTIL HFA;VENTOLIN HFA) 108 (90 Base) MCG/ACT inhaler Inhale 2 puffs into the lungs every 6 (six) hours as needed for wheezing or shortness of breath. 1 Inhaler 2  . amiodarone (PACERONE) 200 MG tablet Take 1 tablet (200 mg total) by mouth daily. 30 tablet 2  . apixaban (ELIQUIS) 2.5 MG TABS tablet Take 1 tablet (2.5 mg total) by mouth 2 (two) times daily. 60 tablet 2  . atenolol (TENORMIN) 50 MG tablet Take 50 mg by mouth daily.    . Calcium Carbonate-Vitamin D (CALCIUM-D) 600-400 MG-UNIT TABS Take by mouth daily.     . chlordiazePOXIDE (LIBRIUM) 10 MG capsule Take 10 mg by mouth 2 (two) times daily as needed for anxiety.     Marland Kitchen dextromethorphan-guaiFENesin (MUCINEX DM) 30-600 MG per 12 hr tablet Take 1 tablet by mouth daily as needed (sinus).     . fluticasone (FLONASE) 50 MCG/ACT  nasal spray Place 2 sprays into the nose daily as needed for allergies.     . hydrochlorothiazide (MICROZIDE) 12.5 MG  capsule Take 1 capsule (12.5 mg total) by mouth daily. 30 capsule 2  . HYDROcodone-acetaminophen (NORCO) 5-325 MG per tablet Take 1 tablet by mouth every 6 (six) hours as needed for moderate pain.     . hyoscyamine (LEVSIN SL) 0.125 MG SL tablet Place 0.125 mg under the tongue every 4 (four) hours as needed for cramping. Reported on 09/25/2015    . losartan (COZAAR) 50 MG tablet Take 1 tablet (50 mg total) by mouth daily. 30 tablet 2  . meclizine (ANTIVERT) 50 MG tablet Take 0.5 tablets (25 mg total) by mouth 3 (three) times daily as needed. 30 tablet 0  . Multiple Vitamin (MULTIVITAMIN) capsule Take 1 capsule by mouth daily.    . nabumetone (RELAFEN) 750 MG tablet Take 750 mg by mouth 2 (two) times daily.    . primidone (MYSOLINE) 50 MG tablet 2 in the morning, 1 in the evening (Patient taking differently: Take 50-100 mg by mouth See admin instructions. Take 2 tablets every morning and take 1 every evening) 90 tablet 5  . Propylene Glycol (SYSTANE BALANCE) 0.6 % SOLN Apply 1-2 drops to eye 3 (three) times daily as needed (dry eyes.).     Marland Kitchen ranitidine (ZANTAC) 300 MG tablet Take 300 mg by mouth at bedtime as needed for heartburn.     . sertraline (ZOLOFT) 50 MG tablet Take 50 mg by mouth daily.     No current facility-administered medications for this visit.     PHYSICAL EXAMINATION: ECOG PERFORMANCE STATUS: 2 - Symptomatic, <50% confined to bed  Vitals:   05/13/17 0940  BP: (!) 196/121  Pulse: (!) 57  Resp: 17  Temp: 98.1 F (36.7 C)  SpO2: 100%   Filed Weights   05/13/17 0940  Weight: 118 lb 8 oz (53.8 kg)    GENERAL:alert, no distress and comfortable SKIN: skin color, texture, turgor are normal, no rashes or significant lesions EYES: normal, Conjunctiva are pink and non-injected, sclera clear OROPHARYNX:no exudate, no erythema and lips, buccal mucosa, and tongue normal  NECK: supple, thyroid normal size, non-tender, without nodularity LYMPH:  no palpable lymphadenopathy in the  cervical, axillary or inguinal LUNGS: clear to auscultation and percussion with normal breathing effort HEART: regular rate & rhythm and no murmurs and no lower extremity edema ABDOMEN:abdomen soft, non-tender and normal bowel sounds Musculoskeletal:no cyanosis of digits and no clubbing  NEURO: alert & oriented x 3 with fluent speech, no focal motor/sensory deficits  LABORATORY DATA:  I have reviewed the data as listed    Component Value Date/Time   NA 140 05/13/2017 0914   K 3.8 05/13/2017 0914   CL 102 02/01/2017 0337   CL 103 10/09/2012 1036   CO2 29 05/13/2017 0914   GLUCOSE 86 05/13/2017 0914   GLUCOSE 89 10/09/2012 1036   BUN 24.9 05/13/2017 0914   CREATININE 1.4 (H) 05/13/2017 0914   CALCIUM 9.4 05/13/2017 0914   PROT 7.7 05/13/2017 0914   ALBUMIN 3.6 05/13/2017 0914   AST 45 (H) 05/13/2017 0914   ALT 7 05/13/2017 0914   ALKPHOS 59 05/13/2017 0914   BILITOT 0.37 05/13/2017 0914   GFRNONAA 43 (L) 02/01/2017 0337   GFRAA 49 (L) 02/01/2017 0337    No results found for: SPEP, UPEP  Lab Results  Component Value Date   WBC 4.1 05/13/2017   NEUTROABS 2.0 05/13/2017  HGB 10.4 (L) 05/13/2017   HCT 31.1 (L) 05/13/2017   MCV 99.8 05/13/2017   PLT 142 (L) 05/13/2017      Chemistry      Component Value Date/Time   NA 140 05/13/2017 0914   K 3.8 05/13/2017 0914   CL 102 02/01/2017 0337   CL 103 10/09/2012 1036   CO2 29 05/13/2017 0914   BUN 24.9 05/13/2017 0914   CREATININE 1.4 (H) 05/13/2017 0914      Component Value Date/Time   CALCIUM 9.4 05/13/2017 0914   ALKPHOS 59 05/13/2017 0914   AST 45 (H) 05/13/2017 0914   ALT 7 05/13/2017 0914   BILITOT 0.37 05/13/2017 0914      ASSESSMENT & PLAN:  MGUS (monoclonal gammopathy of unknown significance) Repeat MGUS is pending but overall no evidence of life-threatening organ damage that would warrant the start of treatment. I recommend close observation and repeat myeloma panel in 6 months  CKD (chronic kidney  disease) stage 3, GFR 30-59 ml/min (HCC) She has borderline CKD stage III to IV Continue risk factors modification  Systolic hypertension Her blood pressure is elevated There is also a component of white coat hypertension Continue medical management  Pancytopenia, acquired ([place]) She has mild chronic anemia and intermittent thrombocytopenia Recommend observation only for now  History of B-cell lymphoma She has no detectable signs of lymphoma recurrence Will continue surveillance observation   Orders Placed This Encounter  Procedures  . CBC with Differential/Platelet    Standing Status:   Future    Standing Expiration Date:   06/17/2018  . Comprehensive metabolic panel    Standing Status:   Future    Standing Expiration Date:   06/17/2018  . Kappa/lambda light chains    Standing Status:   Future    Standing Expiration Date:   06/17/2018  . Multiple Myeloma Panel (SPEP&IFE w/QIG)    Standing Status:   Future    Standing Expiration Date:   06/17/2018   All questions were answered. The patient knows to call the clinic with any problems, questions or concerns. No barriers to learning was detected. I spent 15 minutes counseling the patient face to face. The total time spent in the appointment was 20 minutes and more than 50% was on counseling and review of test results     Heath Lark, MD 05/13/2017 7:33 PM

## 2017-05-13 NOTE — Assessment & Plan Note (Signed)
She has no detectable signs of lymphoma recurrence Will continue surveillance observation

## 2017-05-13 NOTE — Assessment & Plan Note (Signed)
Her blood pressure is elevated There is also a component of white coat hypertension Continue medical management

## 2017-05-13 NOTE — Assessment & Plan Note (Signed)
She has borderline CKD stage III to IV Continue risk factors modification

## 2017-05-13 NOTE — Assessment & Plan Note (Signed)
She has mild chronic anemia and intermittent thrombocytopenia Recommend observation only for now

## 2017-05-14 LAB — KAPPA/LAMBDA LIGHT CHAINS
Ig Kappa Free Light Chain: 121.3 mg/L — ABNORMAL HIGH (ref 3.3–19.4)
Ig Lambda Free Light Chain: 12.5 mg/L (ref 5.7–26.3)
Kappa/Lambda FluidC Ratio: 9.7 — ABNORMAL HIGH (ref 0.26–1.65)

## 2017-05-16 LAB — MULTIPLE MYELOMA PANEL, SERUM
ALBUMIN SERPL ELPH-MCNC: 3.7 g/dL (ref 2.9–4.4)
ALBUMIN/GLOB SERPL: 1.1 (ref 0.7–1.7)
ALPHA 1: 0.3 g/dL (ref 0.0–0.4)
ALPHA2 GLOB SERPL ELPH-MCNC: 0.6 g/dL (ref 0.4–1.0)
B-GLOBULIN SERPL ELPH-MCNC: 0.8 g/dL (ref 0.7–1.3)
GAMMA GLOB SERPL ELPH-MCNC: 1.8 g/dL (ref 0.4–1.8)
GLOBULIN, TOTAL: 3.6 g/dL (ref 2.2–3.9)
IgA, Qn, Serum: 33 mg/dL — ABNORMAL LOW (ref 64–422)
IgG, Qn, Serum: 2162 mg/dL — ABNORMAL HIGH (ref 700–1600)
IgM, Qn, Serum: 16 mg/dL — ABNORMAL LOW (ref 26–217)
M PROTEIN SERPL ELPH-MCNC: 1.5 g/dL — AB
Total Protein: 7.3 g/dL (ref 6.0–8.5)

## 2017-06-05 ENCOUNTER — Ambulatory Visit: Payer: Medicare Other

## 2017-07-01 ENCOUNTER — Ambulatory Visit: Payer: Medicare Other

## 2017-07-23 NOTE — Progress Notes (Signed)
Subjective:   [name] was seen in consultation in the movement disorder clinic at the request of Gaynelle Arabian, MD.  The evaluation is for tremor.  The patient is a 84 y.o. right handed female with a history of tremor.  Pt reports that she has had tremor for about 10 years and remembers having trouble at work when she was typing and she thought that was the problem.  Tremor has increased over the last few years.  Tremor increases with stress.  The right hand is worse than the L but "it is moving to the L."  States that she feels tremor on the inside and in the legs States that she is having trouble writing because of the tremor, but not because of micrographia.  States that she just wanted a "pill" for the tremor.  There is a family hx of tremor in her mother, her sister and her niece.    Tremor characteristics: Affected by caffeine:  No. (only 1/2 cup coffee in AM) Affected by alcohol: doesn't drink any alcohol Affected by stress:  Yes.   Affected by fatigue:  Yes.   Spills soup if on spoon:  No., but has to hold it steady or hold the wrist Spills glass of liquid if full:  No., but has to hold with both hands Affects ADL's (tying shoes, brushing teeth, etc):  Yes.   (brushing teeth)  10/24/15:  The patient follows up today, just to go over laboratory results.  I started her on primidone last visit for essential tremor, and she has only been on that for a few days, but she states that she is much better.  She has been a little dizzy with it. "I can write and I don't have the shakes like I did."  She has a little nausea.   I did a lab workup for reversible causes of peripheral neuropathy and found that she had evidence of IgG kappa light chains on her urine immunoelectrophoresis.  Her serum electrophoresis also revealed monoclonal protein.  01/16/16 update:  The patient is seen today in follow-up.  I have reviewed records since our last visit.  She is on primidone, 50 mg for essential  tremor.  She thinks that it is usually very helpful and occasionally she will note trouble writing.  She called me at the end of April and stated that she had developed a rash at her waistline and she thought it was from the primidone.  I had doubted that and asked her to follow-up with her primary care physician, which she did.  I got a note from her primary care physician that she was seen on April 27 and told that the rash was from shingles and not from primidone and that her symptoms were mild.  She was started on zara lotion.  She is doing better from that regard.  Last visit, I talked her about the fact that she had an M spike and ended up talking to Dr. Alvy Bimler, who she sees for follicular lymphoma.  Dr. Alvy Bimler thought that this was unrelated to the lymphoma, but said that she would like the patient follow-up in 6 months instead of one year.  05/20/16 update:  Patient is seen today in follow-up.  Last visit, I increased her primidone to 50 mg - 1/2 to 1 tablet in the AM and 1 tablet at night.  She is taking 1 po bid. The patient states that she is doing better and shaking less.   No  SE and doesn't make her sleepy.   No falls but "I have come close."  I have reviewed records since last visit.  She saw Dr. Alvy Bimler on 05/13/2016.  She just recommend repeat myeloma panel in 6 months.  01/21/17 update:  Patient seen today in follow-up.  She is on primidone, 50 mg twice a day.  She states that she thinks that it is much better compared to prior starting primidone but she wonders if we can increase the dose.  She still has some tremor.    She saw Dr. Alvy Bimler in April and I reviewed those records.  Her MGUS has only progressed slightly.  No treatment has been warranted.  She also has a history of B-cell lymphoma and there has been no evidence of significant disease progression in that regard.  07/24/17 update: Patient seen today in follow-up for tremor.  Her primidone was slightly increased last visit so that she  is now taking primidone, 50 mg, 2 tablets in the morning and 1 tablet at night.  She states that tremor increased but relates it to "stress.  I can't handle stress."  States that she has family stress and stress dealing with an estate.  She was so stressed that she cancelled planning the christmas eve meal and that made her even more stressed.  States that in October "my heart got out of rhythm" and "I came out on all sorts of new drugs."  She is now on amiodarone.  Was actually started in July for new onset a-fib.  She has not had f/u with cardiology and state that they were to call her.  She doesn't feel confident in care she was given.   I have reviewed records since our last visit.  Saw Dr. Alvy Bimler in October.  Watchful waiting is recommended for her follicular lymphoma.  Current/Previously tried tremor medications: on atenolol but for BP  Current medications that may exacerbate tremor:  none  Outside reports reviewed: office notes and referral letter/letters.  Allergies  Allergen Reactions  . Amoxicillin Diarrhea and Nausea And Vomiting  . Aspirin Nausea And Vomiting and Other (See Comments)    Heartburn,  Indigestion.  . Benadryl [Diphenhydramine Hcl] Nausea And Vomiting  . Ibuprofen Nausea And Vomiting and Palpitations  . Sulfa Antibiotics Itching and Nausea And Vomiting  . Sulindac     Ineffective for arthritis  . Penicillins Rash    Outpatient Encounter Medications as of 07/24/2017  Medication Sig  . albuterol (PROVENTIL HFA;VENTOLIN HFA) 108 (90 Base) MCG/ACT inhaler Inhale 2 puffs into the lungs every 6 (six) hours as needed for wheezing or shortness of breath.  Marland Kitchen amiodarone (PACERONE) 200 MG tablet Take 1 tablet (200 mg total) by mouth daily.  Marland Kitchen apixaban (ELIQUIS) 2.5 MG TABS tablet Take 1 tablet (2.5 mg total) by mouth 2 (two) times daily.  Marland Kitchen atenolol (TENORMIN) 50 MG tablet Take 50 mg by mouth daily.  . Calcium Carbonate-Vitamin D (CALCIUM-D) 600-400 MG-UNIT TABS Take by mouth  daily.   . chlordiazePOXIDE (LIBRIUM) 10 MG capsule Take 10 mg by mouth 2 (two) times daily as needed for anxiety.   Marland Kitchen dextromethorphan-guaiFENesin (MUCINEX DM) 30-600 MG per 12 hr tablet Take 1 tablet by mouth daily as needed (sinus).   . fluticasone (FLONASE) 50 MCG/ACT nasal spray Place 2 sprays into the nose daily as needed for allergies.   . hydrochlorothiazide (MICROZIDE) 12.5 MG capsule Take 1 capsule (12.5 mg total) by mouth daily.  Marland Kitchen HYDROcodone-acetaminophen (NORCO) 5-325 MG per  tablet Take 1 tablet by mouth every 6 (six) hours as needed for moderate pain.   . hyoscyamine (LEVSIN SL) 0.125 MG SL tablet Place 0.125 mg under the tongue every 4 (four) hours as needed for cramping. Reported on 09/25/2015  . losartan (COZAAR) 50 MG tablet Take 1 tablet (50 mg total) by mouth daily.  . meclizine (ANTIVERT) 50 MG tablet Take 0.5 tablets (25 mg total) by mouth 3 (three) times daily as needed.  . Multiple Vitamin (MULTIVITAMIN) capsule Take 1 capsule by mouth daily.  . nabumetone (RELAFEN) 750 MG tablet Take 750 mg by mouth 2 (two) times daily.  . primidone (MYSOLINE) 50 MG tablet 2 in the morning, 1 in the evening (Patient taking differently: Take 50-100 mg by mouth See admin instructions. Take 2 tablets every morning and take 1 every evening)  . Propylene Glycol (SYSTANE BALANCE) 0.6 % SOLN Apply 1-2 drops to eye 3 (three) times daily as needed (dry eyes.).   Marland Kitchen ranitidine (ZANTAC) 300 MG tablet Take 300 mg by mouth at bedtime as needed for heartburn.   . sertraline (ZOLOFT) 50 MG tablet Take 50 mg by mouth daily.   No facility-administered encounter medications on file as of 07/24/2017.     Past Medical History:  Diagnosis Date  . Allergy   . Cancer ([place])    follicular lymphoma  . COPD (chronic obstructive pulmonary disease) ([place])   . Depression   . DJD (degenerative joint disease)   . Dysuria 09/25/2015  . GERD (gastroesophageal reflux disease)   . Heart murmur    had echo 20 yr ago-not  sure where  . Hx of colonic polyp 10/02/05  . Hypertension   . MGUS (monoclonal gammopathy of unknown significance) 10/24/2015  . MVP (mitral valve prolapse)   . Osteoporosis     Past Surgical History:  Procedure Laterality Date  . APPENDECTOMY    . CATARACT EXTRACTION Bilateral   . COLONOSCOPY    . DILATION AND CURETTAGE OF UTERUS     several  . OVARIAN CYST REMOVAL      Social History   Socioeconomic History  . Marital status: Married    Spouse name: Not on file  . Number of children: Not on file  . Years of education: Not on file  . Highest education level: Not on file  Social Needs  . Financial resource strain: Not on file  . Food insecurity - worry: Not on file  . Food insecurity - inability: Not on file  . Transportation needs - medical: Not on file  . Transportation needs - non-medical: Not on file  Occupational History  . Occupation: Retired  Tobacco Use  . Smoking status: Never Smoker  . Smokeless tobacco: Never Used  Substance and Sexual Activity  . Alcohol use: No    Alcohol/week: 0.0 oz  . Drug use: No  . Sexual activity: Not on file  Other Topics Concern  . Not on file  Social History Narrative   Pt lives with husband who is bipolar and was wounded in [place]. They stay apart much of the time.    Family Status  Relation Name Status  . Mother  Deceased at age 53       strokes, tremor  . Father  Deceased at age 46       strokes  . Brother  Deceased       prostate cancer  . Sister  Deceased       fall/head trauma/stroke  . Sister  Deceased       lung cancer  . Daughter  Alive       healthy, thyroid disease    Review of Systems Some indigestion with certain foods.  A complete 10 system ROS was obtained and was negative apart from what is mentioned.   Objective:   VITALS:   Vitals:   07/24/17 1113  BP: 138/88  Pulse: (!) 56  SpO2: 97%  Weight: 119 lb (54 kg)  Height: 5\' 4"  (1.626 m)   Gen:  Appears stated age and in NAD. HEENT:   Normocephalic, atraumatic. The mucous membranes are moist. The superficial temporal arteries are without ropiness or tenderness. Cardiovascular: Bradycardic.  Regular rhythm. Lungs: Clear to auscultation bilaterally. Neck: There are no carotid bruits noted bilaterally.  NEUROLOGICAL:  Orientation:  The patient is alert and oriented x 3.   Cranial nerves: There is good facial symmetry. Speech is fluent and clear. Soft palate rises symmetrically and there is no tongue deviation. Hearing is intact to conversational tone. Tone: Tone is good throughout. Sensation: Sensation is intact to light touch throughout Coordination:  The patient has no dysdiadichokinesia or dysmetria. Motor: Strength is at least antigravity x 4.  She has trouble with shoulder abduction on the left.   Gait and Station: The patient is able to ambulate without difficulty.  Abnormal movements:  There is mild to mod tremor of outstretched hands that increases with intention.  This is increased from last visit.     Labs:    Chemistry      Component Value Date/Time   NA 140 05/13/2017 0914   K 3.8 05/13/2017 0914   CL 102 02/01/2017 0337   CL 103 10/09/2012 1036   CO2 29 05/13/2017 0914   BUN 24.9 05/13/2017 0914   CREATININE 1.4 (H) 05/13/2017 0914      Component Value Date/Time   CALCIUM 9.4 05/13/2017 0914   ALKPHOS 59 05/13/2017 0914   AST 45 (H) 05/13/2017 0914   ALT 7 05/13/2017 0914   BILITOT 0.37 05/13/2017 0914     Lab Results  Component Value Date   TSH 1.299 01/30/2017    Lab Results  Component Value Date   WBC 4.1 05/13/2017   HGB 10.4 (L) 05/13/2017   HCT 31.1 (L) 05/13/2017   MCV 99.8 05/13/2017   PLT 142 (L) 05/13/2017      Assessment/Plan:   1.  Essential Tremor.  -continue primidone 50 mg - 2 tablet in the AM and one at night.  -was started on amiodarone since our last visit and while stress has greatly increased (which will increase tremor), I do think that the addition of   amiodarone has increased tremor, which occurs in about 1/3 of patients.  Pt doesn't have an appointment with cardiology.  She states that she didn't have a good relationship with cardiologist.  She would like to be referred somewhere else.  I will send her to Dr. Vernell Leep. 2.  Peripheral neuropathy  -lab w/u demonstrated kappa light chains.  Does have h/o follicular lymphoma 3.  MGUS  -Dr. Alvy Bimler doesn't think related to follicular lymphoma and following with her 4.  New onset A-fib in 01/2017  -as above, amiodarone addition is likely responsible for increase in tremor.  Told her not sure that this will be able to be changed 5.  Anxiety  -mostly related to family stress.  No SI/HI.  Decided to hold on meds for right now.  6.  Follow up is anticipated  in the next few months, sooner should new neurologic issues arise.  Much greater than 50% of this visit was spent in counseling and coordinating care.  Total face to face time:  25 min

## 2017-07-24 ENCOUNTER — Telehealth: Payer: Self-pay | Admitting: Neurology

## 2017-07-24 ENCOUNTER — Encounter: Payer: Self-pay | Admitting: Neurology

## 2017-07-24 ENCOUNTER — Ambulatory Visit: Payer: Medicare Other | Admitting: Neurology

## 2017-07-24 VITALS — BP 138/88 | HR 56 | Ht 64.0 in | Wt 119.0 lb

## 2017-07-24 DIAGNOSIS — G251 Drug-induced tremor: Secondary | ICD-10-CM | POA: Diagnosis not present

## 2017-07-24 DIAGNOSIS — F411 Generalized anxiety disorder: Secondary | ICD-10-CM

## 2017-07-24 DIAGNOSIS — I4891 Unspecified atrial fibrillation: Secondary | ICD-10-CM

## 2017-07-24 DIAGNOSIS — G250 Essential tremor: Secondary | ICD-10-CM | POA: Diagnosis not present

## 2017-07-24 NOTE — Telephone Encounter (Signed)
Referral faxed to Dr. Bonney Roussel office at [phone] with confirmation received. They will call patient with new patient cardiology appt.

## 2017-07-24 NOTE — Patient Instructions (Signed)
We will refer you to Dr. Virgina Jock, Cardiologist. If you do not hear from their office please call [phone].

## 2017-08-05 ENCOUNTER — Telehealth: Payer: Self-pay | Admitting: Neurology

## 2017-08-05 ENCOUNTER — Ambulatory Visit: Payer: Medicare Other

## 2017-08-05 NOTE — Telephone Encounter (Signed)
I received a note from [facility] that the patient saw Dr. Einar Gip on July 31, 2017.  He noted that she was severely orthostatic and had a 50-60 mm drop in supine blood pressure.  He did talk about the use of support stockings with her.  He also advised her to stop her antihypertensive medications if she were to get sick for any reason.  He was going to follow her up in 6 weeks for repeat orthostatics.  He did decrease her amiodarone to 100 mg because of tremor.

## 2017-08-12 ENCOUNTER — Ambulatory Visit
Admission: RE | Admit: 2017-08-12 | Discharge: 2017-08-12 | Disposition: A | Discharge status: Home / Self Care | Payer: Medicare Other | Source: Ambulatory Visit | Attending: Family Medicine | Admitting: Family Medicine

## 2017-08-12 DIAGNOSIS — Z1231 Encounter for screening mammogram for malignant neoplasm of breast: Secondary | ICD-10-CM

## 2017-08-13 ENCOUNTER — Ambulatory Visit: Payer: Medicare Other

## 2017-09-02 ENCOUNTER — Other Ambulatory Visit: Payer: Self-pay | Admitting: Neurology

## 2017-09-02 MED ORDER — PRIMIDONE 50 MG PO TABS: ORAL_TABLET | ORAL | 5 refills | Status: DC

## 2017-11-18 ENCOUNTER — Telehealth: Payer: Self-pay | Admitting: Hematology and Oncology

## 2017-11-18 ENCOUNTER — Encounter: Payer: Self-pay | Admitting: Hematology and Oncology

## 2017-11-18 ENCOUNTER — Inpatient Hospital Stay: Payer: Medicare Other

## 2017-11-18 ENCOUNTER — Inpatient Hospital Stay: Payer: Medicare Other | Attending: Hematology and Oncology | Admitting: Hematology and Oncology

## 2017-11-18 DIAGNOSIS — I482 Chronic atrial fibrillation, unspecified: Secondary | ICD-10-CM

## 2017-11-18 DIAGNOSIS — D696 Thrombocytopenia, unspecified: Secondary | ICD-10-CM | POA: Diagnosis not present

## 2017-11-18 DIAGNOSIS — D61818 Other pancytopenia: Secondary | ICD-10-CM | POA: Diagnosis not present

## 2017-11-18 DIAGNOSIS — D539 Nutritional anemia, unspecified: Secondary | ICD-10-CM

## 2017-11-18 DIAGNOSIS — C8290 Follicular lymphoma, unspecified, unspecified site: Secondary | ICD-10-CM

## 2017-11-18 DIAGNOSIS — Z8572 Personal history of non-Hodgkin lymphomas: Secondary | ICD-10-CM

## 2017-11-18 DIAGNOSIS — N183 Chronic kidney disease, stage 3 unspecified: Secondary | ICD-10-CM

## 2017-11-18 DIAGNOSIS — D472 Monoclonal gammopathy: Secondary | ICD-10-CM | POA: Diagnosis not present

## 2017-11-18 DIAGNOSIS — D649 Anemia, unspecified: Secondary | ICD-10-CM | POA: Diagnosis not present

## 2017-11-18 HISTORY — DX: Chronic atrial fibrillation, unspecified: I48.20

## 2017-11-18 LAB — COMPREHENSIVE METABOLIC PANEL
ALK PHOS: 69 U/L (ref 40–150)
ALT: 10 U/L (ref 0–55)
ANION GAP: 7 (ref 3–11)
AST: 18 U/L (ref 5–34)
Albumin: 3.8 g/dL (ref 3.5–5.0)
BUN: 17 mg/dL (ref 7–26)
CALCIUM: 8.9 mg/dL (ref 8.4–10.4)
CHLORIDE: 103 mmol/L (ref 98–109)
CO2: 30 mmol/L — AB (ref 22–29)
Creatinine, Ser: 1.23 mg/dL — ABNORMAL HIGH (ref 0.60–1.10)
GFR calc non Af Amer: 38 mL/min — ABNORMAL LOW (ref >60)
GFR, EST AFRICAN AMERICAN: 44 mL/min — AB (ref >60)
Glucose, Bld: 77 mg/dL (ref 70–140)
Potassium: 3.4 mmol/L — ABNORMAL LOW (ref 3.5–5.1)
SODIUM: 140 mmol/L (ref 136–145)
Total Bilirubin: 0.4 mg/dL (ref 0.2–1.2)
Total Protein: 7.5 g/dL (ref 6.4–8.3)

## 2017-11-18 LAB — CBC WITH DIFFERENTIAL/PLATELET
Basophils Absolute: 0 10*3/uL (ref 0.0–0.1)
Basophils Relative: 1 %
Eosinophils Absolute: 0.1 10*3/uL (ref 0.0–0.5)
Eosinophils Relative: 2 %
HEMATOCRIT: 31.9 % — AB (ref 34.8–46.6)
HEMOGLOBIN: 10.5 g/dL — AB (ref 11.6–15.9)
LYMPHS ABS: 1 10*3/uL (ref 0.9–3.3)
LYMPHS PCT: 33 %
MCH: 32.7 pg (ref 25.1–34.0)
MCHC: 32.9 g/dL (ref 31.5–36.0)
MCV: 99.2 fL (ref 79.5–101.0)
MONO ABS: 0.3 10*3/uL (ref 0.1–0.9)
MONOS PCT: 8 %
NEUTROS ABS: 1.8 10*3/uL (ref 1.5–6.5)
NEUTROS PCT: 56 %
Platelets: 135 10*3/uL — ABNORMAL LOW (ref 145–400)
RBC: 3.22 MIL/uL — ABNORMAL LOW (ref 3.70–5.45)
RDW: 14 % (ref 11.2–14.5)
WBC: 3.2 10*3/uL — ABNORMAL LOW (ref 3.9–10.3)

## 2017-11-18 NOTE — Assessment & Plan Note (Signed)
Repeat MGUS is pending but overall no evidence of life-threatening organ damage that would warrant the start of treatment. I will call her next week with test results I recommend close observation and repeat myeloma panel in 6 months

## 2017-11-18 NOTE — Telephone Encounter (Signed)
Gave patient AVs and calendar of upcoming October appointments.

## 2017-11-18 NOTE — Progress Notes (Signed)
[place] OFFICE PROGRESS NOTE  Patient Care Team: Gaynelle Arabian, MD as PCP - General (Family Medicine) Heath Lark, MD as Consulting Physician (Hematology and Oncology)  ASSESSMENT & PLAN:  History of B-cell lymphoma [name] has no detectable signs of lymphoma recurrence on exam Will continue surveillance observation  MGUS (monoclonal gammopathy of unknown significance) Repeat MGUS is pending but overall no evidence of life-threatening organ damage that would warrant the start of treatment. I will call her next week with test results I recommend close observation and repeat myeloma panel in 6 months  Pancytopenia, acquired [facility]) [name] has mild chronic anemia and intermittent thrombocytopenia Recommend observation only for now I plan to check serum vitamin B12 level in her next visit  CKD (chronic kidney disease) stage 3, GFR 30-59 ml/min (HCC) [name] has chronic kidney disease stage III which I do not believe is related to multiple myeloma [name] will continue medication management and risk factor modification  Chronic atrial fibrillation ([place]) [name] has persistent chronic atrial fibrillation, currently on medical management Her blood pressure is high but could be attributed to anxiety [name] has chronic bilateral leg edema which is stable [name] is currently on chronic anticoagulation therapy, managed through her cardiologist [name] denies bleeding complications.   Orders Placed This Encounter  Procedures  . CBC with Differential/Platelet    Standing Status:   Future    Standing Expiration Date:   12/23/2018  . Comprehensive metabolic panel    Standing Status:   Future    Standing Expiration Date:   12/23/2018  . Vitamin B12    Standing Status:   Future    Standing Expiration Date:   12/23/2018  . Kappa/lambda light chains    Standing Status:   Future    Standing Expiration Date:   12/23/2018  . Multiple Myeloma Panel (SPEP&IFE w/QIG)    Standing Status:   Future    Standing  Expiration Date:   12/23/2018    INTERVAL HISTORY: Please see below for problem oriented charting. [name] returns for further follow-up [name] has mild leg swelling [name] denies recent bone pain No new lymphadenopathy Her appetite is stable without recent weight change The patient denies any recent signs or symptoms of bleeding such as spontaneous epistaxis, hematuria or hematochezia.  SUMMARY OF ONCOLOGIC HISTORY:  [name] was transferred to my care after her prior physician has left.  I reviewed the patient's records extensive and collaborated the history with the patient. Summary of her history is as follows: This patient was diagnosed with lymphoma in 2013. CT scan of the abdomen and pelvis on 10/02/2011 that noted shotty bilateral inguinal nodes and a hiatal hernia, otherwise negative. The liver, spleen and pancreas were unremarkable. There was no mesenteric or retroperitoneal adenopathy. [name] was referred to Dr. Neldon Mc who, on 11/18/2011, performed an excisional biopsy of right inguinal node. Surgical pathology revealed atypical follicular proliferation which was positive for BCl-2 and CD20. The overall impression was that this was clearly involvement of follicular lymphoma. (Case number [id number]). [name] was initiated on Rituxan treatments, 375 mg/m2=600 mg q wk x 4, From 01/30/2012-02/20/12. PET scan on 12/23/11 had shown axillary, subpectoral and inguinal lymphadenopathy. A PET scan on 02/19/12 did show a complete metabolic response to therapy with only 1 focus of very mild hypermetabolic activity in the right axilla. [name] is now on close surveillance.   REVIEW OF SYSTEMS:   Constitutional: Denies fevers, chills or abnormal weight loss Eyes: Denies blurriness of vision Ears, nose,  mouth, throat, and face: Denies mucositis or sore throat Respiratory: Denies cough, dyspnea or wheezes Cardiovascular: Denies palpitation, chest discomfort  Gastrointestinal:  Denies nausea, heartburn or  change in bowel habits Skin: Denies abnormal skin rashes Lymphatics: Denies new lymphadenopathy or easy bruising Neurological:Denies numbness, tingling or new weaknesses Behavioral/Psych: Mood is stable, no new changes  All other systems were reviewed with the patient and are negative.  I have reviewed the past medical history, past surgical history, social history and family history with the patient and they are unchanged from previous note.  ALLERGIES:  is allergic to amoxicillin; aspirin; benadryl [diphenhydramine hcl]; ibuprofen; sulfa antibiotics; sulindac; and penicillins.  MEDICATIONS:  Current Outpatient Medications  Medication Sig Dispense Refill  . albuterol (PROVENTIL HFA;VENTOLIN HFA) 108 (90 Base) MCG/ACT inhaler Inhale 2 puffs into the lungs every 6 (six) hours as needed for wheezing or shortness of breath. 1 Inhaler 2  . amiodarone (PACERONE) 200 MG tablet Take 1 tablet (200 mg total) by mouth daily. 30 tablet 2  . apixaban (ELIQUIS) 2.5 MG TABS tablet Take 1 tablet (2.5 mg total) by mouth 2 (two) times daily. 60 tablet 2  . atenolol (TENORMIN) 50 MG tablet Take 50 mg by mouth daily.    . Calcium Carbonate-Vitamin D (CALCIUM-D) 600-400 MG-UNIT TABS Take by mouth daily.     . chlordiazePOXIDE (LIBRIUM) 10 MG capsule Take 10 mg by mouth 2 (two) times daily as needed for anxiety.     Marland Kitchen dextromethorphan-guaiFENesin (MUCINEX DM) 30-600 MG per 12 hr tablet Take 1 tablet by mouth daily as needed (sinus).     . fluticasone (FLONASE) 50 MCG/ACT nasal spray Place 2 sprays into the nose daily as needed for allergies.     . hydrochlorothiazide (MICROZIDE) 12.5 MG capsule Take 1 capsule (12.5 mg total) by mouth daily. 30 capsule 2  . HYDROcodone-acetaminophen (NORCO) 5-325 MG per tablet Take 1 tablet by mouth every 6 (six) hours as needed for moderate pain.     . hyoscyamine (LEVSIN SL) 0.125 MG SL tablet Place 0.125 mg under the tongue every 4 (four) hours as needed for cramping. Reported  on 09/25/2015    . losartan (COZAAR) 50 MG tablet Take 1 tablet (50 mg total) by mouth daily. 30 tablet 2  . meclizine (ANTIVERT) 50 MG tablet Take 0.5 tablets (25 mg total) by mouth 3 (three) times daily as needed. 30 tablet 0  . Multiple Vitamin (MULTIVITAMIN) capsule Take 1 capsule by mouth daily.    . nabumetone (RELAFEN) 750 MG tablet Take 750 mg by mouth 2 (two) times daily.    . primidone (MYSOLINE) 50 MG tablet 2 in the morning, 1 in the evening 90 tablet 5  . Propylene Glycol (SYSTANE BALANCE) 0.6 % SOLN Apply 1-2 drops to eye 3 (three) times daily as needed (dry eyes.).     Marland Kitchen ranitidine (ZANTAC) 300 MG tablet Take 300 mg by mouth at bedtime as needed for heartburn.     . sertraline (ZOLOFT) 50 MG tablet Take 50 mg by mouth daily.     No current facility-administered medications for this visit.     PHYSICAL EXAMINATION: ECOG PERFORMANCE STATUS: 2 - Symptomatic, <50% confined to bed  Vitals:   11/18/17 1023  BP: (!) 191/92  Pulse: 60  Resp: 18  Temp: 98.3 F (36.8 C)  SpO2: 98%   Filed Weights   11/18/17 1023  Weight: 116 lb 1.6 oz (52.7 kg)    GENERAL:alert, no distress and comfortable.  [name] looks  thin and frail SKIN: skin color, texture, turgor are normal, no rashes or significant lesions EYES: normal, Conjunctiva are pink and non-injected, sclera clear OROPHARYNX:no exudate, no erythema and lips, buccal mucosa, and tongue normal  NECK: supple, thyroid normal size, non-tender, without nodularity LYMPH:  no palpable lymphadenopathy in the cervical, axillary or inguinal LUNGS: clear to auscultation and percussion with normal breathing effort HEART: [name] has a regular rate and rhythm, mild bilateral lower extremity edema ABDOMEN:abdomen soft, non-tender and normal bowel sounds Musculoskeletal:no cyanosis of digits and no clubbing  NEURO: alert & oriented x 3 with fluent speech, no focal motor/sensory deficits.  [name] has tremors LABORATORY DATA:  I have reviewed the data  as listed    Component Value Date/Time   NA 140 11/18/2017 0852   NA 140 05/13/2017 0914   K 3.4 (L) 11/18/2017 0852   K 3.8 05/13/2017 0914   CL 103 11/18/2017 0852   CL 103 10/09/2012 1036   CO2 30 (H) 11/18/2017 0852   CO2 29 05/13/2017 0914   GLUCOSE 77 11/18/2017 0852   GLUCOSE 86 05/13/2017 0914   GLUCOSE 89 10/09/2012 1036   BUN 17 11/18/2017 0852   BUN 24.9 05/13/2017 0914   CREATININE 1.23 (H) 11/18/2017 0852   CREATININE 1.4 (H) 05/13/2017 0914   CALCIUM 8.9 11/18/2017 0852   CALCIUM 9.4 05/13/2017 0914   PROT 7.5 11/18/2017 0852   PROT 7.3 05/13/2017 0914   PROT 7.7 05/13/2017 0914   ALBUMIN 3.8 11/18/2017 0852   ALBUMIN 3.6 05/13/2017 0914   AST 18 11/18/2017 0852   AST 45 (H) 05/13/2017 0914   ALT 10 11/18/2017 0852   ALT 7 05/13/2017 0914   ALKPHOS 69 11/18/2017 0852   ALKPHOS 59 05/13/2017 0914   BILITOT 0.4 11/18/2017 0852   BILITOT 0.37 05/13/2017 0914   GFRNONAA 38 (L) 11/18/2017 0852   GFRAA 44 (L) 11/18/2017 0852    No results found for: SPEP, UPEP  Lab Results  Component Value Date   WBC 3.2 (L) 11/18/2017   NEUTROABS 1.8 11/18/2017   HGB 10.5 (L) 11/18/2017   HCT 31.9 (L) 11/18/2017   MCV 99.2 11/18/2017   PLT 135 (L) 11/18/2017      Chemistry      Component Value Date/Time   NA 140 11/18/2017 0852   NA 140 05/13/2017 0914   K 3.4 (L) 11/18/2017 0852   K 3.8 05/13/2017 0914   CL 103 11/18/2017 0852   CL 103 10/09/2012 1036   CO2 30 (H) 11/18/2017 0852   CO2 29 05/13/2017 0914   BUN 17 11/18/2017 0852   BUN 24.9 05/13/2017 0914   CREATININE 1.23 (H) 11/18/2017 0852   CREATININE 1.4 (H) 05/13/2017 0914      Component Value Date/Time   CALCIUM 8.9 11/18/2017 0852   CALCIUM 9.4 05/13/2017 0914   ALKPHOS 69 11/18/2017 0852   ALKPHOS 59 05/13/2017 0914   AST 18 11/18/2017 0852   AST 45 (H) 05/13/2017 0914   ALT 10 11/18/2017 0852   ALT 7 05/13/2017 0914   BILITOT 0.4 11/18/2017 0852   BILITOT 0.37 05/13/2017 0914      All  questions were answered. The patient knows to call the clinic with any problems, questions or concerns. No barriers to learning was detected.  I spent 25 minutes counseling the patient face to face. The total time spent in the appointment was 30 minutes and more than 50% was on counseling and review of test results  Heath Lark,  MD 11/18/2017 1:04 PM

## 2017-11-18 NOTE — Assessment & Plan Note (Signed)
She has chronic kidney disease stage III which I do not believe is related to multiple myeloma She will continue medication management and risk factor modification

## 2017-11-18 NOTE — Assessment & Plan Note (Signed)
She has no detectable signs of lymphoma recurrence on exam Will continue surveillance observation 

## 2017-11-18 NOTE — Assessment & Plan Note (Signed)
She has persistent chronic atrial fibrillation, currently on medical management Her blood pressure is high but could be attributed to anxiety She has chronic bilateral leg edema which is stable She is currently on chronic anticoagulation therapy, managed through her cardiologist She denies bleeding complications.

## 2017-11-18 NOTE — Assessment & Plan Note (Signed)
She has mild chronic anemia and intermittent thrombocytopenia Recommend observation only for now I plan to check serum vitamin B12 level in her next visit

## 2017-11-19 LAB — KAPPA/LAMBDA LIGHT CHAINS
KAPPA, LAMDA LIGHT CHAIN RATIO: 15.69 — AB (ref 0.26–1.65)
Kappa free light chain: 133.4 mg/L — ABNORMAL HIGH (ref 3.3–19.4)
Lambda free light chains: 8.5 mg/L (ref 5.7–26.3)

## 2017-11-20 LAB — MULTIPLE MYELOMA PANEL, SERUM
ALBUMIN/GLOB SERPL: 1.2 (ref 0.7–1.7)
ALPHA2 GLOB SERPL ELPH-MCNC: 0.5 g/dL (ref 0.4–1.0)
Albumin SerPl Elph-Mcnc: 3.8 g/dL (ref 2.9–4.4)
Alpha 1: 0.2 g/dL (ref 0.0–0.4)
B-Globulin SerPl Elph-Mcnc: 0.7 g/dL (ref 0.7–1.3)
Gamma Glob SerPl Elph-Mcnc: 1.7 g/dL (ref 0.4–1.8)
Globulin, Total: 3.2 g/dL (ref 2.2–3.9)
IGG (IMMUNOGLOBIN G), SERUM: 2062 mg/dL — AB (ref 700–1600)
IGM (IMMUNOGLOBULIN M), SRM: 11 mg/dL — AB (ref 26–217)
IgA: 29 mg/dL — ABNORMAL LOW (ref 64–422)
M Protein SerPl Elph-Mcnc: 1.4 g/dL — ABNORMAL HIGH
Total Protein ELP: 7 g/dL (ref 6.0–8.5)

## 2017-11-21 ENCOUNTER — Telehealth: Payer: Self-pay

## 2017-11-21 ENCOUNTER — Ambulatory Visit: Payer: Medicare Other | Admitting: Neurology

## 2017-11-21 NOTE — Telephone Encounter (Addendum)
Received the following per Dr Alvy Bimler.  Attempted to reach pt twice, no answer, rang many times,  and no option to leave a VM.  Will try to call later today.    ----- Message from Heath Lark, MD sent at 11/21/2017  8:01 AM EDT ----- Regarding: labs are ok PLs let her know labs are stable Plan to see her in October

## 2017-11-24 ENCOUNTER — Telehealth: Payer: Self-pay | Admitting: *Deleted

## 2017-11-24 NOTE — Telephone Encounter (Signed)
Notified that labs are stable. Plan to see Dr Alvy Bimler in October

## 2017-12-17 NOTE — Progress Notes (Signed)
Subjective:   [name] was seen in consultation in the movement disorder clinic at the request of Gaynelle Arabian, MD.  The evaluation is for tremor.  The patient is a 84 y.o. right handed female with a history of tremor.  Pt reports that she has had tremor for about 10 years and remembers having trouble at work when she was typing and she thought that was the problem.  Tremor has increased over the last few years.  Tremor increases with stress.  The right hand is worse than the L but "it is moving to the L."  States that she feels tremor on the inside and in the legs States that she is having trouble writing because of the tremor, but not because of micrographia.  States that she just wanted a "pill" for the tremor.  There is a family hx of tremor in her mother, her sister and her niece.    Tremor characteristics: Affected by caffeine:  No. (only 1/2 cup coffee in AM) Affected by alcohol: doesn't drink any alcohol Affected by stress:  Yes.   Affected by fatigue:  Yes.   Spills soup if on spoon:  No., but has to hold it steady or hold the wrist Spills glass of liquid if full:  No., but has to hold with both hands Affects ADL's (tying shoes, brushing teeth, etc):  Yes.   (brushing teeth)  10/24/15:  The patient follows up today, just to go over laboratory results.  I started her on primidone last visit for essential tremor, and she has only been on that for a few days, but she states that she is much better.  She has been a little dizzy with it. "I can write and I don't have the shakes like I did."  She has a little nausea.   I did a lab workup for reversible causes of peripheral neuropathy and found that she had evidence of IgG kappa light chains on her urine immunoelectrophoresis.  Her serum electrophoresis also revealed monoclonal protein.  01/16/16 update:  The patient is seen today in follow-up.  I have reviewed records since our last visit.  She is on primidone, 50 mg for essential  tremor.  She thinks that it is usually very helpful and occasionally she will note trouble writing.  She called me at the end of April and stated that she had developed a rash at her waistline and she thought it was from the primidone.  I had doubted that and asked her to follow-up with her primary care physician, which she did.  I got a note from her primary care physician that she was seen on April 27 and told that the rash was from shingles and not from primidone and that her symptoms were mild.  She was started on zara lotion.  She is doing better from that regard.  Last visit, I talked her about the fact that she had an M spike and ended up talking to Dr. Alvy Bimler, who she sees for follicular lymphoma.  Dr. Alvy Bimler thought that this was unrelated to the lymphoma, but said that she would like the patient follow-up in 6 months instead of one year.  05/20/16 update:  Patient is seen today in follow-up.  Last visit, I increased her primidone to 50 mg - 1/2 to 1 tablet in the AM and 1 tablet at night.  She is taking 1 po bid. The patient states that she is doing better and shaking less.   No  SE and doesn't make her sleepy.   No falls but "I have come close."  I have reviewed records since last visit.  She saw Dr. Alvy Bimler on 05/13/2016.  She just recommend repeat myeloma panel in 6 months.  01/21/17 update:  Patient seen today in follow-up.  She is on primidone, 50 mg twice a day.  She states that she thinks that it is much better compared to prior starting primidone but she wonders if we can increase the dose.  She still has some tremor.    She saw Dr. Alvy Bimler in April and I reviewed those records.  Her MGUS has only progressed slightly.  No treatment has been warranted.  She also has a history of B-cell lymphoma and there has been no evidence of significant disease progression in that regard.  07/24/17 update: Patient seen today in follow-up for tremor.  Her primidone was slightly increased last visit so that she  is now taking primidone, 50 mg, 2 tablets in the morning and 1 tablet at night.  She states that tremor increased but relates it to "stress.  I can't handle stress."  States that she has family stress and stress dealing with an estate.  She was so stressed that she cancelled planning the christmas eve meal and that made her even more stressed.  States that in October "my heart got out of rhythm" and "I came out on all sorts of new drugs."  She is now on amiodarone.  Was actually started in July for new onset a-fib.  She has not had f/u with cardiology and state that they were to call her.  She doesn't feel confident in care she was given.   I have reviewed records since our last visit.  Saw Dr. Alvy Bimler in October.  Watchful waiting is recommended for her follicular lymphoma.  12/19/17 update: Patient is seen today for follow-up.  She is on primidone, 50 mg, 2 tablets in the morning and 1 at night.  She is still noting tremor.   She did see Dr. Einar Gip since our last visit and patient was severely orthostatic this was going to be following up with her.  He did decrease her amiodarone to 100 mg from 200 mg because of tremor.   She tells me she was told to take it prn and is taking it every other day.  She is on eliquis.   She is still seeing Dr. Alvy Bimler and those records are reviewed from her April 30 appointment.  She does admit to intermittent vertigo and occasional intermittent weakness.  She also has intermittent swelling of the legs.  When asked about falls, she admits to a few in the yard where the ground is unlevel.  Current/Previously tried tremor medications: on atenolol but for BP  Current medications that may exacerbate tremor:  none  Outside reports reviewed: office notes and referral letter/letters.  Allergies  Allergen Reactions  . Amoxicillin Diarrhea and Nausea And Vomiting  . Aspirin Nausea And Vomiting and Other (See Comments)    Heartburn,  Indigestion.  . Benadryl [Diphenhydramine Hcl]  Nausea And Vomiting  . Ibuprofen Nausea And Vomiting and Palpitations  . Sulfa Antibiotics Itching and Nausea And Vomiting  . Sulindac     Ineffective for arthritis  . Penicillins Rash    Outpatient Encounter Medications as of 12/19/2017  Medication Sig  . albuterol (PROVENTIL HFA;VENTOLIN HFA) 108 (90 Base) MCG/ACT inhaler Inhale 2 puffs into the lungs every 6 (six) hours as needed for wheezing or  shortness of breath.  Marland Kitchen amiodarone (PACERONE) 200 MG tablet Take 1 tablet (200 mg total) by mouth daily.  Marland Kitchen apixaban (ELIQUIS) 2.5 MG TABS tablet Take 1 tablet (2.5 mg total) by mouth 2 (two) times daily.  Marland Kitchen atenolol (TENORMIN) 50 MG tablet Take 50 mg by mouth daily.  . Calcium Carbonate-Vitamin D (CALCIUM-D) 600-400 MG-UNIT TABS Take by mouth daily.   . chlordiazePOXIDE (LIBRIUM) 10 MG capsule Take 10 mg by mouth 2 (two) times daily as needed for anxiety.   Marland Kitchen dextromethorphan-guaiFENesin (MUCINEX DM) 30-600 MG per 12 hr tablet Take 1 tablet by mouth daily as needed (sinus).   . fluticasone (FLONASE) 50 MCG/ACT nasal spray Place 2 sprays into the nose daily as needed for allergies.   . hydrochlorothiazide (MICROZIDE) 12.5 MG capsule Take 1 capsule (12.5 mg total) by mouth daily.  Marland Kitchen HYDROcodone-acetaminophen (NORCO) 5-325 MG per tablet Take 1 tablet by mouth every 6 (six) hours as needed for moderate pain.   . hyoscyamine (LEVSIN SL) 0.125 MG SL tablet Place 0.125 mg under the tongue every 4 (four) hours as needed for cramping. Reported on 09/25/2015  . losartan (COZAAR) 50 MG tablet Take 1 tablet (50 mg total) by mouth daily.  . meclizine (ANTIVERT) 50 MG tablet Take 0.5 tablets (25 mg total) by mouth 3 (three) times daily as needed.  . Multiple Vitamin (MULTIVITAMIN) capsule Take 1 capsule by mouth daily.  . nabumetone (RELAFEN) 750 MG tablet Take 750 mg by mouth 2 (two) times daily.  . primidone (MYSOLINE) 50 MG tablet 2 in the morning, 1 in the evening  . Propylene Glycol (SYSTANE BALANCE) 0.6 %  SOLN Apply 1-2 drops to eye 3 (three) times daily as needed (dry eyes.).   Marland Kitchen ranitidine (ZANTAC) 300 MG tablet Take 300 mg by mouth at bedtime as needed for heartburn.   . sertraline (ZOLOFT) 50 MG tablet Take 50 mg by mouth daily.   No facility-administered encounter medications on file as of 12/19/2017.     Past Medical History:  Diagnosis Date  . Allergy   . Cancer ([place])    follicular lymphoma  . COPD (chronic obstructive pulmonary disease) ([place])   . Depression   . DJD (degenerative joint disease)   . Dysuria 09/25/2015  . GERD (gastroesophageal reflux disease)   . Heart murmur    had echo 20 yr ago-not sure where  . Hx of colonic polyp 10/02/05  . Hypertension   . MGUS (monoclonal gammopathy of unknown significance) 10/24/2015  . MVP (mitral valve prolapse)   . Osteoporosis     Past Surgical History:  Procedure Laterality Date  . APPENDECTOMY    . CATARACT EXTRACTION Bilateral   . COLONOSCOPY    . DILATION AND CURETTAGE OF UTERUS     several  . OVARIAN CYST REMOVAL      Social History   Socioeconomic History  . Marital status: Married    Spouse name: Not on file  . Number of children: Not on file  . Years of education: Not on file  . Highest education level: Not on file  Occupational History  . Occupation: Retired  Scientific laboratory technician  . Financial resource strain: Not on file  . Food insecurity:    Worry: Not on file    Inability: Not on file  . Transportation needs:    Medical: Not on file    Non-medical: Not on file  Tobacco Use  . Smoking status: Never Smoker  . Smokeless tobacco: Never Used  Substance and Sexual Activity  . Alcohol use: No    Alcohol/week: 0.0 oz  . Drug use: No  . Sexual activity: Not on file  Lifestyle  . Physical activity:    Days per week: Not on file    Minutes per session: Not on file  . Stress: Not on file  Relationships  . Social connections:    Talks on phone: Not on file    Gets together: Not on file    Attends religious  service: Not on file    Active member of club or organization: Not on file    Attends meetings of clubs or organizations: Not on file    Relationship status: Not on file  . Intimate partner violence:    Fear of current or ex partner: Not on file    Emotionally abused: Not on file    Physically abused: Not on file    Forced sexual activity: Not on file  Other Topics Concern  . Not on file  Social History Narrative   Pt lives with husband who is bipolar and was wounded in [place]. They stay apart much of the time.    Family Status  Relation Name Status  . Mother  Deceased at age 47       strokes, tremor  . Father  Deceased at age 48       strokes  . Brother  Deceased       prostate cancer  . Sister  Deceased       fall/head trauma/stroke  . Sister  Deceased       lung cancer  . Daughter  Alive       healthy, thyroid disease    Review of Systems Some indigestion with certain foods.  A complete 10 system ROS was obtained and was negative apart from what is mentioned.   Objective:   VITALS:   Vitals:   12/19/17 0813  BP: 110/76  Pulse: (!) 58  SpO2: 96%  Weight: 117 lb (53.1 kg)  Height: 5\' 3"  (1.6 m)   GEN:  The patient appears stated age and is in NAD. HEENT:  Normocephalic, atraumatic.  The mucous membranes are moist. The superficial temporal arteries are without ropiness or tenderness. CV:  Bradycardic.  regular Lungs:  CTAB Neck/HEME:  There are no carotid bruits bilaterally.  Neurological examination:  Orientation: The patient is alert and oriented x3. Cranial nerves: There is good facial symmetry. The speech is fluent and clear. Soft palate rises symmetrically and there is no tongue deviation. Hearing is intact to conversational tone. Sensation: Sensation is intact to light touch throughout Motor: Strength is at least antigravity x 4  Movement examination: Tone: There is normal Abnormal movements: there is tremor of the outstretched hands.  There is rare  rest tremor.  Archimedes spirals are worse Coordination:  There is no decremation with RAM's, with any form of RAMS, including alternating supination and pronation of the forearm, hand opening and closing, finger taps, heel taps and toe taps. Gait and Station: The patient walks with her cane.  She is fairly steady with that.     Labs:    Chemistry      Component Value Date/Time   NA 140 11/18/2017 0852   NA 140 05/13/2017 0914   K 3.4 (L) 11/18/2017 0852   K 3.8 05/13/2017 0914   CL 103 11/18/2017 0852   CL 103 10/09/2012 1036   CO2 30 (H) 11/18/2017 0852   CO2 29 05/13/2017  0914   BUN 17 11/18/2017 0852   BUN 24.9 05/13/2017 0914   CREATININE 1.23 (H) 11/18/2017 0852   CREATININE 1.4 (H) 05/13/2017 0914      Component Value Date/Time   CALCIUM 8.9 11/18/2017 0852   CALCIUM 9.4 05/13/2017 0914   ALKPHOS 69 11/18/2017 0852   ALKPHOS 59 05/13/2017 0914   AST 18 11/18/2017 0852   AST 45 (H) 05/13/2017 0914   ALT 10 11/18/2017 0852   ALT 7 05/13/2017 0914   BILITOT 0.4 11/18/2017 0852   BILITOT 0.37 05/13/2017 0914     Lab Results  Component Value Date   TSH 1.299 01/30/2017    Lab Results  Component Value Date   WBC 3.2 (L) 11/18/2017   HGB 10.5 (L) 11/18/2017   HCT 31.9 (L) 11/18/2017   MCV 99.2 11/18/2017   PLT 135 (L) 11/18/2017      Assessment/Plan:   1.  Essential Tremor.  -was going to increase her primidone but realized she was on eliquis.  Discussed with her that these medications cannot be used together.  Will d/c the primidone and ask cardiology if eliquis can be changed to pradaxa.  Will call her next week.  -working with cardiology as amiodarone made her tremor worse.  Cardiology decreased amiodarone to 100 mg daily 2.  Peripheral neuropathy  -lab w/u demonstrated kappa light chains.  Does have h/o follicular lymphoma 3.  MGUS  -Dr. Alvy Bimler doesn't think related to follicular lymphoma and following with her 4.  New onset A-fib in 01/2017  -as  above, amiodarone addition is likely responsible for increase in tremor.  Told her not sure that this will be able to be changed  -on eliquis 5.  Anxiety  -mostly related to family stress.  No SI/HI.  Decided to hold on meds for right now.  6.  F/u will depend on above.  Will call her next week

## 2017-12-19 ENCOUNTER — Ambulatory Visit: Payer: Medicare Other | Admitting: Neurology

## 2017-12-19 ENCOUNTER — Encounter: Payer: Self-pay | Admitting: Neurology

## 2017-12-19 ENCOUNTER — Telehealth: Payer: Self-pay | Admitting: Neurology

## 2017-12-19 VITALS — BP 110/76 | HR 58 | Ht 63.0 in | Wt 117.0 lb

## 2017-12-19 DIAGNOSIS — G250 Essential tremor: Secondary | ICD-10-CM | POA: Diagnosis not present

## 2017-12-19 NOTE — Telephone Encounter (Signed)
-----   Message from [place], DO sent at 12/19/2017  8:36 AM EDT ----- Check with Dr. Einar Gip (cardiology) and see if any possiblity that eliquis can be changed to pradaxa.  I took her off of primidone today b/c of the interaction but would love her to be back on that as tremor is increasing

## 2017-12-19 NOTE — Patient Instructions (Addendum)
Decrease primidone to 50 mg - 1 tablet twice per day for a week, then once per day for a week and then stop it.  I will call your cardiologist about your eliquis and see if it can be changed to something else.

## 2017-12-19 NOTE — Telephone Encounter (Signed)
Left message with April with Dr. Irven Shelling office who will speak with him and let us know.

## 2017-12-22 ENCOUNTER — Telehealth: Payer: Self-pay | Admitting: Neurology

## 2017-12-22 NOTE — Telephone Encounter (Signed)
Patient called back after this phone call and stated she was coming off Primidone.

## 2017-12-22 NOTE — Telephone Encounter (Signed)
Pt in regards to her blood medication and taking primidone, the two medications pt stated do not like each other, please advise

## 2017-12-22 NOTE — Telephone Encounter (Signed)
Spoke with patient and she states she was not able to go to weekly checks for INR that are required for coumadin. She will remain off Primidone for now while taking Eliquis.

## 2017-12-22 NOTE — Telephone Encounter (Signed)
Then she will need to make sure she comes off of the primidone.  I believe I gave her that schedule.  Doesn't need f/u here at this point then

## 2017-12-22 NOTE — Telephone Encounter (Signed)
April called back and states patient does not want to switch medications at this time.

## 2017-12-22 NOTE — Telephone Encounter (Signed)
[name] with [facility] Cardiology states Dr. Einar Gip told her that patient would have to be changed to Coumadin. They will reach out to patient and discuss medication options and see what she wants to do. If she decides to change medication she will let us know.   Dr. Carles Collet Juluis Rainier.

## 2018-03-19 ENCOUNTER — Telehealth: Payer: Self-pay | Admitting: Neurology

## 2018-03-19 NOTE — Telephone Encounter (Signed)
Patient saw Binnie Kand, nurse practitioner, at [facility] cardiology and notes are received and reviewed.  Date of service was March 10, 2018.  Patient was doing a bit better on propranolol for tremor.  Note states that if patient needs to go back on primidone, they would consider changing to Pradaxa.

## 2018-05-19 ENCOUNTER — Encounter: Payer: Self-pay | Admitting: Hematology and Oncology

## 2018-05-19 ENCOUNTER — Telehealth: Payer: Self-pay | Admitting: Hematology and Oncology

## 2018-05-19 ENCOUNTER — Inpatient Hospital Stay (HOSPITAL_BASED_OUTPATIENT_CLINIC_OR_DEPARTMENT_OTHER): Payer: Medicare Other | Admitting: Hematology and Oncology

## 2018-05-19 ENCOUNTER — Inpatient Hospital Stay: Payer: Medicare Other | Attending: Hematology and Oncology

## 2018-05-19 DIAGNOSIS — E876 Hypokalemia: Secondary | ICD-10-CM | POA: Insufficient documentation

## 2018-05-19 DIAGNOSIS — D61818 Other pancytopenia: Secondary | ICD-10-CM

## 2018-05-19 DIAGNOSIS — I10 Essential (primary) hypertension: Secondary | ICD-10-CM

## 2018-05-19 DIAGNOSIS — M7989 Other specified soft tissue disorders: Secondary | ICD-10-CM

## 2018-05-19 DIAGNOSIS — I129 Hypertensive chronic kidney disease with stage 1 through stage 4 chronic kidney disease, or unspecified chronic kidney disease: Secondary | ICD-10-CM

## 2018-05-19 DIAGNOSIS — Z79899 Other long term (current) drug therapy: Secondary | ICD-10-CM | POA: Insufficient documentation

## 2018-05-19 DIAGNOSIS — D472 Monoclonal gammopathy: Secondary | ICD-10-CM

## 2018-05-19 DIAGNOSIS — I482 Chronic atrial fibrillation, unspecified: Secondary | ICD-10-CM

## 2018-05-19 DIAGNOSIS — N183 Chronic kidney disease, stage 3 unspecified: Secondary | ICD-10-CM

## 2018-05-19 DIAGNOSIS — R197 Diarrhea, unspecified: Secondary | ICD-10-CM

## 2018-05-19 DIAGNOSIS — Z8572 Personal history of non-Hodgkin lymphomas: Secondary | ICD-10-CM

## 2018-05-19 DIAGNOSIS — D539 Nutritional anemia, unspecified: Secondary | ICD-10-CM

## 2018-05-19 LAB — CBC WITH DIFFERENTIAL/PLATELET
ABS IMMATURE GRANULOCYTES: 0 10*3/uL (ref 0.00–0.07)
BASOS PCT: 1 %
Basophils Absolute: 0 10*3/uL (ref 0.0–0.1)
EOS ABS: 0.1 10*3/uL (ref 0.0–0.5)
Eosinophils Relative: 1 %
HCT: 31.4 % — ABNORMAL LOW (ref 36.0–46.0)
Hemoglobin: 10.2 g/dL — ABNORMAL LOW (ref 12.0–15.0)
IMMATURE GRANULOCYTES: 0 %
Lymphocytes Relative: 31 %
Lymphs Abs: 1.4 10*3/uL (ref 0.7–4.0)
MCH: 32.2 pg (ref 26.0–34.0)
MCHC: 32.5 g/dL (ref 30.0–36.0)
MCV: 99.1 fL (ref 80.0–100.0)
Monocytes Absolute: 0.4 10*3/uL (ref 0.1–1.0)
Monocytes Relative: 10 %
NEUTROS ABS: 2.6 10*3/uL (ref 1.7–7.7)
NEUTROS PCT: 57 %
Platelets: 140 10*3/uL — ABNORMAL LOW (ref 150–400)
RBC: 3.17 MIL/uL — AB (ref 3.87–5.11)
RDW: 13.3 % (ref 11.5–15.5)
WBC: 4.5 10*3/uL (ref 4.0–10.5)
nRBC: 0 % (ref 0.0–0.2)

## 2018-05-19 LAB — COMPREHENSIVE METABOLIC PANEL
ALBUMIN: 3.4 g/dL — AB (ref 3.5–5.0)
ALT: 11 U/L (ref 0–44)
ANION GAP: 7 (ref 5–15)
AST: 23 U/L (ref 15–41)
Alkaline Phosphatase: 71 U/L (ref 38–126)
BILIRUBIN TOTAL: 0.8 mg/dL (ref 0.3–1.2)
BUN: 31 mg/dL — ABNORMAL HIGH (ref 8–23)
CO2: 29 mmol/L (ref 22–32)
Calcium: 9.1 mg/dL (ref 8.9–10.3)
Chloride: 105 mmol/L (ref 98–111)
Creatinine, Ser: 1.59 mg/dL — ABNORMAL HIGH (ref 0.44–1.00)
GFR calc Af Amer: 32 mL/min — ABNORMAL LOW (ref >60)
GFR, EST NON AFRICAN AMERICAN: 28 mL/min — AB (ref >60)
Glucose, Bld: 94 mg/dL (ref 70–99)
POTASSIUM: 3.3 mmol/L — AB (ref 3.5–5.1)
Sodium: 141 mmol/L (ref 135–145)
TOTAL PROTEIN: 7.3 g/dL (ref 6.5–8.1)

## 2018-05-19 LAB — VITAMIN B12: Vitamin B-12: 725 pg/mL (ref 180–914)

## 2018-05-19 NOTE — Assessment & Plan Note (Signed)
She continues to have elevated blood pressure but it could be contributed to anxiety I recommend close follow-up with her primary care doctor for medical management

## 2018-05-19 NOTE — Assessment & Plan Note (Signed)
Repeat MGUS is pending but overall no evidence of life-threatening organ damage that would warrant the start of treatment. I will call her next week with test results I recommend close observation and repeat myeloma panel in 9 months

## 2018-05-19 NOTE — Progress Notes (Signed)
[facility] OFFICE PROGRESS NOTE  Patient Care Team: Gaynelle Arabian, MD as PCP - General (Family Medicine) Heath Lark, MD as Consulting Physician (Hematology and Oncology)  ASSESSMENT & PLAN:  History of B-cell lymphoma She has no detectable signs of lymphoma recurrence on exam Will continue surveillance observation  MGUS (monoclonal gammopathy of unknown significance) Repeat MGUS is pending but overall no evidence of life-threatening organ damage that would warrant the start of treatment. I will call her next week with test results I recommend close observation and repeat myeloma panel in 9 months  CKD (chronic kidney disease) stage 3, GFR 30-59 ml/min (HCC) She has mild acute on chronic renal failure secondary to recent diarrhea and dehydration I encouraged her to increase oral fluid intake as tolerated  Systolic hypertension She continues to have elevated blood pressure but it could be contributed to anxiety I recommend close follow-up with her primary care doctor for medical management  Hypokalemia She has mild hypokalemia likely due to recent diarrhea I recommend potassium rich diet   Orders Placed This Encounter  Procedures  . CBC with Differential/Platelet    Standing Status:   Future    Standing Expiration Date:   06/23/2019  . Comprehensive metabolic panel    Standing Status:   Future    Standing Expiration Date:   06/23/2019  . Kappa/lambda light chains    Standing Status:   Future    Standing Expiration Date:   06/23/2019  . Multiple Myeloma Panel (SPEP&IFE w/QIG)    Standing Status:   Future    Standing Expiration Date:   06/23/2019    INTERVAL HISTORY: Please see below for problem oriented charting. She returns for further follow-up.  She is more than 1 hour late today She denies recent infection, fever or chills No recent falls No recent bone pain Her appetite is stable No new lymphadenopathy She complained of recent mild diarrhea She  has chronic leg swelling, stable  SUMMARY OF ONCOLOGIC HISTORY:  [name] was transferred to my care after her prior physician has left.  I reviewed the patient's records extensive and collaborated the history with the patient. Summary of her history is as follows: This patient was diagnosed with lymphoma in 2013. CT scan of the abdomen and pelvis on 10/02/2011 that noted shotty bilateral inguinal nodes and a hiatal hernia, otherwise negative. The liver, spleen and pancreas were unremarkable. There was no mesenteric or retroperitoneal adenopathy. She was referred to Dr. Neldon Mc who, on 11/18/2011, performed an excisional biopsy of right inguinal node. Surgical pathology revealed atypical follicular proliferation which was positive for BCl-2 and CD20. The overall impression was that this was clearly involvement of follicular lymphoma. (Case number SZ [id number]). She was initiated on Rituxan treatments, 375 mg/m2=600 mg q wk x 4, From 01/30/2012-02/20/12. PET scan on 12/23/11 had shown axillary, subpectoral and inguinal lymphadenopathy. A PET scan on 02/19/12 did show a complete metabolic response to therapy with only 1 focus of very mild hypermetabolic activity in the right axilla. She is now on close surveillance.  She was also found to have IgG MGUS, being monitored only  REVIEW OF SYSTEMS:   Constitutional: Denies fevers, chills or abnormal weight loss Eyes: Denies blurriness of vision Ears, nose, mouth, throat, and face: Denies mucositis or sore throat Respiratory: Denies cough, dyspnea or wheezes Cardiovascular: Denies palpitation, chest discomfort or lower extremity swelling Skin: Denies abnormal skin rashes Lymphatics: Denies new lymphadenopathy  Neurological:Denies numbness, tingling or new weaknesses Behavioral/Psych: Mood  is stable, no new changes  All other systems were reviewed with the patient and are negative.  I have reviewed the past medical history, past surgical  history, social history and family history with the patient and they are unchanged from previous note.  ALLERGIES:  is allergic to amoxicillin; aspirin; benadryl [diphenhydramine hcl]; ibuprofen; sulfa antibiotics; sulindac; and penicillins.  MEDICATIONS:  Current Outpatient Medications  Medication Sig Dispense Refill  . albuterol (PROVENTIL HFA;VENTOLIN HFA) 108 (90 Base) MCG/ACT inhaler Inhale 2 puffs into the lungs every 6 (six) hours as needed for wheezing or shortness of breath. 1 Inhaler 2  . amiodarone (PACERONE) 200 MG tablet Take 1 tablet (200 mg total) by mouth daily. 30 tablet 2  . apixaban (ELIQUIS) 2.5 MG TABS tablet Take 1 tablet (2.5 mg total) by mouth 2 (two) times daily. 60 tablet 2  . atenolol (TENORMIN) 50 MG tablet Take 50 mg by mouth daily.    . Calcium Carbonate-Vitamin D (CALCIUM-D) 600-400 MG-UNIT TABS Take by mouth daily.     . chlordiazePOXIDE (LIBRIUM) 10 MG capsule Take 10 mg by mouth 2 (two) times daily as needed for anxiety.     Marland Kitchen dextromethorphan-guaiFENesin (MUCINEX DM) 30-600 MG per 12 hr tablet Take 1 tablet by mouth daily as needed (sinus).     . fluticasone (FLONASE) 50 MCG/ACT nasal spray Place 2 sprays into the nose daily as needed for allergies.     . hydrochlorothiazide (MICROZIDE) 12.5 MG capsule Take 1 capsule (12.5 mg total) by mouth daily. 30 capsule 2  . HYDROcodone-acetaminophen (NORCO) 5-325 MG per tablet Take 1 tablet by mouth every 6 (six) hours as needed for moderate pain.     . hyoscyamine (LEVSIN SL) 0.125 MG SL tablet Place 0.125 mg under the tongue every 4 (four) hours as needed for cramping. Reported on 09/25/2015    . losartan (COZAAR) 50 MG tablet Take 1 tablet (50 mg total) by mouth daily. 30 tablet 2  . meclizine (ANTIVERT) 50 MG tablet Take 0.5 tablets (25 mg total) by mouth 3 (three) times daily as needed. 30 tablet 0  . Multiple Vitamin (MULTIVITAMIN) capsule Take 1 capsule by mouth daily.    . nabumetone (RELAFEN) 750 MG tablet Take  750 mg by mouth 2 (two) times daily.    . primidone (MYSOLINE) 50 MG tablet 2 in the morning, 1 in the evening 90 tablet 5  . Propylene Glycol (SYSTANE BALANCE) 0.6 % SOLN Apply 1-2 drops to eye 3 (three) times daily as needed (dry eyes.).     Marland Kitchen ranitidine (ZANTAC) 300 MG tablet Take 300 mg by mouth at bedtime as needed for heartburn.     . sertraline (ZOLOFT) 50 MG tablet Take 50 mg by mouth daily.     No current facility-administered medications for this visit.     PHYSICAL EXAMINATION: ECOG PERFORMANCE STATUS: 1 - Symptomatic but completely ambulatory  Vitals:   05/19/18 1053  BP: (!) 178/158  Pulse: (!) 58  Resp: 17  Temp: 97.7 F (36.5 C)  SpO2: 100%   Filed Weights   05/19/18 1053  Weight: 107 lb 12.8 oz (48.9 kg)    GENERAL:alert, no distress and comfortable SKIN: skin color, texture, turgor are normal, no rashes or significant lesions.  Noted minor skin bruises EYES: normal, Conjunctiva are pink and non-injected, sclera clear OROPHARYNX:no exudate, no erythema and lips, buccal mucosa, and tongue normal  NECK: supple, thyroid normal size, non-tender, without nodularity LYMPH:  no palpable lymphadenopathy in the cervical,  axillary or inguinal LUNGS: clear to auscultation and percussion with normal breathing effort HEART: regular rate & rhythm and no murmurs with moderate bilateral lower extremity edema ABDOMEN:abdomen soft, non-tender and normal bowel sounds Musculoskeletal:no cyanosis of digits and no clubbing  NEURO: alert & oriented x 3 with fluent speech, no focal motor/sensory deficits  LABORATORY DATA:  I have reviewed the data as listed    Component Value Date/Time   NA 141 05/19/2018 1009   NA 140 05/13/2017 0914   K 3.3 (L) 05/19/2018 1009   K 3.8 05/13/2017 0914   CL 105 05/19/2018 1009   CL 103 10/09/2012 1036   CO2 29 05/19/2018 1009   CO2 29 05/13/2017 0914   GLUCOSE 94 05/19/2018 1009   GLUCOSE 86 05/13/2017 0914   GLUCOSE 89 10/09/2012 1036    BUN 31 (H) 05/19/2018 1009   BUN 24.9 05/13/2017 0914   CREATININE 1.59 (H) 05/19/2018 1009   CREATININE 1.4 (H) 05/13/2017 0914   CALCIUM 9.1 05/19/2018 1009   CALCIUM 9.4 05/13/2017 0914   PROT 7.3 05/19/2018 1009   PROT 7.3 05/13/2017 0914   PROT 7.7 05/13/2017 0914   ALBUMIN 3.4 (L) 05/19/2018 1009   ALBUMIN 3.6 05/13/2017 0914   AST 23 05/19/2018 1009   AST 45 (H) 05/13/2017 0914   ALT 11 05/19/2018 1009   ALT 7 05/13/2017 0914   ALKPHOS 71 05/19/2018 1009   ALKPHOS 59 05/13/2017 0914   BILITOT 0.8 05/19/2018 1009   BILITOT 0.37 05/13/2017 0914   GFRNONAA 28 (L) 05/19/2018 1009   GFRAA 32 (L) 05/19/2018 1009    No results found for: SPEP, UPEP  Lab Results  Component Value Date   WBC 4.5 05/19/2018   NEUTROABS 2.6 05/19/2018   HGB 10.2 (L) 05/19/2018   HCT 31.4 (L) 05/19/2018   MCV 99.1 05/19/2018   PLT 140 (L) 05/19/2018      Chemistry      Component Value Date/Time   NA 141 05/19/2018 1009   NA 140 05/13/2017 0914   K 3.3 (L) 05/19/2018 1009   K 3.8 05/13/2017 0914   CL 105 05/19/2018 1009   CL 103 10/09/2012 1036   CO2 29 05/19/2018 1009   CO2 29 05/13/2017 0914   BUN 31 (H) 05/19/2018 1009   BUN 24.9 05/13/2017 0914   CREATININE 1.59 (H) 05/19/2018 1009   CREATININE 1.4 (H) 05/13/2017 0914      Component Value Date/Time   CALCIUM 9.1 05/19/2018 1009   CALCIUM 9.4 05/13/2017 0914   ALKPHOS 71 05/19/2018 1009   ALKPHOS 59 05/13/2017 0914   AST 23 05/19/2018 1009   AST 45 (H) 05/13/2017 0914   ALT 11 05/19/2018 1009   ALT 7 05/13/2017 0914   BILITOT 0.8 05/19/2018 1009   BILITOT 0.37 05/13/2017 0914     All questions were answered. The patient knows to call the clinic with any problems, questions or concerns. No barriers to learning was detected.  I spent 15 minutes counseling the patient face to face. The total time spent in the appointment was 20 minutes and more than 50% was on counseling and review of test results  Heath Lark,  MD 05/19/2018 11:01 AM

## 2018-05-19 NOTE — Assessment & Plan Note (Signed)
She has mild hypokalemia likely due to recent diarrhea I recommend potassium rich diet

## 2018-05-19 NOTE — Assessment & Plan Note (Signed)
She has mild acute on chronic renal failure secondary to recent diarrhea and dehydration I encouraged her to increase oral fluid intake as tolerated

## 2018-05-19 NOTE — Assessment & Plan Note (Signed)
She has no detectable signs of lymphoma recurrence on exam Will continue surveillance observation

## 2018-05-19 NOTE — Telephone Encounter (Signed)
Gave patient avs and calendar.   °

## 2018-05-20 LAB — KAPPA/LAMBDA LIGHT CHAINS
KAPPA, LAMDA LIGHT CHAIN RATIO: 18.31 — AB (ref 0.26–1.65)
Kappa free light chain: 163 mg/L — ABNORMAL HIGH (ref 3.3–19.4)
LAMDA FREE LIGHT CHAINS: 8.9 mg/L (ref 5.7–26.3)

## 2018-05-21 LAB — MULTIPLE MYELOMA PANEL, SERUM
ALBUMIN/GLOB SERPL: 1.1 (ref 0.7–1.7)
ALPHA 1: 0.3 g/dL (ref 0.0–0.4)
Albumin SerPl Elph-Mcnc: 3.6 g/dL (ref 2.9–4.4)
Alpha2 Glob SerPl Elph-Mcnc: 0.6 g/dL (ref 0.4–1.0)
B-Globulin SerPl Elph-Mcnc: 0.7 g/dL (ref 0.7–1.3)
Gamma Glob SerPl Elph-Mcnc: 1.7 g/dL (ref 0.4–1.8)
Globulin, Total: 3.4 g/dL (ref 2.2–3.9)
IGM (IMMUNOGLOBULIN M), SRM: 11 mg/dL — AB (ref 26–217)
IgA: 32 mg/dL — ABNORMAL LOW (ref 64–422)
IgG (Immunoglobin G), Serum: 1961 mg/dL — ABNORMAL HIGH (ref 700–1600)
M Protein SerPl Elph-Mcnc: 1.6 g/dL — ABNORMAL HIGH
TOTAL PROTEIN ELP: 7 g/dL (ref 6.0–8.5)

## 2018-05-25 ENCOUNTER — Telehealth: Payer: Self-pay

## 2018-05-25 NOTE — Telephone Encounter (Signed)
She called and left a message to call her.   Called back and left a message to call the office.

## 2018-05-25 NOTE — Telephone Encounter (Signed)
Called back and given appt date and times in July 2020. She verbalized understanding.

## 2018-06-22 ENCOUNTER — Telehealth: Payer: Self-pay | Admitting: *Deleted

## 2018-06-22 NOTE — Telephone Encounter (Signed)
-----   Message from Heath Lark, MD sent at 06/22/2018  7:14 AM EST ----- Regarding: myeloma labs are stable pls call and let her know labs are stable from October Has she seen PCP to get her BP checked?

## 2018-06-22 NOTE — Telephone Encounter (Signed)
Left message with note below 

## 2018-08-07 ENCOUNTER — Other Ambulatory Visit: Payer: Self-pay | Admitting: Family Medicine

## 2018-08-07 ENCOUNTER — Ambulatory Visit
Admission: RE | Admit: 2018-08-07 | Discharge: 2018-08-07 | Disposition: A | Discharge status: Home / Self Care | Payer: Medicare Other | Source: Ambulatory Visit | Attending: Family Medicine | Admitting: Family Medicine

## 2018-08-07 DIAGNOSIS — M546 Pain in thoracic spine: Secondary | ICD-10-CM

## 2018-08-07 DIAGNOSIS — R102 Pelvic and perineal pain: Secondary | ICD-10-CM

## 2018-08-21 IMAGING — CR DG CHEST 2V
2 series · 2 of 2 positions shown · non-contrast
Comparison: Chest radiograph dated 01/08/2017 and chest CT dated
11/12/2012

CLINICAL DATA: 89-year-old female with chest pain.

EXAM:
CHEST  2 VIEW

[chest pa]
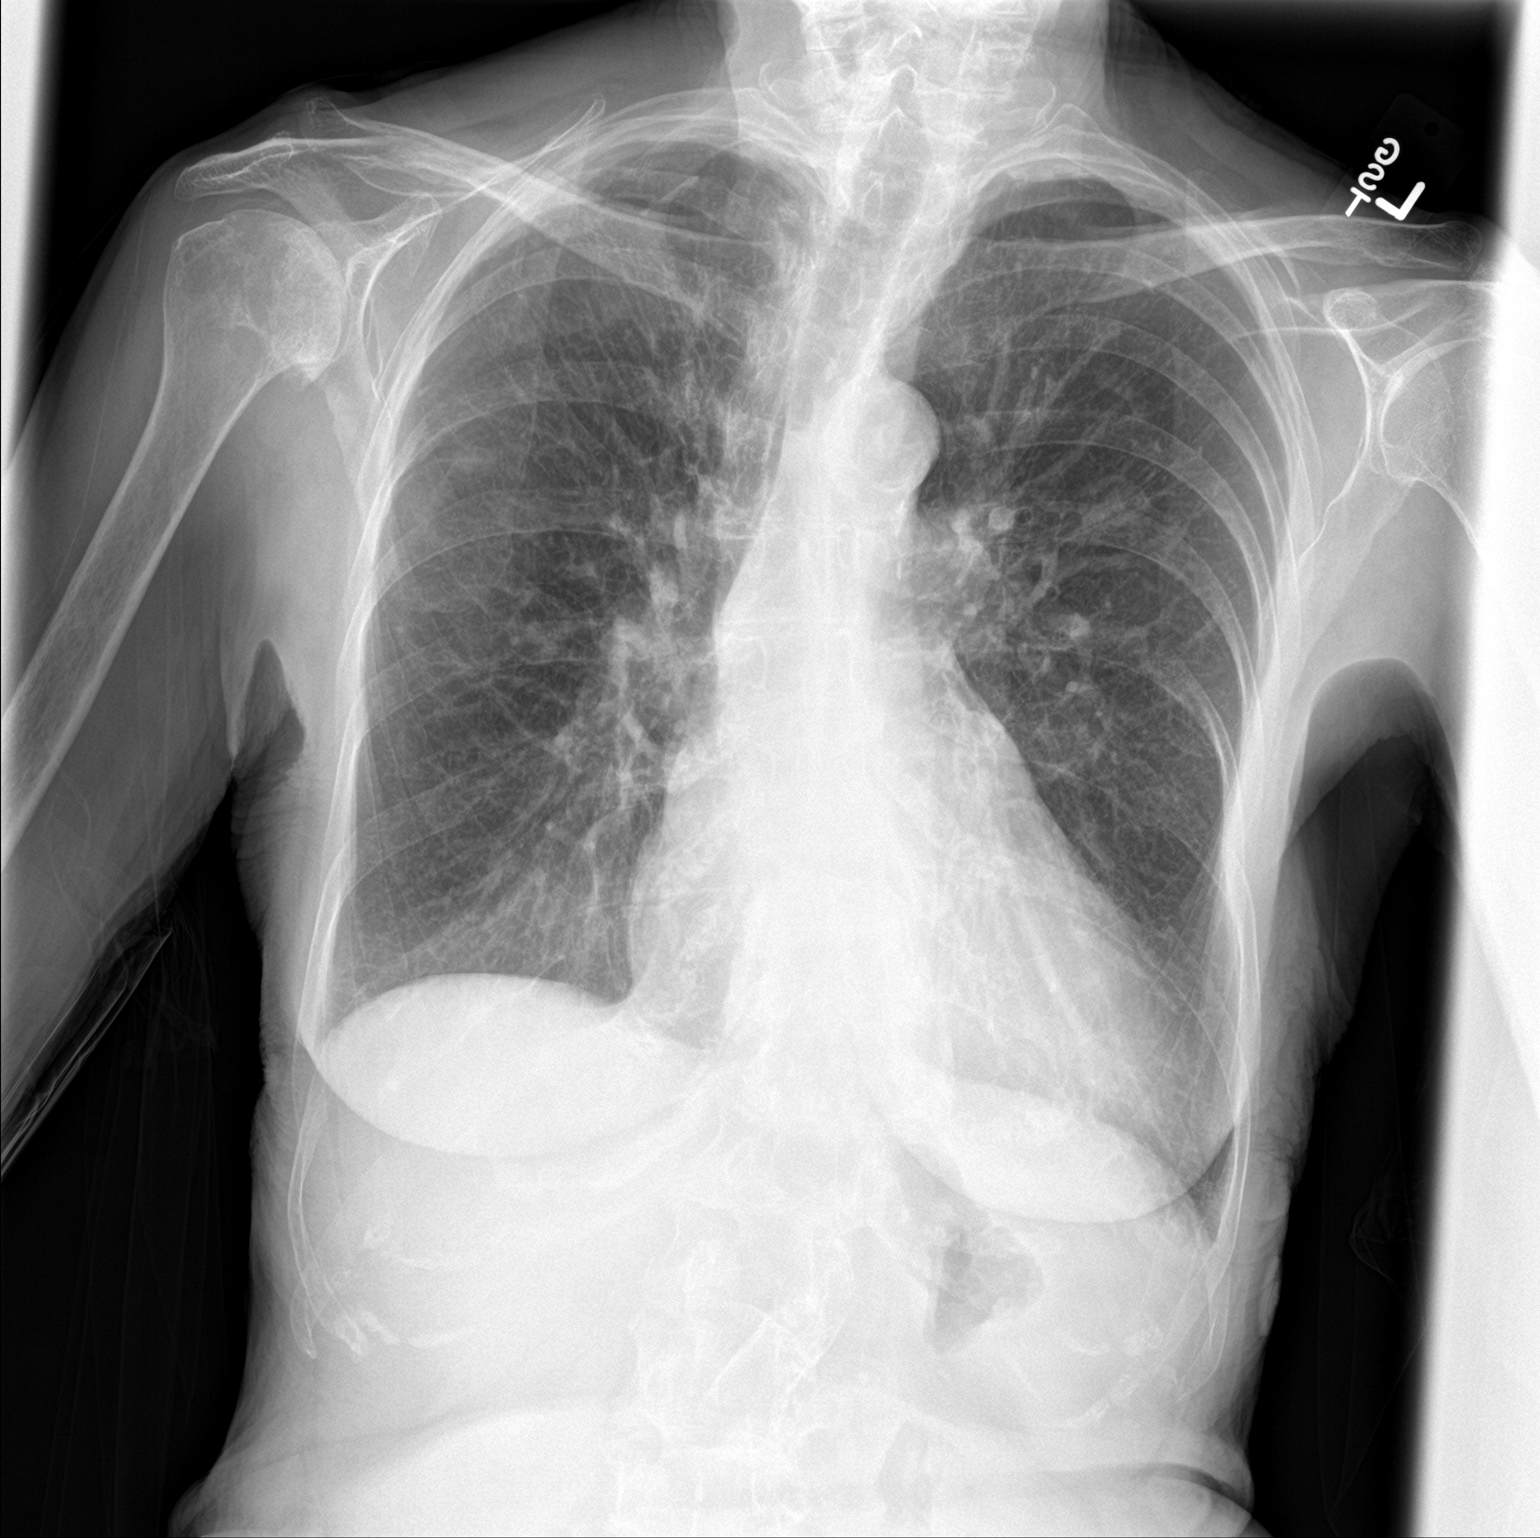

[chest lat]
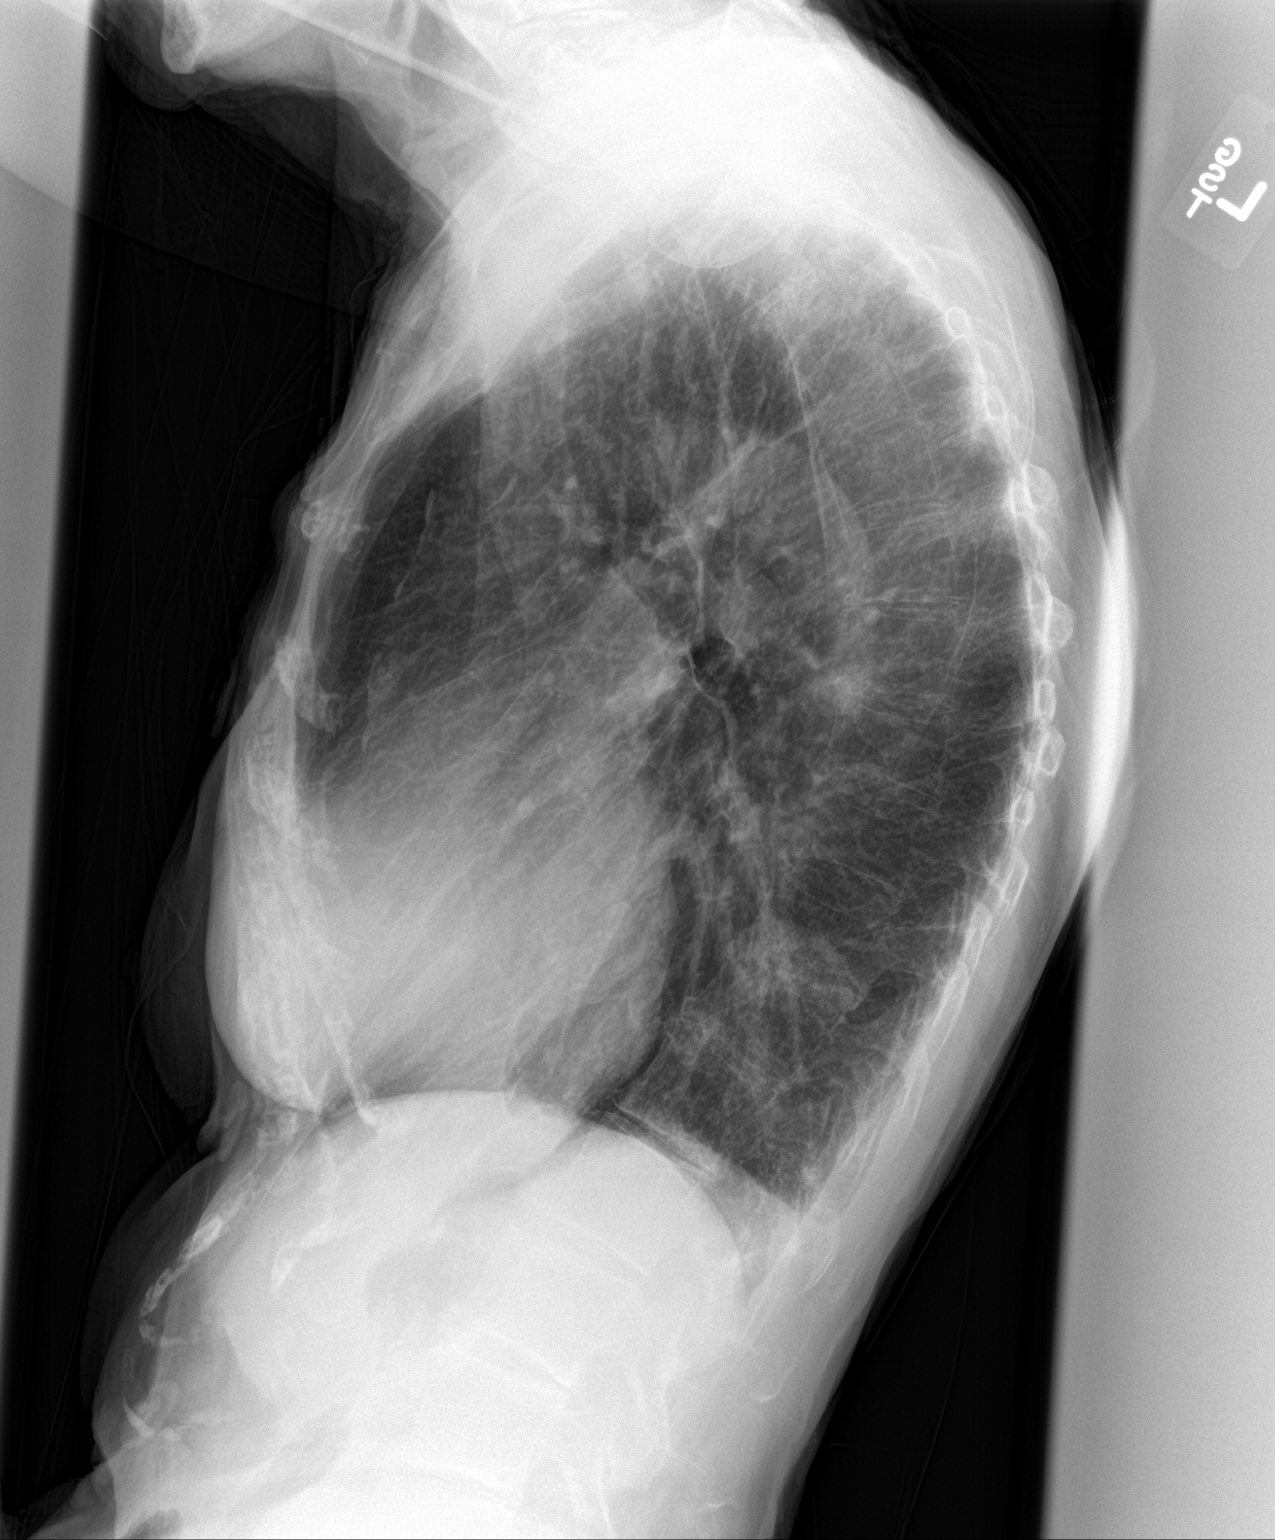

[2 of 2 positions shown; findings below may reference images not displayed]

FINDINGS: There is emphysematous changes of the lungs. No focal consolidation,
pleural effusion, or pneumothorax. A 9 mm nodular density in the
right upper lung field may be artifactual. CT may provide better
evaluation. Top-normal cardiac silhouette. Osteopenia with
degenerative changes of the spine and shoulders. No acute osseous
pathology. Atherosclerotic calcification of the aorta.
IMPRESSION: 1. No acute cardiopulmonary process.
2. Artifact versus a 9 mm right upper lobe nodule. CT may provide
better evaluation if clinically indicated.

## 2018-09-01 ENCOUNTER — Encounter: Payer: Self-pay | Admitting: Cardiology

## 2018-09-01 DIAGNOSIS — I951 Orthostatic hypotension: Secondary | ICD-10-CM | POA: Insufficient documentation

## 2018-09-01 NOTE — Progress Notes (Signed)
Subjective:   @Patient  ID@: [name], female    DOB: 1926/05/14, 85 y.o.   MRN: [id number]  Gaynelle Arabian, MD:  No chief complaint on file.   HPI: [name]  is a 85 y.o. female  with History of B-cell lymphoma without detectable signs of recurrence, history of CKD stage 3, and pancytopenia, history of essential tremors, essential hypertension, GERD, orthostatic hypotension, venous insufficiency, paroxysmal atrial fibrillation, and degenerative joint disease.  Patient is here on 6 month office visit. Reports that she has been having an episode of profound fatigue 2 weeks ago that she states "felt like my heart wasn't beating right" and improved with drinking coffee. She has not had any reoccurrence. She has also episodes of falling due to losing her balance. No syncope. Has dizziness with sudden position changes.   Patient was followed by Dr. Carles Collet for management of tremors. She was previously on primodone; however, this was changed to propanolol due to interaction with primodone with Eliquis. Tremors have improved with propanolol.   She denies any bleeding diathesis with anticoagulation.   She reports that her husband lives in a separate part of the house than she does.   Past Medical History:  Diagnosis Date  . Allergy   . Cancer ([place])    follicular lymphoma  . Chronic atrial fibrillation 11/18/2017  . COPD (chronic obstructive pulmonary disease) ([place])   . Depression   . DJD (degenerative joint disease)   . Dysuria 09/25/2015  . GERD (gastroesophageal reflux disease)   . Heart murmur    had echo 20 yr ago-not sure where  . Hx of colonic polyp 10/02/05  . Hypertension   . MGUS (monoclonal gammopathy of unknown significance) 10/24/2015  . MVP (mitral valve prolapse)   . Osteoporosis     Past Surgical History:  Procedure Laterality Date  . APPENDECTOMY    . CATARACT EXTRACTION Bilateral   . COLONOSCOPY    . DILATION AND CURETTAGE OF UTERUS     several  .  OVARIAN CYST REMOVAL      Family History  Problem Relation Age of Onset  . Heart murmur Mother   . Stroke Mother 7  . Stroke Father 53    Social History   Socioeconomic History  . Marital status: Married    Spouse name: Not on file  . Number of children: Not on file  . Years of education: Not on file  . Highest education level: Not on file  Occupational History  . Occupation: Retired  Scientific laboratory technician  . Financial resource strain: Not on file  . Food insecurity:    Worry: Not on file    Inability: Not on file  . Transportation needs:    Medical: Not on file    Non-medical: Not on file  Tobacco Use  . Smoking status: Never Smoker  . Smokeless tobacco: Never Used  Substance and Sexual Activity  . Alcohol use: No    Alcohol/week: 0.0 standard drinks  . Drug use: No  . Sexual activity: Not on file  Lifestyle  . Physical activity:    Days per week: Not on file    Minutes per session: Not on file  . Stress: Not on file  Relationships  . Social connections:    Talks on phone: Not on file    Gets together: Not on file    Attends religious service: Not on file    Active member of club or organization: Not on  file    Attends meetings of clubs or organizations: Not on file    Relationship status: Not on file  . Intimate partner violence:    Fear of current or ex partner: Not on file    Emotionally abused: Not on file    Physically abused: Not on file    Forced sexual activity: Not on file  Other Topics Concern  . Not on file  Social History Narrative   Pt lives with husband who is bipolar and was wounded in [place]. They stay apart much of the time.    Current Outpatient Medications on File Prior to Visit  Medication Sig Dispense Refill  . albuterol (PROVENTIL HFA;VENTOLIN HFA) 108 (90 Base) MCG/ACT inhaler Inhale 2 puffs into the lungs every 6 (six) hours as needed for wheezing or shortness of breath. 1 Inhaler 2  . amiodarone (PACERONE) 200 MG tablet Take 1 tablet (200  mg total) by mouth daily. 30 tablet 2  . apixaban (ELIQUIS) 2.5 MG TABS tablet Take 1 tablet (2.5 mg total) by mouth 2 (two) times daily. 60 tablet 2  . atenolol (TENORMIN) 50 MG tablet Take 50 mg by mouth daily.    . Calcium Carbonate-Vitamin D (CALCIUM-D) 600-400 MG-UNIT TABS Take by mouth daily.     . chlordiazePOXIDE (LIBRIUM) 10 MG capsule Take 10 mg by mouth 2 (two) times daily as needed for anxiety.     Marland Kitchen dextromethorphan-guaiFENesin (MUCINEX DM) 30-600 MG per 12 hr tablet Take 1 tablet by mouth daily as needed (sinus).     . fluticasone (FLONASE) 50 MCG/ACT nasal spray Place 2 sprays into the nose daily as needed for allergies.     . hydrochlorothiazide (MICROZIDE) 12.5 MG capsule Take 1 capsule (12.5 mg total) by mouth daily. 30 capsule 2  . HYDROcodone-acetaminophen (NORCO) 5-325 MG per tablet Take 1 tablet by mouth every 6 (six) hours as needed for moderate pain.     . hyoscyamine (LEVSIN SL) 0.125 MG SL tablet Place 0.125 mg under the tongue every 4 (four) hours as needed for cramping. Reported on 09/25/2015    . losartan (COZAAR) 50 MG tablet Take 1 tablet (50 mg total) by mouth daily. 30 tablet 2  . meclizine (ANTIVERT) 50 MG tablet Take 0.5 tablets (25 mg total) by mouth 3 (three) times daily as needed. 30 tablet 0  . Multiple Vitamin (MULTIVITAMIN) capsule Take 1 capsule by mouth daily.    . nabumetone (RELAFEN) 750 MG tablet Take 750 mg by mouth 2 (two) times daily.    . primidone (MYSOLINE) 50 MG tablet 2 in the morning, 1 in the evening 90 tablet 5  . Propylene Glycol (SYSTANE BALANCE) 0.6 % SOLN Apply 1-2 drops to eye 3 (three) times daily as needed (dry eyes.).     Marland Kitchen ranitidine (ZANTAC) 300 MG tablet Take 300 mg by mouth at bedtime as needed for heartburn.     . sertraline (ZOLOFT) 50 MG tablet Take 50 mg by mouth daily.     No current facility-administered medications on file prior to visit.      Review of Systems  Constitution: Positive for malaise/fatigue (episode of  profound fatigue). Negative for decreased appetite, weight gain and weight loss.  Eyes: Negative for visual disturbance.  Cardiovascular: Positive for leg swelling. Negative for chest pain, claudication, dyspnea on exertion, orthopnea, palpitations and syncope.  Respiratory: Negative for hemoptysis and wheezing.   Endocrine: Negative for cold intolerance and heat intolerance.  Hematologic/Lymphatic: Does not bruise/bleed easily.  Skin:  Negative for nail changes.  Musculoskeletal: Positive for falls. Negative for muscle weakness and myalgias.  Gastrointestinal: Negative for abdominal pain, change in bowel habit, nausea and vomiting.  Neurological: Positive for tremors (improved with propanolol). Negative for difficulty with concentration, dizziness, focal weakness and headaches.  Psychiatric/Behavioral: Negative for altered mental status and suicidal ideas.  All other systems reviewed and are negative.      Objective:     Blood pressure 112/75, pulse (!) 51, height 5\' 4"  (1.626 m), weight 103 lb (46.7 kg), SpO2 94 %.   Echo 01/23/2017: Normal LV size with EF 60-65%. Moderate diastolic dysfunction.Normal RV size and systolic function. Moderate to severe biatrial enlargement. Mild MR.     Physical Exam  Constitutional: She is oriented to person, place, and time. Vital signs are normal. She appears well-developed and well-nourished.  HENT:  Head: Normocephalic and atraumatic.  Neck: Normal range of motion.  Cardiovascular: Normal rate, regular rhythm, normal heart sounds and intact distal pulses.  Extrasystoles are present.  Pulses:      Dorsalis pedis pulses are 2+ on the right side and 2+ on the left side.       Posterior tibial pulses are 2+ on the right side and 2+ on the left side.  Bilateral non-pitting edema  Pulmonary/Chest: Effort normal and breath sounds normal. No accessory muscle usage. No respiratory distress.  Abdominal: Soft. Bowel sounds are normal.  Musculoskeletal:  Normal range of motion.  Neurological: She is alert and oriented to person, place, and time.  Skin: Skin is warm, dry and intact.  Vitals reviewed.      Assessment & Recommendations:   1. Paroxysmal atrial fibrillation [facility]) Patient reports one episode concerning for atrial fibrillation 2 weeks ago. She is noted to have wandering atrial pacemaker today on EKG, but is asymptomatic today. Continue with current medical therapy. Encouraged her to contact me if she has any reoccurrence of symptoms and will place her on monitor to exclude that she has any significant bradycardia or AV block. She is tolerating anticoagulation well, continue with low dose Eliquis.  2. Essential tremor Improved with propanolol and will continue for now. She is following Dr. Carles Collet as needed for now.   3. Essential hypertension Well controlled with current medical therapy. Continue the same.  4. Orthostatic hypotension She is unable to wear support stockings that would also possibly help with her falls. Encouraged slow position changes to prevent falls.   Patient may benefit from PT to help with gait instability and falls if this related to deconditioning. I have asked her to discuss with her PCP. Patient originally did not bring her prescriptions with her today and was confused about her medications; therefore, she later brought her meds in for reconciliation. I stressed the importance of bringing her medications to each appt. I will see her back in 6 months for follow up or sooner if needed.    Jeri Lager, FNP-C [facility], [place] Office: [phone] Fax: [phone]

## 2018-09-02 ENCOUNTER — Encounter (HOSPITAL_COMMUNITY): Payer: Self-pay | Admitting: Emergency Medicine

## 2018-09-02 ENCOUNTER — Emergency Department (HOSPITAL_COMMUNITY)
Admission: EM | Admit: 2018-09-02 | Discharge: 2018-09-02 | Disposition: A | Discharge status: Home / Self Care | Payer: Medicare Other | Attending: Emergency Medicine | Admitting: Emergency Medicine

## 2018-09-02 ENCOUNTER — Encounter: Payer: Self-pay | Admitting: Cardiology

## 2018-09-02 ENCOUNTER — Other Ambulatory Visit: Payer: Self-pay

## 2018-09-02 ENCOUNTER — Emergency Department (HOSPITAL_COMMUNITY): Payer: Medicare Other

## 2018-09-02 ENCOUNTER — Ambulatory Visit: Payer: Medicare Other | Admitting: Cardiology

## 2018-09-02 VITALS — BP 112/75 | HR 51 | Ht 64.0 in | Wt 103.0 lb

## 2018-09-02 DIAGNOSIS — Z91040 Latex allergy status: Secondary | ICD-10-CM | POA: Diagnosis not present

## 2018-09-02 DIAGNOSIS — G250 Essential tremor: Secondary | ICD-10-CM | POA: Diagnosis not present

## 2018-09-02 DIAGNOSIS — I10 Essential (primary) hypertension: Secondary | ICD-10-CM | POA: Diagnosis not present

## 2018-09-02 DIAGNOSIS — S42401A Unspecified fracture of lower end of right humerus, initial encounter for closed fracture: Secondary | ICD-10-CM | POA: Diagnosis not present

## 2018-09-02 DIAGNOSIS — Y999 Unspecified external cause status: Secondary | ICD-10-CM | POA: Diagnosis not present

## 2018-09-02 DIAGNOSIS — Y939 Activity, unspecified: Secondary | ICD-10-CM | POA: Insufficient documentation

## 2018-09-02 DIAGNOSIS — J449 Chronic obstructive pulmonary disease, unspecified: Secondary | ICD-10-CM | POA: Insufficient documentation

## 2018-09-02 DIAGNOSIS — Z91010 Allergy to peanuts: Secondary | ICD-10-CM | POA: Insufficient documentation

## 2018-09-02 DIAGNOSIS — I480 Paroxysmal atrial fibrillation: Secondary | ICD-10-CM

## 2018-09-02 DIAGNOSIS — N183 Chronic kidney disease, stage 3 (moderate): Secondary | ICD-10-CM | POA: Diagnosis not present

## 2018-09-02 DIAGNOSIS — S42491A Other displaced fracture of lower end of right humerus, initial encounter for closed fracture: Secondary | ICD-10-CM

## 2018-09-02 DIAGNOSIS — R42 Dizziness and giddiness: Secondary | ICD-10-CM | POA: Diagnosis present

## 2018-09-02 DIAGNOSIS — Z79899 Other long term (current) drug therapy: Secondary | ICD-10-CM | POA: Insufficient documentation

## 2018-09-02 DIAGNOSIS — Y929 Unspecified place or not applicable: Secondary | ICD-10-CM | POA: Diagnosis not present

## 2018-09-02 DIAGNOSIS — I129 Hypertensive chronic kidney disease with stage 1 through stage 4 chronic kidney disease, or unspecified chronic kidney disease: Secondary | ICD-10-CM | POA: Insufficient documentation

## 2018-09-02 DIAGNOSIS — I951 Orthostatic hypotension: Secondary | ICD-10-CM

## 2018-09-02 DIAGNOSIS — W19XXXA Unspecified fall, initial encounter: Secondary | ICD-10-CM | POA: Insufficient documentation

## 2018-09-02 LAB — COMPREHENSIVE METABOLIC PANEL
ALT: 12 U/L (ref 0–44)
AST: 19 U/L (ref 15–41)
Albumin: 3.5 g/dL (ref 3.5–5.0)
Alkaline Phosphatase: 57 U/L (ref 38–126)
Anion gap: 11 (ref 5–15)
BUN: 29 mg/dL — AB (ref 8–23)
CO2: 26 mmol/L (ref 22–32)
Calcium: 9.1 mg/dL (ref 8.9–10.3)
Chloride: 103 mmol/L (ref 98–111)
Creatinine, Ser: 1.34 mg/dL — ABNORMAL HIGH (ref 0.44–1.00)
GFR calc Af Amer: 40 mL/min — ABNORMAL LOW (ref >60)
GFR calc non Af Amer: 35 mL/min — ABNORMAL LOW (ref >60)
Glucose, Bld: 111 mg/dL — ABNORMAL HIGH (ref 70–99)
Potassium: 3.5 mmol/L (ref 3.5–5.1)
Sodium: 140 mmol/L (ref 135–145)
Total Bilirubin: 0.9 mg/dL (ref 0.3–1.2)
Total Protein: 6.9 g/dL (ref 6.5–8.1)

## 2018-09-02 LAB — CBC WITH DIFFERENTIAL/PLATELET
ABS IMMATURE GRANULOCYTES: 0.02 10*3/uL (ref 0.00–0.07)
Basophils Absolute: 0 10*3/uL (ref 0.0–0.1)
Basophils Relative: 1 %
Eosinophils Absolute: 0.1 10*3/uL (ref 0.0–0.5)
Eosinophils Relative: 1 %
HCT: 32.1 % — ABNORMAL LOW (ref 36.0–46.0)
Hemoglobin: 10.3 g/dL — ABNORMAL LOW (ref 12.0–15.0)
Immature Granulocytes: 0 %
Lymphocytes Relative: 23 %
Lymphs Abs: 1.1 10*3/uL (ref 0.7–4.0)
MCH: 31.8 pg (ref 26.0–34.0)
MCHC: 32.1 g/dL (ref 30.0–36.0)
MCV: 99.1 fL (ref 80.0–100.0)
Monocytes Absolute: 0.4 10*3/uL (ref 0.1–1.0)
Monocytes Relative: 8 %
NEUTROS ABS: 3.1 10*3/uL (ref 1.7–7.7)
Neutrophils Relative %: 67 %
Platelets: 129 10*3/uL — ABNORMAL LOW (ref 150–400)
RBC: 3.24 MIL/uL — ABNORMAL LOW (ref 3.87–5.11)
RDW: 14.1 % (ref 11.5–15.5)
WBC: 4.6 10*3/uL (ref 4.0–10.5)
nRBC: 0 % (ref 0.0–0.2)

## 2018-09-02 LAB — URINALYSIS, ROUTINE W REFLEX MICROSCOPIC
Bilirubin Urine: NEGATIVE
GLUCOSE, UA: NEGATIVE mg/dL
Hgb urine dipstick: NEGATIVE
Ketones, ur: NEGATIVE mg/dL
Leukocytes,Ua: NEGATIVE
Nitrite: NEGATIVE
PH: 6 (ref 5.0–8.0)
Protein, ur: NEGATIVE mg/dL
Specific Gravity, Urine: 1.011 (ref 1.005–1.030)

## 2018-09-02 LAB — TROPONIN I: Troponin I: <0.03 ng/mL (ref <0.03)

## 2018-09-02 MED ORDER — OXYCODONE HCL 5 MG PO TABS: 2.5000 mg | ORAL_TABLET | Freq: Four times a day (QID) | ORAL | 0 refills | Status: DC | PRN

## 2018-09-02 MED ORDER — DOCUSATE SODIUM 100 MG PO CAPS: 100.0000 mg | ORAL_CAPSULE | Freq: Every day | ORAL | 0 refills | Status: AC

## 2018-09-02 MED ORDER — ACETAMINOPHEN 500 MG PO TABS: 500.0000 mg | ORAL_TABLET | Freq: Four times a day (QID) | ORAL | 0 refills | Status: AC | PRN

## 2018-09-02 MED ORDER — FENTANYL CITRATE (PF) 100 MCG/2ML IJ SOLN
12.5000 ug | Freq: Once | INTRAMUSCULAR | Status: AC
  Administered 2018-09-02: 12.5 ug via INTRAVENOUS
  Filled 2018-09-02: qty 2

## 2018-09-02 NOTE — ED Notes (Signed)
Pt ambulated in hallway. Patient at baseline but recommend 1 assist. Family educated and willing to help with ambulation at home.

## 2018-09-02 NOTE — Discharge Instructions (Addendum)
Be very careful when taking the pain medications  If pain is uncontrolled OR if you have too much difficulty getting around the house, return to the ER or follow-up with your doctor

## 2018-09-02 NOTE — ED Triage Notes (Signed)
Patient was walking back to her car and fell to ground on right side of body.  She stuck her right arm out to catch herself and hurt right shoulder.  Deformity seen by EMS.  Movement causes her to scream.  Patient did hit her head, no LOC, full recall of incident.  Patient is on Eliquis.  She does have moments of confusion which is baseline per family.

## 2018-09-02 NOTE — ED Provider Notes (Signed)
[facility] Provider Note   CSN: [id number] Arrival date & time: 09/02/18  1935     History   Chief Complaint Chief Complaint  Patient presents with  . Fall    HPI [name] is a 85 y.o. female.  HPI   85 yo F with PMHx as below here with fall. Pt arrives with daughter. She has a h/o recurrent falls due to transient vertigo, states she had a "spell of dizziness" today which caused her to fall. No CP, SOB. This is a known, chronic issue for her. She fell directly onto her R elbow, with subsequent severe, aching, throbbing, R shoulder pain. Family was there, EMS called. No  Head injury. She is at her mental baseline. Denies any neck pain. No CP, hip pain. No other complaints. No recent fever or chills. No other complaints. Pain is worse w/ movement, palpations. No alleviating factors.  Past Medical History:  Diagnosis Date  . Allergy   . Cancer ([place])    follicular lymphoma  . Chronic atrial fibrillation 11/18/2017  . Chronic kidney disease   . COPD (chronic obstructive pulmonary disease) ([place])   . Depression   . DJD (degenerative joint disease)   . Dysuria 09/25/2015  . Essential tremor   . GERD (gastroesophageal reflux disease)   . Heart murmur    had echo 20 yr ago-not sure where  . Hx of colonic polyp 10/02/05  . Hypertension   . MGUS (monoclonal gammopathy of unknown significance) 10/24/2015  . MVP (mitral valve prolapse)   . Orthostatic hypotension   . Osteoporosis   . Paroxysmal atrial fibrillation (H[facility])   . Venous insufficiency     Patient Active Problem List   Diagnosis Date Noted  . Orthostatic hypotension 09/01/2018  . Hypokalemia 05/19/2018  . Deficiency anemia 11/18/2017  . Pancytopenia, acquired ([place]) 05/13/2017  . Hypertension 01/30/2017  . GERD (gastroesophageal reflux disease) 01/30/2017  . Monoclonal gammopathy of unknown significance (MGUS) 01/30/2017  . Chest pain 01/30/2017  . Follicular (B cell)  lymphoma ([place]) 01/30/2017  . Peripheral neuropathy 01/30/2017  . Essential tremor 01/30/2017  . CKD (chronic kidney disease) stage 3, GFR 30-59 ml/min (HCC) 01/30/2017  . Depression 01/30/2017  . DJD (degenerative joint disease) 01/30/2017  . COPD (chronic obstructive pulmonary disease) ([place]) 01/30/2017  . 6mm right ?? Lung nodule seen on imaging study 01/30/2017  . Hypomagnesemia 01/30/2017  . Elevated troponin   . Full body hives 05/02/2016  . MGUS (monoclonal gammopathy of unknown significance) 10/24/2015  . Intermittent vertigo 09/26/2014  . Systolic hypertension 60/63/0160  . History of B-cell lymphoma 12/11/2011  . Lymphadenopathy 10/25/2011    Past Surgical History:  Procedure Laterality Date  . APPENDECTOMY    . CATARACT EXTRACTION Bilateral   . COLONOSCOPY    . DILATION AND CURETTAGE OF UTERUS     several  . OVARIAN CYST REMOVAL       OB History   No obstetric history on file.      Home Medications    Prior to Admission medications   Medication Sig Start Date End Date Taking? Authorizing Provider  albuterol (PROVENTIL HFA;VENTOLIN HFA) 108 (90 Base) MCG/ACT inhaler Inhale 2 puffs into the lungs every 6 (six) hours as needed for wheezing or shortness of breath. 02/01/17  Yes Oswald Hillock, MD  amiodarone (PACERONE) 200 MG tablet Take 1 tablet (200 mg total) by mouth daily. 02/02/17  Yes Oswald Hillock, MD  apixaban (ELIQUIS) 2.5 MG  TABS tablet Take 1 tablet (2.5 mg total) by mouth 2 (two) times daily. 02/01/17  Yes Oswald Hillock, MD  acetaminophen (TYLENOL) 500 MG tablet Take 1-2 tablets (500-1,000 mg total) by mouth every 6 (six) hours as needed for moderate pain. 09/02/18   Duffy Bruce, MD  Calcium Carbonate-Vitamin D (CALCIUM-D) 600-400 MG-UNIT TABS Take 1 tablet by mouth daily.     [provider]  chlordiazePOXIDE (LIBRIUM) 10 MG capsule Take 10 mg by mouth 2 (two) times daily as needed for anxiety.     [provider]  clindamycin (CLEOCIN)  150 MG capsule Take 4 capsules by mouth as needed. As needed for dental procedures 03/16/18   [provider]  dextromethorphan-guaiFENesin (MUCINEX DM) 30-600 MG per 12 hr tablet Take 1 tablet by mouth daily as needed (sinus).     [provider]  docusate sodium (COLACE) 100 MG capsule Take 1 capsule (100 mg total) by mouth daily for 10 days. 09/02/18 09/12/18  Duffy Bruce, MD  fluticasone (FLONASE) 50 MCG/ACT nasal spray Place 2 sprays into the nose daily as needed for allergies.     [provider]  hydrochlorothiazide (MICROZIDE) 12.5 MG capsule Take 1 capsule (12.5 mg total) by mouth daily. 02/02/17   Oswald Hillock, MD  HYDROcodone-acetaminophen (NORCO) 5-325 MG per tablet Take 1 tablet by mouth every 6 (six) hours as needed for moderate pain.     [provider]  hyoscyamine (LEVSIN SL) 0.125 MG SL tablet Place 0.125 mg under the tongue every 4 (four) hours as needed for cramping. Reported on 09/25/2015    [provider]  losartan (COZAAR) 50 MG tablet Take 1 tablet (50 mg total) by mouth daily. 02/02/17   Oswald Hillock, MD  meclizine (ANTIVERT) 50 MG tablet Take 0.5 tablets (25 mg total) by mouth 3 (three) times daily as needed. 06/07/14   Pamella Pert, MD  Multiple Vitamin (MULTIVITAMIN) capsule Take 1 capsule by mouth daily.    [provider]  oxyCODONE (ROXICODONE) 5 MG immediate release tablet Take 0.5 tablets (2.5 mg total) by mouth every 6 (six) hours as needed for severe pain or breakthrough pain. 09/02/18   Duffy Bruce, MD  propranolol ER (INDERAL LA) 120 MG 24 hr capsule Take 120 mg by mouth 2 (two) times daily. 07/28/18   [provider]  Propylene Glycol (SYSTANE BALANCE) 0.6 % SOLN Apply 1-2 drops to eye 3 (three) times daily as needed (dry eyes.).     [provider]  ranitidine (ZANTAC) 300 MG tablet Take 150 mg by mouth 2 (two) times daily.     [provider]  sertraline (ZOLOFT) 50 MG tablet  Take 50 mg by mouth daily.    [provider]    Family History Family History  Problem Relation Age of Onset  . Heart murmur Mother   . Stroke Mother 84  . Stroke Father 43    Social History Social History   Tobacco Use  . Smoking status: Never Smoker  . Smokeless tobacco: Never Used  Substance Use Topics  . Alcohol use: No    Alcohol/week: 0.0 standard drinks  . Drug use: No     Allergies   Amoxicillin; Aspirin; Benadryl [diphenhydramine hcl]; Ibuprofen; Sulfa antibiotics; Latex; Peanut-containing drug products; Strawberry c [ascorbate]; Sulindac; and Penicillins   Review of Systems Review of Systems  Constitutional: Negative for chills, fatigue and fever.  HENT: Negative for congestion and rhinorrhea.   Eyes: Negative for visual disturbance.  Respiratory: Negative for cough, shortness of breath and wheezing.   Cardiovascular: Negative for chest pain and leg swelling.  Gastrointestinal: Negative for abdominal pain, diarrhea, nausea and vomiting.  Genitourinary: Negative for dysuria and flank pain.  Musculoskeletal: Positive for arthralgias. Negative for neck pain and neck stiffness.  Skin: Positive for wound. Negative for rash.  Allergic/Immunologic: Negative for immunocompromised state.  Neurological: Negative for syncope, weakness and headaches.  All other systems reviewed and are negative.    Physical Exam Updated Vital Signs BP (!) 168/98 (BP Location: Left Arm)   Pulse (!) 58   Temp 98.2 F (36.8 C) (Oral)   Resp 16   SpO2 96%   Physical Exam Vitals signs and nursing note reviewed.  Constitutional:      General: She is not in acute distress.    Appearance: She is well-developed.  HENT:     Head: Normocephalic and atraumatic.  Eyes:     Conjunctiva/sclera: Conjunctivae normal.  Neck:     Musculoskeletal: Neck supple.  Cardiovascular:     Rate and Rhythm: Normal rate and regular rhythm.     Heart sounds: Normal heart sounds. No murmur.  No friction rub.  Pulmonary:     Effort: Pulmonary effort is normal. No respiratory distress.     Breath sounds: Normal breath sounds. No wheezing or rales.  Abdominal:     General: There is no distension.     Palpations: Abdomen is soft.     Tenderness: There is no abdominal tenderness.  Skin:    General: Skin is warm.     Capillary Refill: Capillary refill takes less than 2 seconds.  Neurological:     Mental Status: She is alert and oriented to person, place, and time.     GCS: GCS eye subscore is 4. GCS verbal subscore is 5. GCS motor subscore is 6.     Cranial Nerves: Cranial nerves are intact.     Sensory: Sensation is intact.     Motor: Motor function is intact. No abnormal muscle tone.     Coordination: Coordination is intact.     UPPER EXTREMITY EXAM: RIGHT  INSPECTION & PALPATION: Superficial skin tear to R elbow. No deep lacerations or puncture wounds. Obvious deformity to R elbow with significant hematoma. Diffuse TTP. No forearm TTP.  SENSORY: Sensation is intact to light touch in:  Superficial radial nerve distribution (dorsal first web space) Median nerve distribution (tip of index finger)   Ulnar nerve distribution (tip of small finger)     MOTOR:  + Motor posterior interosseous nerve (thumb IP extension) + Anterior interosseous nerve (thumb IP flexion, index finger DIP flexion) + Radial nerve (wrist extension) + Median nerve (palpable firing thenar mass) + Ulnar nerve (palpable firing of first dorsal interosseous muscle)  VASCULAR: 2+ radial pulse Brisk capillary refill < 2 sec, fingers warm and well-perfused  COMPARTMENTS: Soft, warm, well-perfused No pain with passive extension No paresthesias   ED Treatments / Results  Labs (all labs ordered are listed, but only abnormal results are displayed) Labs Reviewed  CBC WITH DIFFERENTIAL/PLATELET - Abnormal; Notable for the following components:      Result Value   RBC 3.24 (*)    Hemoglobin 10.3 (*)     HCT 32.1 (*)    Platelets 129 (*)    All other components within normal limits  COMPREHENSIVE METABOLIC PANEL - Abnormal; Notable for the following components:   Glucose, Bld 111 (*)    BUN 29 (*)  Creatinine, Ser 1.34 (*)    GFR calc non Af Amer 35 (*)    GFR calc Af Amer 40 (*)    All other components within normal limits  URINALYSIS, ROUTINE W REFLEX MICROSCOPIC  TROPONIN I    EKG EKG Interpretation  Date/Time:  Wednesday September 02 2018 20:00:53 EST Ventricular Rate:  50 PR Interval:    QRS Duration: 90 QT Interval:  508 QTC Calculation: 464 R Axis:   -23 Text Interpretation:  Sinus rhythm Atrial premature complex Borderline prolonged PR interval Borderline left axis deviation Probable anterior infarct, old Nonspecific repol abnormality, lateral leads No significant change since last tracing Confirmed by Duffy Bruce [phone]) on 09/02/2018 8:03:12 PM   Radiology Dg Chest 2 View  Result Date: 09/02/2018 CLINICAL DATA:  Right chest wall pain EXAM: CHEST - 2 VIEW COMPARISON:  02/11/2017 FINDINGS: Cardiomegaly. There is hyperinflation of the lungs compatible with COPD. No confluent opacities, effusions or edema. No acute bony abnormality. IMPRESSION: Cardiomegaly, COPD.  No active disease. Electronically Signed   By: Rolm Baptise M.D.   On: 09/02/2018 21:04   Dg Elbow Complete Right  Result Date: 09/02/2018 CLINICAL DATA:  Fall.  Right elbow swelling and pain. EXAM: RIGHT ELBOW - COMPLETE 3+ VIEW COMPARISON:  None. FINDINGS: There is deformity in the distal right humerus compatible with distal humeral fracture. Fracture fragments are moderately displaced. No subluxation or dislocation. IMPRESSION: Displaced, comminuted distal right humeral fracture. Electronically Signed   By: Rolm Baptise M.D.   On: 09/02/2018 21:06   Ct Head Wo Contrast  Result Date: 09/02/2018 CLINICAL DATA:  Fall. EXAM: CT HEAD WITHOUT CONTRAST CT CERVICAL SPINE WITHOUT CONTRAST TECHNIQUE:  Multidetector CT imaging of the head and cervical spine was performed following the standard protocol without intravenous contrast. Multiplanar CT image reconstructions of the cervical spine were also generated. COMPARISON:  None. FINDINGS: CT HEAD FINDINGS Brain: There is atrophy and chronic small vessel disease changes. No acute intracranial abnormality. Specifically, no hemorrhage, hydrocephalus, mass lesion, acute infarction, or significant intracranial injury. Vascular: No hyperdense vessel or unexpected calcification. Skull: No acute calvarial abnormality. Sinuses/Orbits: Opacification of the right maxillary sinus and ethmoid air cells. Mastoid air cells clear. Orbital soft tissues unremarkable. Other: None CT CERVICAL SPINE FINDINGS Alignment: No subluxation Skull base and vertebrae: No acute fracture. No primary bone lesion or focal pathologic process. Soft tissues and spinal canal: No prevertebral fluid or swelling. No visible canal hematoma. Disc levels: Advanced bilateral degenerative facet disease. Moderate degenerative disc disease, most pronounced at C5-6. Upper chest: Biapical scarring.  No acute findings. Other: Carotid artery calcifications.  No acute findings. IMPRESSION: Atrophy, chronic microvascular disease. No acute intracranial abnormality. Cervical spondylosis.  No acute bony abnormality. Electronically Signed   By: Rolm Baptise M.D.   On: 09/02/2018 20:33   Ct Cervical Spine Wo Contrast  Result Date: 09/02/2018 CLINICAL DATA:  Fall. EXAM: CT HEAD WITHOUT CONTRAST CT CERVICAL SPINE WITHOUT CONTRAST TECHNIQUE: Multidetector CT imaging of the head and cervical spine was performed following the standard protocol without intravenous contrast. Multiplanar CT image reconstructions of the cervical spine were also generated. COMPARISON:  None. FINDINGS: CT HEAD FINDINGS Brain: There is atrophy and chronic small vessel disease changes. No acute intracranial abnormality. Specifically, no  hemorrhage, hydrocephalus, mass lesion, acute infarction, or significant intracranial injury. Vascular: No hyperdense vessel or unexpected calcification. Skull: No acute calvarial abnormality. Sinuses/Orbits: Opacification of the right maxillary sinus and ethmoid air cells. Mastoid air cells clear. Orbital soft tissues unremarkable.  Other: None CT CERVICAL SPINE FINDINGS Alignment: No subluxation Skull base and vertebrae: No acute fracture. No primary bone lesion or focal pathologic process. Soft tissues and spinal canal: No prevertebral fluid or swelling. No visible canal hematoma. Disc levels: Advanced bilateral degenerative facet disease. Moderate degenerative disc disease, most pronounced at C5-6. Upper chest: Biapical scarring.  No acute findings. Other: Carotid artery calcifications.  No acute findings. IMPRESSION: Atrophy, chronic microvascular disease. No acute intracranial abnormality. Cervical spondylosis.  No acute bony abnormality. Electronically Signed   By: Rolm Baptise M.D.   On: 09/02/2018 20:33   Dg Humerus Right  Result Date: 09/02/2018 CLINICAL DATA:  Fall, right elbow pain EXAM: RIGHT HUMERUS - 2+ VIEW COMPARISON:  Right elbow performed today. FINDINGS: Deformity in the distal right humerus compatible with comminuted, displaced fracture. No visible subluxation or dislocation. IMPRESSION: Comminuted, displaced distal right humeral fracture. Electronically Signed   By: Rolm Baptise M.D.   On: 09/02/2018 21:06   Dg Hip Unilat W Or Wo Pelvis 2-3 Views Right  Result Date: 09/02/2018 CLINICAL DATA:  Fall, right hip pain EXAM: DG HIP (WITH OR WITHOUT PELVIS) 2-3V RIGHT COMPARISON:  08/07/2018 pelvic radiograph FINDINGS: No pelvic fracture or diastasis. No evidence of right hip fracture or dislocation. Mild-to-moderate bilateral hip osteoarthritis, unchanged. No suspicious focal osseous lesions. Degenerative changes in the visualized lower lumbar spine. IMPRESSION: No evidence of fracture or  right hip malalignment. Mild-to-moderate bilateral hip osteoarthritis. Electronically Signed   By: Ilona Sorrel M.D.   On: 09/02/2018 21:10    Procedures Procedures (including critical care time)  Medications Ordered in ED Medications  fentaNYL (SUBLIMAZE) injection 12.5 mcg (12.5 mcg Intravenous Given 09/02/18 2144)     Initial Impression / Assessment and Plan / ED Course  I have reviewed the triage vital signs and the nursing notes.  Pertinent labs & imaging results that were available during my care of the patient were reviewed by me and considered in my medical decision making (see chart for details).    85 yo F here with R elbow pain, fall. Regarding her fall - she has documented h/o recurrent falls 2/2 her age, poor mobility, and dizziness. No new focal neurological deficits. No CP, SOB, or sx or ACS, arrhythmia, or cardiac etiology. Her labs are reassuring and @ baseline. Doubt medical etiology. Regarding her trauma, imaging shows distal humerus fx. She has a hematoma likely 2/2 her blood thinner use, but no distal weaknses, numbness, paresthesias, or signs of vascular or neurological compromise. Dr. Percell Miller of Ortho has reviewed history, images - will refer for outpt follow-up. Splint placed. Pt ambulatory @ baseline. Return precautions given.    Final Clinical Impressions(s) / ED Diagnoses   Final diagnoses:  Closed bicondylar fracture of distal end of right humerus, initial encounter  Fall, initial encounter    ED Discharge Orders         Ordered    oxyCODONE (ROXICODONE) 5 MG immediate release tablet  Every 6 hours PRN     09/02/18 2312    acetaminophen (TYLENOL) 500 MG tablet  Every 6 hours PRN     09/02/18 2312    docusate sodium (COLACE) 100 MG capsule  Daily     09/02/18 2312           Duffy Bruce, MD 09/03/18 [phone]

## 2018-09-02 NOTE — ED Notes (Signed)
Patient transported to CT 

## 2018-09-02 NOTE — ED Notes (Signed)
Patient verbalizes understanding of discharge instructions. Opportunity for questioning and answers were provided. Armband removed by staff, pt discharged from ED in wheelchair.  

## 2018-09-03 ENCOUNTER — Encounter: Payer: Self-pay | Admitting: Cardiology

## 2018-09-04 ENCOUNTER — Emergency Department (HOSPITAL_COMMUNITY)
Admission: EM | Admit: 2018-09-04 | Discharge: 2018-09-08 | Disposition: A | Discharge status: Home / Self Care | Payer: Medicare Other | Attending: Emergency Medicine | Admitting: Emergency Medicine

## 2018-09-04 DIAGNOSIS — R531 Weakness: Secondary | ICD-10-CM | POA: Insufficient documentation

## 2018-09-04 DIAGNOSIS — Z8572 Personal history of non-Hodgkin lymphomas: Secondary | ICD-10-CM | POA: Insufficient documentation

## 2018-09-04 DIAGNOSIS — W19XXXA Unspecified fall, initial encounter: Secondary | ICD-10-CM | POA: Insufficient documentation

## 2018-09-04 DIAGNOSIS — J449 Chronic obstructive pulmonary disease, unspecified: Secondary | ICD-10-CM | POA: Diagnosis not present

## 2018-09-04 DIAGNOSIS — Z7901 Long term (current) use of anticoagulants: Secondary | ICD-10-CM | POA: Insufficient documentation

## 2018-09-04 DIAGNOSIS — S42401D Unspecified fracture of lower end of right humerus, subsequent encounter for fracture with routine healing: Secondary | ICD-10-CM | POA: Insufficient documentation

## 2018-09-04 DIAGNOSIS — Z91010 Allergy to peanuts: Secondary | ICD-10-CM | POA: Insufficient documentation

## 2018-09-04 DIAGNOSIS — I129 Hypertensive chronic kidney disease with stage 1 through stage 4 chronic kidney disease, or unspecified chronic kidney disease: Secondary | ICD-10-CM | POA: Diagnosis not present

## 2018-09-04 DIAGNOSIS — Z91040 Latex allergy status: Secondary | ICD-10-CM | POA: Insufficient documentation

## 2018-09-04 DIAGNOSIS — N189 Chronic kidney disease, unspecified: Secondary | ICD-10-CM | POA: Diagnosis not present

## 2018-09-04 DIAGNOSIS — Z79899 Other long term (current) drug therapy: Secondary | ICD-10-CM | POA: Diagnosis not present

## 2018-09-04 DIAGNOSIS — Z022 Encounter for examination for admission to residential institution: Secondary | ICD-10-CM | POA: Diagnosis present

## 2018-09-04 MED ORDER — DOCUSATE SODIUM 100 MG PO CAPS
100.0000 mg | ORAL_CAPSULE | Freq: Every day | ORAL | Status: DC
  Administered 2018-09-04 – 2018-09-08 (×4): 100 mg via ORAL
  Filled 2018-09-04 (×5): qty 1

## 2018-09-04 MED ORDER — POLYVINYL ALCOHOL 1.4 % OP SOLN: 1.0000 [drp] | OPHTHALMIC | Status: DC | PRN

## 2018-09-04 MED ORDER — ALBUTEROL SULFATE HFA 108 (90 BASE) MCG/ACT IN AERS: 2.0000 | INHALATION_SPRAY | Freq: Four times a day (QID) | RESPIRATORY_TRACT | Status: DC | PRN

## 2018-09-04 MED ORDER — CHLORDIAZEPOXIDE HCL 10 MG PO CAPS
10.0000 mg | ORAL_CAPSULE | Freq: Two times a day (BID) | ORAL | Status: DC | PRN
  Administered 2018-09-04 – 2018-09-05 (×2): 10 mg via ORAL
  Filled 2018-09-04 (×2): qty 1

## 2018-09-04 MED ORDER — CALCIUM CARBONATE-VITAMIN D 500-200 MG-UNIT PO TABS
1.0000 | ORAL_TABLET | ORAL | Status: DC | PRN
  Filled 2018-09-04: qty 1

## 2018-09-04 MED ORDER — FAMOTIDINE 20 MG PO TABS
10.0000 mg | ORAL_TABLET | Freq: Two times a day (BID) | ORAL | Status: DC
  Administered 2018-09-04 – 2018-09-08 (×8): 10 mg via ORAL
  Filled 2018-09-04 (×8): qty 1

## 2018-09-04 MED ORDER — SERTRALINE HCL 50 MG PO TABS
50.0000 mg | ORAL_TABLET | Freq: Every day | ORAL | Status: DC
  Administered 2018-09-05 – 2018-09-08 (×4): 50 mg via ORAL
  Filled 2018-09-04 (×5): qty 1

## 2018-09-04 MED ORDER — HYDROCHLOROTHIAZIDE 12.5 MG PO CAPS
12.5000 mg | ORAL_CAPSULE | Freq: Every day | ORAL | Status: DC
  Administered 2018-09-05 – 2018-09-08 (×4): 12.5 mg via ORAL
  Filled 2018-09-04 (×4): qty 1

## 2018-09-04 MED ORDER — OXYCODONE HCL 5 MG PO TABS
2.5000 mg | ORAL_TABLET | Freq: Four times a day (QID) | ORAL | Status: DC | PRN
  Administered 2018-09-04: 2.5 mg via ORAL
  Filled 2018-09-04: qty 1

## 2018-09-04 MED ORDER — APIXABAN 2.5 MG PO TABS
2.5000 mg | ORAL_TABLET | Freq: Two times a day (BID) | ORAL | Status: DC
  Administered 2018-09-04 – 2018-09-08 (×7): 2.5 mg via ORAL
  Filled 2018-09-04 (×9): qty 1

## 2018-09-04 MED ORDER — ADULT MULTIVITAMIN W/MINERALS CH
1.0000 | ORAL_TABLET | Freq: Every day | ORAL | Status: DC
  Administered 2018-09-04 – 2018-09-08 (×5): 1 via ORAL
  Filled 2018-09-04 (×5): qty 1

## 2018-09-04 MED ORDER — MULTIVITAMINS PO CAPS: 1.0000 | ORAL_CAPSULE | Freq: Every day | ORAL | Status: DC

## 2018-09-04 MED ORDER — MECLIZINE HCL 25 MG PO TABS: 25.0000 mg | ORAL_TABLET | Freq: Three times a day (TID) | ORAL | Status: DC | PRN

## 2018-09-04 MED ORDER — LOSARTAN POTASSIUM 50 MG PO TABS
50.0000 mg | ORAL_TABLET | Freq: Every day | ORAL | Status: DC
  Administered 2018-09-05 – 2018-09-08 (×4): 50 mg via ORAL
  Filled 2018-09-04 (×4): qty 1

## 2018-09-04 MED ORDER — HYOSCYAMINE SULFATE 0.125 MG SL SUBL: 0.1250 mg | SUBLINGUAL_TABLET | SUBLINGUAL | Status: DC | PRN

## 2018-09-04 MED ORDER — ACETAMINOPHEN 500 MG PO TABS
500.0000 mg | ORAL_TABLET | Freq: Four times a day (QID) | ORAL | Status: DC | PRN
  Administered 2018-09-06: 500 mg via ORAL
  Filled 2018-09-04: qty 1

## 2018-09-04 MED ORDER — AMIODARONE HCL 200 MG PO TABS
200.0000 mg | ORAL_TABLET | Freq: Every day | ORAL | Status: DC
  Administered 2018-09-05 – 2018-09-08 (×4): 200 mg via ORAL
  Filled 2018-09-04 (×4): qty 1

## 2018-09-04 MED ORDER — PROPRANOLOL HCL ER 60 MG PO CP24
120.0000 mg | ORAL_CAPSULE | Freq: Two times a day (BID) | ORAL | Status: DC
  Administered 2018-09-05 – 2018-09-08 (×5): 120 mg via ORAL
  Filled 2018-09-04: qty 1
  Filled 2018-09-04 (×2): qty 2
  Filled 2018-09-04: qty 1
  Filled 2018-09-04 (×3): qty 2
  Filled 2018-09-04: qty 1
  Filled 2018-09-04: qty 2
  Filled 2018-09-04 (×2): qty 1

## 2018-09-04 MED ORDER — HYDROCODONE-ACETAMINOPHEN 5-325 MG PO TABS
1.0000 | ORAL_TABLET | Freq: Four times a day (QID) | ORAL | Status: DC | PRN
  Administered 2018-09-04 – 2018-09-07 (×5): 1 via ORAL
  Filled 2018-09-04 (×5): qty 1

## 2018-09-04 NOTE — Progress Notes (Signed)
CSW received a call back from pt's daughter who wanted to confirm pt will be staying overnight.  CSW re-explained referral process and pt's daughter was appreciative and thanked the CSW.  2nd shift ED CSW will leave handoff for 1st shift ED CSW.  CSW will continue to follow for D/C needs.  Alphonse Guild. Avamae Dehaan, LCSW, LCAS, CSI Clinical Social Worker Ph: [phone]

## 2018-09-04 NOTE — Discharge Planning (Signed)
[facility] notes referral for SNF placement. Will await results of PT consult.

## 2018-09-04 NOTE — ED Triage Notes (Signed)
Pt to ER by family for placement to rehab facility after being sent home with family Wednesday post-fall with arm injury. Family state they are unable to take care of her at home safely and feel she needs placement.

## 2018-09-04 NOTE — NC FL2 (Signed)
[place] LEVEL OF CARE SCREENING TOOL     IDENTIFICATION  Patient Name: [name] Birthdate: 10/10/1925 Sex: female Admission Date (Current Location): 09/04/2018  [facility] and [place] Number:  Herbalist and Address:  [facility], [place] [address], [place],  [address]      Provider Number: [id number]  Attending Physician Name and Address:  Sherwood Gambler, MD  Relative Name and Phone Number:       Current Level of Care: Hospital Recommended Level of Care: [place] Prior Approval Number:    Date Approved/Denied: 09/04/18 PASRR Number: [id number]  Discharge Plan: SNF    Current Diagnoses: Patient Active Problem List   Diagnosis Date Noted  . Orthostatic hypotension 09/01/2018  . Hypokalemia 05/19/2018  . Deficiency anemia 11/18/2017  . Pancytopenia, acquired ([place]) 05/13/2017  . Hypertension 01/30/2017  . GERD (gastroesophageal reflux disease) 01/30/2017  . Monoclonal gammopathy of unknown significance (MGUS) 01/30/2017  . Chest pain 01/30/2017  . Follicular (B cell) lymphoma ([place]) 01/30/2017  . Peripheral neuropathy 01/30/2017  . Essential tremor 01/30/2017  . CKD (chronic kidney disease) stage 3, GFR 30-59 ml/min (HCC) 01/30/2017  . Depression 01/30/2017  . DJD (degenerative joint disease) 01/30/2017  . COPD (chronic obstructive pulmonary disease) ([place]) 01/30/2017  . 72mm right ?? Lung nodule seen on imaging study 01/30/2017  . Hypomagnesemia 01/30/2017  . Elevated troponin   . Full body hives 05/02/2016  . MGUS (monoclonal gammopathy of unknown significance) 10/24/2015  . Intermittent vertigo 09/26/2014  . Systolic hypertension 53/66/4403  . History of B-cell lymphoma 12/11/2011  . Lymphadenopathy 10/25/2011    Orientation RESPIRATION BLADDER Height & Weight     Self, Time, Situation, Place  Normal Continent Weight:   Height:     BEHAVIORAL SYMPTOMS/MOOD NEUROLOGICAL BOWEL  NUTRITION STATUS      Continent Diet(Regular diet)  AMBULATORY STATUS COMMUNICATION OF NEEDS Skin   Extensive Assist Verbally (Pt has a skin tear on right elbow area, due to fall.  Arm is in a splint.)                       Personal Care Assistance Level of Assistance  Bathing, Dressing Bathing Assistance: Limited assistance   Dressing Assistance: Limited assistance     Functional Limitations Info             [place]  PT (By licensed PT), OT (By licensed OT)     PT Frequency: 5 OT Frequency: 5            Contractures Contractures Info: Not present    Additional Factors Info  Code Status, Allergies Code Status Info: Prior Allergies Info: Amoxicillin, Aspirin, Benadryl Diphenhydramine Hcl, Ibuprofen, Sulfa Antibiotics, Latex, Peanut-containing Drug Products, [place] C Ascorbate, Sulindac, Penicillins           Current Medications (09/04/2018):  This is the current hospital active medication list Current Facility-Administered Medications  Medication Dose Route Frequency Provider Last Rate Last Dose  . acetaminophen (TYLENOL) tablet 500-1,000 mg  500-1,000 mg Oral Q6H PRN Sherwood Gambler, MD      . albuterol (PROVENTIL HFA;VENTOLIN HFA) 108 (90 Base) MCG/ACT inhaler 2 puff  2 puff Inhalation Q6H PRN Sherwood Gambler, MD      . Derrill Memo ON 09/05/2018] amiodarone (PACERONE) tablet 200 mg  200 mg Oral Daily Sherwood Gambler, MD      . apixaban (ELIQUIS) tablet 2.5 mg  2.5 mg Oral BID  Sherwood Gambler, MD      . Calcium-D 600-400 MG-UNIT TABS 1 tablet  1 tablet Oral PRN Sherwood Gambler, MD      . chlordiazePOXIDE (LIBRIUM) capsule 10 mg  10 mg Oral BID PRN Sherwood Gambler, MD      . docusate sodium (COLACE) capsule 100 mg  100 mg Oral Daily Sherwood Gambler, MD      . famotidine (PEPCID) tablet 10 mg  10 mg Oral BID Sherwood Gambler, MD      . Glycerin-Hypromellose-PEG 400 0.2-0.2-1 % SOLN 1 drop  1 drop Both Eyes PRN Sherwood Gambler, MD      .  Derrill Memo ON 09/05/2018] hydrochlorothiazide (MICROZIDE) capsule 12.5 mg  12.5 mg Oral Daily Sherwood Gambler, MD      . HYDROcodone-acetaminophen (NORCO/VICODIN) 5-325 MG per tablet 1 tablet  1 tablet Oral Q6H PRN Sherwood Gambler, MD      . hyoscyamine (LEVSIN SL) SL tablet 0.125 mg  0.125 mg Sublingual Q4H PRN Sherwood Gambler, MD      . Derrill Memo ON 09/05/2018] losartan (COZAAR) tablet 50 mg  50 mg Oral Daily Sherwood Gambler, MD      . meclizine (ANTIVERT) tablet 25 mg  25 mg Oral TID PRN Sherwood Gambler, MD      . multivitamin capsule 1 capsule  1 capsule Oral Daily Sherwood Gambler, MD      . propranolol ER (INDERAL LA) 24 hr capsule 120 mg  120 mg Oral BID Sherwood Gambler, MD      . Derrill Memo ON 09/05/2018] sertraline (ZOLOFT) tablet 50 mg  50 mg Oral Daily Sherwood Gambler, MD       Current Outpatient Medications  Medication Sig Dispense Refill  . acetaminophen (TYLENOL) 500 MG tablet Take 1-2 tablets (500-1,000 mg total) by mouth every 6 (six) hours as needed for moderate pain. 30 tablet 0  . albuterol (PROVENTIL HFA;VENTOLIN HFA) 108 (90 Base) MCG/ACT inhaler Inhale 2 puffs into the lungs every 6 (six) hours as needed for wheezing or shortness of breath. 1 Inhaler 2  . amiodarone (PACERONE) 200 MG tablet Take 1 tablet (200 mg total) by mouth daily. 30 tablet 2  . apixaban (ELIQUIS) 2.5 MG TABS tablet Take 1 tablet (2.5 mg total) by mouth 2 (two) times daily. 60 tablet 2  . Calcium Carbonate-Vitamin D (CALCIUM-D) 600-400 MG-UNIT TABS Take 1 tablet by mouth as needed (if needed).     . chlordiazePOXIDE (LIBRIUM) 10 MG capsule Take 10 mg by mouth 2 (two) times daily as needed for anxiety.     Marland Kitchen dextromethorphan-guaiFENesin (MUCINEX DM) 30-600 MG per 12 hr tablet Take 1 tablet by mouth daily as needed (sinus).     . fluticasone (FLONASE) 50 MCG/ACT nasal spray Place 2 sprays into the nose daily as needed for allergies.     . Glycerin-Hypromellose-PEG 400 ([facility] DRY EYE RELIEF) 0.2-0.2-1 % SOLN Place 1 drop  into both eyes as needed (dry eye).    . hydrochlorothiazide (MICROZIDE) 12.5 MG capsule Take 1 capsule (12.5 mg total) by mouth daily. 30 capsule 2  . HYDROcodone-acetaminophen (NORCO) 5-325 MG per tablet Take 1 tablet by mouth every 6 (six) hours as needed for moderate pain.     . hyoscyamine (LEVSIN SL) 0.125 MG SL tablet Place 0.125 mg under the tongue every 4 (four) hours as needed for cramping. Reported on 09/25/2015    . losartan (COZAAR) 50 MG tablet Take 1 tablet (50 mg total) by mouth daily. (Patient taking differently: Take 25-50 mg by  mouth as needed (if she does not feel well). ) 30 tablet 2  . meclizine (ANTIVERT) 50 MG tablet Take 0.5 tablets (25 mg total) by mouth 3 (three) times daily as needed. 30 tablet 0  . Multiple Vitamin (MULTIVITAMIN) capsule Take 1 capsule by mouth daily.    . propranolol ER (INDERAL LA) 120 MG 24 hr capsule Take 120 mg by mouth 2 (two) times daily.    . ranitidine (ZANTAC) 300 MG tablet Take 150 mg by mouth 2 (two) times daily.     . sertraline (ZOLOFT) 50 MG tablet Take 50 mg by mouth daily.    . clindamycin (CLEOCIN) 150 MG capsule Take 4 capsules by mouth as needed. As needed for dental procedures  0  . docusate sodium (COLACE) 100 MG capsule Take 1 capsule (100 mg total) by mouth daily for 10 days. 10 capsule 0  . oxyCODONE (ROXICODONE) 5 MG immediate release tablet Take 0.5 tablets (2.5 mg total) by mouth every 6 (six) hours as needed for severe pain or breakthrough pain. 15 tablet 0     Discharge Medications: Please see discharge summary for a list of discharge medications.  Relevant Imaging Results:  Relevant Lab Results:   Additional Information [id number]  Alphonse Guild Javoris Star, LCSWA

## 2018-09-04 NOTE — Progress Notes (Signed)
Physical Therapy Evaluation Patient Details Name: [name] MRN: [id number] DOB: 1926-01-07 Today's Date: 09/04/2018   History of Present Illness  Patient is 85 y/o female returning to ED due to inability to care for self at home. Patient in ED on 09/02/18 following a fall at home resulting in a R humeral fx. PMH includes recurrent falls, CKD, HTN, COPD, osteoporosis, orthostatic hypotension, and hx of cancer.   Clinical Impression  Patient presenting to ED with problems above and with deficits below. Patient with significant difficulty with functional mobiltiy secondary to weakness, unsteadiness, and inability to use RUE. Patient required minA for all bed mobility and modA +2 to stand. Attempted to take 1 step from bed however requiring modA+2 due to unsteadiness and heavy posterior lean therefore further mobility was deferred. Given functional mobility deficits and decreased caregiver assistance, recommending SNF level therapy to regain baseline mobility and strength. Patient will benefit from acute physical therapy to maximize independence and safety with functional mobility.     Follow Up Recommendations SNF;Supervision/Assistance - 24 hour    Equipment Recommendations  Other (comment)(TBD at next venue)    Recommendations for Other Services       Precautions / Restrictions Precautions Precautions: Fall Precaution Comments: Reports 3 falls in last month Required Braces or Orthoses: Sling;Splint/Cast Restrictions Weight Bearing Restrictions: Yes RUE Weight Bearing: Non weight bearing      Mobility  Bed Mobility Overal bed mobility: Needs Assistance Bed Mobility: Supine to Sit;Sit to Supine     Supine to sit: Min assist Sit to supine: Mod assist   General bed mobility comments: Patient required minA for trunk control and LE assist to sit EOB. Required modA for trunk control and LE assist to return to supine  Transfers Overall transfer level: Needs  assistance Equipment used: 1 person hand held assist Transfers: Sit to/from Stand Sit to Stand: Mod assist;+2 physical assistance         General transfer comment: Patient required modA +2 to stand for lift assist and steadying. Patient with signficant posterior lean. Patient bracing LEs on bed to help maintain balance  Ambulation/Gait Ambulation/Gait assistance: Mod assist;+2 physical assistance   Assistive device: 1 person hand held assist       General Gait Details: Patient took one step from bed with modA +2 for steadying. Patient standing in narrow BOS and able to widen BOS following cues. Required external support to maintain balance  Stairs            Wheelchair Mobility    Modified Rankin (Stroke Patients Only)       Balance Overall balance assessment: Needs assistance Sitting-balance support: Feet unsupported;No upper extremity supported Sitting balance-Leahy Scale: Good     Standing balance support: Single extremity supported Standing balance-Leahy Scale: Poor Standing balance comment: external support required to maintain balance                             Pertinent Vitals/Pain Pain Assessment: 0-10 Pain Score: 5  Pain Location: R arm Pain Descriptors / Indicators: Aching;Discomfort;Grimacing;Guarding Pain Intervention(s): Limited activity within patient's tolerance;Monitored during session;Repositioned    Home Living Family/patient expects to be discharged to:: Private residence Living Arrangements: Spouse/significant other Available Help at Discharge: Family;Available PRN/intermittently Type of Home: House Home Access: Stairs to enter Entrance Stairs-Rails: Left Entrance Stairs-Number of Steps: 5 Home Layout: One level Home Equipment: [name] - single point Additional Comments: Patient lives with 70 y/o husband who is  unable to provide physical assist    Prior Function Level of Independence: Independent with assistive device(s)          Comments: patient uses a cane at baseline. Since falling has been unable to ambulate without assist. Has been unable to perform ADLs tasks.     Hand Dominance   Dominant Hand: Right    Extremity/Trunk Assessment   Upper Extremity Assessment Upper Extremity Assessment: RUE deficits/detail RUE Deficits / Details: R humeral fx. Patient in sling    Lower Extremity Assessment Lower Extremity Assessment: Generalized weakness    Cervical / Trunk Assessment Cervical / Trunk Assessment: Kyphotic  Communication   Communication: No difficulties  Cognition Arousal/Alertness: Awake/alert Behavior During Therapy: WFL for tasks assessed/performed Overall Cognitive Status: Impaired/Different from baseline Area of Impairment: Memory                     Memory: Decreased recall of precautions;Decreased short-term memory         General Comments: Patient reporting inconsitencies regarding home information. Daughter in room correcting home information      General Comments General comments (skin integrity, edema, etc.): Patient daughter in room during session. Patient reports she has not had a bath since humeral fx due to difficulty with mobility.     Exercises     Assessment/Plan    PT Assessment Patient needs continued PT services  PT Problem List Decreased strength;Decreased range of motion;Decreased activity tolerance;Decreased balance;Decreased mobility;Decreased cognition;Decreased knowledge of use of DME;Decreased safety awareness;Decreased knowledge of precautions;Pain       PT Treatment Interventions DME instruction;Gait training;Functional mobility training;Therapeutic activities;Therapeutic exercise;Balance training;Patient/family education;Cognitive remediation    PT Goals (Current goals can be found in the Care Plan section)  Acute Rehab PT Goals Patient Stated Goal: to get stronger PT Goal Formulation: With patient Time For Goal Achievement:  09/18/18 Potential to Achieve Goals: Fair    Frequency Min 2X/week   Barriers to discharge Decreased caregiver support      Co-evaluation               AM-PAC PT "6 Clicks" Mobility  Outcome Measure Help needed turning from your back to your side while in a flat bed without using bedrails?: A Little Help needed moving from lying on your back to sitting on the side of a flat bed without using bedrails?: A Lot Help needed moving to and from a bed to a chair (including a wheelchair)?: A Lot Help needed standing up from a chair using your arms (e.g., wheelchair or bedside chair)?: A Lot Help needed to walk in hospital room?: A Lot Help needed climbing 3-5 steps with a railing? : Total 6 Click Score: 12    End of Session Equipment Utilized During Treatment: Gait belt Activity Tolerance: Patient tolerated treatment well Patient left: in bed;with call bell/phone within reach;with family/visitor present Nurse Communication: Mobility status PT Visit Diagnosis: Unsteadiness on feet (R26.81);Muscle weakness (generalized) (M62.81);Difficulty in walking, not elsewhere classified (R26.2);History of falling (Z91.81)    Time: 0973-5329 PT Time Calculation (min) (ACUTE ONLY): 19 min   Charges:   PT Evaluation $PT Eval Moderate Complexity: 1 Mod            Erick Blinks, SPT  Erick Blinks 09/04/2018, 5:36 PM

## 2018-09-04 NOTE — ED Provider Notes (Signed)
[facility] Provider Note   CSN: [id number] Arrival date & time: 09/04/18  1156     History   Chief Complaint Chief Complaint  Patient presents with  . Fall    HPI [name] is a 85 y.o. female.  HPI  85 year old female presents for help possible long-term acute care.  The patient's history is obtained with patient and family at bedside.  She fell and broke her right humerus 2 days ago.  Was discharged home but told that if she was having difficulty at home caring for self then she could come back here for possible rehab placement.  The patient has had adequate pain control with the oxycodone at home.  However she and her 45 year old husband live at home alone.  She has chronic weakness that is not worse than typical but with the dominant arm being affected she is having a significant time getting up and going to the bathroom.  Feels like she cannot care for herself at home.  Went to [employer] today and the splint was readjusted as it was too tight originally.  No numbness in the hand.  No new or concerning symptoms such as dizziness, chest pain, shortness of breath.  Past Medical History:  Diagnosis Date  . Allergy   . Cancer ([place])    follicular lymphoma  . Chronic atrial fibrillation 11/18/2017  . Chronic kidney disease   . COPD (chronic obstructive pulmonary disease) ([place])   . Depression   . DJD (degenerative joint disease)   . Dysuria 09/25/2015  . Essential tremor   . GERD (gastroesophageal reflux disease)   . Heart murmur    had echo 20 yr ago-not sure where  . Hx of colonic polyp 10/02/05  . Hypertension   . MGUS (monoclonal gammopathy of unknown significance) 10/24/2015  . MVP (mitral valve prolapse)   . Orthostatic hypotension   . Osteoporosis   . Paroxysmal atrial fibrillation (HCC)   . Venous insufficiency     Patient Active Problem List   Diagnosis Date Noted  . Orthostatic hypotension 09/01/2018  .  Hypokalemia 05/19/2018  . Deficiency anemia 11/18/2017  . Pancytopenia, acquired ([place]) 05/13/2017  . Hypertension 01/30/2017  . GERD (gastroesophageal reflux disease) 01/30/2017  . Monoclonal gammopathy of unknown significance (MGUS) 01/30/2017  . Chest pain 01/30/2017  . Follicular (B cell) lymphoma ([place]) 01/30/2017  . Peripheral neuropathy 01/30/2017  . Essential tremor 01/30/2017  . CKD (chronic kidney disease) stage 3, GFR 30-59 ml/min (HCC) 01/30/2017  . Depression 01/30/2017  . DJD (degenerative joint disease) 01/30/2017  . COPD (chronic obstructive pulmonary disease) ([place]) 01/30/2017  . 42mm right ?? Lung nodule seen on imaging study 01/30/2017  . Hypomagnesemia 01/30/2017  . Elevated troponin   . Full body hives 05/02/2016  . MGUS (monoclonal gammopathy of unknown significance) 10/24/2015  . Intermittent vertigo 09/26/2014  . Systolic hypertension 95/62/1308  . History of B-cell lymphoma 12/11/2011  . Lymphadenopathy 10/25/2011    Past Surgical History:  Procedure Laterality Date  . APPENDECTOMY    . CATARACT EXTRACTION Bilateral   . COLONOSCOPY    . DILATION AND CURETTAGE OF UTERUS     several  . OVARIAN CYST REMOVAL       OB History   No obstetric history on file.      Home Medications    Prior to Admission medications   Medication Sig Start Date End Date Taking? Authorizing Provider  acetaminophen (TYLENOL) 500 MG tablet Take  1-2 tablets (500-1,000 mg total) by mouth every 6 (six) hours as needed for moderate pain. 09/02/18  Yes Duffy Bruce, MD  albuterol (PROVENTIL HFA;VENTOLIN HFA) 108 (90 Base) MCG/ACT inhaler Inhale 2 puffs into the lungs every 6 (six) hours as needed for wheezing or shortness of breath. 02/01/17  Yes Oswald Hillock, MD  amiodarone (PACERONE) 200 MG tablet Take 1 tablet (200 mg total) by mouth daily. 02/02/17  Yes Oswald Hillock, MD  apixaban (ELIQUIS) 2.5 MG TABS tablet Take 1 tablet (2.5 mg total) by mouth 2 (two) times daily. 02/01/17   Yes Oswald Hillock, MD  Calcium Carbonate-Vitamin D (CALCIUM-D) 600-400 MG-UNIT TABS Take 1 tablet by mouth as needed (if needed).    Yes [provider]  chlordiazePOXIDE (LIBRIUM) 10 MG capsule Take 10 mg by mouth 2 (two) times daily as needed for anxiety.    Yes [provider]  dextromethorphan-guaiFENesin (MUCINEX DM) 30-600 MG per 12 hr tablet Take 1 tablet by mouth daily as needed (sinus).    Yes [provider]  fluticasone (FLONASE) 50 MCG/ACT nasal spray Place 2 sprays into the nose daily as needed for allergies.    Yes [provider]  Glycerin-Hypromellose-PEG 400 ([facility] DRY EYE RELIEF) 0.2-0.2-1 % SOLN Place 1 drop into both eyes as needed (dry eye).   Yes [provider]  hydrochlorothiazide (MICROZIDE) 12.5 MG capsule Take 1 capsule (12.5 mg total) by mouth daily. 02/02/17  Yes Oswald Hillock, MD  HYDROcodone-acetaminophen (NORCO) 5-325 MG per tablet Take 1 tablet by mouth every 6 (six) hours as needed for moderate pain.    Yes [provider]  hyoscyamine (LEVSIN SL) 0.125 MG SL tablet Place 0.125 mg under the tongue every 4 (four) hours as needed for cramping. Reported on 09/25/2015   Yes [provider]  losartan (COZAAR) 50 MG tablet Take 1 tablet (50 mg total) by mouth daily. Patient taking differently: Take 25-50 mg by mouth as needed (if she does not feel well).  02/02/17  Yes Oswald Hillock, MD  meclizine (ANTIVERT) 50 MG tablet Take 0.5 tablets (25 mg total) by mouth 3 (three) times daily as needed. 06/07/14  Yes Pamella Pert, MD  Multiple Vitamin (MULTIVITAMIN) capsule Take 1 capsule by mouth daily.   Yes [provider]  propranolol ER (INDERAL LA) 120 MG 24 hr capsule Take 120 mg by mouth 2 (two) times daily. 07/28/18  Yes [provider]  ranitidine (ZANTAC) 300 MG tablet Take 150 mg by mouth 2 (two) times daily.    Yes [provider]  sertraline (ZOLOFT) 50 MG tablet Take 50 mg by mouth  daily.   Yes [provider]  clindamycin (CLEOCIN) 150 MG capsule Take 4 capsules by mouth as needed. As needed for dental procedures 03/16/18   [provider]  docusate sodium (COLACE) 100 MG capsule Take 1 capsule (100 mg total) by mouth daily for 10 days. 09/02/18 09/12/18  Duffy Bruce, MD  oxyCODONE (ROXICODONE) 5 MG immediate release tablet Take 0.5 tablets (2.5 mg total) by mouth every 6 (six) hours as needed for severe pain or breakthrough pain. 09/02/18   Duffy Bruce, MD    Family History Family History  Problem Relation Age of Onset  . Heart murmur Mother   . Stroke Mother 75  . Stroke Father 64    Social History Social History   Tobacco Use  . Smoking status: Never Smoker  . Smokeless tobacco: Never Used  Substance Use  Topics  . Alcohol use: No    Alcohol/week: 0.0 standard drinks  . Drug use: No     Allergies   Amoxicillin; Aspirin; Benadryl [diphenhydramine hcl]; Ibuprofen; Sulfa antibiotics; Latex; Peanut-containing drug products; Strawberry c [ascorbate]; Sulindac; and Penicillins   Review of Systems Review of Systems  Respiratory: Negative for shortness of breath.   Cardiovascular: Negative for chest pain.  Musculoskeletal: Positive for arthralgias.  Neurological: Positive for weakness (generalized).     Physical Exam Updated Vital Signs BP (!) 185/80 (BP Location: Left Arm)   Pulse (!) 56   Temp 98.2 F (36.8 C) (Oral)   Resp 14   SpO2 98%   Physical Exam Vitals signs and nursing note reviewed.  Constitutional:      General: She is not in acute distress.    Appearance: She is well-developed. She is not ill-appearing or diaphoretic.  HENT:     Head: Normocephalic and atraumatic.     Right Ear: External ear normal.     Left Ear: External ear normal.     Nose: Nose normal.  Eyes:     General:        Right eye: No discharge.        Left eye: No discharge.  Pulmonary:     Effort: Pulmonary effort is normal.    Abdominal:     General: There is no distension.  Musculoskeletal:     Comments: Right arm is in sling and splint. Fingers are warm, though swollen.  Skin:    General: Skin is warm and dry.  Neurological:     Mental Status: She is alert.  Psychiatric:        Mood and Affect: Mood is not anxious.      ED Treatments / Results  Labs (all labs ordered are listed, but only abnormal results are displayed) Labs Reviewed - No data to display  EKG EKG Interpretation  Date/Time:  Friday September 04 2018 12:02:13 EST Ventricular Rate:  56 PR Interval:  210 QRS Duration: 84 QT Interval:  446 QTC Calculation: 430 R Axis:   -64 Text Interpretation:  Sinus bradycardia with marked sinus arrhythmia with 1st degree A-V block Left axis deviation Septal infarct , age undetermined Abnormal ECG no significant change since Sep 02 2018 Confirmed by Sherwood Gambler [phone]) on 09/04/2018 12:11:32 PM   Radiology Dg Chest 2 View  Result Date: 09/02/2018 CLINICAL DATA:  Right chest wall pain EXAM: CHEST - 2 VIEW COMPARISON:  02/11/2017 FINDINGS: Cardiomegaly. There is hyperinflation of the lungs compatible with COPD. No confluent opacities, effusions or edema. No acute bony abnormality. IMPRESSION: Cardiomegaly, COPD.  No active disease. Electronically Signed   By: Rolm Baptise M.D.   On: 09/02/2018 21:04   Dg Elbow Complete Right  Result Date: 09/02/2018 CLINICAL DATA:  Fall.  Right elbow swelling and pain. EXAM: RIGHT ELBOW - COMPLETE 3+ VIEW COMPARISON:  None. FINDINGS: There is deformity in the distal right humerus compatible with distal humeral fracture. Fracture fragments are moderately displaced. No subluxation or dislocation. IMPRESSION: Displaced, comminuted distal right humeral fracture. Electronically Signed   By: Rolm Baptise M.D.   On: 09/02/2018 21:06   Ct Head Wo Contrast  Result Date: 09/02/2018 CLINICAL DATA:  Fall. EXAM: CT HEAD WITHOUT CONTRAST CT CERVICAL SPINE WITHOUT CONTRAST  TECHNIQUE: Multidetector CT imaging of the head and cervical spine was performed following the standard protocol without intravenous contrast. Multiplanar CT image reconstructions of the cervical spine were also generated. COMPARISON:  None.  FINDINGS: CT HEAD FINDINGS Brain: There is atrophy and chronic small vessel disease changes. No acute intracranial abnormality. Specifically, no hemorrhage, hydrocephalus, mass lesion, acute infarction, or significant intracranial injury. Vascular: No hyperdense vessel or unexpected calcification. Skull: No acute calvarial abnormality. Sinuses/Orbits: Opacification of the right maxillary sinus and ethmoid air cells. Mastoid air cells clear. Orbital soft tissues unremarkable. Other: None CT CERVICAL SPINE FINDINGS Alignment: No subluxation Skull base and vertebrae: No acute fracture. No primary bone lesion or focal pathologic process. Soft tissues and spinal canal: No prevertebral fluid or swelling. No visible canal hematoma. Disc levels: Advanced bilateral degenerative facet disease. Moderate degenerative disc disease, most pronounced at C5-6. Upper chest: Biapical scarring.  No acute findings. Other: Carotid artery calcifications.  No acute findings. IMPRESSION: Atrophy, chronic microvascular disease. No acute intracranial abnormality. Cervical spondylosis.  No acute bony abnormality. Electronically Signed   By: Rolm Baptise M.D.   On: 09/02/2018 20:33   Ct Cervical Spine Wo Contrast  Result Date: 09/02/2018 CLINICAL DATA:  Fall. EXAM: CT HEAD WITHOUT CONTRAST CT CERVICAL SPINE WITHOUT CONTRAST TECHNIQUE: Multidetector CT imaging of the head and cervical spine was performed following the standard protocol without intravenous contrast. Multiplanar CT image reconstructions of the cervical spine were also generated. COMPARISON:  None. FINDINGS: CT HEAD FINDINGS Brain: There is atrophy and chronic small vessel disease changes. No acute intracranial abnormality. Specifically, no  hemorrhage, hydrocephalus, mass lesion, acute infarction, or significant intracranial injury. Vascular: No hyperdense vessel or unexpected calcification. Skull: No acute calvarial abnormality. Sinuses/Orbits: Opacification of the right maxillary sinus and ethmoid air cells. Mastoid air cells clear. Orbital soft tissues unremarkable. Other: None CT CERVICAL SPINE FINDINGS Alignment: No subluxation Skull base and vertebrae: No acute fracture. No primary bone lesion or focal pathologic process. Soft tissues and spinal canal: No prevertebral fluid or swelling. No visible canal hematoma. Disc levels: Advanced bilateral degenerative facet disease. Moderate degenerative disc disease, most pronounced at C5-6. Upper chest: Biapical scarring.  No acute findings. Other: Carotid artery calcifications.  No acute findings. IMPRESSION: Atrophy, chronic microvascular disease. No acute intracranial abnormality. Cervical spondylosis.  No acute bony abnormality. Electronically Signed   By: Rolm Baptise M.D.   On: 09/02/2018 20:33   Dg Humerus Right  Result Date: 09/02/2018 CLINICAL DATA:  Fall, right elbow pain EXAM: RIGHT HUMERUS - 2+ VIEW COMPARISON:  Right elbow performed today. FINDINGS: Deformity in the distal right humerus compatible with comminuted, displaced fracture. No visible subluxation or dislocation. IMPRESSION: Comminuted, displaced distal right humeral fracture. Electronically Signed   By: Rolm Baptise M.D.   On: 09/02/2018 21:06   Dg Hip Unilat W Or Wo Pelvis 2-3 Views Right  Result Date: 09/02/2018 CLINICAL DATA:  Fall, right hip pain EXAM: DG HIP (WITH OR WITHOUT PELVIS) 2-3V RIGHT COMPARISON:  08/07/2018 pelvic radiograph FINDINGS: No pelvic fracture or diastasis. No evidence of right hip fracture or dislocation. Mild-to-moderate bilateral hip osteoarthritis, unchanged. No suspicious focal osseous lesions. Degenerative changes in the visualized lower lumbar spine. IMPRESSION: No evidence of fracture or  right hip malalignment. Mild-to-moderate bilateral hip osteoarthritis. Electronically Signed   By: Ilona Sorrel M.D.   On: 09/02/2018 21:10    Procedures Procedures (including critical care time)  Medications Ordered in ED Medications  amiodarone (PACERONE) tablet 200 mg (has no administration in time range)  apixaban (ELIQUIS) tablet 2.5 mg (has no administration in time range)  acetaminophen (TYLENOL) tablet 500-1,000 mg (has no administration in time range)  albuterol (PROVENTIL HFA;VENTOLIN HFA) 108 (90  Base) MCG/ACT inhaler 2 puff (has no administration in time range)  Calcium-D 600-400 MG-UNIT TABS 1 tablet (has no administration in time range)  chlordiazePOXIDE (LIBRIUM) capsule 10 mg (has no administration in time range)  docusate sodium (COLACE) capsule 100 mg (has no administration in time range)  Glycerin-Hypromellose-PEG 400 0.2-0.2-1 % SOLN 1 drop (has no administration in time range)  hydrochlorothiazide (MICROZIDE) capsule 12.5 mg (has no administration in time range)  HYDROcodone-acetaminophen (NORCO/VICODIN) 5-325 MG per tablet 1 tablet (has no administration in time range)  hyoscyamine (LEVSIN SL) SL tablet 0.125 mg (has no administration in time range)  losartan (COZAAR) tablet 50 mg (has no administration in time range)  meclizine (ANTIVERT) tablet 25 mg (has no administration in time range)  sertraline (ZOLOFT) tablet 50 mg (has no administration in time range)  famotidine (PEPCID) tablet 10 mg (has no administration in time range)  propranolol ER (INDERAL LA) 24 hr capsule 120 mg (has no administration in time range)  multivitamin capsule 1 capsule (has no administration in time range)     Initial Impression / Assessment and Plan / ED Course  I have reviewed the triage vital signs and the nursing notes.  Pertinent labs & imaging results that were available during my care of the patient were reviewed by me and considered in my medical decision making (see chart for  details).     Patient presents needing placement.  She is unable to care for herself at home given the new injury to her right arm.  She has no new complaints otherwise medically.  She is hypertensive but this is a chronic problem.  Generalized weakness is not new.  She had labs a couple days ago with the fall that were reviewed and unchanged compared to baseline.  I discussed with her orthopedist, Dr. Percell Miller, who had originally tried to admit her but he states this was for social reasons only.  She is not fit to go home which is his reason to try and admit.  I discussed with case management/social work, will work on placing patient.  Home meds have been ordered.  Final Clinical Impressions(s) / ED Diagnoses   Final diagnoses:  Closed fracture of distal end of right humerus with routine healing, unspecified fracture morphology, subsequent encounter    ED Discharge Orders    None       Sherwood Gambler, MD 09/04/18 1547

## 2018-09-04 NOTE — Progress Notes (Signed)
CSW aware of consult. CSW spoke with EDP who stated patient was recently here after a fall that resulted in an arm fracture. Patient discharged home where she lives with her 83 year old husband but he is unable to care for patient. CSW spoke with patient's daughter via phone who was at bedside regarding current situation. Patient's daughter confirmed the above information and stated she travels for work and is only here until Monday. CSW explained barriers to getting patient placed from the ED as patient has [facility] but they do not work on the weekends so placement would not be obtained until Monday or Tuesday. CSW unsure if EDP will hold patient in ED until placement is found. Patient's daughter became upset as she was under the impression patient would be medically admitted. CSW spoke with EDP who stated there was not a reason for patient to be admitted inpatient. Per EDP, he would follow up with patient's orthopedist and see what they say. Per EDP, if there is still no reason to admit patient he would put in a PT consult.  3:20pm- CSW informed EDP has placed PT consult. CSW to leave handoff for 2nd shift CSW.  Ollen Barges, Divide Work Department  [employer]   [phone]

## 2018-09-04 NOTE — Progress Notes (Signed)
2nd shift ED CSW received a handoff from the 1st shift [facility] ED CSW.   Pt is from home after recently D/C'ing home with a fractured arm.  Per the handoff pt returned to the ED and is requesting placement.  Pt's daughter stated pt's spouse, with whom pt lives is 83 yo (pt is 78) and pt's spouse can not care for the pt at all, per pt's daughter.  Pt's daughter stated pt's daughter will be here for the weekend, is leaving for work which requires travel on Monday and will not be able to care for the pt beyond Sunday.  EDP was consulted and stated pt must remain in the ED this weekend to seek SNF placement as pt cannot return home, per EDP.  A PT consult had been placed and a recommendation for SNF was made with PT saying pt requires a 2-person minimum assist for transfer, per the ED RN CM.  CSW confirmed EDP's directive and consulted with CSW leadership who stated SNF authorization for short-term placement for a fractured arm is questionable and requested CSW speak to pt's daughter about long-term placement as an option using Medicaid should pt not meet auth critieria for a SNF stay by [facility] Medicare.  CSW spoke to daughter who stated she and the pt are seeking short-term placement in a SNF for rehab only and that long-term placement is not an option.    CSW spoke to pt who provided verbal permission for the CSW to create an FL-2 and refer pt out to SNF's in the [place] area and to speak to the pt's daughter in regards to all discharge/placement matters and the pt was agreeable and provided permission.  Pt/pt's family was counseled by the pt that if placement was not found quickly due to time constraints in the ED then pt would have to return home to seek or await placement.  CSW also counseled pt's daughter that if placement is found by a SNF and that this is not the family's preferred SNF, then the pt would have to either go to that SNF and possibly transfer to a preferred SNF on a later date or  return home and the pt's daughter voiced understanding.  CSW stated CSW would proceed and would keep the pt's daughter updated and pt's daughter stated, "Perfect".  CSW called back after speaking to [place] and cautioned pt's daughter against having false hope that pt would be provided with aurthorized SNF stay days by [facility] and counseled that while pt may receive authorized SNF days, the pt may receive only a few at best and that if this happens the pt/pt's family would need to have a plan in place should pt D/C home from a SNF after a short stay and that even if pt is given a 10, 20, or 30 day stay the pt would have to then prepare to be returned home.  Pt's daughter stated in a very icy tone while presenting as tearful via phone, "Don't you understand that her husband is 26 years old?"  CSW stated he was aware of this, but that the pt cannot remain in the ED or be in the ED for housing needs only, that this is an emergency department and that the CSW was respectfully cautioning the pt's daughter that having a secondary plan in place would be recommended by the CSW.  Pt's daughter voiced understanding.  CSW again cautioned daughter if pt cannot receive ins auth that pt would have to return home and  asked if the pt's daughter or the pt can private pay.  Pt's daughter in an icey tone stated, "No I can not private pay, that is why we have insurance so we don't private pay".  CSW stated he understood that but if pt is denied insurance and pt'family want placement that is the only other option at the skilled level.  Pt's daughter stated she understood and hung up.  Pt's daughter called back and asked ho much private pay would cost and CSW explained it varied between, 6, 8, 12, 16 thousand a month depending on the facility but that respite care is an option at an ALF and after explaining the difference in cost the pt's daughter state that this may be a good option but still was speaking to CSW in a tone  of voice signifying pt's daughter's displeasure at the news she was receiving from the CSW.  Pt's daughter stated she understood, CSW explained he would proceed with seeking placement and explained again to the pt's daughter the CSW was merely cautioning the pt's daughter against false hope, and that both would hope for the best and pt's daughter voiced understanding hung up.  CSW will continue to follow for D/C needs.  Alphonse Guild. Lorrin Bodner, LCSW, LCAS, CSI Clinical Social Worker Ph: [phone]

## 2018-09-04 NOTE — ED Notes (Signed)
Pt needed assistance going to the bathroom, this RN and Denzil Magnuson accompanied pt to the bathroom via wheelchair. Pt is two assist, very unsteady on feet.

## 2018-09-04 NOTE — Care Management (Signed)
ED CM discussed with [facility] ED CSW working on placement  from the ED. Patient needs 2 person assist and is unable to care for stand or ambulate or use upper extremities requires the assistance of at least 2 person. Updated ED Charge Nurse that patient will be boarding while working on Rehab placement.Marland Kitchen

## 2018-09-04 NOTE — Clinical Social Work Note (Signed)
Clinical Social Work Assessment  Patient Details  Name: [name] MRN: [id number] Date of Birth: July 05, 1926  Date of referral:  09/04/18               Reason for consult:  Facility Placement                Permission sought to share information with:  Chartered certified accountant granted to share information::  Yes, Verbal Permission Granted  Name::        Agency::     Relationship::     Contact Information:     Housing/Transportation Living arrangements for the past 2 months:  Single Family Home Source of Information:  Adult Children Patient Interpreter Needed:  None Criminal Activity/Legal Involvement Pertinent to Current Situation/Hospitalization:    Significant Relationships:  Adult Children Lives with:  Spouse Do you feel safe going back to the place where you live?  No Need for family participation in patient care:  Yes (Comment)  Care giving concerns:  2nd shift ED CSW received a handoff from the 1st shift [facility] ED CSW.   Pt is from home after recently D/C'ing home with a fractured arm.  Per the handoff pt returned to the ED and is requesting placement.  Pt's daughter stated pt's spouse, with whom pt lives is 83 yo (pt is 85) and pt's spouse can not care for the pt at all, per pt's daughter.  Pt's daughter stated pt's daughter will be here for the weekend, is leaving for work which requires travel on Monday and will not be able to care for the pt beyond Sunday.  EDP was consulted and stated pt must remain in the ED this weekend to seek SNF placement as pt cannot return home, per EDP.  A PT consult had been placed and a recommendation for SNF was made with PT saying pt requires a 2-person minimum assist for transfer, per the ED RN CM.  CSW confirmed EDP's directive and consulted with CSW leadership who stated SNF authorization for short-term placement for a fractured arm is questionable and requested CSW speak to pt's daughter about long-term placement as an  option using Medicaid should pt not meet auth critieria for a SNF stay by [facility] Medicare.  CSW spoke to daughter who stated she and the pt are seeking short-term placement in a SNF for rehab only and that long-term placement is not an option.    CSW spoke to pt who provided verbal permission for the CSW to create an FL-2 and refer pt out to SNF's in the [place] area and to speak to the pt's daughter in regards to all discharge/placement matters and the pt was agreeable and provided permission.  Pt/pt's family was counseled by the pt that if placement was not found quickly due to time constraints in the ED then pt would have to return home to seek or await placement.  CSW also counseled pt's daughter that if placement is found by a SNF and that this is not the family's preferred SNF, then the pt would have to either go to that SNF and possibly transfer to a preferred SNF on a later date or return home and the pt's daughter voiced understanding.  CSW stated CSW would proceed and would keep the pt's daughter updated and pt's daughter stated, "Perfect".  CSW called back after speaking to [place] and cautioned pt's daughter against having false hope that pt would be provided with aurthorized SNF stay days by [facility]  and counseled that while pt may receive authorized SNF days, the pt may receive only a few at best and that if this happens the pt/pt's family would need to have a plan in place should pt D/C home from a SNF after a short stay and that even if pt is given a 10, 20, or 30 day stay the pt would have to then prepare to be returned home.  Pt's daughter stated in a very icy tone while presenting as tearful via phone, "Don't you understand that her husband is 4 years old?"  CSW stated he was aware of this, but that the pt cannot remain in the ED or be in the ED for housing needs only, that this is an emergency department and that the CSW was respectfully cautioning the pt's daughter that  having a secondary plan in place would be recommended by the CSW.  Pt's daughter voiced understanding.  CSW again cautioned daughter if pt cannot receive ins auth that pt would have to return home and asked if the pt's daughter or the pt can private pay.  Pt's daughter in an icey tone stated, "No I can not private pay, that is why we have insurance, so we don't have to private pay".  CSW stated he understood that but if pt is denied insurance and pt'family want placement that is the only other option at the skilled level.  Pt's daughter stated she understood and hung up.  Pt's daughter called back and asked ho much private pay would cost and CSW explained it varied between, 6, 8, 12, 16 thousand a month depending on the facility but that respite care is an option at an ALF and after explaining the difference in cost the pt's daughter state that this may be a good option but still was speaking to CSW in a tone of voice signifying pt's daughter's displeasure at the news she was receiving from the CSW.  Pt's daughter stated she understood, CSW explained he would proceed with seeking placement and explained again to the pt's daughter the CSW was merely cautioning the pt's daughter against false hope, and that both would hope for the best and pt's daughter voiced understanding hung up.   Social Worker assessment / plan:  CSW met with pt and confirmed pt's plan to be discharged to a SNF for rehab at discharge.  CSW provided active listening and validated pt'sdaughter's concerns for safety if pt's is not accepted to a facility.   CSW will complete FL-2 and send referrals out to SNF facilities via the hub per pt's request.  Pt has been living independently prior to being admitted to [facility].  Employment status:  Retired Surveyor, [employer] Care PT Recommendations:  [place] / Referral to community resources:     Patient/Family's Response to care:  Patient alert and  oriented.  Patient and pt's daughter agreeable to plan.  Pt's daughter supportive and strongly involved in pt.'s care.  Pt.'s daughtervpleasant and appreciated CSW intervention.    Patient/Family's Understanding of and Emotional Response to Diagnosis, Current Treatment, and Prognosis:  Still assessing  ssssssssssss  Emotional Assessment Appearance:    Attitude/Demeanor/Rapport:    Affect (typically observed):  Calm, Pleasant Orientation:  Oriented to Self, Oriented to Place, Oriented to  Time, Oriented to Situation Alcohol / Substance use:    Psych involvement (Current and /or in the community):     Discharge Needs  Concerns to be addressed:  No discharge needs identified Readmission within  the last 30 days:  No Current discharge risk:  None Barriers to Discharge:  No Barriers Identified   Claudine Mouton, LCSWA 09/04/2018, 7:17 PM

## 2018-09-04 NOTE — Progress Notes (Signed)
CSW received pt's PASSR # from [facility] MUST: [id number]  CSW will continue to follow for D/C needs.  Alphonse Guild. Jahmez Bily, LCSW, LCAS, CSI Clinical Social Worker Ph: [phone]

## 2018-09-05 ENCOUNTER — Encounter (HOSPITAL_COMMUNITY): Payer: Self-pay

## 2018-09-05 ENCOUNTER — Other Ambulatory Visit: Payer: Self-pay

## 2018-09-05 ENCOUNTER — Emergency Department (HOSPITAL_COMMUNITY): Payer: Medicare Other

## 2018-09-05 NOTE — ED Notes (Signed)
Breakfast ordered Diet regular

## 2018-09-05 NOTE — ED Notes (Signed)
This EMT assisted pt to bathroom. Pt very unsteady on her feet and needs a two-person assist when going to the bathroom/moving around. Blood noted in the pt's bed coming from her right arm. RN notified.

## 2018-09-05 NOTE — Progress Notes (Addendum)
1:38PM: [facility] unable to get authorization any quicker, patient will likely not be able to get [employer] to authorize SNF coverage until Monday.  CSW met with patient and daughter. Patient and daughter reviewed list of accepted beds and requested [facility]. CSW contacted Karen at [facility] and due to her insurance being [employer] she would not have authorization today. CSW spoke with patient and daughter and they declined to consider other facilities at this time. 

## 2018-09-05 NOTE — ED Notes (Signed)
Pt lying on bed w/head of bed elevated - right arm noted to be in sling and splint intact - dry, intact. Daughter at bedside - requesting pain med for pt d/t pt c/o pain.

## 2018-09-05 NOTE — Care Management (Addendum)
ED CM discussed with ED CSW transitional care plan for Rehab  Placement. CSW has received a bed offer but the facility will not be able to admit patient until Monday, CSW still working with family exploring other facility who may be able to admit today. ED CSW continues to  following for safe transitional care plan, will keep ED staff updated.   Wendi Maya RN BSN NCM [phone]

## 2018-09-05 NOTE — Progress Notes (Signed)
Orthopedic Tech Progress Note Patient Details:  [name] 1925-09-01 [id number] RN called patient was bleeding heavily through the splint she had on.. I cleaned up her arm put some xeroform petrolatum and a abdominal pad on before applying the splint to help with the bleeding. Ortho Devices Type of Ortho Device: Post (long) splint Splint Material: Plaster Ortho Device/Splint Location: right arm Ortho Device/Splint Interventions: Adjustment, Application, Ordered   Post Interventions Patient Tolerated: Well Instructions Provided: Care of device, Adjustment of device   [name] 09/05/2018, 3:07 PM

## 2018-09-05 NOTE — ED Notes (Signed)
Ordered impatient bed

## 2018-09-05 NOTE — ED Notes (Signed)
Patient transported to CT 

## 2018-09-06 NOTE — ED Notes (Signed)
Pt lying on bed w/head of bed elevated. Daughter leaving at this time. Call bell w/in reach of pt.

## 2018-09-06 NOTE — ED Notes (Signed)
Family at bedside. 

## 2018-09-06 NOTE — ED Notes (Signed)
Pt states her BP spikes at times and that her dr does not want to put her on additional BP meds d/t is low at times. Pt sitting up in recliner watching after assisted w/bath. Call bell w/in reach.

## 2018-09-06 NOTE — ED Notes (Signed)
Right arm noted to be in splint and sling. Pt able to wiggle fingers w/o difficulty. Cap refill less than 3 seconds. Breakfast tray set up for pt.

## 2018-09-06 NOTE — ED Notes (Signed)
Pt sitting in recliner. Daughter leaving to eat lunch - will return.

## 2018-09-06 NOTE — ED Notes (Signed)
Breakfast Tray Ordered. 

## 2018-09-06 NOTE — ED Notes (Signed)
Pt assisted to bathroom and back to room. Family member at bedside.

## 2018-09-06 NOTE — Progress Notes (Addendum)
11:26AM: CSW was informed that the provider of choice would be [facility]. CSW left [facility] admission phone line a voicemail requesting a call back.  CSW received call from patient's daughter stating she made an unannounced visit at [facility] and requested to cancel her mother's referral. CSW informed daughter that he will cancel and provide updated list of accepted facilities.

## 2018-09-07 NOTE — ED Notes (Signed)
Breakfast Tray Ordered. 

## 2018-09-07 NOTE — Progress Notes (Addendum)
2nd shift ED CSW received a handoff from the 1st shift [facility] ED CSW.   CSW called [facility] Winona Legato in admissions for an update to see if the ins Josem Kaufmann has been received or approved.  CSW awaiting return call from [facility].  CSW received a phone call from [place] at [facility] stating auth hasn't beedn approved and it will not likely be approved until 2/17.  Pt's daughter updated at [phone] and pt's daughter stated she will be I meetings all day on 2/18, but will be able to respond ASAP but would like staff to know she is agreeable to the pt transporting to [facility] if or when the authorization is approved.  CSW will continue to follow for D/C needs.  Alphonse Guild. Jelesa Mangini, LCSW, LCAS, CSI Clinical Social Worker Ph: [phone]

## 2018-09-07 NOTE — ED Provider Notes (Signed)
85 year old female here awaiting placement.  Patient is sleeping in exam bed in no acute distress.  No changes overnight per nursing staff.  Vitals:   09/06/18 1806 09/06/18 2115  BP: (!) 143/64 (!) 155/90  Pulse: (!) 57 (!) 57  Resp: 18 19  Temp: 98.4 F (36.9 C)   SpO2: 97% 100%     Okey Regal, PA-C 09/07/18 0901    Sherwood Gambler, MD 09/07/18 1024

## 2018-09-07 NOTE — Progress Notes (Addendum)
CSW received handoff from Weekend CSW. CSW aware patient's family is now requesting patient go to [facility]. CSW attempted to follow up with Irine Seal, Admissions at [facility] regarding bed availability and to get auth started. CSW left voicemail for return call.  8:47am- CSW spoke with Admissions at [facility] who stated they were reviewing their available beds and would get back to CSW.   9:12am- CSW received return call from [facility] reporting they had an available bed and would go ahead and start authorization. CSW has updated patient's daughter.   Ollen Barges, [place] Work Department  [employer]  [phone]

## 2018-09-08 MED ORDER — HYDROCODONE-ACETAMINOPHEN 5-325 MG PO TABS: 0.5000 | ORAL_TABLET | ORAL | 0 refills | Status: DC | PRN

## 2018-09-08 NOTE — ED Notes (Signed)
Report called to [facility]

## 2018-09-08 NOTE — ED Notes (Signed)
Breakfast Tray Ordered. 

## 2018-09-08 NOTE — Progress Notes (Addendum)
Update: Patient has received authorization for [facility]. CSW has notified MD and RN. CSW notified patients daughter,Mary, of discharge. Please call report to [phone].   CSW aware patient is awaiting SNF placement at [facility]. CSW waiting on insurance authorization at this time but will continue to update.   Kingsley Spittle, [place]  [phone]

## 2018-09-08 NOTE — Progress Notes (Signed)
Physical Therapy Treatment Patient Details Name: [name] MRN: [id number] DOB: 12/02/1925 Today's Date: 09/08/2018    History of Present Illness Patient is 85 y/o female returning to ED due to inability to care for self at home. Patient in ED on 09/02/18 following a fall at home resulting in a R humeral fx. PMH includes recurrent falls, CKD, HTN, COPD, osteoporosis, orthostatic hypotension, and hx of cancer.     PT Comments    Patient is progressing towards goals. Patient completed side stepping at EOB with modA +2 to maintain balance. Educated about seated HEP for bilateral LE strengthening. Current recommendation remains appropriate. Patient will benefit from acute physical therapy to maximize independence and safety with functional mobility.   Follow Up Recommendations  SNF;Supervision/Assistance - 24 hour     Equipment Recommendations  Other (comment)(TBD at next venue)    Recommendations for Other Services       Precautions / Restrictions Precautions Precautions: Fall Precaution Comments: Reports 3 falls in last month Required Braces or Orthoses: Sling;Splint/Cast Restrictions Weight Bearing Restrictions: Yes RUE Weight Bearing: Non weight bearing    Mobility  Bed Mobility Overal bed mobility: Needs Assistance Bed Mobility: Supine to Sit;Sit to Supine     Supine to sit: Supervision Sit to supine: Supervision   General bed mobility comments: Patient required supervision for all bed mobility for safety  Transfers Overall transfer level: Needs assistance Equipment used: 1 person hand held assist Transfers: Sit to/from Stand Sit to Stand: Min assist;+2 physical assistance         General transfer comment: Patient required minA +2 for lift assist to stand x2. Patient bracing LEs on bed to help maintain standing balance. Verbal cues to widen BOS upon standing.   Ambulation/Gait Ambulation/Gait assistance: Mod assist;+2 physical assistance   Assistive  device: 1 person hand held assist       General Gait Details: Patient completed side steps at EOB with modA +2 for steadying. Patient reported feeling dizziness initally when side stepping but subsided with seated rest break. Patient with posterior lean in standing during side stepping.    Stairs             Wheelchair Mobility    Modified Rankin (Stroke Patients Only)       Balance Overall balance assessment: Needs assistance Sitting-balance support: Feet unsupported;No upper extremity supported Sitting balance-Leahy Scale: Good     Standing balance support: Single extremity supported Standing balance-Leahy Scale: Poor Standing balance comment: external support required to maintain balance                            Cognition Arousal/Alertness: Awake/alert Behavior During Therapy: WFL for tasks assessed/performed Overall Cognitive Status: Impaired/Different from baseline Area of Impairment: Memory                     Memory: Decreased recall of precautions;Decreased short-term memory                Exercises General Exercises - Lower Extremity Long Arc Quad: AROM;Both;10 reps;Seated Hip Flexion/Marching: AROM;Both;10 reps;Seated    General Comments        Pertinent Vitals/Pain Pain Assessment: Faces Faces Pain Scale: Hurts a little bit Pain Location: R shoulder Pain Descriptors / Indicators: Discomfort;Guarding;Grimacing Pain Intervention(s): Limited activity within patient's tolerance;Monitored during session;Repositioned    Home Living  Prior Function            PT Goals (current goals can now be found in the care plan section) Acute Rehab PT Goals Patient Stated Goal: "to get out of here" PT Goal Formulation: With patient Time For Goal Achievement: 09/18/18 Potential to Achieve Goals: Fair Progress towards PT goals: Progressing toward goals    Frequency    Min 2X/week      PT  Plan Current plan remains appropriate    Co-evaluation              AM-PAC PT "6 Clicks" Mobility   Outcome Measure  Help needed turning from your back to your side while in a flat bed without using bedrails?: A Little Help needed moving from lying on your back to sitting on the side of a flat bed without using bedrails?: A Little Help needed moving to and from a bed to a chair (including a wheelchair)?: A Lot Help needed standing up from a chair using your arms (e.g., wheelchair or bedside chair)?: A Lot Help needed to walk in hospital room?: A Lot Help needed climbing 3-5 steps with a railing? : Total 6 Click Score: 13    End of Session Equipment Utilized During Treatment: Gait belt Activity Tolerance: Patient tolerated treatment well Patient left: in bed;with call bell/phone within reach Nurse Communication: Mobility status PT Visit Diagnosis: Unsteadiness on feet (R26.81);Muscle weakness (generalized) (M62.81);Difficulty in walking, not elsewhere classified (R26.2);History of falling (Z91.81)     Time: 9702-6378 PT Time Calculation (min) (ACUTE ONLY): 15 min  Charges:  $Therapeutic Activity: 8-22 mins                     Erick Blinks, SPT  Erick Blinks 09/08/2018, 1:31 PM

## 2018-09-08 NOTE — ED Provider Notes (Signed)
Evaluated by social work and cleared for discharge to nursing facility.   Julianne Rice, MD 09/08/18 [phone]

## 2018-09-08 NOTE — ED Notes (Signed)
No E-signature available; pt is stable for discharge and states understanding of discharge instructions  

## 2018-11-24 ENCOUNTER — Other Ambulatory Visit: Payer: Self-pay

## 2018-11-25 ENCOUNTER — Encounter: Payer: Self-pay | Admitting: *Deleted

## 2018-11-26 NOTE — Progress Notes (Addendum)
Virtual Visit via Video Note The purpose of this virtual visit is to provide medical care while limiting exposure to the novel coronavirus.    Consent was obtained for video visit:  Yes.   Answered questions that patient had about telehealth interaction:  Yes.   I discussed the limitations, risks, security and privacy concerns of performing an evaluation and management service by telemedicine. I also discussed with the patient that there may be a patient responsible charge related to this service. The patient expressed understanding and agreed to proceed.  Pt location: Home Physician Location: home Name of referring provider:  Gaynelle Arabian, MD I connected with [name] at patients initiation/request on 11/27/2018 at  1:00 PM EDT by video enabled telemedicine application and verified that I am speaking with the correct person using two identifiers. Pt MRN:  [id number] Pt DOB:  04-24-1926 Video Participants:  [name];  Daughter supplements hx   History of Present Illness:  Patient is seen today in follow-up, but it is a new problem.  I have previously only seen her for tremor.  When I saw her last, we took her off of primidone because of the interaction with Eliquis.  She is referred today to discuss memory change.  Patient presents today with daughter who supplements the history.  Memory change has been noticed by family.  They noticed it mostly when in SNF recently.  She would call her daughter and tell her she was looking for [facility].  Her daughter thought that it was due to medications that she was on for sleep and pain.  She has been home for 6 weeks and daughter notices she is mostly back to baseline.  She will forget names.   Living situation:  Pt lives with "my estranged husband."  "he lives in one end of the house and I live in another end."  The patient does not do the finances in the home.  "I just turned over the finances to him - I couldn't do it."  Pts daughter  thinks that he took it over because the patient could not longer write after broke her arm, but admits that arm is better now.  The patient does not drive - "they lifted my license."  States that they did that in last 6 weeks and Gaynelle Arabian, MD took away license.   The patient does cook some but they have a caregiver to help with her breakfast.  Caregiver stays from [place].  She helps with breakfast/laundry/dressing.    ADL's:  The patient is able to perform her own ADL's now that her arm is better. The patient self medicates but her caregiver reminds her in the morning to take medication.  She takes care of her own meds at the end of the day.  The patients bladder and bowel are under good control.   Behavior:   There have been no behavioral changes over the years per pt but daughter reports more anxiety and relates it to increased people in the home with home health personnel in the home and people calling to check on her.    Mobility: Medical records are reviewed.  Patient had a fall for which she presented to the emergency room on February 12.  Medical records indicate that follows the result of a spell of dizziness.  She subsequently broke her humerus.  She was released home.  2 days later she presented back to the emergency room because of inability to care for  herself because of the arm splint.  She presented for subacute nursing placement.  Patient had a CT of the brain that I did personally review.  There was significant small vessel disease, but it was otherwise unremarkable.    Observations/Objective:   Vitals:   11/27/18 1240  Weight: 102 lb (46.3 kg)  Height: 5\' 4"  (1.626 m)   Current Outpatient Medications on File Prior to Visit  Medication Sig Dispense Refill  . acetaminophen (TYLENOL) 500 MG tablet Take 1-2 tablets (500-1,000 mg total) by mouth every 6 (six) hours as needed for moderate pain. 30 tablet 0  . albuterol (PROVENTIL HFA;VENTOLIN HFA) 108 (90 Base) MCG/ACT  inhaler Inhale 2 puffs into the lungs every 6 (six) hours as needed for wheezing or shortness of breath. 1 Inhaler 2  . amiodarone (PACERONE) 200 MG tablet Take 1 tablet (200 mg total) by mouth daily. 30 tablet 2  . apixaban (ELIQUIS) 2.5 MG TABS tablet Take 1 tablet (2.5 mg total) by mouth 2 (two) times daily. 60 tablet 2  . atenolol (TENORMIN) 50 MG tablet Take 50 mg by mouth daily.    . Calcium Carbonate-Vitamin D (CALCIUM-D) 600-400 MG-UNIT TABS Take 1 tablet by mouth as needed (if needed).     . chlordiazePOXIDE (LIBRIUM) 10 MG capsule Take 10 mg by mouth 2 (two) times daily as needed for anxiety.     . clindamycin (CLEOCIN) 150 MG capsule Take 4 capsules by mouth as needed. As needed for dental procedures  0  . dextromethorphan-guaiFENesin (MUCINEX DM) 30-600 MG per 12 hr tablet Take 1 tablet by mouth daily as needed (sinus).     . famotidine (PEPCID) 20 MG tablet TK 1 T PO BID FOR STOMACH ACID    . fluticasone (FLONASE) 50 MCG/ACT nasal spray Place 2 sprays into the nose daily as needed for allergies.     . Glycerin-Hypromellose-PEG 400 ([facility] DRY EYE RELIEF) 0.2-0.2-1 % SOLN Place 1 drop into both eyes as needed (dry eye).    . hydrochlorothiazide (MICROZIDE) 12.5 MG capsule Take 1 capsule (12.5 mg total) by mouth daily. 30 capsule 2  . HYDROcodone-acetaminophen (NORCO/VICODIN) 5-325 MG tablet Take 0.5 tablets by mouth every 4 (four) hours as needed for moderate pain. 10 tablet 0  . hyoscyamine (LEVSIN SL) 0.125 MG SL tablet Place 0.125 mg under the tongue every 4 (four) hours as needed for cramping. Reported on 09/25/2015    . loperamide (IMODIUM A-D) 2 MG tablet Take 2 mg by mouth 4 (four) times daily as needed for diarrhea or loose stools.    Marland Kitchen losartan (COZAAR) 50 MG tablet Take 1 tablet (50 mg total) by mouth daily. (Patient taking differently: Take 25-50 mg by mouth as needed (if she does not feel well). ) 30 tablet 2  . meclizine (ANTIVERT) 50 MG tablet Take 0.5 tablets (25 mg total) by  mouth 3 (three) times daily as needed. 30 tablet 0  . Multiple Vitamin (MULTIVITAMIN) capsule Take 1 capsule by mouth daily.    Marland Kitchen oxyCODONE (ROXICODONE) 5 MG immediate release tablet Take 0.5 tablets (2.5 mg total) by mouth every 6 (six) hours as needed for severe pain or breakthrough pain. 15 tablet 0  . propranolol ER (INDERAL LA) 120 MG 24 hr capsule Take 120 mg by mouth 2 (two) times daily.    . ranitidine (ZANTAC) 300 MG tablet Take 150 mg by mouth 2 (two) times daily.     . sertraline (ZOLOFT) 50 MG tablet Take 50 mg by mouth  daily.    . traZODone (DESYREL) 50 MG tablet TK ONE-HALF TO 1 T PO BEFORE BED PRF SLEEP PROBLEMS     No current facility-administered medications on file prior to visit.      GEN:  The patient appears stated age and is in NAD.  Neurological examination:  Orientation:  Montreal Cognitive Assessment  11/25/2018  Visuospatial/ Executive (0/5) 1  Naming (0/3) 3  Attention: Read list of digits (0/2) 2  Attention: Read list of letters (0/1) 1  Attention: Serial 7 subtraction starting at 100 (0/3) 2  Language: Repeat phrase (0/2) 0  Language : Fluency (0/1) 1  Abstraction (0/2) 1  Delayed Recall (0/5) 3  Orientation (0/6) 4  Total 18    Cranial nerves: There is good facial symmetry. There is no facial hypomimia.  The speech is fluent and clear. Soft palate rises symmetrically and there is no tongue deviation. Hearing is intact to conversational tone. Motor: Strength is at least antigravity x 4.   Shoulder shrug is equal and symmetric.  There is no pronator drift.  Movement examination: Tone: unable Abnormal movements: She has moderate postural and intention tremor. Coordination:  There is no decremation with RAM's Gait and Station: The patient pushes off of the couch.  She walks with the walker and walks well with that.  Addendum labs  Received communication from primary care.  Last labs were the patient's hospitalization labs from February.  They are as  follows:   Chemistry      Component Value Date/Time   NA 140 09/02/2018 2110   NA 140 05/13/2017 0914   K 3.5 09/02/2018 2110   K 3.8 05/13/2017 0914   CL 103 09/02/2018 2110   CL 103 10/09/2012 1036   CO2 26 09/02/2018 2110   CO2 29 05/13/2017 0914   BUN 29 (H) 09/02/2018 2110   BUN 24.9 05/13/2017 0914   CREATININE 1.34 (H) 09/02/2018 2110   CREATININE 1.4 (H) 05/13/2017 0914      Component Value Date/Time   CALCIUM 9.1 09/02/2018 2110   CALCIUM 9.4 05/13/2017 0914   ALKPHOS 57 09/02/2018 2110   ALKPHOS 59 05/13/2017 0914   AST 19 09/02/2018 2110   AST 45 (H) 05/13/2017 0914   ALT 12 09/02/2018 2110   ALT 7 05/13/2017 0914   BILITOT 0.9 09/02/2018 2110   BILITOT 0.37 05/13/2017 0914     Lab Results  Component Value Date   VITAMINB12 725 05/19/2018   Lab Results  Component Value Date   TSH 1.299 01/30/2017     Assessment and Plan:   1.  Memory loss  -suspect Alzheimer dementia  -refused neurocognitive testing  -will get labs from pcp  -will start aricept 5 mg x 1 month and increase to 10 mg daily.  Risks, benefits, side effects and alternative therapies were discussed (discussed bradycardia).  The opportunity to ask questions was given and they were answered to the best of my ability.  The patient expressed understanding and willingness to follow the outlined treatment protocols.  -agree with no driving  -has morning caregivers but may need more caregiving in future.  Husband lives with her but they are "estranged" and just share house for expense purposes  2.  Essential tremor  -Was worsened when she was placed on amiodarone.  -Primidone helps, but had to be discontinued when placed on Eliquis  -On propranolol  Follow Up Instructions:  f/u 6 months, sooner should new neurologic issues arise  -I discussed the assessment  and treatment plan with the patient. The patient was provided an opportunity to ask questions and all were answered. The patient agreed with  the plan and demonstrated an understanding of the instructions.   The patient was advised to call back or seek an in-person evaluation if the symptoms worsen or if the condition fails to improve as anticipated.    Total Time spent in visit with the patient was:  40 min (didn't include MOCA time), of which more than 50% of the time was spent in counseling and/or coordinating care on safety.   Pt understands and agrees with the plan of care outlined.   time referenced above did not include the 35 min of record review which was detailed above, which was non face to face time.    Alonza Bogus, DO

## 2018-11-27 ENCOUNTER — Encounter: Payer: Self-pay | Admitting: Neurology

## 2018-11-27 ENCOUNTER — Telehealth (INDEPENDENT_AMBULATORY_CARE_PROVIDER_SITE_OTHER): Payer: Medicare Other | Admitting: Neurology

## 2018-11-27 ENCOUNTER — Other Ambulatory Visit: Payer: Self-pay

## 2018-11-27 ENCOUNTER — Telehealth: Payer: Self-pay | Admitting: Neurology

## 2018-11-27 DIAGNOSIS — W19XXXA Unspecified fall, initial encounter: Secondary | ICD-10-CM

## 2018-11-27 DIAGNOSIS — W19XXXD Unspecified fall, subsequent encounter: Secondary | ICD-10-CM

## 2018-11-27 DIAGNOSIS — F0280 Dementia in other diseases classified elsewhere without behavioral disturbance: Secondary | ICD-10-CM | POA: Diagnosis not present

## 2018-11-27 DIAGNOSIS — G250 Essential tremor: Secondary | ICD-10-CM

## 2018-11-27 DIAGNOSIS — S42401D Unspecified fracture of lower end of right humerus, subsequent encounter for fracture with routine healing: Secondary | ICD-10-CM

## 2018-11-27 DIAGNOSIS — G301 Alzheimer's disease with late onset: Secondary | ICD-10-CM | POA: Diagnosis not present

## 2018-11-27 MED ORDER — DONEPEZIL HCL 5 MG PO TABS: 5.0000 mg | ORAL_TABLET | Freq: Every day | ORAL | 0 refills | Status: DC

## 2018-11-27 MED ORDER — DONEPEZIL HCL 10 MG PO TABS: 10.0000 mg | ORAL_TABLET | Freq: Every day | ORAL | 1 refills | Status: DC

## 2018-11-27 NOTE — Telephone Encounter (Signed)
Notified pharmacy Rx 5mg  and 10 mg needed to filled.

## 2018-11-27 NOTE — Telephone Encounter (Signed)
[facility] left VM about donepezil 5mg  and 10mg . She didn't know if she needed to fill the prescription for both milligrams.  Please call back at [phone]. Thanks!

## 2018-12-04 ENCOUNTER — Encounter (HOSPITAL_BASED_OUTPATIENT_CLINIC_OR_DEPARTMENT_OTHER): Payer: Medicare Other | Attending: Internal Medicine

## 2018-12-04 ENCOUNTER — Other Ambulatory Visit: Payer: Self-pay

## 2018-12-04 DIAGNOSIS — Z86718 Personal history of other venous thrombosis and embolism: Secondary | ICD-10-CM | POA: Diagnosis not present

## 2018-12-04 DIAGNOSIS — L97212 Non-pressure chronic ulcer of right calf with fat layer exposed: Secondary | ICD-10-CM | POA: Diagnosis not present

## 2018-12-04 DIAGNOSIS — L97812 Non-pressure chronic ulcer of other part of right lower leg with fat layer exposed: Secondary | ICD-10-CM | POA: Insufficient documentation

## 2018-12-04 DIAGNOSIS — J449 Chronic obstructive pulmonary disease, unspecified: Secondary | ICD-10-CM | POA: Diagnosis not present

## 2018-12-04 DIAGNOSIS — Z8572 Personal history of non-Hodgkin lymphomas: Secondary | ICD-10-CM | POA: Diagnosis not present

## 2018-12-04 DIAGNOSIS — L97822 Non-pressure chronic ulcer of other part of left lower leg with fat layer exposed: Secondary | ICD-10-CM | POA: Insufficient documentation

## 2018-12-04 DIAGNOSIS — Z9221 Personal history of antineoplastic chemotherapy: Secondary | ICD-10-CM | POA: Insufficient documentation

## 2018-12-04 DIAGNOSIS — N189 Chronic kidney disease, unspecified: Secondary | ICD-10-CM | POA: Diagnosis not present

## 2018-12-04 DIAGNOSIS — I129 Hypertensive chronic kidney disease with stage 1 through stage 4 chronic kidney disease, or unspecified chronic kidney disease: Secondary | ICD-10-CM | POA: Insufficient documentation

## 2018-12-04 DIAGNOSIS — I120 Hypertensive chronic kidney disease with stage 5 chronic kidney disease or end stage renal disease: Secondary | ICD-10-CM | POA: Insufficient documentation

## 2018-12-04 DIAGNOSIS — G309 Alzheimer's disease, unspecified: Secondary | ICD-10-CM | POA: Insufficient documentation

## 2018-12-04 DIAGNOSIS — I872 Venous insufficiency (chronic) (peripheral): Secondary | ICD-10-CM | POA: Insufficient documentation

## 2018-12-04 DIAGNOSIS — N186 End stage renal disease: Secondary | ICD-10-CM | POA: Insufficient documentation

## 2018-12-04 DIAGNOSIS — I87333 Chronic venous hypertension (idiopathic) with ulcer and inflammation of bilateral lower extremity: Secondary | ICD-10-CM | POA: Diagnosis not present

## 2018-12-04 DIAGNOSIS — Z7901 Long term (current) use of anticoagulants: Secondary | ICD-10-CM | POA: Diagnosis not present

## 2018-12-04 DIAGNOSIS — F0280 Dementia in other diseases classified elsewhere without behavioral disturbance: Secondary | ICD-10-CM | POA: Diagnosis not present

## 2018-12-04 DIAGNOSIS — I4891 Unspecified atrial fibrillation: Secondary | ICD-10-CM | POA: Diagnosis not present

## 2018-12-04 DIAGNOSIS — I890 Lymphedema, not elsewhere classified: Secondary | ICD-10-CM | POA: Insufficient documentation

## 2018-12-11 DIAGNOSIS — I87333 Chronic venous hypertension (idiopathic) with ulcer and inflammation of bilateral lower extremity: Secondary | ICD-10-CM | POA: Diagnosis not present

## 2018-12-18 DIAGNOSIS — I87333 Chronic venous hypertension (idiopathic) with ulcer and inflammation of bilateral lower extremity: Secondary | ICD-10-CM | POA: Diagnosis not present

## 2018-12-25 ENCOUNTER — Encounter (HOSPITAL_BASED_OUTPATIENT_CLINIC_OR_DEPARTMENT_OTHER): Payer: Medicare Other | Attending: Internal Medicine

## 2018-12-25 DIAGNOSIS — Z86718 Personal history of other venous thrombosis and embolism: Secondary | ICD-10-CM | POA: Insufficient documentation

## 2018-12-25 DIAGNOSIS — I87333 Chronic venous hypertension (idiopathic) with ulcer and inflammation of bilateral lower extremity: Secondary | ICD-10-CM | POA: Diagnosis present

## 2018-12-25 DIAGNOSIS — Z9221 Personal history of antineoplastic chemotherapy: Secondary | ICD-10-CM | POA: Insufficient documentation

## 2018-12-25 DIAGNOSIS — L97822 Non-pressure chronic ulcer of other part of left lower leg with fat layer exposed: Secondary | ICD-10-CM | POA: Insufficient documentation

## 2018-12-25 DIAGNOSIS — I120 Hypertensive chronic kidney disease with stage 5 chronic kidney disease or end stage renal disease: Secondary | ICD-10-CM | POA: Diagnosis not present

## 2018-12-25 DIAGNOSIS — L97812 Non-pressure chronic ulcer of other part of right lower leg with fat layer exposed: Secondary | ICD-10-CM | POA: Insufficient documentation

## 2018-12-25 DIAGNOSIS — N186 End stage renal disease: Secondary | ICD-10-CM | POA: Insufficient documentation

## 2018-12-25 DIAGNOSIS — M069 Rheumatoid arthritis, unspecified: Secondary | ICD-10-CM | POA: Diagnosis not present

## 2019-01-01 DIAGNOSIS — I87333 Chronic venous hypertension (idiopathic) with ulcer and inflammation of bilateral lower extremity: Secondary | ICD-10-CM | POA: Diagnosis not present

## 2019-01-08 DIAGNOSIS — I87333 Chronic venous hypertension (idiopathic) with ulcer and inflammation of bilateral lower extremity: Secondary | ICD-10-CM | POA: Diagnosis not present

## 2019-01-14 DIAGNOSIS — I87333 Chronic venous hypertension (idiopathic) with ulcer and inflammation of bilateral lower extremity: Secondary | ICD-10-CM | POA: Diagnosis not present

## 2019-01-25 ENCOUNTER — Telehealth: Payer: Self-pay | Admitting: Neurology

## 2019-01-25 DIAGNOSIS — R4182 Altered mental status, unspecified: Secondary | ICD-10-CM

## 2019-01-25 DIAGNOSIS — G9340 Encephalopathy, unspecified: Secondary | ICD-10-CM

## 2019-01-25 NOTE — Telephone Encounter (Signed)
Her daughter was on the telemed visit with her when we discussed starting the aricept.  It should be at the pharmacy.  Would recommend starting it.  Did PCP do other labs?  If so, please get a copy.  If not, we will do labs.  Also, make sure no falls where she could have hit head.  Is she still on pain meds?

## 2019-01-25 NOTE — Telephone Encounter (Signed)
I thought that she just had a UA done last week (or was that the only lab)?  Order cbc, chemistry, tsh (dx: mental status change, encephalopathy).  Get CT brain without contrast (Dx:  Mental status change)

## 2019-01-25 NOTE — Telephone Encounter (Addendum)
Pt called with c/o:  confusion/hallucinations New medications?  No. When did they start?  none If hallucinations are new, has patient been checked for infection, including UTI?  Yes.  results negative culture also negative last week Current medications prescribed by Dr. Carles Collet and TIMES taking the medications: not on any medication right from Dr. Carles Collet    Pt daughter states that she is going down hill very fast within a week, her hallucinations are getting bad  She see people that are not there, shadows of people, polar bears that are bleeding. Not sleep at all she has to go to her house in the middle of the night to give her something to help her sleep.  Legs are swelling.  Ask her if patient started the ARICEPT 5 mg or 10 mg as requested during last visit on 11/29/18. She said no. It was never pick up from pharmacy. Pharmacy contact her mother is so she wasn't aware that it was there.   Please advise

## 2019-01-25 NOTE — Telephone Encounter (Addendum)
Order for labs and CT place will notify patient daughter.   Stanton Kidney was made aware of lab protocol and check in at our office then down stair to [address]. Also notified on lab lunch hours.

## 2019-01-25 NOTE — Addendum Note (Signed)
Addended by: Ranae Plumber on: 01/25/2019 02:04 PM   Modules accepted: Orders

## 2019-01-25 NOTE — Telephone Encounter (Signed)
No labs done at PCP office recently  3 falls last week denies head injuries Not on pain medications that she can confirm at this time. She is aware aricept is at pharmacy will pick it up.

## 2019-01-25 NOTE — Telephone Encounter (Signed)
Daughter was calling in needing to speak with you about mother's worsening condtiiton. She is getting a lot worse. She said she thinks she has sun downers. She is needing to know what to do if anything to help. Please call her back. Thanks!

## 2019-01-27 ENCOUNTER — Other Ambulatory Visit: Payer: Self-pay

## 2019-01-27 ENCOUNTER — Other Ambulatory Visit (INDEPENDENT_AMBULATORY_CARE_PROVIDER_SITE_OTHER): Payer: Medicare Other

## 2019-01-27 ENCOUNTER — Other Ambulatory Visit: Payer: Medicare Other

## 2019-01-27 DIAGNOSIS — G9340 Encephalopathy, unspecified: Secondary | ICD-10-CM

## 2019-01-27 DIAGNOSIS — R4182 Altered mental status, unspecified: Secondary | ICD-10-CM

## 2019-01-27 LAB — COMPREHENSIVE METABOLIC PANEL WITH GFR
ALT: 10 U/L (ref 0–35)
AST: 14 U/L (ref 0–37)
Albumin: 3.1 g/dL — ABNORMAL LOW (ref 3.5–5.2)
Alkaline Phosphatase: 87 U/L (ref 39–117)
BUN: 31 mg/dL — ABNORMAL HIGH (ref 6–23)
CO2: 27 meq/L (ref 19–32)
Calcium: 8.8 mg/dL (ref 8.4–10.5)
Chloride: 105 meq/L (ref 96–112)
Creatinine, Ser: 1.15 mg/dL (ref 0.40–1.20)
GFR: 44.27 mL/min — ABNORMAL LOW
Glucose, Bld: 104 mg/dL — ABNORMAL HIGH (ref 70–99)
Potassium: 3.4 meq/L — ABNORMAL LOW (ref 3.5–5.1)
Sodium: 140 meq/L (ref 135–145)
Total Bilirubin: 0.5 mg/dL (ref 0.2–1.2)
Total Protein: 6.7 g/dL (ref 6.0–8.3)

## 2019-01-27 LAB — CBC
HCT: 28.3 % — ABNORMAL LOW (ref 36.0–46.0)
Hemoglobin: 9.3 g/dL — ABNORMAL LOW (ref 12.0–15.0)
MCHC: 33 g/dL (ref 30.0–36.0)
MCV: 94.4 fl (ref 78.0–100.0)
Platelets: 134 10*3/uL — ABNORMAL LOW (ref 150.0–400.0)
RBC: 3 Mil/uL — ABNORMAL LOW (ref 3.87–5.11)
RDW: 15.2 % (ref 11.5–15.5)
WBC: 3.5 10*3/uL — ABNORMAL LOW (ref 4.0–10.5)

## 2019-01-27 LAB — TSH: TSH: 0.86 u[IU]/mL (ref 0.35–4.50)

## 2019-01-28 ENCOUNTER — Telehealth: Payer: Self-pay

## 2019-01-28 NOTE — Telephone Encounter (Signed)
-----   Message from [place], DO sent at 01/28/2019  7:42 AM EDT ----- pts labs are fairly stable, although her anemia is worse than prior and they should f/u with PCP about that.  Fax results to PCP and let pt/daughter know results

## 2019-01-28 NOTE — Telephone Encounter (Signed)
Called spoke with patient daughter Bonner Puna. She was informed on patient lab results. Copy sent to PCP office.  CT scheduled for 02/04/19

## 2019-01-29 ENCOUNTER — Other Ambulatory Visit: Payer: Self-pay

## 2019-01-29 ENCOUNTER — Encounter (HOSPITAL_BASED_OUTPATIENT_CLINIC_OR_DEPARTMENT_OTHER): Payer: Medicare Other | Attending: Internal Medicine

## 2019-01-29 DIAGNOSIS — I120 Hypertensive chronic kidney disease with stage 5 chronic kidney disease or end stage renal disease: Secondary | ICD-10-CM | POA: Insufficient documentation

## 2019-01-29 DIAGNOSIS — Z9221 Personal history of antineoplastic chemotherapy: Secondary | ICD-10-CM | POA: Diagnosis not present

## 2019-01-29 DIAGNOSIS — I87331 Chronic venous hypertension (idiopathic) with ulcer and inflammation of right lower extremity: Secondary | ICD-10-CM | POA: Diagnosis present

## 2019-01-29 DIAGNOSIS — M069 Rheumatoid arthritis, unspecified: Secondary | ICD-10-CM | POA: Diagnosis not present

## 2019-01-29 DIAGNOSIS — Z86718 Personal history of other venous thrombosis and embolism: Secondary | ICD-10-CM | POA: Insufficient documentation

## 2019-01-29 DIAGNOSIS — N186 End stage renal disease: Secondary | ICD-10-CM | POA: Diagnosis not present

## 2019-01-29 DIAGNOSIS — L97812 Non-pressure chronic ulcer of other part of right lower leg with fat layer exposed: Secondary | ICD-10-CM | POA: Diagnosis not present

## 2019-02-04 ENCOUNTER — Ambulatory Visit
Admission: RE | Admit: 2019-02-04 | Discharge: 2019-02-04 | Disposition: A | Discharge status: Home / Self Care | Payer: Medicare Other | Source: Ambulatory Visit | Attending: Neurology | Admitting: Neurology

## 2019-02-04 DIAGNOSIS — R4182 Altered mental status, unspecified: Secondary | ICD-10-CM

## 2019-02-05 ENCOUNTER — Telehealth: Payer: Self-pay

## 2019-02-05 NOTE — Telephone Encounter (Signed)
Called patient [name] was informed on CT results.  Also called patient daughter Stanton Kidney no answer left message to call office for results.

## 2019-02-05 NOTE — Telephone Encounter (Signed)
Spoke with [name] patient daughter she was informed of the results and understands. She states that she will talk to her mom about having the neurocognitive test that was mention at last telemedicine visit. She will contact the office once they have spoken and patient agrees to take the test.

## 2019-02-05 NOTE — Telephone Encounter (Signed)
-----   Message from [place], DO sent at 02/05/2019  9:11 AM EDT ----- Let pt/daughter know that CT is non acute (nothing new)

## 2019-02-12 DIAGNOSIS — I87331 Chronic venous hypertension (idiopathic) with ulcer and inflammation of right lower extremity: Secondary | ICD-10-CM | POA: Diagnosis not present

## 2019-02-18 ENCOUNTER — Inpatient Hospital Stay: Payer: Medicare Other | Attending: Hematology and Oncology

## 2019-02-18 ENCOUNTER — Other Ambulatory Visit: Payer: Self-pay

## 2019-02-18 ENCOUNTER — Encounter: Payer: Self-pay | Admitting: Hematology and Oncology

## 2019-02-18 ENCOUNTER — Inpatient Hospital Stay (HOSPITAL_BASED_OUTPATIENT_CLINIC_OR_DEPARTMENT_OTHER): Payer: Medicare Other | Admitting: Hematology and Oncology

## 2019-02-18 DIAGNOSIS — N183 Chronic kidney disease, stage 3 unspecified: Secondary | ICD-10-CM

## 2019-02-18 DIAGNOSIS — Z8572 Personal history of non-Hodgkin lymphomas: Secondary | ICD-10-CM | POA: Insufficient documentation

## 2019-02-18 DIAGNOSIS — I87331 Chronic venous hypertension (idiopathic) with ulcer and inflammation of right lower extremity: Secondary | ICD-10-CM | POA: Diagnosis not present

## 2019-02-18 DIAGNOSIS — D472 Monoclonal gammopathy: Secondary | ICD-10-CM

## 2019-02-18 DIAGNOSIS — R413 Other amnesia: Secondary | ICD-10-CM

## 2019-02-18 LAB — COMPREHENSIVE METABOLIC PANEL
ALT: 13 U/L (ref 0–44)
AST: 20 U/L (ref 15–41)
Albumin: 3.2 g/dL — ABNORMAL LOW (ref 3.5–5.0)
Alkaline Phosphatase: 91 U/L (ref 38–126)
Anion gap: 9 (ref 5–15)
BUN: 36 mg/dL — ABNORMAL HIGH (ref 8–23)
CO2: 28 mmol/L (ref 22–32)
Calcium: 9.5 mg/dL (ref 8.9–10.3)
Chloride: 103 mmol/L (ref 98–111)
Creatinine, Ser: 1.67 mg/dL — ABNORMAL HIGH (ref 0.44–1.00)
GFR calc Af Amer: 31 mL/min — ABNORMAL LOW (ref >60)
GFR calc non Af Amer: 27 mL/min — ABNORMAL LOW (ref >60)
Glucose, Bld: 125 mg/dL — ABNORMAL HIGH (ref 70–99)
Potassium: 3.2 mmol/L — ABNORMAL LOW (ref 3.5–5.1)
Sodium: 140 mmol/L (ref 135–145)
Total Bilirubin: 0.5 mg/dL (ref 0.3–1.2)
Total Protein: 7.1 g/dL (ref 6.5–8.1)

## 2019-02-18 LAB — CBC WITH DIFFERENTIAL/PLATELET
Abs Immature Granulocytes: 0.01 10*3/uL (ref 0.00–0.07)
Basophils Absolute: 0 10*3/uL (ref 0.0–0.1)
Basophils Relative: 0 %
Eosinophils Absolute: 0 10*3/uL (ref 0.0–0.5)
Eosinophils Relative: 1 %
HCT: 35.1 % — ABNORMAL LOW (ref 36.0–46.0)
Hemoglobin: 11.1 g/dL — ABNORMAL LOW (ref 12.0–15.0)
Immature Granulocytes: 0 %
Lymphocytes Relative: 30 %
Lymphs Abs: 1.5 10*3/uL (ref 0.7–4.0)
MCH: 29.8 pg (ref 26.0–34.0)
MCHC: 31.6 g/dL (ref 30.0–36.0)
MCV: 94.1 fL (ref 80.0–100.0)
Monocytes Absolute: 0.3 10*3/uL (ref 0.1–1.0)
Monocytes Relative: 6 %
Neutro Abs: 3 10*3/uL (ref 1.7–7.7)
Neutrophils Relative %: 63 %
Platelets: 157 10*3/uL (ref 150–400)
RBC: 3.73 MIL/uL — ABNORMAL LOW (ref 3.87–5.11)
RDW: 14.5 % (ref 11.5–15.5)
WBC: 4.8 10*3/uL (ref 4.0–10.5)
nRBC: 0 % (ref 0.0–0.2)

## 2019-02-18 NOTE — Assessment & Plan Note (Signed)
She has mild acute on chronic renal failure  I encouraged her to increase oral fluid intake as tolerated

## 2019-02-18 NOTE — Assessment & Plan Note (Signed)
She has no signs of cancer recurrence ?

## 2019-02-18 NOTE — Progress Notes (Signed)
[place] OFFICE PROGRESS NOTE  Patient Care Team: Gaynelle Arabian, MD as PCP - General (Family Medicine) Heath Lark, MD as Consulting Physician (Hematology and Oncology) Tat, Eustace Quail, DO as Consulting Physician (Neurology)  ASSESSMENT & PLAN:  MGUS (monoclonal gammopathy of unknown significance) Repeat MGUS is pending I will call her next week with test results She is noted to be very frail It is not clear to me that the patient will be well enough to receive any form of treatment in the future I will have further discussion about plan of care once I have full results with her family and primary care doctor next week  History of B-cell lymphoma She has no signs of cancer recurrence  CKD (chronic kidney disease) stage 3, GFR 30-59 ml/min (HCC) She has mild acute on chronic renal failure  I encouraged her to increase oral fluid intake as tolerated  Memory loss The patient is currently being evaluated by neurologist She appears very frail and had history of recent falls I will defer to neurologist for further management Overall, I do not feel that the patient will be fit enough to receive chemotherapy in the future, if her MGUS is worse or if the patient have recurrence of lymphoma I do not feel that she would benefit from future follow-up Once I have test results next week, I will make personal phone call to family member or primary care physician about future follow-up   No orders of the defined types were placed in this encounter.   INTERVAL HISTORY: Please see below for problem oriented charting. She returns for further follow-up The patient appears to be very frail She looks thin and cachectic, sitting on the wheelchair She had recent recurrent falls She denies pain  SUMMARY OF ONCOLOGIC HISTORY:  [name] was transferred to my care after her prior physician has left.  I reviewed the patient's records extensive and collaborated the history  with the patient. Summary of her history is as follows: This patient was diagnosed with lymphoma in 2013. CT scan of the abdomen and pelvis on 10/02/2011 that noted shotty bilateral inguinal nodes and a hiatal hernia, otherwise negative. The liver, spleen and pancreas were unremarkable. There was no mesenteric or retroperitoneal adenopathy. She was referred to Dr. Neldon Mc who, on 11/18/2011, performed an excisional biopsy of right inguinal node. Surgical pathology revealed atypical follicular proliferation which was positive for BCl-2 and CD20. The overall impression was that this was clearly involvement of follicular lymphoma. (Case number SZ [phone]). She was initiated on Rituxan treatments, 375 mg/m2=600 mg q wk x 4, From 01/30/2012-02/20/12. PET scan on 12/23/11 had shown axillary, subpectoral and inguinal lymphadenopathy. A PET scan on 02/19/12 did show a complete metabolic response to therapy with only 1 focus of very mild hypermetabolic activity in the right axilla. She is now on close surveillance.  She was also found to have IgG MGUS, being monitored only  REVIEW OF SYSTEMS:   Constitutional: Denies fevers, chills or abnormal weight loss Eyes: Denies blurriness of vision Ears, nose, mouth, throat, and face: Denies mucositis or sore throat Respiratory: Denies cough, dyspnea or wheezes Cardiovascular: Denies palpitation, chest discomfort or lower extremity swelling Gastrointestinal:  Denies nausea, heartburn or change in bowel habits Skin: Denies abnormal skin rashes Lymphatics: Denies new lymphadenopathy or easy bruising Neurological:Denies numbness, tingling or new weaknesses Behavioral/Psych: Mood is stable, no new changes  All other systems were reviewed with the patient and are negative.  I have reviewed  the past medical history, past surgical history, social history and family history with the patient and they are unchanged from previous note.  ALLERGIES:  is allergic to  ibuprofen; latex; peanut-containing drug products; strawberry c [ascorbate]; sulindac; amoxicillin; aspirin; benadryl [diphenhydramine hcl]; penicillins; and sulfa antibiotics.  MEDICATIONS:  Current Outpatient Medications  Medication Sig Dispense Refill  . acetaminophen (TYLENOL) 500 MG tablet Take 1-2 tablets (500-1,000 mg total) by mouth every 6 (six) hours as needed for moderate pain. 30 tablet 0  . albuterol (PROVENTIL HFA;VENTOLIN HFA) 108 (90 Base) MCG/ACT inhaler Inhale 2 puffs into the lungs every 6 (six) hours as needed for wheezing or shortness of breath. 1 Inhaler 2  . amiodarone (PACERONE) 200 MG tablet Take 1 tablet (200 mg total) by mouth daily. 30 tablet 2  . apixaban (ELIQUIS) 2.5 MG TABS tablet Take 1 tablet (2.5 mg total) by mouth 2 (two) times daily. 60 tablet 2  . atenolol (TENORMIN) 50 MG tablet Take 50 mg by mouth daily.    . Calcium Carbonate-Vitamin D (CALCIUM-D) 600-400 MG-UNIT TABS Take 1 tablet by mouth as needed (if needed).     . chlordiazePOXIDE (LIBRIUM) 10 MG capsule Take 10 mg by mouth 2 (two) times daily as needed for anxiety.     . clindamycin (CLEOCIN) 150 MG capsule Take 4 capsules by mouth as needed. As needed for dental procedures  0  . dextromethorphan-guaiFENesin (MUCINEX DM) 30-600 MG per 12 hr tablet Take 1 tablet by mouth daily as needed (sinus).     . donepezil (ARICEPT) 10 MG tablet Take 1 tablet (10 mg total) by mouth at bedtime. 90 tablet 1  . donepezil (ARICEPT) 5 MG tablet Take 1 tablet (5 mg total) by mouth at bedtime. 30 tablet 0  . famotidine (PEPCID) 20 MG tablet TK 1 T PO BID FOR STOMACH ACID    . fluticasone (FLONASE) 50 MCG/ACT nasal spray Place 2 sprays into the nose daily as needed for allergies.     . Glycerin-Hypromellose-PEG 400 ([facility] DRY EYE RELIEF) 0.2-0.2-1 % SOLN Place 1 drop into both eyes as needed (dry eye).    . hydrochlorothiazide (MICROZIDE) 12.5 MG capsule Take 1 capsule (12.5 mg total) by mouth daily. 30 capsule 2  .  HYDROcodone-acetaminophen (NORCO/VICODIN) 5-325 MG tablet Take 0.5 tablets by mouth every 4 (four) hours as needed for moderate pain. 10 tablet 0  . hyoscyamine (LEVSIN SL) 0.125 MG SL tablet Place 0.125 mg under the tongue every 4 (four) hours as needed for cramping. Reported on 09/25/2015    . loperamide (IMODIUM A-D) 2 MG tablet Take 2 mg by mouth 4 (four) times daily as needed for diarrhea or loose stools.    Marland Kitchen losartan (COZAAR) 50 MG tablet Take 1 tablet (50 mg total) by mouth daily. (Patient taking differently: Take 25-50 mg by mouth as needed (if she does not feel well). ) 30 tablet 2  . meclizine (ANTIVERT) 50 MG tablet Take 0.5 tablets (25 mg total) by mouth 3 (three) times daily as needed. 30 tablet 0  . Multiple Vitamin (MULTIVITAMIN) capsule Take 1 capsule by mouth daily.    Marland Kitchen oxyCODONE (ROXICODONE) 5 MG immediate release tablet Take 0.5 tablets (2.5 mg total) by mouth every 6 (six) hours as needed for severe pain or breakthrough pain. 15 tablet 0  . propranolol ER (INDERAL LA) 120 MG 24 hr capsule Take 120 mg by mouth 2 (two) times daily.    . ranitidine (ZANTAC) 300 MG tablet Take 150 mg  by mouth 2 (two) times daily.     . sertraline (ZOLOFT) 50 MG tablet Take 50 mg by mouth daily.    . traZODone (DESYREL) 50 MG tablet TK ONE-HALF TO 1 T PO BEFORE BED PRF SLEEP PROBLEMS     No current facility-administered medications for this visit.     PHYSICAL EXAMINATION: ECOG PERFORMANCE STATUS: 2 - Symptomatic, <50% confined to bed  Vitals:   02/18/19 1217  BP: 121/72  Pulse: 88  Resp: 18  Temp: 97.8 F (36.6 C)  SpO2: 97%   There were no vitals filed for this visit.  GENERAL:alert, no distress and comfortable.  She is examined sitting on the wheelchair She looks thin and cachectic and very frail SKIN: skin color, texture, turgor are normal, no rashes or significant lesions.  Noted signs of old bruises EYES: normal, Conjunctiva are pink and non-injected, sclera clear OROPHARYNX:no  exudate, no erythema and lips, buccal mucosa, and tongue normal  NECK: supple, thyroid normal size, non-tender, without nodularity LYMPH:  no palpable lymphadenopathy in the cervical, axillary or inguinal LUNGS: clear to auscultation and percussion with normal breathing effort HEART: regular rate & rhythm and no murmurs and no lower extremity edema ABDOMEN:abdomen soft, non-tender and normal bowel sounds Musculoskeletal:no cyanosis of digits and no clubbing  NEURO: alert & oriented x 3 with fluent speech, no focal motor/sensory deficits  LABORATORY DATA:  I have reviewed the data as listed    Component Value Date/Time   NA 140 02/18/2019 1136   NA 140 05/13/2017 0914   K 3.2 (L) 02/18/2019 1136   K 3.8 05/13/2017 0914   CL 103 02/18/2019 1136   CL 103 10/09/2012 1036   CO2 28 02/18/2019 1136   CO2 29 05/13/2017 0914   GLUCOSE 125 (H) 02/18/2019 1136   GLUCOSE 86 05/13/2017 0914   GLUCOSE 89 10/09/2012 1036   BUN 36 (H) 02/18/2019 1136   BUN 24.9 05/13/2017 0914   CREATININE 1.67 (H) 02/18/2019 1136   CREATININE 1.4 (H) 05/13/2017 0914   CALCIUM 9.5 02/18/2019 1136   CALCIUM 9.4 05/13/2017 0914   PROT 7.1 02/18/2019 1136   PROT 7.3 05/13/2017 0914   PROT 7.7 05/13/2017 0914   ALBUMIN 3.2 (L) 02/18/2019 1136   ALBUMIN 3.6 05/13/2017 0914   AST 20 02/18/2019 1136   AST 45 (H) 05/13/2017 0914   ALT 13 02/18/2019 1136   ALT 7 05/13/2017 0914   ALKPHOS 91 02/18/2019 1136   ALKPHOS 59 05/13/2017 0914   BILITOT 0.5 02/18/2019 1136   BILITOT 0.37 05/13/2017 0914   GFRNONAA 27 (L) 02/18/2019 1136   GFRAA 31 (L) 02/18/2019 1136    No results found for: SPEP, UPEP  Lab Results  Component Value Date   WBC 4.8 02/18/2019   NEUTROABS 3.0 02/18/2019   HGB 11.1 (L) 02/18/2019   HCT 35.1 (L) 02/18/2019   MCV 94.1 02/18/2019   PLT 157 02/18/2019      Chemistry      Component Value Date/Time   NA 140 02/18/2019 1136   NA 140 05/13/2017 0914   K 3.2 (L) 02/18/2019 1136   K  3.8 05/13/2017 0914   CL 103 02/18/2019 1136   CL 103 10/09/2012 1036   CO2 28 02/18/2019 1136   CO2 29 05/13/2017 0914   BUN 36 (H) 02/18/2019 1136   BUN 24.9 05/13/2017 0914   CREATININE 1.67 (H) 02/18/2019 1136   CREATININE 1.4 (H) 05/13/2017 0914      Component Value Date/Time  CALCIUM 9.5 02/18/2019 1136   CALCIUM 9.4 05/13/2017 0914   ALKPHOS 91 02/18/2019 1136   ALKPHOS 59 05/13/2017 0914   AST 20 02/18/2019 1136   AST 45 (H) 05/13/2017 0914   ALT 13 02/18/2019 1136   ALT 7 05/13/2017 0914   BILITOT 0.5 02/18/2019 1136   BILITOT 0.37 05/13/2017 0914       RADIOGRAPHIC STUDIES: I have personally reviewed the radiological images as listed and agreed with the findings in the report. Ct Head Wo Contrast  Result Date: 02/05/2019 CLINICAL DATA:  Worsening dementia EXAM: CT HEAD WITHOUT CONTRAST TECHNIQUE: Contiguous axial images were obtained from the base of the skull through the vertex without intravenous contrast. COMPARISON:  06/07/2014 FINDINGS: Brain: No evidence of acute infarction, hemorrhage, hydrocephalus, extra-axial collection or mass lesion/mass effect. Mild periventricular white matter hypodensity and global volume loss, minimally progressed compared to prior examination dated 2015. Vascular: No hyperdense vessel or unexpected calcification. Skull: Normal. Negative for fracture or focal lesion. Sinuses/Orbits: Chronic opacification of the right-sided paranasal sinuses. Other: None. IMPRESSION: 1. No acute intracranial pathology. Mild periventricular white matter hypodensity and global volume loss, minimally progressed compared to prior examination dated 2015. 2.  Chronic right-sided paranasal sinus disease. Electronically Signed   By: Eddie Candle M.D.   On: 02/05/2019 08:28    All questions were answered. The patient knows to call the clinic with any problems, questions or concerns. No barriers to learning was detected.  I spent 15 minutes counseling the patient  face to face. The total time spent in the appointment was 20 minutes and more than 50% was on counseling and review of test results  Heath Lark, MD 02/18/2019 2:19 PM

## 2019-02-18 NOTE — Assessment & Plan Note (Signed)
The patient is currently being evaluated by neurologist She appears very frail and had history of recent falls I will defer to neurologist for further management Overall, I do not feel that the patient will be fit enough to receive chemotherapy in the future, if her MGUS is worse or if the patient have recurrence of lymphoma I do not feel that she would benefit from future follow-up Once I have test results next week, I will make personal phone call to family member or primary care physician about future follow-up

## 2019-02-18 NOTE — Assessment & Plan Note (Signed)
Repeat MGUS is pending I will call her next week with test results She is noted to be very frail It is not clear to me that the patient will be well enough to receive any form of treatment in the future I will have further discussion about plan of care once I have full results with her family and primary care doctor next week

## 2019-02-19 LAB — KAPPA/LAMBDA LIGHT CHAINS
Kappa free light chain: 205.3 mg/L — ABNORMAL HIGH (ref 3.3–19.4)
Kappa, lambda light chain ratio: 19.19 — ABNORMAL HIGH (ref 0.26–1.65)
Lambda free light chains: 10.7 mg/L (ref 5.7–26.3)

## 2019-02-22 LAB — MULTIPLE MYELOMA PANEL, SERUM
Albumin SerPl Elph-Mcnc: 3.3 g/dL (ref 2.9–4.4)
Albumin/Glob SerPl: 1.1 (ref 0.7–1.7)
Alpha 1: 0.3 g/dL (ref 0.0–0.4)
Alpha2 Glob SerPl Elph-Mcnc: 0.6 g/dL (ref 0.4–1.0)
B-Globulin SerPl Elph-Mcnc: 0.6 g/dL — ABNORMAL LOW (ref 0.7–1.3)
Gamma Glob SerPl Elph-Mcnc: 1.6 g/dL (ref 0.4–1.8)
Globulin, Total: 3.1 g/dL (ref 2.2–3.9)
IgA: 50 mg/dL — ABNORMAL LOW (ref 64–422)
IgG (Immunoglobin G), Serum: 1925 mg/dL — ABNORMAL HIGH (ref 586–1602)
IgM (Immunoglobulin M), Srm: 14 mg/dL — ABNORMAL LOW (ref 26–217)
M Protein SerPl Elph-Mcnc: 1.2 g/dL — ABNORMAL HIGH
Total Protein ELP: 6.4 g/dL (ref 6.0–8.5)

## 2019-02-24 ENCOUNTER — Ambulatory Visit: Payer: Medicare Other | Admitting: Cardiology

## 2019-02-24 ENCOUNTER — Other Ambulatory Visit: Payer: Self-pay | Admitting: Cardiology

## 2019-02-24 ENCOUNTER — Telehealth: Payer: Self-pay | Admitting: Hematology and Oncology

## 2019-02-24 NOTE — Telephone Encounter (Signed)
Please fill if necessary

## 2019-02-24 NOTE — Telephone Encounter (Signed)
I spoke with the patient's daughter I reviewed recent test result of MGUS with her The patient is very frail and had multiple fall, bone fracture and recent diagnosis of dementia I do not recommend future follow-up It is unlikely that the patient will need treatment for multiple myeloma I addressed all her questions

## 2019-03-09 HISTORY — PX: SKIN BIOPSY: SHX1

## 2019-03-12 ENCOUNTER — Ambulatory Visit (INDEPENDENT_AMBULATORY_CARE_PROVIDER_SITE_OTHER): Payer: Medicare Other | Admitting: Cardiology

## 2019-03-12 ENCOUNTER — Ambulatory Visit: Payer: Medicare Other | Admitting: Cardiology

## 2019-03-12 ENCOUNTER — Telehealth: Payer: Self-pay

## 2019-03-12 ENCOUNTER — Other Ambulatory Visit: Payer: Self-pay

## 2019-03-12 ENCOUNTER — Encounter: Payer: Self-pay | Admitting: Cardiology

## 2019-03-12 VITALS — BP 201/100 | HR 51 | Ht 64.0 in | Wt 91.0 lb

## 2019-03-12 DIAGNOSIS — I10 Essential (primary) hypertension: Secondary | ICD-10-CM

## 2019-03-12 DIAGNOSIS — N183 Chronic kidney disease, stage 3 unspecified: Secondary | ICD-10-CM

## 2019-03-12 DIAGNOSIS — I951 Orthostatic hypotension: Secondary | ICD-10-CM

## 2019-03-12 DIAGNOSIS — I129 Hypertensive chronic kidney disease with stage 1 through stage 4 chronic kidney disease, or unspecified chronic kidney disease: Secondary | ICD-10-CM | POA: Diagnosis not present

## 2019-03-12 DIAGNOSIS — I480 Paroxysmal atrial fibrillation: Secondary | ICD-10-CM

## 2019-03-12 DIAGNOSIS — G250 Essential tremor: Secondary | ICD-10-CM

## 2019-03-12 NOTE — Progress Notes (Signed)
Primary Physician:  Gaynelle Arabian, MD   Patient ID: [name], female    DOB: 03-Feb-1926, 85 y.o.   MRN: [id number]  Subjective:    Chief Complaint  Patient presents with   Atrial Fibrillation   Follow-up    6 month    HPI: [name]  is a 85 y.o. female  with History of B-cell lymphoma without detectable signs of recurrence, history of CKD stage 3, and pancytopenia, history of essential tremors, essential hypertension, GERD, orthostatic hypotension, venous insufficiency, paroxysmal atrial fibrillation, and degenerative joint disease.  Patient is here on 6 month office visit. Since last seen by me 6 months ago, she has sustained a fall resulting right humerus fracture.  She was subsequently placed in skilled nursing facility during her recovery and had some altered mental status changes during this time, but these have now apparently improved since being back home. She is now at home with caregivers. She is followed by Dr. Carles Collet.  She is on amiodarone for management of her A. fib.  In regards to her tremors, she previously did well with primidone; however, had to be discontinued due to interaction with Eliquis.  She denies any bleeding diathesis with anticoagulation. Caregiver at bedside does report occasional episodes of dizziness in the morning that improve with taking a meclizine. Patient denies any heart racing or syncope. She has not had any falls since being at home.   She reports that her husband lives in a separate part of the house than she does.   Past Medical History:  Diagnosis Date   Allergy    Cancer ([place])    follicular lymphoma   Chronic atrial fibrillation 11/18/2017   Chronic kidney disease    COPD (chronic obstructive pulmonary disease) (HCC)    Depression    DJD (degenerative joint disease)    Dysuria 09/25/2015   Essential tremor    GERD (gastroesophageal reflux disease)    Heart murmur    had echo 20 yr ago-not sure where   Hx of  colonic polyp 10/02/05   Hypertension    MGUS (monoclonal gammopathy of unknown significance) 10/24/2015   MVP (mitral valve prolapse)    Orthostatic hypotension    Osteoporosis    Paroxysmal atrial fibrillation (HCC)    Venous insufficiency     Past Surgical History:  Procedure Laterality Date   APPENDECTOMY     CATARACT EXTRACTION Bilateral    COLONOSCOPY     DILATION AND CURETTAGE OF UTERUS     several   OVARIAN CYST REMOVAL     SKIN BIOPSY Right 03/09/2019   FACE AND LEG    Social History   Socioeconomic History   Marital status: Married    Spouse name: Not on file   Number of children: 1   Years of education: Not on file   Highest education level: Not on file  Occupational History   Occupation: Retired  Scientist, product/process development strain: Not on file   Food insecurity    Worry: Not on file    Inability: Not on Lexicographer needs    Medical: Not on file    Non-medical: Not on file  Tobacco Use   Smoking status: Never Smoker   Smokeless tobacco: Never Used  Substance and Sexual Activity   Alcohol use: No    Alcohol/week: 0.0 standard drinks   Drug use: No   Sexual activity: Not on file  Lifestyle   Physical activity  Days per week: Not on file    Minutes per session: Not on file   Stress: Not on file  Relationships   Social connections    Talks on phone: Not on file    Gets together: Not on file    Attends religious service: Not on file    Active member of club or organization: Not on file    Attends meetings of clubs or organizations: Not on file    Relationship status: Not on file   Intimate partner violence    Fear of current or ex partner: Not on file    Emotionally abused: Not on file    Physically abused: Not on file    Forced sexual activity: Not on file  Other Topics Concern   Not on file  Social History Narrative   Pt lives with husband who is bipolar and was wounded in [place]. They stay apart  much of the time.    Review of Systems  Constitution: Negative for decreased appetite, malaise/fatigue, weight gain and weight loss.  Eyes: Negative for visual disturbance.  Cardiovascular: Negative for chest pain, claudication, dyspnea on exertion, leg swelling, orthopnea, palpitations and syncope.  Respiratory: Negative for hemoptysis and wheezing.   Endocrine: Negative for cold intolerance and heat intolerance.  Hematologic/Lymphatic: Does not bruise/bleed easily.  Skin: Negative for nail changes.  Musculoskeletal: Negative for falls, muscle weakness and myalgias.  Gastrointestinal: Negative for abdominal pain, change in bowel habit, nausea and vomiting.  Neurological: Positive for dizziness, tremors (slight worsening) and weakness. Negative for difficulty with concentration, focal weakness and headaches.  Psychiatric/Behavioral: Negative for altered mental status and suicidal ideas.  All other systems reviewed and are negative.     Objective:  Blood pressure (!) 201/100, pulse (!) 51, height 5\' 4"  (1.626 m), weight 91 lb (41.3 kg), SpO2 96 %. Body mass index is 15.62 kg/m.    Physical Exam  Constitutional: She is oriented to person, place, and time. Vital signs are normal. She appears well-developed and well-nourished.  HENT:  Head: Normocephalic and atraumatic.  Neck: Normal range of motion.  Cardiovascular: Normal rate, regular rhythm, normal heart sounds and intact distal pulses.  Extrasystoles are present.  Pulses:      Dorsalis pedis pulses are 2+ on the right side and 2+ on the left side.       Posterior tibial pulses are 2+ on the right side and 2+ on the left side.  Bilateral non-pitting edema  Pulmonary/Chest: Effort normal and breath sounds normal. No accessory muscle usage. No respiratory distress.  Abdominal: Soft. Bowel sounds are normal.  Musculoskeletal: Normal range of motion.  Neurological: She is alert and oriented to person, place, and time.  Skin: Skin is  warm, dry and intact.  Vitals reviewed.  Radiology: No results found.  Laboratory examination:    CMP Latest Ref Rng & Units 02/18/2019 01/27/2019 09/02/2018  Glucose 70 - 99 mg/dL 125(H) 104(H) 111(H)  BUN 8 - 23 mg/dL 36(H) 31(H) 29(H)  Creatinine 0.44 - 1.00 mg/dL 1.67(H) 1.15 1.34(H)  Sodium 135 - 145 mmol/L 140 140 140  Potassium 3.5 - 5.1 mmol/L 3.2(L) 3.4(L) 3.5  Chloride 98 - 111 mmol/L 103 105 103  CO2 22 - 32 mmol/L 28 27 26   Calcium 8.9 - 10.3 mg/dL 9.5 8.8 9.1  Total Protein 6.5 - 8.1 g/dL 7.1 6.7 6.9  Total Bilirubin 0.3 - 1.2 mg/dL 0.5 0.5 0.9  Alkaline Phos 38 - 126 U/L 91 87 57  AST 15 -  41 U/L 20 14 19   ALT 0 - 44 U/L 13 10 12    CBC Latest Ref Rng & Units 02/18/2019 01/27/2019 09/02/2018  WBC 4.0 - 10.5 K/uL 4.8 3.5(L) 4.6  Hemoglobin 12.0 - 15.0 g/dL 11.1(L) 9.3(L) 10.3(L)  Hematocrit 36.0 - 46.0 % 35.1(L) 28.3(L) 32.1(L)  Platelets 150 - 400 K/uL 157 134.0(L) 129(L)   Lipid Panel     Component Value Date/Time   CHOL 169 01/30/2017 0718   TRIG 109 01/30/2017 0718   HDL 61 01/30/2017 0718   CHOLHDL 2.8 01/30/2017 0718   VLDL 22 01/30/2017 0718   LDLCALC 86 01/30/2017 0718   HEMOGLOBIN A1C No results found for: HGBA1C, MPG TSH Recent Labs    01/27/19 1347  TSH 0.86    PRN Meds:. Medications Discontinued During This Encounter  Medication Reason   chlordiazePOXIDE (LIBRIUM) 10 MG capsule Discontinued by provider   Current Meds  Medication Sig   acetaminophen (TYLENOL) 500 MG tablet Take 1-2 tablets (500-1,000 mg total) by mouth every 6 (six) hours as needed for moderate pain.   albuterol (PROVENTIL HFA;VENTOLIN HFA) 108 (90 Base) MCG/ACT inhaler Inhale 2 puffs into the lungs every 6 (six) hours as needed for wheezing or shortness of breath.   amiodarone (PACERONE) 200 MG tablet TAKE 1/2 TABLET BY MOUTH ONCE DAILY   apixaban (ELIQUIS) 2.5 MG TABS tablet Take 1 tablet (2.5 mg total) by mouth 2 (two) times daily.   atenolol (TENORMIN) 50 MG tablet  Take 50 mg by mouth daily.   Calcium Carbonate-Vitamin D (CALCIUM-D) 600-400 MG-UNIT TABS Take 1 tablet by mouth as needed (if needed).    clindamycin (CLEOCIN) 150 MG capsule Take 4 capsules by mouth as needed. As needed for dental procedures   dextromethorphan-guaiFENesin (MUCINEX DM) 30-600 MG per 12 hr tablet Take 1 tablet by mouth daily as needed (sinus).    donepezil (ARICEPT) 10 MG tablet Take 1 tablet (10 mg total) by mouth at bedtime.   famotidine (PEPCID) 20 MG tablet TK 1 T PO BID FOR STOMACH ACID   fluticasone (FLONASE) 50 MCG/ACT nasal spray Place 2 sprays into the nose daily as needed for allergies.    Glycerin-Hypromellose-PEG 400 ([facility] DRY EYE RELIEF) 0.2-0.2-1 % SOLN Place 1 drop into both eyes as needed (dry eye).   hydrochlorothiazide (MICROZIDE) 12.5 MG capsule Take 1 capsule (12.5 mg total) by mouth daily.   HYDROcodone-acetaminophen (NORCO/VICODIN) 5-325 MG tablet Take 0.5 tablets by mouth every 4 (four) hours as needed for moderate pain.   hyoscyamine (LEVSIN SL) 0.125 MG SL tablet Place 0.125 mg under the tongue every 4 (four) hours as needed for cramping. Reported on 09/25/2015   loperamide (IMODIUM A-D) 2 MG tablet Take 2 mg by mouth 4 (four) times daily as needed for diarrhea or loose stools.   losartan (COZAAR) 50 MG tablet Take 1 tablet (50 mg total) by mouth daily. (Patient taking differently: Take 25-50 mg by mouth as needed (if she does not feel well). )   meclizine (ANTIVERT) 50 MG tablet Take 0.5 tablets (25 mg total) by mouth 3 (three) times daily as needed.   Multiple Vitamin (MULTIVITAMIN) capsule Take 1 capsule by mouth daily.   oxyCODONE (ROXICODONE) 5 MG immediate release tablet Take 0.5 tablets (2.5 mg total) by mouth every 6 (six) hours as needed for severe pain or breakthrough pain.   propranolol ER (INDERAL LA) 120 MG 24 hr capsule Take 120 mg by mouth 2 (two) times daily.   ranitidine (ZANTAC) 300 MG  tablet Take 150 mg by mouth 2 (two) times  daily.    sertraline (ZOLOFT) 50 MG tablet Take 50 mg by mouth daily.   traZODone (DESYREL) 50 MG tablet TK ONE-HALF TO 1 T PO BEFORE BED PRF SLEEP PROBLEMS    Cardiac Studies:   Echo 01/23/2017: Normal LV size with EF 60-65%. Moderate diastolic dysfunction.Normal RV size and systolic function. Moderate to severe biatrial enlargement. Mild MR.   Assessment:     ICD-10-CM   1. Paroxysmal atrial fibrillation (HCC)  I48.0 EKG 12-Lead  2. Essential hypertension  W97 Basic metabolic panel    Basic metabolic panel  3. Orthostatic hypotension  I95.1   4. CKD (chronic kidney disease) stage 3, GFR 30-59 ml/min (HCC)  N18.3   5. Essential tremor  G25.0      EKG 03/12/2019: Sinus bradycardia/ sinus arrhythmia with first-degree AV block at the rate of 52 bpm, left atrial enlargement, left axis. Incomplete right bundle branch block. PRWP, anteroseptal infarct old. LVH. QT stable. Abnormal EKG. Compared to EKG 03/10/2018, lateral ST depression is not noted. .   Recommendations:   Patient has had deterioration in her overall health over the last 6 months, but does feel that she is doing somewhat better.  She previously was very independent and active; however, now she has a caregiver and needs assistance doing her normal activities.  But does state that her mental status and overall capability of doing things has been improving.  She does continue to have intermittent episodes of dizziness, particularly in the morning with position changes.  While I do suspect her symptoms are related to her history of orthostatic hypotension, she does mention that this improves with use of meclizine.  She is not having any significant leg edema with use of leg wraps.  Her blood pressure is markedly elevated today in our office, but previously has been fairly well controlled.  There are some questions regarding her medications and she did not bring them with her today.  Her caregiver will call our office later today to  clarify particularly her propranolol and atenolol.  She is also markedly bradycardic, but is asymptomatic at this time.  She is also unsure if she has regularly been taking losartan and will notify us of this.  Recently had labs performed showed significant worsening in her kidney function compared to previous.  I will repeat BMP for follow-up on this and will determine which medication changes may be made to better control her blood pressure.  Will consider either taking losartan on a regular basis if kidney function will allow.  Could also consider addition of small dose of amlodipine; however, may make orthostatic hypotension worse and will need to closely watch this.    Would recommend continuing with propranolol in view of essential tremors.  She did previously have improvement in tremors with primidone; however, due to Eliquis this had to be discontinued.  Her only option if she needed to go back on primidone would be to change to Coumadin, which would require frequent traveler office for INR checks and patient is not interested in this.  She is in sinus rhythm today, has not had any heart racing symptoms.  Does report that she is working to gain weight and has gained some of her weight back.  I will plan to see her back in 6 weeks to follow-up on hypertension, but will notify them of having lab results and make changes to her medications for hypertension at that time.   *  Addendum: Patient is taking losartan 100 mg daily and propanolol 120 mg daily. She is not on atenolol. Will continue with BMP for follow up on CKD and also regular home monitoring.  Miquel Dunn, MSN, APRN, FNP-C [facility]. [place] Office: [phone] Fax: [phone]

## 2019-03-12 NOTE — Telephone Encounter (Signed)
Med list is now up to date. She is not on Atenolol,also  losartan dose was wrong also she takes 100 mg daily

## 2019-03-16 ENCOUNTER — Encounter: Payer: Self-pay | Admitting: Cardiology

## 2019-03-24 LAB — BASIC METABOLIC PANEL
BUN/Creatinine Ratio: 27 (ref 12–28)
BUN: 33 mg/dL (ref 10–36)
CO2: 31 mmol/L — ABNORMAL HIGH (ref 20–29)
Calcium: 9.4 mg/dL (ref 8.7–10.3)
Chloride: 95 mmol/L — ABNORMAL LOW (ref 96–106)
Creatinine, Ser: 1.21 mg/dL — ABNORMAL HIGH (ref 0.57–1.00)
GFR calc Af Amer: 46 mL/min/{1.73_m2} — ABNORMAL LOW (ref >59)
GFR calc non Af Amer: 39 mL/min/{1.73_m2} — ABNORMAL LOW (ref >59)
Glucose: 127 mg/dL — ABNORMAL HIGH (ref 65–99)
Potassium: 2.9 mmol/L — ABNORMAL LOW (ref 3.5–5.2)
Sodium: 140 mmol/L (ref 134–144)

## 2019-03-24 NOTE — Progress Notes (Signed)
LM with daughter Stanton Kidney. Helene Kelp whom is just a family friend that takes care of pt during the does not check pt's bp. Some director came over last night and Stanton Kidney is going to call to see if pt's bp was checked then and call me back.//ah

## 2019-04-02 NOTE — Progress Notes (Signed)
[name] called back with some bp readings: 9/8- 124/65 HR52 9/7- 113/68 Hr57 9/6- 176/84 Hr50 9/5- 141/79 Hr50 She said she could more recent if needed but did not have them on hand at time of call.//ah

## 2019-04-05 NOTE — Progress Notes (Signed)
Thats okay, overall looks like her BP is fairly good. Will continue with the same and will discuss at her upcoming appt in October

## 2019-04-07 NOTE — Progress Notes (Signed)
[facility] aware.//ah

## 2019-04-23 ENCOUNTER — Ambulatory Visit: Payer: Medicare Other | Admitting: Cardiology

## 2019-04-30 ENCOUNTER — Encounter: Payer: Self-pay | Admitting: Neurology

## 2019-05-03 ENCOUNTER — Ambulatory Visit: Payer: Medicare Other | Admitting: Cardiology

## 2019-05-11 ENCOUNTER — Ambulatory Visit: Payer: Medicare Other | Admitting: Cardiology

## 2019-05-11 ENCOUNTER — Other Ambulatory Visit: Payer: Self-pay

## 2019-05-11 ENCOUNTER — Encounter: Payer: Self-pay | Admitting: Cardiology

## 2019-05-11 VITALS — BP 131/71 | HR 56

## 2019-05-11 DIAGNOSIS — I10 Essential (primary) hypertension: Secondary | ICD-10-CM

## 2019-05-11 DIAGNOSIS — I129 Hypertensive chronic kidney disease with stage 1 through stage 4 chronic kidney disease, or unspecified chronic kidney disease: Secondary | ICD-10-CM | POA: Diagnosis not present

## 2019-05-11 DIAGNOSIS — N1831 Chronic kidney disease, stage 3a: Secondary | ICD-10-CM | POA: Diagnosis not present

## 2019-05-11 DIAGNOSIS — I480 Paroxysmal atrial fibrillation: Secondary | ICD-10-CM | POA: Diagnosis not present

## 2019-05-11 DIAGNOSIS — I951 Orthostatic hypotension: Secondary | ICD-10-CM | POA: Diagnosis not present

## 2019-05-11 NOTE — Progress Notes (Signed)
Primary Physician:  Gaynelle Arabian, MD   Patient ID: [name], female    DOB: 06/28/1926, 85 y.o.   MRN: [id number]  Subjective:    Chief Complaint  Patient presents with  . paf  . Hypertension    HPI: [name]  is a 85 y.o. female  with History of B-cell lymphoma without detectable signs of recurrence, history of CKD stage 3, and pancytopenia, history of essential tremors, essential hypertension, GERD, orthostatic hypotension, venous insufficiency, paroxysmal atrial fibrillation, and degenerative joint disease.  She had sustained a fall resulting right humerus fracture in Feb and is still recovering from this. She is now at home with caregivers. Her strength is improving. She is followed by Dr. Carles Collet.  She is on amiodarone for management of her A. fib.  In regards to her tremors, she previously did well with primidone; however, had to be discontinued due to interaction with Eliquis.  At her last office visit, she was noted to have worsening kidney function and elevated blood pressure. Kidney function has improved on recheck and blood pressure at home has been stable. No complaints today. She denies any bleeding diathesis with anticoagulation. She does have occasional episodes of dizziness in the morning that improve with taking a meclizine. No syncope. She does report elevated heart rate a few days ago, but states that she was aggravated at someone being in her house; otherwise no issues with this. She has not had any falls since being at home. Daughter is present at bedside.   She reports that her husband lives in a separate part of the house than she does.   Past Medical History:  Diagnosis Date  . Allergy   . Cancer ([place])    follicular lymphoma  . Chronic atrial fibrillation ([place]) 11/18/2017  . Chronic kidney disease   . COPD (chronic obstructive pulmonary disease) ([place])   . Depression   . DJD (degenerative joint disease)   . Dysuria 09/25/2015  . Essential tremor    . GERD (gastroesophageal reflux disease)   . Heart murmur    had echo 20 yr ago-not sure where  . Hx of colonic polyp 10/02/05  . Hypertension   . MGUS (monoclonal gammopathy of unknown significance) 10/24/2015  . MVP (mitral valve prolapse)   . Orthostatic hypotension   . Osteoporosis   . Paroxysmal atrial fibrillation (HCC)   . Venous insufficiency     Past Surgical History:  Procedure Laterality Date  . APPENDECTOMY    . CATARACT EXTRACTION Bilateral   . COLONOSCOPY    . DILATION AND CURETTAGE OF UTERUS     several  . OVARIAN CYST REMOVAL    . SKIN BIOPSY Right 03/09/2019   FACE AND LEG    Social History   Socioeconomic History  . Marital status: Married    Spouse name: Not on file  . Number of children: 1  . Years of education: Not on file  . Highest education level: Not on file  Occupational History  . Occupation: Retired  Scientific laboratory technician  . Financial resource strain: Not on file  . Food insecurity    Worry: Not on file    Inability: Not on file  . Transportation needs    Medical: Not on file    Non-medical: Not on file  Tobacco Use  . Smoking status: Never Smoker  . Smokeless tobacco: Never Used  Substance and Sexual Activity  . Alcohol use: No    Alcohol/week: 0.0 standard drinks  .  Drug use: No  . Sexual activity: Not on file  Lifestyle  . Physical activity    Days per week: Not on file    Minutes per session: Not on file  . Stress: Not on file  Relationships  . Social Herbalist on phone: Not on file    Gets together: Not on file    Attends religious service: Not on file    Active member of club or organization: Not on file    Attends meetings of clubs or organizations: Not on file    Relationship status: Not on file  . Intimate partner violence    Fear of current or ex partner: Not on file    Emotionally abused: Not on file    Physically abused: Not on file    Forced sexual activity: Not on file  Other Topics Concern  . Not on  file  Social History Narrative   Pt lives with husband who is bipolar and was wounded in [place]. They stay apart much of the time.    Review of Systems  Constitution: Negative for decreased appetite, malaise/fatigue, weight gain and weight loss.  Eyes: Negative for visual disturbance.  Cardiovascular: Negative for chest pain, claudication, dyspnea on exertion, leg swelling, orthopnea, palpitations and syncope.  Respiratory: Negative for hemoptysis and wheezing.   Endocrine: Negative for cold intolerance and heat intolerance.  Hematologic/Lymphatic: Does not bruise/bleed easily.  Skin: Negative for nail changes.  Musculoskeletal: Negative for falls, muscle weakness and myalgias.  Gastrointestinal: Negative for abdominal pain, change in bowel habit, nausea and vomiting.  Neurological: Positive for dizziness, tremors (slight worsening) and weakness. Negative for difficulty with concentration, focal weakness and headaches.  Psychiatric/Behavioral: Negative for altered mental status and suicidal ideas.  All other systems reviewed and are negative.     Objective:  Blood pressure 131/71, pulse (!) 56, SpO2 97 %. There is no height or weight on file to calculate BMI.    Physical Exam  Constitutional: She is oriented to person, place, and time. Vital signs are normal. She appears well-developed and well-nourished.  HENT:  Head: Normocephalic and atraumatic.  Neck: Normal range of motion.  Cardiovascular: Normal rate, regular rhythm, normal heart sounds and intact distal pulses.  Extrasystoles are present.  Pulses:      Dorsalis pedis pulses are 2+ on the right side and 2+ on the left side.       Posterior tibial pulses are 2+ on the right side and 2+ on the left side.  Bilateral non-pitting edema  Pulmonary/Chest: Effort normal and breath sounds normal. No accessory muscle usage. No respiratory distress.  Abdominal: Soft. Bowel sounds are normal.  Musculoskeletal: Normal range of motion.   Neurological: She is alert and oriented to person, place, and time.  Skin: Skin is warm, dry and intact.  Vitals reviewed.  Radiology: No results found.  Laboratory examination:    CMP Latest Ref Rng & Units 03/23/2019 02/18/2019 01/27/2019  Glucose 65 - 99 mg/dL 127(H) 125(H) 104(H)  BUN 10 - 36 mg/dL 33 36(H) 31(H)  Creatinine 0.57 - 1.00 mg/dL 1.21(H) 1.67(H) 1.15  Sodium 134 - 144 mmol/L 140 140 140  Potassium 3.5 - 5.2 mmol/L 2.9(L) 3.2(L) 3.4(L)  Chloride 96 - 106 mmol/L 95(L) 103 105  CO2 20 - 29 mmol/L 31(H) 28 27  Calcium 8.7 - 10.3 mg/dL 9.4 9.5 8.8  Total Protein 6.5 - 8.1 g/dL - 7.1 6.7  Total Bilirubin 0.3 - 1.2 mg/dL - 0.5 0.5  Alkaline Phos 38 - 126 U/L - 91 87  AST 15 - 41 U/L - 20 14  ALT 0 - 44 U/L - 13 10   CBC Latest Ref Rng & Units 02/18/2019 01/27/2019 09/02/2018  WBC 4.0 - 10.5 K/uL 4.8 3.5(L) 4.6  Hemoglobin 12.0 - 15.0 g/dL 11.1(L) 9.3(L) 10.3(L)  Hematocrit 36.0 - 46.0 % 35.1(L) 28.3(L) 32.1(L)  Platelets 150 - 400 K/uL 157 134.0(L) 129(L)   Lipid Panel     Component Value Date/Time   CHOL 169 01/30/2017 0718   TRIG 109 01/30/2017 0718   HDL 61 01/30/2017 0718   CHOLHDL 2.8 01/30/2017 0718   VLDL 22 01/30/2017 0718   LDLCALC 86 01/30/2017 0718   HEMOGLOBIN A1C No results found for: HGBA1C, MPG TSH Recent Labs    01/27/19 1347  TSH 0.86    PRN Meds:. Medications Discontinued During This Encounter  Medication Reason  . albuterol (PROVENTIL HFA;VENTOLIN HFA) 108 (90 Base) MCG/ACT inhaler Error  . Calcium Carbonate-Vitamin D (CALCIUM-D) 600-400 MG-UNIT TABS Error  . HYDROcodone-acetaminophen (NORCO/VICODIN) 5-325 MG tablet Error  . Multiple Vitamin (MULTIVITAMIN) capsule Error  . oxyCODONE (ROXICODONE) 5 MG immediate release tablet Error  . ranitidine (ZANTAC) 300 MG tablet Error   Current Meds  Medication Sig  . acetaminophen (TYLENOL) 500 MG tablet Take 1-2 tablets (500-1,000 mg total) by mouth every 6 (six) hours as needed for moderate  pain.  Marland Kitchen amiodarone (PACERONE) 200 MG tablet TAKE 1/2 TABLET BY MOUTH ONCE DAILY  . apixaban (ELIQUIS) 2.5 MG TABS tablet Take 1 tablet (2.5 mg total) by mouth 2 (two) times daily.  . clindamycin (CLEOCIN) 150 MG capsule Take 4 capsules by mouth as needed. As needed for dental procedures  . dextromethorphan-guaiFENesin (MUCINEX DM) 30-600 MG per 12 hr tablet Take 1 tablet by mouth daily as needed (sinus).   . donepezil (ARICEPT) 10 MG tablet Take 1 tablet (10 mg total) by mouth at bedtime.  . famotidine (PEPCID) 20 MG tablet TK 1 T PO BID FOR STOMACH ACID  . fluticasone (FLONASE) 50 MCG/ACT nasal spray Place 2 sprays into the nose daily as needed for allergies.   . Glycerin-Hypromellose-PEG 400 ([employer] DRY EYE RELIEF) 0.2-0.2-1 % SOLN Place 1 drop into both eyes as needed (dry eye).  . hydrochlorothiazide (MICROZIDE) 12.5 MG capsule Take 1 capsule (12.5 mg total) by mouth daily.  . hyoscyamine (LEVSIN SL) 0.125 MG SL tablet Place 0.125 mg under the tongue every 4 (four) hours as needed for cramping. Reported on 09/25/2015  . loperamide (IMODIUM A-D) 2 MG tablet Take 2 mg by mouth 4 (four) times daily as needed for diarrhea or loose stools.  Marland Kitchen losartan (COZAAR) 100 MG tablet Take 100 mg by mouth daily.  . meclizine (ANTIVERT) 50 MG tablet Take 0.5 tablets (25 mg total) by mouth 3 (three) times daily as needed.  . propranolol ER (INDERAL LA) 120 MG 24 hr capsule Take 120 mg by mouth 2 (two) times daily.  . sertraline (ZOLOFT) 100 MG tablet Take 50 mg by mouth daily.   . traZODone (DESYREL) 50 MG tablet TK ONE-HALF TO 1 T PO BEFORE BED PRF SLEEP PROBLEMS    Cardiac Studies:   Echo 01/23/2017: Normal LV size with EF 60-65%. Moderate diastolic dysfunction.Normal RV size and systolic function. Moderate to severe biatrial enlargement. Mild MR.   Assessment:     ICD-10-CM   1. Paroxysmal atrial fibrillation (HCC)  I48.0   2. Essential hypertension  I10   3. Orthostatic hypotension  I95.1   4. Stage  3a chronic kidney disease  N18.31      EKG 03/12/2019: Sinus bradycardia/ sinus arrhythmia with first-degree AV block at the rate of 52 bpm, left atrial enlargement, left axis. Incomplete right bundle branch block. PRWP, anteroseptal infarct old. LVH. QT stable. Abnormal EKG. Compared to EKG 03/10/2018, lateral ST depression is not noted.   Recommendations:   Patient is here for 6-week follow-up visit for hypertension.  Blood pressure is well controlled today and has been much better controlled at home.  We will continue with current medications.  Kidney function was also repeated and showed significant improvement in creatinine level.  She will need continued close monitoring of this.  Overall, patient has remained stable from a cardiac standpoint.  She has occasional dizziness that is resolved with meclizine.  She does have a history of orthostatic hypotension, but it does not appear her dizziness is related to this as it improves with meclizine.  She has not had any syncope, heart racing, or shortness of breath.  Leg edema remains stable with daily leg wrapping.  She is on Eliquis for anticoagulation and tolerates this well.  Her essential tremors have been stable.  If she was to have worsening tremors, and needed to go back on primidone, her option would be to changing to Coumadin.  Patient hopes to avoid this if possible due to frequent monitoring that would be needed.  No changes were made to her medications today.  I will see her back in 6 months or sooner if problems.  If she does not have labs performed prior to her next office visit with me, will order CBC and BMP.  Miquel Dunn, MSN, APRN, FNP-C [facility]. [place] Office: [phone] Fax: [phone]

## 2019-06-01 ENCOUNTER — Ambulatory Visit: Payer: Medicare Other | Admitting: Neurology

## 2019-06-24 ENCOUNTER — Other Ambulatory Visit: Payer: Self-pay

## 2019-06-24 DIAGNOSIS — I480 Paroxysmal atrial fibrillation: Secondary | ICD-10-CM

## 2019-06-24 MED ORDER — PROPRANOLOL HCL ER 120 MG PO CP24: 120.0000 mg | ORAL_CAPSULE | Freq: Two times a day (BID) | ORAL | 3 refills | Status: DC

## 2019-06-25 ENCOUNTER — Telehealth: Payer: Medicare Other | Admitting: Neurology

## 2019-06-25 NOTE — Progress Notes (Signed)
Virtual Visit via Video Note The purpose of this virtual visit is to provide medical care while limiting exposure to the novel coronavirus.    Consent was obtained for video visit:  Yes.   Answered questions that patient had about telehealth interaction:  Yes.   I discussed the limitations, risks, security and privacy concerns of performing an evaluation and management service by telemedicine. I also discussed with the patient that there may be a patient responsible charge related to this service. The patient expressed understanding and agreed to proceed.  Pt location: Home Physician Location: office Name of referring provider:  Gaynelle Arabian, MD I connected with [name] at patients initiation/request on 06/28/2019 at  2:00 PM EST by video enabled telemedicine application and verified that I am speaking with the correct person using two identifiers. Pt MRN:  [id number] Pt DOB:  1925/12/11 Video Participants:  [name];  Daughter supplements hx   History of Present Illness:  Patient is seen today in follow-up for memory change.  She was started on donepezil last visit.  She was started on 5 mg and work to 10 mg daily.  She has no side effects with her medication.  She did live with her estranged husband, but they only lived together for financial purposes, not for caregiving purposes.  She now has 24 hour per day caregiving in the home.   Her daughter called me in July regarding confusion and hallucinations.  Her lab work was fairly unremarkable.  CT of the brain was nonacute.  Neurocognitive testing was discussed, but declined at the time.  Daughter states that that has all cleared up.  No hallucinations any longer.  Her daughter thinks that having 24 hour per day caregiving helped the hallucination issue.  Daughter states that it lasted about 3 months.  She is still on propranolol for her essential tremor.  I have reviewed records from Dr. Alvy Bimler.  Patient does have history of  MGUS.  She overall did not feel that the patient likely will be fit enough to receive chemotherapy in the future, if MGUS was worse or if the patient had a recurrence of lymphoma.    Observations/Objective:   Vitals:   06/28/19 1353  Weight: 105 lb (47.6 kg)  Height: 5\' 3"  (1.6 m)   Current Outpatient Medications on File Prior to Visit  Medication Sig Dispense Refill   acetaminophen (TYLENOL) 500 MG tablet Take 1-2 tablets (500-1,000 mg total) by mouth every 6 (six) hours as needed for moderate pain. 30 tablet 0   amiodarone (PACERONE) 200 MG tablet TAKE 1/2 TABLET BY MOUTH ONCE DAILY 90 tablet 1   apixaban (ELIQUIS) 2.5 MG TABS tablet Take 1 tablet (2.5 mg total) by mouth 2 (two) times daily. 60 tablet 2   clindamycin (CLEOCIN) 150 MG capsule Take 4 capsules by mouth as needed. As needed for dental procedures  0   dextromethorphan-guaiFENesin (MUCINEX DM) 30-600 MG per 12 hr tablet Take 1 tablet by mouth daily as needed (sinus).      famotidine (PEPCID) 20 MG tablet TK 1 T PO BID FOR STOMACH ACID     fluticasone (FLONASE) 50 MCG/ACT nasal spray Place 2 sprays into the nose daily as needed for allergies.      Glycerin-Hypromellose-PEG 400 ([employer] DRY EYE RELIEF) 0.2-0.2-1 % SOLN Place 1 drop into both eyes as needed (dry eye).     hydrochlorothiazide (MICROZIDE) 12.5 MG capsule Take 1 capsule (12.5 mg total) by mouth daily. (Patient taking  differently: Take 25 mg by mouth daily. ) 30 capsule 2   hyoscyamine (LEVSIN SL) 0.125 MG SL tablet Place 0.125 mg under the tongue every 4 (four) hours as needed for cramping. Reported on 09/25/2015     loperamide (IMODIUM A-D) 2 MG tablet Take 2 mg by mouth 4 (four) times daily as needed for diarrhea or loose stools.     losartan (COZAAR) 100 MG tablet Take 100 mg by mouth daily.     meclizine (ANTIVERT) 50 MG tablet Take 0.5 tablets (25 mg total) by mouth 3 (three) times daily as needed. 30 tablet 0   propranolol ER (INDERAL LA) 120 MG 24 hr  capsule Take 1 capsule (120 mg total) by mouth 2 (two) times daily. 180 capsule 3   sertraline (ZOLOFT) 100 MG tablet Take 100 mg by mouth daily.      traZODone (DESYREL) 50 MG tablet TK ONE-HALF TO 1 T PO BEFORE BED PRF SLEEP PROBLEMS     No current facility-administered medications on file prior to visit.      GEN:  The patient appears stated age and is in NAD.  Neurological examination:  Orientation: She is alert and oriented to person, place and time.  She even asks the examiner about her twins. Montreal Cognitive Assessment  11/25/2018  Visuospatial/ Executive (0/5) 1  Naming (0/3) 3  Attention: Read list of digits (0/2) 2  Attention: Read list of letters (0/1) 1  Attention: Serial 7 subtraction starting at 100 (0/3) 2  Language: Repeat phrase (0/2) 0  Language : Fluency (0/1) 1  Abstraction (0/2) 1  Delayed Recall (0/5) 3  Orientation (0/6) 4  Total 18    Cranial nerves: There is good facial symmetry. There is no facial hypomimia.  The speech is fluent and clear. Soft palate rises symmetrically and there is no tongue deviation. Hearing is intact to conversational tone. Motor: Strength is at least antigravity x 4.   Shoulder shrug is equal and symmetric.  There is no pronator drift.  Movement examination: Tone: unable Abnormal movements: She has moderate postural and intention tremor. Coordination:  There is no decremation with RAM's Gait and Station: Not tested.  The patient is lying in her lounge chair.     Chemistry      Component Value Date/Time   NA 140 03/23/2019 1144   NA 140 05/13/2017 0914   K 2.9 (L) 03/23/2019 1144   K 3.8 05/13/2017 0914   CL 95 (L) 03/23/2019 1144   CL 103 10/09/2012 1036   CO2 31 (H) 03/23/2019 1144   CO2 29 05/13/2017 0914   BUN 33 03/23/2019 1144   BUN 24.9 05/13/2017 0914   CREATININE 1.21 (H) 03/23/2019 1144   CREATININE 1.4 (H) 05/13/2017 0914      Component Value Date/Time   CALCIUM 9.4 03/23/2019 1144   CALCIUM 9.4  05/13/2017 0914   ALKPHOS 91 02/18/2019 1136   ALKPHOS 59 05/13/2017 0914   AST 20 02/18/2019 1136   AST 45 (H) 05/13/2017 0914   ALT 13 02/18/2019 1136   ALT 7 05/13/2017 0914   BILITOT 0.5 02/18/2019 1136   BILITOT 0.37 05/13/2017 0914     Lab Results  Component Value Date   VITAMINB12 725 05/19/2018   Lab Results  Component Value Date   TSH 0.86 01/27/2019     Assessment and Plan:   1.  Memory loss  -suspect Alzheimer dementia  -Does not wish to proceed with neurocognitive testing.  -will get labs  from pcp  -continue aricept 10 mg daily  -now with 24 hour per day caregiving.  Suspect that things got better after she got 24/day care and now she has consistent people giving her medication.  2.  Essential tremor  -Was worsened when she was placed on amiodarone.  -Primidone helps, but had to be discontinued when placed on Eliquis  -On propranolol  Follow Up Instructions:  f/u 1 year  -I discussed the assessment and treatment plan with the patient. The patient was provided an opportunity to ask questions and all were answered. The patient agreed with the plan and demonstrated an understanding of the instructions.   The patient was advised to call back or seek an in-person evaluation if the symptoms worsen or if the condition fails to improve as anticipated.    Total Time spent in visit with the patient was:  20 min, with 50% in counseling.   Pt understands and agrees with the plan of care outlined.   time referenced above did not include the 35 min of record review which was detailed above, which was non face to face time.    Mary Bogus, DO

## 2019-06-28 ENCOUNTER — Telehealth (INDEPENDENT_AMBULATORY_CARE_PROVIDER_SITE_OTHER): Payer: Medicare Other | Admitting: Neurology

## 2019-06-28 ENCOUNTER — Encounter: Payer: Self-pay | Admitting: Neurology

## 2019-06-28 ENCOUNTER — Other Ambulatory Visit: Payer: Self-pay

## 2019-06-28 VITALS — BP 134/67 | HR 52 | Temp 97.6°F | Resp 18 | Ht 63.0 in | Wt 105.0 lb

## 2019-06-28 DIAGNOSIS — F0280 Dementia in other diseases classified elsewhere without behavioral disturbance: Secondary | ICD-10-CM | POA: Diagnosis not present

## 2019-06-28 DIAGNOSIS — G250 Essential tremor: Secondary | ICD-10-CM | POA: Diagnosis not present

## 2019-06-28 DIAGNOSIS — G301 Alzheimer's disease with late onset: Secondary | ICD-10-CM | POA: Diagnosis not present

## 2019-06-28 MED ORDER — DONEPEZIL HCL 10 MG PO TABS: 10.0000 mg | ORAL_TABLET | Freq: Every day | ORAL | 1 refills | Status: DC

## 2019-07-11 ENCOUNTER — Telehealth: Payer: Self-pay | Admitting: Unknown Physician Specialty

## 2019-07-11 ENCOUNTER — Other Ambulatory Visit: Payer: Self-pay | Admitting: Unknown Physician Specialty

## 2019-07-11 DIAGNOSIS — U071 COVID-19: Secondary | ICD-10-CM

## 2019-07-11 NOTE — Progress Notes (Signed)
  I connected by phone with [name] daughter on 07/11/2019 at 4:41 PM to discuss the potential use of an new treatment for mild to moderate COVID-19 viral infection in non-hospitalized patients.  Tested outside of [place]  This patient is a 85 y.o. female that meets the FDA criteria for Emergency Use Authorization of bamlanivimab or casirivimab\imdevimab.  Has a (+) direct SARS-CoV-2 viral test result  Has mild or moderate COVID-19   Is ? 85 years of age and weighs ? 40 kg  Is NOT hospitalized due to COVID-19  Is NOT requiring oxygen therapy or requiring an increase in baseline oxygen flow rate due to COVID-19  Is within 10 days of symptom onset  Has at least one of the high risk factor(s) for progression to severe COVID-19 and/or hospitalization as defined in EUA.  Specific high risk criteria : >/= 83 yo   I have spoken and communicated the following to the patient or parent/caregiver:  1. FDA has authorized the emergency use of bamlanivimab and casirivimab\imdevimab for the treatment of mild to moderate COVID-19 in adults and pediatric patients with positive results of direct SARS-CoV-2 viral testing who are 23 years of age and older weighing at least 40 kg, and who are at high risk for progressing to severe COVID-19 and/or hospitalization.  2. The significant known and potential risks and benefits of bamlanivimab and casirivimab\imdevimab, and the extent to which such potential risks and benefits are unknown.  3. Information on available alternative treatments and the risks and benefits of those alternatives, including clinical trials.  4. Patients treated with bamlanivimab and casirivimab\imdevimab should continue to self-isolate and use infection control measures (e.g., wear mask, isolate, social distance, avoid sharing personal items, clean and disinfect "high touch" surfaces, and frequent handwashing) according to CDC guidelines.   5. The patient or parent/caregiver  has the option to accept or refuse bamlanivimab or casirivimab\imdevimab .  After reviewing this information with the patient, The patient agreed to proceed with receiving the bamlanimivab infusion and will be provided a copy of the Fact sheet prior to receiving the infusion.[name] 07/11/2019 4:41 PM

## 2019-07-11 NOTE — Telephone Encounter (Signed)
Discussed with daughter about pt's symptoms.  She is listless, has a cough, and oxygen is 89% and oxygen 91%  Symptoms for 5 days.    She is a candidate for monoclonal antibodies due to age and co-morbid conditions.  Sees a [place] PCP  Will set up for infusion.

## 2019-07-14 ENCOUNTER — Encounter (HOSPITAL_COMMUNITY): Payer: Self-pay

## 2019-07-14 ENCOUNTER — Ambulatory Visit (HOSPITAL_COMMUNITY)
Admission: RE | Admit: 2019-07-14 | Discharge: 2019-07-14 | Disposition: A | Discharge status: Home / Self Care | Payer: Medicare Other | Source: Ambulatory Visit | Attending: Pulmonary Disease | Admitting: Pulmonary Disease

## 2019-07-14 DIAGNOSIS — Z23 Encounter for immunization: Secondary | ICD-10-CM | POA: Diagnosis not present

## 2019-07-14 DIAGNOSIS — U071 COVID-19: Secondary | ICD-10-CM | POA: Diagnosis not present

## 2019-07-14 MED ORDER — METHYLPREDNISOLONE SODIUM SUCC 125 MG IJ SOLR: 125.0000 mg | Freq: Once | INTRAMUSCULAR | Status: DC | PRN

## 2019-07-14 MED ORDER — FAMOTIDINE IN NACL 20-0.9 MG/50ML-% IV SOLN: 20.0000 mg | Freq: Once | INTRAVENOUS | Status: DC | PRN

## 2019-07-14 MED ORDER — DIPHENHYDRAMINE HCL 50 MG/ML IJ SOLN: 50.0000 mg | Freq: Once | INTRAMUSCULAR | Status: DC | PRN

## 2019-07-14 MED ORDER — SODIUM CHLORIDE 0.9 % IV SOLN
INTRAVENOUS | Status: DC | PRN
  Administered 2019-07-14: 09:00:00 250 mL via INTRAVENOUS

## 2019-07-14 MED ORDER — ALBUTEROL SULFATE HFA 108 (90 BASE) MCG/ACT IN AERS: 2.0000 | INHALATION_SPRAY | Freq: Once | RESPIRATORY_TRACT | Status: DC | PRN

## 2019-07-14 MED ORDER — EPINEPHRINE 0.3 MG/0.3ML IJ SOAJ: 0.3000 mg | Freq: Once | INTRAMUSCULAR | Status: DC | PRN

## 2019-07-14 MED ORDER — SODIUM CHLORIDE 0.9 % IV SOLN
700.0000 mg | Freq: Once | INTRAVENOUS | Status: AC
  Administered 2019-07-14: 09:00:00 700 mg via INTRAVENOUS
  Filled 2019-07-14: qty 20

## 2019-07-14 NOTE — Discharge Instructions (Signed)
COVID-19: How to Protect Yourself and Others Know how it spreads  There is currently no vaccine to prevent coronavirus disease 2019 (COVID-19).  The best way to prevent illness is to avoid being exposed to this virus.  The virus is thought to spread mainly from person-to-person. ? Between people who are in close contact with one another (within about 6 feet). ? Through respiratory droplets produced when an infected person coughs, sneezes or talks. ? These droplets can land in the mouths or noses of people who are nearby or possibly be inhaled into the lungs. ? Some recent studies have suggested that COVID-19 may be spread by people who are not showing symptoms. Everyone should Clean your hands often  Wash your hands often with soap and water for at least 20 seconds especially after you have been in a public place, or after blowing your nose, coughing, or sneezing.  If soap and water are not readily available, use a hand sanitizer that contains at least 60% alcohol. Cover all surfaces of your hands and rub them together until they feel dry.  Avoid touching your eyes, nose, and mouth with unwashed hands. Avoid close contact  Stay home if you are sick.  Avoid close contact with people who are sick.  Put distance between yourself and other people. ? Remember that some people without symptoms may be able to spread virus. ? This is especially important for people who are at higher risk of getting very sick.www.cdc.gov/coronavirus/2019-ncov/need-extra-precautions/people-at-higher-risk.html Cover your mouth and nose with a cloth face cover when around others  You could spread COVID-19 to others even if you do not feel sick.  Everyone should wear a cloth face cover when they have to go out in public, for example to the grocery store or to pick up other necessities. ? Cloth face coverings should not be placed on young children under age 2, anyone who has trouble breathing, or is unconscious,  incapacitated or otherwise unable to remove the mask without assistance.  The cloth face cover is meant to protect other people in case you are infected.  Do NOT use a facemask meant for a healthcare worker.  Continue to keep about 6 feet between yourself and others. The cloth face cover is not a substitute for social distancing. Cover coughs and sneezes  If you are in a private setting and do not have on your cloth face covering, remember to always cover your mouth and nose with a tissue when you cough or sneeze or use the inside of your elbow.  Throw used tissues in the trash.  Immediately wash your hands with soap and water for at least 20 seconds. If soap and water are not readily available, clean your hands with a hand sanitizer that contains at least 60% alcohol. Clean and disinfect  Clean AND disinfect frequently touched surfaces daily. This includes tables, doorknobs, light switches, countertops, handles, desks, phones, keyboards, toilets, faucets, and sinks. www.cdc.gov/coronavirus/2019-ncov/prevent-getting-sick/disinfecting-your-home.html  If surfaces are dirty, clean them: Use detergent or soap and water prior to disinfection.  Then, use a household disinfectant. You can see a list of EPA-registered household disinfectants here. cdc.gov/coronavirus 11/24/2018 This information is not intended to replace advice given to you by your health care provider. Make sure you discuss any questions you have with your health care provider. Document Released: 11/03/2018 Document Revised: 12/02/2018 Document Reviewed: 11/03/2018 Elsevier Patient Education  2020 Elsevier Inc.  

## 2019-07-14 NOTE — Progress Notes (Signed)
  Diagnosis: COVID-19  Physician: Dr. Marisue Humble  Procedure: [facility] Med: bamlanivimab infusion - Provided patient with bamlanimivab fact sheet for patients, parents and caregivers prior to infusion.  Complications: No immediate complications noted.  Discharge: Discharged home   [name] 07/14/2019

## 2019-07-19 ENCOUNTER — Emergency Department (HOSPITAL_COMMUNITY): Payer: Medicare Other

## 2019-07-19 ENCOUNTER — Encounter (HOSPITAL_COMMUNITY): Payer: Self-pay

## 2019-07-19 ENCOUNTER — Inpatient Hospital Stay (HOSPITAL_COMMUNITY)
Admission: EM | Admit: 2019-07-19 | Discharge: 2019-07-24 | DRG: 690 | Disposition: A | Discharge status: Home / Self Care | Payer: Medicare Other | Attending: Internal Medicine | Admitting: Internal Medicine

## 2019-07-19 ENCOUNTER — Other Ambulatory Visit: Payer: Self-pay

## 2019-07-19 DIAGNOSIS — M810 Age-related osteoporosis without current pathological fracture: Secondary | ICD-10-CM | POA: Diagnosis present

## 2019-07-19 DIAGNOSIS — Z91010 Allergy to peanuts: Secondary | ICD-10-CM

## 2019-07-19 DIAGNOSIS — R05 Cough: Secondary | ICD-10-CM

## 2019-07-19 DIAGNOSIS — I129 Hypertensive chronic kidney disease with stage 1 through stage 4 chronic kidney disease, or unspecified chronic kidney disease: Secondary | ICD-10-CM | POA: Diagnosis present

## 2019-07-19 DIAGNOSIS — Z7901 Long term (current) use of anticoagulants: Secondary | ICD-10-CM

## 2019-07-19 DIAGNOSIS — I482 Chronic atrial fibrillation, unspecified: Secondary | ICD-10-CM | POA: Diagnosis present

## 2019-07-19 DIAGNOSIS — Z91040 Latex allergy status: Secondary | ICD-10-CM

## 2019-07-19 DIAGNOSIS — Z880 Allergy status to penicillin: Secondary | ICD-10-CM

## 2019-07-19 DIAGNOSIS — I160 Hypertensive urgency: Secondary | ICD-10-CM | POA: Diagnosis present

## 2019-07-19 DIAGNOSIS — D472 Monoclonal gammopathy: Secondary | ICD-10-CM | POA: Diagnosis present

## 2019-07-19 DIAGNOSIS — N183 Chronic kidney disease, stage 3 unspecified: Secondary | ICD-10-CM | POA: Diagnosis present

## 2019-07-19 DIAGNOSIS — Z882 Allergy status to sulfonamides status: Secondary | ICD-10-CM

## 2019-07-19 DIAGNOSIS — Z886 Allergy status to analgesic agent status: Secondary | ICD-10-CM

## 2019-07-19 DIAGNOSIS — F0390 Unspecified dementia without behavioral disturbance: Secondary | ICD-10-CM | POA: Diagnosis present

## 2019-07-19 DIAGNOSIS — I480 Paroxysmal atrial fibrillation: Secondary | ICD-10-CM | POA: Diagnosis present

## 2019-07-19 DIAGNOSIS — Z823 Family history of stroke: Secondary | ICD-10-CM

## 2019-07-19 DIAGNOSIS — R001 Bradycardia, unspecified: Secondary | ICD-10-CM | POA: Diagnosis present

## 2019-07-19 DIAGNOSIS — E876 Hypokalemia: Secondary | ICD-10-CM

## 2019-07-19 DIAGNOSIS — U071 COVID-19: Secondary | ICD-10-CM | POA: Diagnosis not present

## 2019-07-19 DIAGNOSIS — Z8616 Personal history of COVID-19: Secondary | ICD-10-CM | POA: Diagnosis present

## 2019-07-19 DIAGNOSIS — R059 Cough, unspecified: Secondary | ICD-10-CM

## 2019-07-19 DIAGNOSIS — J449 Chronic obstructive pulmonary disease, unspecified: Secondary | ICD-10-CM | POA: Diagnosis present

## 2019-07-19 DIAGNOSIS — R9431 Abnormal electrocardiogram [ECG] [EKG]: Secondary | ICD-10-CM | POA: Diagnosis present

## 2019-07-19 DIAGNOSIS — N390 Urinary tract infection, site not specified: Principal | ICD-10-CM | POA: Diagnosis present

## 2019-07-19 DIAGNOSIS — K219 Gastro-esophageal reflux disease without esophagitis: Secondary | ICD-10-CM | POA: Diagnosis present

## 2019-07-19 DIAGNOSIS — Z79899 Other long term (current) drug therapy: Secondary | ICD-10-CM

## 2019-07-19 DIAGNOSIS — R0902 Hypoxemia: Secondary | ICD-10-CM | POA: Diagnosis present

## 2019-07-19 DIAGNOSIS — D631 Anemia in chronic kidney disease: Secondary | ICD-10-CM | POA: Diagnosis present

## 2019-07-19 DIAGNOSIS — Z888 Allergy status to other drugs, medicaments and biological substances status: Secondary | ICD-10-CM

## 2019-07-19 DIAGNOSIS — D539 Nutritional anemia, unspecified: Secondary | ICD-10-CM | POA: Diagnosis present

## 2019-07-19 LAB — BASIC METABOLIC PANEL
Anion gap: 12 (ref 5–15)
BUN: 37 mg/dL — ABNORMAL HIGH (ref 8–23)
CO2: 29 mmol/L (ref 22–32)
Calcium: 8.9 mg/dL (ref 8.9–10.3)
Chloride: 98 mmol/L (ref 98–111)
Creatinine, Ser: 1.35 mg/dL — ABNORMAL HIGH (ref 0.44–1.00)
GFR calc Af Amer: 40 mL/min — ABNORMAL LOW (ref >60)
GFR calc non Af Amer: 34 mL/min — ABNORMAL LOW (ref >60)
Glucose, Bld: 82 mg/dL (ref 70–99)
Potassium: 2.7 mmol/L — CL (ref 3.5–5.1)
Sodium: 139 mmol/L (ref 135–145)

## 2019-07-19 LAB — CBC WITH DIFFERENTIAL/PLATELET
Abs Immature Granulocytes: 0.02 10*3/uL (ref 0.00–0.07)
Basophils Absolute: 0 10*3/uL (ref 0.0–0.1)
Basophils Relative: 0 %
Eosinophils Absolute: 0 10*3/uL (ref 0.0–0.5)
Eosinophils Relative: 0 %
HCT: 33 % — ABNORMAL LOW (ref 36.0–46.0)
Hemoglobin: 11 g/dL — ABNORMAL LOW (ref 12.0–15.0)
Immature Granulocytes: 0 %
Lymphocytes Relative: 19 %
Lymphs Abs: 1.1 10*3/uL (ref 0.7–4.0)
MCH: 32.2 pg (ref 26.0–34.0)
MCHC: 33.3 g/dL (ref 30.0–36.0)
MCV: 96.5 fL (ref 80.0–100.0)
Monocytes Absolute: 0.4 10*3/uL (ref 0.1–1.0)
Monocytes Relative: 7 %
Neutro Abs: 4.2 10*3/uL (ref 1.7–7.7)
Neutrophils Relative %: 74 %
Platelets: 171 10*3/uL (ref 150–400)
RBC: 3.42 MIL/uL — ABNORMAL LOW (ref 3.87–5.11)
RDW: 13.1 % (ref 11.5–15.5)
WBC: 5.8 10*3/uL (ref 4.0–10.5)
nRBC: 0 % (ref 0.0–0.2)

## 2019-07-19 LAB — COMPREHENSIVE METABOLIC PANEL
ALT: 17 U/L (ref 0–44)
AST: 21 U/L (ref 15–41)
Albumin: 3.1 g/dL — ABNORMAL LOW (ref 3.5–5.0)
Alkaline Phosphatase: 54 U/L (ref 38–126)
Anion gap: 12 (ref 5–15)
BUN: 39 mg/dL — ABNORMAL HIGH (ref 8–23)
CO2: 29 mmol/L (ref 22–32)
Calcium: 9.3 mg/dL (ref 8.9–10.3)
Chloride: 97 mmol/L — ABNORMAL LOW (ref 98–111)
Creatinine, Ser: 1.4 mg/dL — ABNORMAL HIGH (ref 0.44–1.00)
GFR calc Af Amer: 38 mL/min — ABNORMAL LOW (ref >60)
GFR calc non Af Amer: 33 mL/min — ABNORMAL LOW (ref >60)
Glucose, Bld: 91 mg/dL (ref 70–99)
Potassium: 2.3 mmol/L — CL (ref 3.5–5.1)
Sodium: 138 mmol/L (ref 135–145)
Total Bilirubin: 1.3 mg/dL — ABNORMAL HIGH (ref 0.3–1.2)
Total Protein: 6.5 g/dL (ref 6.5–8.1)

## 2019-07-19 LAB — URINALYSIS, ROUTINE W REFLEX MICROSCOPIC
Bilirubin Urine: NEGATIVE
Glucose, UA: NEGATIVE mg/dL
Ketones, ur: 5 mg/dL — AB
Nitrite: NEGATIVE
Protein, ur: 30 mg/dL — AB
Specific Gravity, Urine: 1.015 (ref 1.005–1.030)
WBC, UA: >50 WBC/hpf — ABNORMAL HIGH (ref 0–5)
pH: 5 (ref 5.0–8.0)

## 2019-07-19 LAB — LIPASE, BLOOD: Lipase: 29 U/L (ref 11–51)

## 2019-07-19 LAB — LACTIC ACID, PLASMA
Lactic Acid, Venous: 1.4 mmol/L (ref 0.5–1.9)
Lactic Acid, Venous: 1.2 mmol/L (ref 0.5–1.9)

## 2019-07-19 LAB — POC SARS CORONAVIRUS 2 AG -  ED: SARS Coronavirus 2 Ag: POSITIVE — AB

## 2019-07-19 LAB — TSH: TSH: 0.638 u[IU]/mL (ref 0.350–4.500)

## 2019-07-19 LAB — MAGNESIUM: Magnesium: 1.3 mg/dL — ABNORMAL LOW (ref 1.7–2.4)

## 2019-07-19 MED ORDER — APIXABAN 2.5 MG PO TABS
2.5000 mg | ORAL_TABLET | Freq: Two times a day (BID) | ORAL | Status: DC
  Administered 2019-07-19 – 2019-07-24 (×10): 2.5 mg via ORAL
  Filled 2019-07-19 (×13): qty 1

## 2019-07-19 MED ORDER — SERTRALINE HCL 50 MG PO TABS
100.0000 mg | ORAL_TABLET | Freq: Every day | ORAL | Status: DC
  Administered 2019-07-20 – 2019-07-24 (×5): 100 mg via ORAL
  Filled 2019-07-19: qty 1
  Filled 2019-07-19 (×4): qty 2

## 2019-07-19 MED ORDER — FLUTICASONE PROPIONATE 50 MCG/ACT NA SUSP
2.0000 | Freq: Every day | NASAL | Status: DC | PRN
  Filled 2019-07-19: qty 16

## 2019-07-19 MED ORDER — SODIUM CHLORIDE 0.9 % IV SOLN: INTRAVENOUS | Status: DC

## 2019-07-19 MED ORDER — TRAZODONE HCL 50 MG PO TABS
50.0000 mg | ORAL_TABLET | Freq: Every day | ORAL | Status: DC
  Administered 2019-07-19 – 2019-07-23 (×5): 50 mg via ORAL
  Filled 2019-07-19 (×5): qty 1

## 2019-07-19 MED ORDER — MECLIZINE HCL 12.5 MG PO TABS
12.5000 mg | ORAL_TABLET | Freq: Three times a day (TID) | ORAL | Status: DC | PRN
  Administered 2019-07-24: 11:00:00 12.5 mg via ORAL
  Filled 2019-07-19 (×2): qty 1

## 2019-07-19 MED ORDER — POTASSIUM CHLORIDE CRYS ER 20 MEQ PO TBCR
40.0000 meq | EXTENDED_RELEASE_TABLET | Freq: Once | ORAL | Status: AC
  Administered 2019-07-19: 40 meq via ORAL
  Filled 2019-07-19: qty 2

## 2019-07-19 MED ORDER — AMIODARONE HCL 100 MG PO TABS
100.0000 mg | ORAL_TABLET | Freq: Every day | ORAL | Status: DC
  Administered 2019-07-19 – 2019-07-24 (×5): 100 mg via ORAL
  Filled 2019-07-19 (×6): qty 1

## 2019-07-19 MED ORDER — BENZONATATE 100 MG PO CAPS
200.0000 mg | ORAL_CAPSULE | Freq: Three times a day (TID) | ORAL | Status: DC
  Administered 2019-07-19 – 2019-07-22 (×8): 200 mg via ORAL
  Filled 2019-07-19 (×8): qty 2

## 2019-07-19 MED ORDER — PROPRANOLOL HCL ER 60 MG PO CP24
120.0000 mg | ORAL_CAPSULE | Freq: Two times a day (BID) | ORAL | Status: DC
  Administered 2019-07-19: 120 mg via ORAL
  Filled 2019-07-19 (×2): qty 2
  Filled 2019-07-19: qty 1

## 2019-07-19 MED ORDER — DONEPEZIL HCL 10 MG PO TABS
10.0000 mg | ORAL_TABLET | Freq: Every day | ORAL | Status: DC
  Administered 2019-07-19 – 2019-07-23 (×5): 10 mg via ORAL
  Filled 2019-07-19 (×6): qty 1

## 2019-07-19 MED ORDER — DM-GUAIFENESIN ER 30-600 MG PO TB12: 1.0000 | ORAL_TABLET | Freq: Every day | ORAL | Status: DC | PRN

## 2019-07-19 MED ORDER — POTASSIUM CHLORIDE 10 MEQ/100ML IV SOLN
10.0000 meq | INTRAVENOUS | Status: AC
  Administered 2019-07-19 (×3): 10 meq via INTRAVENOUS
  Filled 2019-07-19 (×3): qty 100

## 2019-07-19 NOTE — ED Notes (Signed)
Pt being monitored on RA to determine oxygen trend, per Dr. Sherry Ruffing. Pt currently 94% on RA.

## 2019-07-19 NOTE — ED Notes (Signed)
This RN attempted to start an IV on pt twice.

## 2019-07-19 NOTE — ED Provider Notes (Signed)
[facility] EMERGENCY DEPARTMENT Provider Note   CSN: [id number] Arrival date & time: 07/19/19  1219     History Chief Complaint  Patient presents with  . Weakness    [name] is a 85 y.o. female.  The history is provided by the patient and medical records. No language interpreter was used.  Weakness Severity:  Severe (fatigue) Onset quality:  Gradual Timing:  Constant Progression:  Worsening Chronicity:  New Relieved by:  Nothing Worsened by:  Nothing Associated symptoms: cough, diarrhea, nausea and vomiting   Associated symptoms: no abdominal pain, no chest pain, no dizziness, no dysuria, no falls, no fever, no frequency, no headaches, no shortness of breath and no syncope        Past Medical History:  Diagnosis Date  . Allergy   . Cancer ([place])    follicular lymphoma  . Chronic atrial fibrillation ([place]) 11/18/2017  . Chronic kidney disease   . COPD (chronic obstructive pulmonary disease) ([place])   . Depression   . DJD (degenerative joint disease)   . Dysuria 09/25/2015  . Essential tremor   . GERD (gastroesophageal reflux disease)   . Heart murmur    had echo 20 yr ago-not sure where  . Hx of colonic polyp 10/02/05  . Hypertension   . MGUS (monoclonal gammopathy of unknown significance) 10/24/2015  . MVP (mitral valve prolapse)   . Orthostatic hypotension   . Osteoporosis   . Paroxysmal atrial fibrillation ([facility])   . Venous insufficiency     Patient Active Problem List   Diagnosis Date Noted  . Memory loss 02/18/2019  . Orthostatic hypotension 09/01/2018  . Hypokalemia 05/19/2018  . Deficiency anemia 11/18/2017  . Pancytopenia, acquired ([place]) 05/13/2017  . Hypertension 01/30/2017  . GERD (gastroesophageal reflux disease) 01/30/2017  . Monoclonal gammopathy of unknown significance (MGUS) 01/30/2017  . Chest pain 01/30/2017  . Follicular (B cell) lymphoma ([place]) 01/30/2017  . Peripheral neuropathy 01/30/2017  . Essential tremor  01/30/2017  . CKD (chronic kidney disease) stage 3, GFR 30-59 ml/min ([facility]) 01/30/2017  . Depression 01/30/2017  . DJD (degenerative joint disease) 01/30/2017  . COPD (chronic obstructive pulmonary disease) ([place]) 01/30/2017  . 32mm right ?? Lung nodule seen on imaging study 01/30/2017  . Hypomagnesemia 01/30/2017  . Elevated troponin   . Full body hives 05/02/2016  . MGUS (monoclonal gammopathy of unknown significance) 10/24/2015  . Intermittent vertigo 09/26/2014  . Systolic hypertension 93/71/6967  . History of B-cell lymphoma 12/11/2011  . Lymphadenopathy 10/25/2011    Past Surgical History:  Procedure Laterality Date  . APPENDECTOMY    . CATARACT EXTRACTION Bilateral   . COLONOSCOPY    . DILATION AND CURETTAGE OF UTERUS     several  . OVARIAN CYST REMOVAL    . SKIN BIOPSY Right 03/09/2019   FACE AND LEG     OB History   No obstetric history on file.     Family History  Problem Relation Age of Onset  . Heart murmur Mother   . Stroke Mother 43  . Stroke Father 28    Social History   Tobacco Use  . Smoking status: Never Smoker  . Smokeless tobacco: Never Used  Substance Use Topics  . Alcohol use: No    Alcohol/week: 0.0 standard drinks  . Drug use: No    Home Medications Prior to Admission medications   Medication Sig Start Date End Date Taking? Authorizing Provider  acetaminophen (TYLENOL) 500 MG tablet Take 1-2 tablets (500-1,000  mg total) by mouth every 6 (six) hours as needed for moderate pain. 09/02/18   Duffy Bruce, MD  amiodarone (PACERONE) 200 MG tablet TAKE 1/2 TABLET BY MOUTH ONCE DAILY 02/24/19   Miquel Dunn, NP  apixaban (ELIQUIS) 2.5 MG TABS tablet Take 1 tablet (2.5 mg total) by mouth 2 (two) times daily. 02/01/17   Oswald Hillock, MD  clindamycin (CLEOCIN) 150 MG capsule Take 4 capsules by mouth as needed. As needed for dental procedures 03/16/18   [provider]  dextromethorphan-guaiFENesin (MUCINEX DM) 30-600 MG per 12 hr  tablet Take 1 tablet by mouth daily as needed (sinus).     [provider]  donepezil (ARICEPT) 10 MG tablet Take 1 tablet (10 mg total) by mouth at bedtime. 06/28/19   Tat, Eustace Quail, DO  famotidine (PEPCID) 20 MG tablet TK 1 T PO BID FOR STOMACH ACID 11/18/18   [provider]  fluticasone (FLONASE) 50 MCG/ACT nasal spray Place 2 sprays into the nose daily as needed for allergies.     [provider]  Glycerin-Hypromellose-PEG 400 ([employer] DRY EYE RELIEF) 0.2-0.2-1 % SOLN Place 1 drop into both eyes as needed (dry eye).    [provider]  hydrochlorothiazide (MICROZIDE) 12.5 MG capsule Take 1 capsule (12.5 mg total) by mouth daily. Patient taking differently: Take 25 mg by mouth daily.  02/02/17   Oswald Hillock, MD  hyoscyamine (LEVSIN SL) 0.125 MG SL tablet Place 0.125 mg under the tongue every 4 (four) hours as needed for cramping. Reported on 09/25/2015    [provider]  loperamide (IMODIUM A-D) 2 MG tablet Take 2 mg by mouth 4 (four) times daily as needed for diarrhea or loose stools.    [provider]  losartan (COZAAR) 100 MG tablet Take 100 mg by mouth daily.    [provider]  meclizine (ANTIVERT) 50 MG tablet Take 0.5 tablets (25 mg total) by mouth 3 (three) times daily as needed. 06/07/14   Pamella Pert, MD  propranolol ER (INDERAL LA) 120 MG 24 hr capsule Take 1 capsule (120 mg total) by mouth 2 (two) times daily. 06/24/19   Miquel Dunn, NP  sertraline (ZOLOFT) 100 MG tablet Take 100 mg by mouth daily.     [provider]  traZODone (DESYREL) 50 MG tablet TK ONE-HALF TO 1 T PO BEFORE BED PRF SLEEP PROBLEMS 11/18/18   [provider]    Allergies    Ibuprofen, Latex, Peanut-containing drug products, Strawberry c [ascorbate], Sulindac, Amoxicillin, Aspirin, Benadryl [diphenhydramine hcl], Penicillins, and Sulfa antibiotics  Review of Systems   Review of Systems  Constitutional: Positive for  fatigue. Negative for chills, diaphoresis and fever.  HENT: Negative for congestion.   Eyes: Negative for visual disturbance.  Respiratory: Positive for cough. Negative for chest tightness, shortness of breath and wheezing.   Cardiovascular: Negative for chest pain, palpitations, leg swelling and syncope.  Gastrointestinal: Positive for diarrhea, nausea and vomiting. Negative for abdominal pain and constipation.  Genitourinary: Negative for dysuria and frequency.  Musculoskeletal: Negative for back pain, falls, neck pain and neck stiffness.  Skin: Negative for rash and wound.  Neurological: Positive for weakness. Negative for dizziness, light-headedness, numbness and headaches.  Psychiatric/Behavioral: Negative for agitation.  All other systems reviewed and are negative.   Physical Exam Updated Vital Signs BP (!) 172/74   Pulse (!) 54   Temp 98.2 F (36.8 C) (Oral)   Resp 13   Ht 5\' 3"  (1.6 m)  Wt 40.8 kg   SpO2 99%   BMI 15.94 kg/m   Physical Exam Vitals and nursing note reviewed.  Constitutional:      General: She is not in acute distress.    Appearance: She is well-developed. She is not ill-appearing, toxic-appearing or diaphoretic.  HENT:     Head: Normocephalic and atraumatic.     Right Ear: External ear normal.     Left Ear: External ear normal.     Nose: Nose normal.     Mouth/Throat:     Mouth: Mucous membranes are moist.     Pharynx: No oropharyngeal exudate or posterior oropharyngeal erythema.  Eyes:     Conjunctiva/sclera: Conjunctivae normal.     Pupils: Pupils are equal, round, and reactive to light.  Cardiovascular:     Rate and Rhythm: Normal rate.     Pulses: Normal pulses.  Pulmonary:     Effort: Pulmonary effort is normal. No respiratory distress.     Breath sounds: No stridor. No wheezing, rhonchi or rales.  Chest:     Chest wall: No tenderness.  Abdominal:     General: Abdomen is flat. There is no distension.     Tenderness: There is no  abdominal tenderness. There is no right CVA tenderness, left CVA tenderness or rebound.  Musculoskeletal:        General: No tenderness.     Cervical back: Normal range of motion and neck supple. No tenderness.  Skin:    General: Skin is warm.     Capillary Refill: Capillary refill takes less than 2 seconds.     Findings: No erythema or rash.  Neurological:     General: No focal deficit present.     Mental Status: She is alert and oriented to person, place, and time.     Sensory: No sensory deficit.     Motor: No weakness or abnormal muscle tone.     Deep Tendon Reflexes: Reflexes are normal and symmetric.  Psychiatric:        Mood and Affect: Mood normal.     ED Results / Procedures / Treatments   Labs (all labs ordered are listed, but only abnormal results are displayed) Labs Reviewed  CBC WITH DIFFERENTIAL/PLATELET - Abnormal; Notable for the following components:      Result Value   RBC 3.42 (*)    Hemoglobin 11.0 (*)    HCT 33.0 (*)    All other components within normal limits  COMPREHENSIVE METABOLIC PANEL - Abnormal; Notable for the following components:   Potassium 2.3 (*)    Chloride 97 (*)    BUN 39 (*)    Creatinine, Ser 1.40 (*)    Albumin 3.1 (*)    Total Bilirubin 1.3 (*)    GFR calc non Af Amer 33 (*)    GFR calc Af Amer 38 (*)    All other components within normal limits  URINE CULTURE  LACTIC ACID, PLASMA  LIPASE, BLOOD  TSH  LACTIC ACID, PLASMA  URINALYSIS, ROUTINE W REFLEX MICROSCOPIC  BASIC METABOLIC PANEL  BASIC METABOLIC PANEL  MAGNESIUM  POC SARS CORONAVIRUS 2 AG -  ED    EKG EKG Interpretation  Date/Time:  Monday July 19 2019 12:34:28 EST Ventricular Rate:  55 PR Interval:    QRS Duration: 94 QT Interval:  664 QTC Calculation: 636 R Axis:   -65 Text Interpretation: Sinus or ectopic atrial rhythm Short PR interval Left anterior fascicular block Abnormal R-wave progression, late transition Probable LVH  with secondary repol abnrm  Prolonged QT interval when compared to prior, longer QTc. No STEMI Confirmed by Antony Blackbird [phone] on 07/19/2019 1:12:59 PM   Radiology DG Chest Portable 1 View  Result Date: 07/19/2019 CLINICAL DATA:  Cough. EXAM: PORTABLE CHEST 1 VIEW COMPARISON:  September 02, 2018. FINDINGS: The heart size and mediastinal contours are within normal limits. Both lungs are clear. The visualized skeletal structures are unremarkable. IMPRESSION: No active disease. Aortic Atherosclerosis (ICD10-I70.0). Electronically Signed   By: Marijo Conception M.D.   On: 07/19/2019 14:09    Procedures Procedures (including critical care time)  [name] was evaluated in Emergency Department on 07/19/2019 for the symptoms described in the history of present illness. She was evaluated in the context of the global COVID-19 pandemic, which necessitated consideration that the patient might be at risk for infection with the SARS-CoV-2 virus that causes COVID-19. Institutional protocols and algorithms that pertain to the evaluation of patients at risk for COVID-19 are in a state of rapid change based on information released by regulatory bodies including the CDC and federal and state organizations. These policies and algorithms were followed during the patient's care in the ED.   Medications Ordered in ED Medications  potassium chloride 10 mEq in 100 mL IVPB (10 mEq Intravenous New Bag/Given 07/19/19 1718)  0.9 %  sodium chloride infusion (has no administration in time range)  potassium chloride SA (KLOR-CON) CR tablet 40 mEq (40 mEq Oral Given 07/19/19 1524)    ED Course  I have reviewed the triage vital signs and the nursing notes.  Pertinent labs & imaging results that were available during my care of the patient were reviewed by me and considered in my medical decision making (see chart for details).    MDM Rules/Calculators/A&P                      [name] is a 85 y.o. female with a past medical  history significant for atrial fibrillation on Eliquis, hypertension, prior MGUS, chronic kidney disease, COPD, and recent diagnosis of COVID-19 infection who presents with nausea, vomiting/dry heaving, severe fatigue, and cough.  Patient was sent in by family to "get the cough checked out" according to patient.  Patient says that she has been very tired for the last few days and is not eating or drinking well.  She is having nausea and vomiting.  She reports he chronically has some abnormal stool problems with her irritable bowel syndrome that is unchanged.  She reports no pain at this time with no chest pain, or abdominal pain.  She denies any syncopal episodes or lightheadedness.  She does report feeling very fatigued and has not been eating or drinking.  She denies any urinary changes.  She reports she has a dry cough but no crushing chest pain.  She was diagnosed with Covid last week.  Chart view shows that she has had some infusion since then.  On exam, she does have rhonchi in her breath sounds.  Her oxygen saturation was around 93% on room air when I was seeing the patient.  She was initially put on 2 L but I discontinued it to see what her oxygen does.  We will ambulate her with pulse ox to see if she gets hypoxic.  Her abdomen and chest were nontender.  She had a slight murmur.  She was moving all extremities.  Normal sensation throughout.  Normal strength throughout.  No significant edema seen.  She was resting comfortably on my exam.  Clinically I suspect she is having symptoms from her COVID-19 infection however with the reported nausea vomiting and decreased oral intake, will look for AKI or electrolyte imbalance.  We will also get chest x-ray to look for development of pneumonia with her continued cough and fatigue.  Will check urine.  Anticipate reassessment after work-up and if patient is hypoxic, she will likely also need admission.  EKG shows prolonged QTC and possible U wave.  Clinically I  am concerned about hypokalemia.  3:04 PM Work-up again to return.  Patient was found to have low potassium of 2.3.  She will be given IV and oral potassium.  She also was ambulated in the room and was found to have an oxygen saturation of 88%.  This also speaks to her needing admission and will place her on oxygen.    We will call unassigned team for admission for the low potassium, EKG abnormalities, and hypoxia with COVID-19 infection with dry heaving and nausea.     Final Clinical Impression(s) / ED Diagnoses Final diagnoses:  COVID-19  Hypoxia  Hypokalemia    Rx / DC Orders ED Discharge Orders    None     Clinical Impression: 1. COVID-19   2. Hypoxia   3. Hypokalemia     Disposition: Admit  This note was prepared with assistance of Dragon voice recognition software. Occasional wrong-word or sound-a-like substitutions may have occurred due to the inherent limitations of voice recognition software. ]   Fronie Holstein, Gwenyth Allegra, MD 07/19/19 [phone]

## 2019-07-19 NOTE — H&P (Addendum)
History and Physical    [name] INO:[id number] DOB: 06-Jan-1926 DOA: 07/19/2019  PCP: Gaynelle Arabian, MD   Patient coming from: Home.   I have personally briefly reviewed patient's old medical records in [place]  Chief Complaint: not feeling good and dry heaving.   HPI: [name] is a 85 y.o. female with medical history significant of recent covid positive illness on 12/17 s/p  Bamlanivimab infusion, chronic atrial fibrillation, COPD, GERD, depression, dementia, was brought in for generalized weakness, decreased appetite. Pt is alert but was confused . She was able to tell me that she didn't have taste and couldn't eat anything. She denies any chest pain , sob, nausea, vomiting or abd pain. No headache fever or chills. No dysuria. She was referred to [facility] for admission for hypokalemia and prolonged QTC.   ED Course: on arrival to ED, she was afebrile, hypertensive, sats were 94 % on RA, labs revealed sodium of 138, potassium of 2.3, lactic acid of 1.4. and hemoglobin of 11. CXR was negative for pneumonia.  Repeat COVID test in ED pending.   Review of Systems: unable to be obtain detailed ROS due to dementia and slight confusion . See HPI .   Past Medical History:  Diagnosis Date  . Allergy   . Cancer ([place])    follicular lymphoma  . Chronic atrial fibrillation ([place]) 11/18/2017  . Chronic kidney disease   . COPD (chronic obstructive pulmonary disease) ([place])   . Depression   . DJD (degenerative joint disease)   . Dysuria 09/25/2015  . Essential tremor   . GERD (gastroesophageal reflux disease)   . Heart murmur    had echo 20 yr ago-not sure where  . Hx of colonic polyp 10/02/05  . Hypertension   . MGUS (monoclonal gammopathy of unknown significance) 10/24/2015  . MVP (mitral valve prolapse)   . Orthostatic hypotension   . Osteoporosis   . Paroxysmal atrial fibrillation (HCC)   . Venous insufficiency     Past Surgical History:  Procedure Laterality Date  .  APPENDECTOMY    . CATARACT EXTRACTION Bilateral   . COLONOSCOPY    . DILATION AND CURETTAGE OF UTERUS     several  . OVARIAN CYST REMOVAL    . SKIN BIOPSY Right 03/09/2019   FACE AND LEG   Social history.   reports that she has never smoked. She has never used smokeless tobacco. She reports that she does not drink alcohol or use drugs.  Allergies  Allergen Reactions  . Ibuprofen Nausea And Vomiting and Palpitations  . Latex Hives  . Peanut-Containing Drug Products Itching and Swelling    Lips   . Strawberry C [Ascorbate]     Heartburn   . Sulindac     Ineffective for arthritis  . Amoxicillin Diarrhea and Nausea And Vomiting  . Aspirin Nausea And Vomiting and Other (See Comments)    Heartburn,  Indigestion.  . Benadryl [Diphenhydramine Hcl] Nausea And Vomiting  . Penicillins Rash    Did it involve swelling of the face/tongue/throat, SOB, or low BP? Yes Did it involve sudden or severe rash/hives, skin peeling, or any reaction on the inside of your mouth or nose? No Did you need to seek medical attention at a hospital or doctor's office? NO When did it last happen?20 years ago If all above answers are "NO", may proceed with cephalosporin use.   . Sulfa Antibiotics Itching and Nausea And Vomiting    Family History  Problem  Relation Age of Onset  . Heart murmur Mother   . Stroke Mother 77  . Stroke Father 34   Family history reviewed .   Prior to Admission medications   Medication Sig Start Date End Date Taking? Authorizing Provider  acetaminophen (TYLENOL) 500 MG tablet Take 1-2 tablets (500-1,000 mg total) by mouth every 6 (six) hours as needed for moderate pain. 09/02/18  Yes Duffy Bruce, MD  amiodarone (PACERONE) 200 MG tablet TAKE 1/2 TABLET BY MOUTH ONCE DAILY Patient taking differently: Take 100 mg by mouth daily.  02/24/19  Yes Miquel Dunn, NP  apixaban (ELIQUIS) 2.5 MG TABS tablet Take 1 tablet (2.5 mg total) by mouth 2 (two) times daily. 02/01/17  Yes  Oswald Hillock, MD  benzonatate (TESSALON) 100 MG capsule Take 200 mg by mouth 3 (three) times daily. 07/15/19  Yes [provider]  dextromethorphan-guaiFENesin (MUCINEX DM) 30-600 MG per 12 hr tablet Take 1 tablet by mouth daily as needed (sinus).    Yes [provider]  donepezil (ARICEPT) 10 MG tablet Take 1 tablet (10 mg total) by mouth at bedtime. 06/28/19  Yes Tat, Eustace Quail, DO  famotidine (PEPCID) 20 MG tablet Take 20 mg by mouth daily as needed for heartburn or indigestion.  11/18/18  Yes [provider]  fluticasone (FLONASE) 50 MCG/ACT nasal spray Place 2 sprays into the nose daily as needed for allergies.    Yes [provider]  Glycerin-Hypromellose-PEG 400 ([employer] DRY EYE RELIEF) 0.2-0.2-1 % SOLN Place 1 drop into both eyes as needed (dry eye).   Yes [provider]  hydrochlorothiazide (MICROZIDE) 12.5 MG capsule Take 1 capsule (12.5 mg total) by mouth daily. Patient taking differently: Take 25 mg by mouth daily.  02/02/17  Yes Lama, Marge Duncans, MD  hyoscyamine (LEVSIN SL) 0.125 MG SL tablet Place 0.375 mg under the tongue 2 (two) times daily.    Yes [provider]  loperamide (IMODIUM A-D) 2 MG tablet Take 2 mg by mouth 4 (four) times daily as needed for diarrhea or loose stools.   Yes [provider]  losartan (COZAAR) 100 MG tablet Take 100 mg by mouth daily.   Yes [provider]  meclizine (ANTIVERT) 50 MG tablet Take 0.5 tablets (25 mg total) by mouth 3 (three) times daily as needed. Patient taking differently: Take 12.5 mg by mouth 3 (three) times daily as needed for dizziness.  06/07/14  Yes Pamella Pert, MD  propranolol ER (INDERAL LA) 120 MG 24 hr capsule Take 1 capsule (120 mg total) by mouth 2 (two) times daily. 06/24/19  Yes Miquel Dunn, NP  sertraline (ZOLOFT) 100 MG tablet Take 100 mg by mouth daily.    Yes [provider]  traZODone (DESYREL) 50 MG tablet Take 50 mg by mouth at  bedtime.  11/18/18  Yes [provider]  clindamycin (CLEOCIN) 150 MG capsule Take 4 capsules by mouth as needed. As needed for dental procedures 03/16/18   [provider]    Physical Exam: Vitals:   07/19/19 1315 07/19/19 1330 07/19/19 1453 07/19/19 1602  BP: (!) 182/69 (!) 184/69 (!) 186/78 (!) 199/79  Pulse: (!) 50 (!) 50 (!) 50 (!) 50  Resp: 12 12 15 19   Temp:      TempSrc:      SpO2: 99% 98% 93% 100%  Weight:      Height:        Constitutional: NAD, calm, comfortable Vitals:   07/19/19 1315 07/19/19  1330 07/19/19 1453 07/19/19 1602  BP: (!) 182/69 (!) 184/69 (!) 186/78 (!) 199/79  Pulse: (!) 50 (!) 50 (!) 50 (!) 50  Resp: 12 12 15 19   Temp:      TempSrc:      SpO2: 99% 98% 93% 100%  Weight:      Height:       Eyes: PERRL, lids and conjunctivae normal ENMT: Mucous membranes are  Dry. . Posterior pharynx clear of any exudate or lesions .Neck: normal, supple,  Respiratory: clear to auscultation bilaterally, no wheezing, no crackles. Normal respiratory effort. Cardiovascular:  Irregular,  No extremity edema. 2+ pedal pulses.  Abdomen: no tenderness, no masses palpated.Bowel sounds positive.  Musculoskeletal: no clubbing / cyanosis. Good ROM, no contractures. Normal muscle tone.  Skin: no rashes, lesions, ulcers.  Neurologic: CN 2-12 grossly intact. Sensation intact,  Psychiatric:  Alert and oriented to place and person only.     Labs on Admission: I have personally reviewed following labs and imaging studies  CBC: Recent Labs  Lab 07/19/19 1409  WBC 5.8  NEUTROABS 4.2  HGB 11.0*  HCT 33.0*  MCV 96.5  PLT 409   Basic Metabolic Panel: Recent Labs  Lab 07/19/19 1409  NA 138  K 2.3*  CL 97*  CO2 29  GLUCOSE 91  BUN 39*  CREATININE 1.40*  CALCIUM 9.3   GFR: Estimated Creatinine Clearance: 16.9 mL/min (A) (by C-G formula based on SCr of 1.4 mg/dL (H)). Liver Function Tests: Recent Labs  Lab 07/19/19 1409  AST 21  ALT 17  ALKPHOS  54  BILITOT 1.3*  PROT 6.5  ALBUMIN 3.1*   Recent Labs  Lab 07/19/19 1409  LIPASE 29   No results for input(s): AMMONIA in the last 168 hours. Coagulation Profile: No results for input(s): INR, PROTIME in the last 168 hours. Cardiac Enzymes: No results for input(s): CKTOTAL, CKMB, CKMBINDEX, TROPONINI in the last 168 hours. BNP (last 3 results) No results for input(s): PROBNP in the last 8760 hours. HbA1C: No results for input(s): HGBA1C in the last 72 hours. CBG: No results for input(s): GLUCAP in the last 168 hours. Lipid Profile: No results for input(s): CHOL, HDL, LDLCALC, TRIG, CHOLHDL, LDLDIRECT in the last 72 hours. Thyroid Function Tests: Recent Labs    07/19/19 1409  TSH 0.638   Anemia Panel: No results for input(s): VITAMINB12, FOLATE, FERRITIN, TIBC, IRON, RETICCTPCT in the last 72 hours. Urine analysis:    Component Value Date/Time   COLORURINE YELLOW 09/02/2018 2110   APPEARANCEUR CLEAR 09/02/2018 2110   LABSPEC 1.011 09/02/2018 2110   LABSPEC 1.020 09/25/2015 1006   PHURINE 6.0 09/02/2018 2110   GLUCOSEU NEGATIVE 09/02/2018 2110   GLUCOSEU Negative 09/25/2015 1006   HGBUR NEGATIVE 09/02/2018 2110   BILIRUBINUR NEGATIVE 09/02/2018 2110   BILIRUBINUR Negative 09/25/2015 1006   KETONESUR NEGATIVE 09/02/2018 2110   PROTEINUR NEGATIVE 09/02/2018 2110   UROBILINOGEN 0.2 09/25/2015 1006   NITRITE NEGATIVE 09/02/2018 2110   LEUKOCYTESUR NEGATIVE 09/02/2018 2110   LEUKOCYTESUR Small 09/25/2015 1006    Radiological Exams on Admission: DG Chest Portable 1 View  Result Date: 07/19/2019 CLINICAL DATA:  Cough. EXAM: PORTABLE CHEST 1 VIEW COMPARISON:  September 02, 2018. FINDINGS: The heart size and mediastinal contours are within normal limits. Both lungs are clear. The visualized skeletal structures are unremarkable. IMPRESSION: No active disease. Aortic Atherosclerosis (ICD10-I70.0). Electronically Signed   By: Marijo Conception M.D.   On: 07/19/2019 14:09     EKG: atrial  rhythm.   Assessment/Plan Active Problems:   Hypokalemia    Decreased appetite, loss of taste, dry heaving probably from COVID related symptoms.  On discussing with daughter, there was no vomiting or diarrhea or any fevers at home.  Repeat COVID test ordered and pending.    Hypokalemia:  Probably from poor oral nutrition.  Replaced and repeat tonight and in am.    Prolonged Qtc, probably from hypokalemia .  Replace and repeat EKG in am.     Chronic atrial fibrillation  Rate controlled. Resume home amiodarone and eliquis for anticoagulation.    Stage 3 CKD:  Baseline creatinine between 1.3 to 1.6.  Currently at 1.4.    Mild normocytic anemia;  Monitor prn.    GERD; Stable.    Dementia:  Continue with aricept.   Severity of Illness: The appropriate patient status for this patient is OBSERVATION. Observation status is judged to be reasonable and necessary in order to provide the required intensity of service to ensure the patient's safety. The patient's presenting symptoms, physical exam findings, and initial radiographic and laboratory data in the context of their medical condition is felt to place them at decreased risk for further clinical deterioration. Furthermore, it is anticipated that the patient will be medically stable for discharge from the hospital within 2 midnights of admission.   DVT prophylaxis:eliquis.  Code Status: full code.  Family Communication: discussed with daughter over the phone.  Disposition Plan: pending normalization of k.  Consults called: none.  Admission status: progressive/ obs.    Hosie Poisson MD [facility]   07/19/2019, 4:59 PM

## 2019-07-19 NOTE — ED Notes (Signed)
Potassium 2.3 reported to Dr. Sherry Ruffing

## 2019-07-19 NOTE — ED Triage Notes (Signed)
Pt brought from home via EMS with c/o weakness and decreased appetite. Tested COVID positive on 12/18 per EMS. EMS reports pt was 94% on RA and 99% on 2L [place]. Hx of COPD, emphysema. Pt currently A&O x4, afebrile; denies pain, shortness of breath.

## 2019-07-20 ENCOUNTER — Inpatient Hospital Stay (HOSPITAL_COMMUNITY): Payer: Medicare Other

## 2019-07-20 DIAGNOSIS — R0902 Hypoxemia: Secondary | ICD-10-CM | POA: Diagnosis not present

## 2019-07-20 DIAGNOSIS — Z7901 Long term (current) use of anticoagulants: Secondary | ICD-10-CM | POA: Diagnosis not present

## 2019-07-20 DIAGNOSIS — J449 Chronic obstructive pulmonary disease, unspecified: Secondary | ICD-10-CM | POA: Diagnosis present

## 2019-07-20 DIAGNOSIS — I482 Chronic atrial fibrillation, unspecified: Secondary | ICD-10-CM | POA: Diagnosis present

## 2019-07-20 DIAGNOSIS — Z882 Allergy status to sulfonamides status: Secondary | ICD-10-CM | POA: Diagnosis not present

## 2019-07-20 DIAGNOSIS — D472 Monoclonal gammopathy: Secondary | ICD-10-CM | POA: Diagnosis present

## 2019-07-20 DIAGNOSIS — I129 Hypertensive chronic kidney disease with stage 1 through stage 4 chronic kidney disease, or unspecified chronic kidney disease: Secondary | ICD-10-CM | POA: Diagnosis present

## 2019-07-20 DIAGNOSIS — Z888 Allergy status to other drugs, medicaments and biological substances status: Secondary | ICD-10-CM | POA: Diagnosis not present

## 2019-07-20 DIAGNOSIS — R001 Bradycardia, unspecified: Secondary | ICD-10-CM | POA: Diagnosis not present

## 2019-07-20 DIAGNOSIS — U071 COVID-19: Secondary | ICD-10-CM | POA: Diagnosis present

## 2019-07-20 DIAGNOSIS — N390 Urinary tract infection, site not specified: Secondary | ICD-10-CM | POA: Diagnosis present

## 2019-07-20 DIAGNOSIS — Z823 Family history of stroke: Secondary | ICD-10-CM | POA: Diagnosis not present

## 2019-07-20 DIAGNOSIS — Z91040 Latex allergy status: Secondary | ICD-10-CM | POA: Diagnosis not present

## 2019-07-20 DIAGNOSIS — Z8616 Personal history of COVID-19: Secondary | ICD-10-CM | POA: Diagnosis not present

## 2019-07-20 DIAGNOSIS — Z91010 Allergy to peanuts: Secondary | ICD-10-CM | POA: Diagnosis not present

## 2019-07-20 DIAGNOSIS — F0390 Unspecified dementia without behavioral disturbance: Secondary | ICD-10-CM | POA: Diagnosis present

## 2019-07-20 DIAGNOSIS — M810 Age-related osteoporosis without current pathological fracture: Secondary | ICD-10-CM | POA: Diagnosis present

## 2019-07-20 DIAGNOSIS — K219 Gastro-esophageal reflux disease without esophagitis: Secondary | ICD-10-CM | POA: Diagnosis present

## 2019-07-20 DIAGNOSIS — Z79899 Other long term (current) drug therapy: Secondary | ICD-10-CM | POA: Diagnosis not present

## 2019-07-20 DIAGNOSIS — Z880 Allergy status to penicillin: Secondary | ICD-10-CM | POA: Diagnosis not present

## 2019-07-20 DIAGNOSIS — I480 Paroxysmal atrial fibrillation: Secondary | ICD-10-CM | POA: Diagnosis present

## 2019-07-20 DIAGNOSIS — E876 Hypokalemia: Secondary | ICD-10-CM | POA: Diagnosis not present

## 2019-07-20 DIAGNOSIS — Z886 Allergy status to analgesic agent status: Secondary | ICD-10-CM | POA: Diagnosis not present

## 2019-07-20 DIAGNOSIS — R05 Cough: Secondary | ICD-10-CM | POA: Diagnosis present

## 2019-07-20 DIAGNOSIS — I160 Hypertensive urgency: Secondary | ICD-10-CM | POA: Diagnosis not present

## 2019-07-20 DIAGNOSIS — N183 Chronic kidney disease, stage 3 unspecified: Secondary | ICD-10-CM | POA: Diagnosis present

## 2019-07-20 DIAGNOSIS — R9431 Abnormal electrocardiogram [ECG] [EKG]: Secondary | ICD-10-CM | POA: Diagnosis present

## 2019-07-20 LAB — BASIC METABOLIC PANEL
Anion gap: 17 — ABNORMAL HIGH (ref 5–15)
Anion gap: 16 — ABNORMAL HIGH (ref 5–15)
BUN: 29 mg/dL — ABNORMAL HIGH (ref 8–23)
BUN: 30 mg/dL — ABNORMAL HIGH (ref 8–23)
CO2: 24 mmol/L (ref 22–32)
CO2: 24 mmol/L (ref 22–32)
Calcium: 8.7 mg/dL — ABNORMAL LOW (ref 8.9–10.3)
Calcium: 8.8 mg/dL — ABNORMAL LOW (ref 8.9–10.3)
Chloride: 99 mmol/L (ref 98–111)
Chloride: 97 mmol/L — ABNORMAL LOW (ref 98–111)
Creatinine, Ser: 1.12 mg/dL — ABNORMAL HIGH (ref 0.44–1.00)
Creatinine, Ser: 1.11 mg/dL — ABNORMAL HIGH (ref 0.44–1.00)
GFR calc Af Amer: 50 mL/min — ABNORMAL LOW (ref >60)
GFR calc Af Amer: 50 mL/min — ABNORMAL LOW (ref >60)
GFR calc non Af Amer: 43 mL/min — ABNORMAL LOW (ref >60)
GFR calc non Af Amer: 43 mL/min — ABNORMAL LOW (ref >60)
Glucose, Bld: 88 mg/dL (ref 70–99)
Glucose, Bld: 103 mg/dL — ABNORMAL HIGH (ref 70–99)
Potassium: 2.7 mmol/L — CL (ref 3.5–5.1)
Potassium: 3.6 mmol/L (ref 3.5–5.1)
Sodium: 140 mmol/L (ref 135–145)
Sodium: 137 mmol/L (ref 135–145)

## 2019-07-20 LAB — FERRITIN: Ferritin: 171 ng/mL (ref 11–307)

## 2019-07-20 LAB — MAGNESIUM: Magnesium: 1.2 mg/dL — ABNORMAL LOW (ref 1.7–2.4)

## 2019-07-20 LAB — ABO/RH: ABO/RH(D): O POS

## 2019-07-20 LAB — C-REACTIVE PROTEIN: CRP: 2.7 mg/dL — ABNORMAL HIGH (ref <1.0)

## 2019-07-20 LAB — D-DIMER, QUANTITATIVE: D-Dimer, Quant: 0.38 ug/mL-FEU (ref 0.00–0.50)

## 2019-07-20 MED ORDER — METHYLPREDNISOLONE SODIUM SUCC 125 MG IJ SOLR
60.0000 mg | Freq: Two times a day (BID) | INTRAMUSCULAR | Status: DC
  Administered 2019-07-20: 60 mg via INTRAVENOUS
  Filled 2019-07-20: qty 2

## 2019-07-20 MED ORDER — AMLODIPINE BESYLATE 10 MG PO TABS
10.0000 mg | ORAL_TABLET | Freq: Every day | ORAL | Status: DC
  Administered 2019-07-21 – 2019-07-24 (×4): 10 mg via ORAL
  Filled 2019-07-20 (×4): qty 1

## 2019-07-20 MED ORDER — HYDRALAZINE HCL 25 MG PO TABS
50.0000 mg | ORAL_TABLET | Freq: Three times a day (TID) | ORAL | Status: DC
  Administered 2019-07-21 – 2019-07-24 (×10): 50 mg via ORAL
  Filled 2019-07-20 (×11): qty 2

## 2019-07-20 MED ORDER — HYDRALAZINE HCL 25 MG PO TABS
25.0000 mg | ORAL_TABLET | Freq: Three times a day (TID) | ORAL | Status: DC
  Administered 2019-07-20: 25 mg via ORAL
  Filled 2019-07-20: qty 1

## 2019-07-20 MED ORDER — SODIUM CHLORIDE 0.9 % IV SOLN: INTRAVENOUS | Status: DC

## 2019-07-20 MED ORDER — GUAIFENESIN ER 600 MG PO TB12
1200.0000 mg | ORAL_TABLET | Freq: Two times a day (BID) | ORAL | Status: DC
  Administered 2019-07-20 – 2019-07-22 (×5): 1200 mg via ORAL
  Filled 2019-07-20 (×5): qty 2

## 2019-07-20 MED ORDER — LORATADINE 10 MG PO TABS
10.0000 mg | ORAL_TABLET | Freq: Every day | ORAL | Status: DC
  Administered 2019-07-20 – 2019-07-24 (×5): 10 mg via ORAL
  Filled 2019-07-20 (×5): qty 1

## 2019-07-20 MED ORDER — POTASSIUM CHLORIDE 20 MEQ PO PACK
40.0000 meq | PACK | ORAL | Status: AC
  Administered 2019-07-20 (×2): 40 meq via ORAL
  Filled 2019-07-20 (×2): qty 2

## 2019-07-20 MED ORDER — MAGNESIUM SULFATE 4 GM/100ML IV SOLN
4.0000 g | Freq: Once | INTRAVENOUS | Status: AC
  Administered 2019-07-20: 4 g via INTRAVENOUS
  Filled 2019-07-20: qty 100

## 2019-07-20 MED ORDER — PROPRANOLOL HCL ER 60 MG PO CP24: 120.0000 mg | ORAL_CAPSULE | Freq: Two times a day (BID) | ORAL | Status: DC

## 2019-07-20 MED ORDER — POTASSIUM CHLORIDE IN NACL 40-0.9 MEQ/L-% IV SOLN
INTRAVENOUS | Status: DC
  Administered 2019-07-20 – 2019-07-21 (×2): 75 mL/h via INTRAVENOUS
  Administered 2019-07-22: 50 mL/h via INTRAVENOUS
  Filled 2019-07-20 (×3): qty 1000

## 2019-07-20 MED ORDER — GLYCERIN-HYPROMELLOSE-PEG 400 0.2-0.2-1 % OP SOLN: 1.0000 [drp] | OPHTHALMIC | Status: DC | PRN

## 2019-07-20 MED ORDER — POLYVINYL ALCOHOL 1.4 % OP SOLN
1.0000 [drp] | OPHTHALMIC | Status: DC | PRN
  Filled 2019-07-20: qty 15

## 2019-07-20 MED ORDER — LOSARTAN POTASSIUM 50 MG PO TABS: 100.0000 mg | ORAL_TABLET | Freq: Every day | ORAL | Status: DC

## 2019-07-20 MED ORDER — SODIUM CHLORIDE 0.9 % IV SOLN
1.0000 g | INTRAVENOUS | Status: DC
  Administered 2019-07-20 – 2019-07-24 (×5): 1 g via INTRAVENOUS
  Filled 2019-07-20 (×5): qty 10

## 2019-07-20 MED ORDER — ISOSORBIDE DINITRATE 10 MG PO TABS
10.0000 mg | ORAL_TABLET | Freq: Three times a day (TID) | ORAL | Status: DC
  Administered 2019-07-20 – 2019-07-24 (×11): 10 mg via ORAL
  Filled 2019-07-20 (×15): qty 1

## 2019-07-20 MED ORDER — LOSARTAN POTASSIUM 25 MG PO TABS
100.0000 mg | ORAL_TABLET | Freq: Every day | ORAL | Status: DC
  Administered 2019-07-20 – 2019-07-24 (×5): 100 mg via ORAL
  Filled 2019-07-20 (×3): qty 4
  Filled 2019-07-20: qty 2
  Filled 2019-07-20: qty 4

## 2019-07-20 MED ORDER — HYDRALAZINE HCL 20 MG/ML IJ SOLN
5.0000 mg | Freq: Four times a day (QID) | INTRAMUSCULAR | Status: DC | PRN
  Administered 2019-07-20 – 2019-07-21 (×3): 5 mg via INTRAVENOUS
  Filled 2019-07-20 (×3): qty 1

## 2019-07-20 NOTE — ED Notes (Signed)
Manual BP 200/70

## 2019-07-20 NOTE — Progress Notes (Addendum)
PROGRESS NOTE    [name]  EQA:[id number] DOB: [birth year] DOA: 07/19/2019 PCP: Gaynelle Arabian, MD   Brief Narrative:  HPI per Dr. Aline August [name] is a 85 y.o. female with medical history significant of recent covid positive illness on 12/17 s/p  Bamlanivimab infusion, chronic atrial fibrillation, COPD, GERD, depression, dementia, was brought in for generalized weakness, decreased appetite. Pt is alert but was confused . She was able to tell admitting physician, that she didn't have taste and couldn't eat anything. She denied any chest pain , sob, nausea, vomiting or abd pain. No headache fever or chills. No dysuria. She was referred to [facility] for admission for hypokalemia and prolonged QTC.   ED Course: on arrival to ED, she was afebrile, hypertensive, sats were 94 % on RA, labs revealed sodium of 138, potassium of 2.3, lactic acid of 1.4. and hemoglobin of 11. CXR was negative for pneumonia.  Repeat COVID test in ED pending.   Assessment & Plan:   Principal Problem:   COVID-19 virus infection Active Problems:   Hypomagnesemia   Hypokalemia   QT prolongation   GERD (gastroesophageal reflux disease)   CKD (chronic kidney disease) stage 3, GFR 30-59 ml/min (HCC)   Chronic a-fib (HCC)   Deficiency anemia   Acute lower UTI   Bradycardia  1 decreased appetite, loss of taste, nausea, dry heaves likely secondary to Covid related symptoms/COVID-19 infection Patient noted to recently have been diagnosed with Covid 19 and received by bamlanivimab infusion on 07/14/2019. Patient presented back with generalized weakness and decreased appetite with some dry heaves and loss of taste. SARS coronavirus antigen was positive. Chest x-ray with no acute infiltrates. Patient with sats of 96-97% on 2 L nasal cannula. Will check inflammatory markers. Place on Solu-Medrol 60 mg IV every 12 hours. Patient also empirically on IV Rocephin for probable UTI. Follow.  2. UTI Check urine cultures.  Placed on IV Rocephin.  3. Hypokalemia Likely secondary to possible GI losses and decreased oral intake. Repeat potassium this morning at 2.7 from 2.3 on admission. Place potassium and IV fluids. Potassium 40 mEq p.o. every 4 hours x2 doses. Magnesium was 1.3 on admission and as such we will give magnesium sulfate 4 g IV x1. Repeat basic metabolic profile this afternoon and replace as needed.  4. QTc prolongation Likely secondary to electrolyte abnormalities. Magnesium was 1.3 on admission. Repeat magnesium level this morning. Magnesium sulfate 4 g IV x1. Keep magnesium greater than 2. Potassium was 2.3 on admission and currently at 2.7. Potassium 40 mEq p.o. every 4 hours x2 doses. Keep potassium greater than 4. Repeat EKG.  5. Chronic atrial fibrillation/bradycardia Currently rate controlled. Patient bradycardia. Will discontinue patient's beta-blocker for now. Hold amiodarone today and resume tomorrow. Continue Eliquis for anticoagulation.  6. Chronic kidney disease stage III Stable.  7. Normocytic anemia Follow H&H.  8. Gastroesophageal reflux disease PPI.  9. Dementia Continue home regimen Aricept.  10.  Hypertensive urgency Patient noted on admission to have systolic blood pressures in the 200s.  Patient was on a beta-blocker which has been discontinued due to bradycardia.  Resume patient's home dose Cozaar at 100 mg daily.  IV hydralazine as needed.   DVT prophylaxis: Eliquis Code Status: Full Family Communication: Updated patient.  Tried calling daughter on telephone however got voicemail.  Disposition Plan: Transfer to [facility].   Consultants:   None  Procedures:   Chest x-ray 07/19/2019, 07/20/2019  Antimicrobials:   IV Rocephin 07/20/2019  Subjective: Patient in bed. Complaining of some nausea. No further dry heaves. Stated she has not eaten in several days. No chest pain. No shortness of breath.  Objective: Vitals:   07/20/19 1301 07/20/19  1345 07/20/19 1515 07/20/19 1530  BP:  (!) 168/78 (!) 159/86   Pulse: (!) 48 (!) 46 (!) 43 (!) 46  Resp: (!) 21 (!) 21 15 17   Temp:      TempSrc:      SpO2: 96% 94% 95% 96%  Weight:      Height:        Intake/Output Summary (Last 24 hours) at 07/20/2019 1637 Last data filed at 07/20/2019 1427 Gross per 24 hour  Intake 1151 ml  Output 800 ml  Net 351 ml   Filed Weights   07/19/19 1246  Weight: 40.8 kg    Examination:  General exam: NAD. Dry mucous membranes. Respiratory system: Coarse rhonchorus basilar BS. No wheezing. Normal respiratory effort.  Cardiovascular system: Irregularly irregular. Bradycardic. No JVD. No lower extremity edema. Gastrointestinal system: Abdomen is nondistended, soft and nontender. No organomegaly or masses felt. Normal bowel sounds heard. Central nervous system: Alert and oriented. No focal neurological deficits. Extremities: Symmetric 5 x 5 power. Skin: No rashes, lesions or ulcers Psychiatry: Judgement and insight appear fair.. Mood & affect appropriate.     Data Reviewed: I have personally reviewed following labs and imaging studies  CBC: Recent Labs  Lab 07/19/19 1409  WBC 5.8  NEUTROABS 4.2  HGB 11.0*  HCT 33.0*  MCV 96.5  PLT 725   Basic Metabolic Panel: Recent Labs  Lab 07/19/19 1409 07/19/19 1754 07/20/19 0500 07/20/19 1200  NA 138 139 140  --   K 2.3* 2.7* 2.7*  --   CL 97* 98 99  --   CO2 29 29 24   --   GLUCOSE 91 82 88  --   BUN 39* 37* 29*  --   CREATININE 1.40* 1.35* 1.12*  --   CALCIUM 9.3 8.9 8.7*  --   MG  --  1.3*  --  1.2*   GFR: Estimated Creatinine Clearance: 21.1 mL/min (A) (by C-G formula based on SCr of 1.12 mg/dL (H)). Liver Function Tests: Recent Labs  Lab 07/19/19 1409  AST 21  ALT 17  ALKPHOS 54  BILITOT 1.3*  PROT 6.5  ALBUMIN 3.1*   Recent Labs  Lab 07/19/19 1409  LIPASE 29   No results for input(s): AMMONIA in the last 168 hours. Coagulation Profile: No results for input(s):  INR, PROTIME in the last 168 hours. Cardiac Enzymes: No results for input(s): CKTOTAL, CKMB, CKMBINDEX, TROPONINI in the last 168 hours. BNP (last 3 results) No results for input(s): PROBNP in the last 8760 hours. HbA1C: No results for input(s): HGBA1C in the last 72 hours. CBG: No results for input(s): GLUCAP in the last 168 hours. Lipid Profile: No results for input(s): CHOL, HDL, LDLCALC, TRIG, CHOLHDL, LDLDIRECT in the last 72 hours. Thyroid Function Tests: Recent Labs    07/19/19 1409  TSH 0.638   Anemia Panel: Recent Labs    07/20/19 1200  FERRITIN 171   Sepsis Labs: Recent Labs  Lab 07/19/19 1409 07/19/19 1754  LATICACIDVEN 1.4 1.2    No results found for this or any previous visit (from the past 240 hour(s)).       Radiology Studies: DG CHEST PORT 1 VIEW  Result Date: 07/20/2019 CLINICAL DATA:  Cough, COVID-19 positive EXAM: PORTABLE CHEST 1 VIEW COMPARISON:  07/19/2019 FINDINGS:  Mild cardiomegaly. Aortic atherosclerosis. Both lungs are clear. The visualized skeletal structures are unremarkable. IMPRESSION: No acute abnormality of the lungs in AP portable projection. Electronically Signed   By: Eddie Candle M.D.   On: 07/20/2019 09:58   DG Chest Portable 1 View  Result Date: 07/19/2019 CLINICAL DATA:  Cough. EXAM: PORTABLE CHEST 1 VIEW COMPARISON:  September 02, 2018. FINDINGS: The heart size and mediastinal contours are within normal limits. Both lungs are clear. The visualized skeletal structures are unremarkable. IMPRESSION: No active disease. Aortic Atherosclerosis (ICD10-I70.0). Electronically Signed   By: Marijo Conception M.D.   On: 07/19/2019 14:09        Scheduled Meds: . amiodarone  100 mg Oral Daily  . apixaban  2.5 mg Oral BID  . benzonatate  200 mg Oral TID  . donepezil  10 mg Oral QHS  . guaiFENesin  1,200 mg Oral BID  . loratadine  10 mg Oral Daily  . losartan  100 mg Oral Daily  . methylPREDNISolone (SOLU-MEDROL) injection  60 mg  Intravenous Q12H  . [START ON 07/21/2019] propranolol ER  120 mg Oral BID  . sertraline  100 mg Oral Daily  . traZODone  50 mg Oral QHS   Continuous Infusions: . 0.9 % NaCl with KCl 40 mEq / L 75 mL/hr (07/20/19 1217)  . cefTRIAXone (ROCEPHIN)  IV Stopped (07/20/19 1214)     LOS: 0 days    Time spent: 40 minutes    Irine Seal, MD [facility]  If 7PM-7AM, please contact night-coverage [email or web address] 07/20/2019, 4:37 PM

## 2019-07-20 NOTE — ED Notes (Signed)
Paged APP Sharlet Salina to RN [facility]

## 2019-07-20 NOTE — ED Notes (Signed)
Paged Dr. Grandville Silos regarding latest potassium value

## 2019-07-20 NOTE — ED Notes (Signed)
[name] ([facility]/Transfer to [facility]) called @ 1615-per Mary Reef, RN called by [name]

## 2019-07-20 NOTE — ED Notes (Signed)
Notified Doug of [facility] that pt is ready for transport

## 2019-07-20 NOTE — Evaluation (Signed)
Physical Therapy Evaluation Patient Details Name: [name] MRN: [id number] DOB: 13-Sep-1925 Today's Date: 07/20/2019   History of Present Illness  85 y.o. female with medical history significant of recent covid positive illness on 12/17 s/p Bamlanivimab infusion, chronic atrial fibrillation, COPD, GERD, depression, dementia, was brought in for generalized weakness, decreased appetite.    Clinical Impression  Pt admitted with above diagnosis. PTA pt lived at home, independent mobility. On eval, pt required min assist bed mobility. Unable to progress OOB due to diarrhea/dry heaving. Pt reports is starts every time she moves. Pt HR ranging 48-53 during session. Pt on RA with SpO2 92%.  Pt currently with functional limitations due to the deficits listed below (see PT Problem List). Pt will benefit from skilled PT to increase their independence and safety with mobility to allow discharge to the venue listed below.       Follow Up Recommendations Home health PT;Supervision/Assistance - 24 hour    Equipment Recommendations  Other (comment)(TBD)    Recommendations for Other Services       Precautions / Restrictions Precautions Precautions: Fall;Other (comment) Precaution Comments: bradycardia      Mobility  Bed Mobility Overal bed mobility: Needs Assistance Bed Mobility: Rolling;Supine to Sit;Sit to Supine Rolling: Min assist   Supine to sit: Min assist;HOB elevated Sit to supine: Min assist;HOB elevated   General bed mobility comments: +rails, increased time  Transfers                 General transfer comment: unable to progress OOB due to diarrhea/dry heaving. Pt required return to supine.  Ambulation/Gait                Stairs            Wheelchair Mobility    Modified Rankin (Stroke Patients Only)       Balance                                             Pertinent Vitals/Pain Pain Assessment: No/denies pain    Home  Living Family/patient expects to be discharged to:: Private residence   Available Help at Discharge: Family;Available PRN/intermittently Type of Home: House Home Access: Stairs to enter   [employer] of Steps: 5 Home Layout: One level Home Equipment: Cane - single point      Prior Function Level of Independence: Independent with assistive device(s)               Hand Dominance   Dominant Hand: Right    Extremity/Trunk Assessment   Upper Extremity Assessment Upper Extremity Assessment: Defer to OT evaluation    Lower Extremity Assessment Lower Extremity Assessment: Generalized weakness       Communication   Communication: No difficulties  Cognition Arousal/Alertness: Awake/alert Behavior During Therapy: WFL for tasks assessed/performed Overall Cognitive Status: Within Functional Limits for tasks assessed                                        General Comments      Exercises     Assessment/Plan    PT Assessment Patient needs continued PT services  PT Problem List Decreased strength;Decreased mobility;Decreased activity tolerance       PT Treatment Interventions Therapeutic activities;Patient/family education;Functional mobility training;Stair training;Gait training;Balance  training;DME instruction;Therapeutic exercise    PT Goals (Current goals can be found in the Care Plan section)  Acute Rehab PT Goals Patient Stated Goal: not stated PT Goal Formulation: With patient Time For Goal Achievement: 08/03/19 Potential to Achieve Goals: Good    Frequency Min 3X/week   Barriers to discharge        Co-evaluation               AM-PAC PT "6 Clicks" Mobility  Outcome Measure Help needed turning from your back to your side while in a flat bed without using bedrails?: A Little Help needed moving from lying on your back to sitting on the side of a flat bed without using bedrails?: A Little Help needed moving to and from a  bed to a chair (including a wheelchair)?: A Little Help needed standing up from a chair using your arms (e.g., wheelchair or bedside chair)?: A Little Help needed to walk in hospital room?: A Lot Help needed climbing 3-5 steps with a railing? : A Lot 6 Click Score: 16    End of Session   Activity Tolerance: Treatment limited secondary to medical complications (Comment)(diarrhea, dry heaving) Patient left: in bed;with call bell/phone within reach Nurse Communication: Mobility status PT Visit Diagnosis: Muscle weakness (generalized) (M62.81)    Time: 9563-8756 PT Time Calculation (min) (ACUTE ONLY): 17 min   Charges:   PT Evaluation $PT Eval Moderate Complexity: 1 Mod          Lorrin Goodell, PT  Office # [phone] Pager [phone]   [name] 07/20/2019, 12:29 PM

## 2019-07-20 NOTE — ED Notes (Signed)
Patient's blood pressure is above 119 systolic; attending has been paged.

## 2019-07-20 NOTE — ED Notes (Signed)
Lunch Tray Ordered @ 1033. 

## 2019-07-20 NOTE — ED Notes (Signed)
Report given to Ovid Curd, Therapist, sports at [facility]

## 2019-07-20 NOTE — ED Notes (Signed)
PT at bedside.

## 2019-07-20 NOTE — ED Notes (Signed)
Pt state having diarrhea, and ask for treatment for it

## 2019-07-20 NOTE — ED Notes (Signed)
Updated pts daughter Mary.

## 2019-07-20 NOTE — Progress Notes (Signed)
[name]  XIH:[id number] DOB: [birth year] DOA: 07/19/2019 PCP: Gaynelle Arabian, MD    Brief Narrative:  85 year old with a history of chronic atrial fibrillation, COPD, GERD, depression, and dementia who was diagnosed with Covid 12/17 and underwent Bamlanivimab infusion and was transported to the emergency room 12/28 with generalized weakness and very poor appetite.  In the ED she was afebrile but hypertensive with sats 94% on room air and no apparent infiltrate on CXR.  Significant Events: 12/17 tested positive for Covid 12/23, always here man of left ear sure why not met will discovery to patient's monoclonal antibody infusion 12/28 admit to [facility] via ED 12/29 transfer to [facility]  COVID-19 specific Treatment: 12/23 bamlanivimab  Antimicrobials:  Rocephin  Subjective: Patient seen by Dr. Grandville Silos earlier today.  Assessment & Plan:  SARS-CoV-2 infection  UTI  Uncontrolled HTN  Hypokalemia  Prolonged QTC  Chronic atrial fibrillation with bradycardia  CKD stage III  Normocytic anemia  GERD  Dementia  DVT prophylaxis: Eliquis Code Status: FULL CODE Family Communication:  Disposition Plan:   Consultants:  none  Objective: Blood pressure (!) 210/83, pulse (!) 47, temperature 98 F (36.7 C), temperature source Oral, resp. rate 18, height '5\' 3"'  (1.6 m), weight 40.8 kg, SpO2 95 %.  Intake/Output Summary (Last 24 hours) at 07/20/2019 1753 Last data filed at 07/20/2019 1427 Gross per 24 hour  Intake 1051 ml  Output 800 ml  Net 251 ml   Filed Weights   07/19/19 1246  Weight: 40.8 kg    Examination:   CBC: Recent Labs  Lab 07/19/19 1409  WBC 5.8  NEUTROABS 4.2  HGB 11.0*  HCT 33.0*  MCV 96.5  PLT 349   Basic Metabolic Panel: Recent Labs  Lab 07/19/19 1754 07/20/19 0500 07/20/19 1200 07/20/19 1634  NA 139 140  --  137  K 2.7* 2.7*  --  3.6  CL 98 99  --  97*  CO2 29 24  --  24  GLUCOSE 82 88  --  103*  BUN 37* 29*   --  30*  CREATININE 1.35* 1.12*  --  1.11*  CALCIUM 8.9 8.7*  --  8.8*  MG 1.3*  --  1.2*  --    GFR: Estimated Creatinine Clearance: 21.3 mL/min (A) (by C-G formula based on SCr of 1.11 mg/dL (H)).  Liver Function Tests: Recent Labs  Lab 07/19/19 1409  AST 21  ALT 17  ALKPHOS 54  BILITOT 1.3*  PROT 6.5  ALBUMIN 3.1*   Recent Labs  Lab 07/19/19 1409  LIPASE 29    HbA1C: No results found for: HGBA1C  CBG: No results for input(s): GLUCAP in the last 168 hours.  No results found for this or any previous visit (from the past 240 hour(s)).   Scheduled Meds: . amiodarone  100 mg Oral Daily  . apixaban  2.5 mg Oral BID  . benzonatate  200 mg Oral TID  . donepezil  10 mg Oral QHS  . guaiFENesin  1,200 mg Oral BID  . loratadine  10 mg Oral Daily  . losartan  100 mg Oral Daily  . methylPREDNISolone (SOLU-MEDROL) injection  60 mg Intravenous Q12H  . sertraline  100 mg Oral Daily  . traZODone  50 mg Oral QHS   Continuous Infusions: . 0.9 % NaCl with KCl 40 mEq / L 75 mL/hr (07/20/19 1217)  . cefTRIAXone (ROCEPHIN)  IV Stopped (07/20/19 1214)     LOS: 0 days  Cherene Altes, MD [facility] Office  [phone] Pager - Text Page per Amion  If 7PM-7AM, please contact night-coverage per Amion 07/20/2019, 5:53 PM

## 2019-07-20 NOTE — Progress Notes (Signed)
OT Cancellation  Pt in the process of being transferred to [place]. Will defer OT eval to [facility].  Maurie Boettcher, OT/L   Acute OT Clinical Specialist Acute Rehabilitation Services Pager [phone] Office [phone]

## 2019-07-20 NOTE — Progress Notes (Signed)
Spoke with Patient's daughter Bonner Puna and provided updates.

## 2019-07-21 DIAGNOSIS — I482 Chronic atrial fibrillation, unspecified: Secondary | ICD-10-CM

## 2019-07-21 DIAGNOSIS — N390 Urinary tract infection, site not specified: Principal | ICD-10-CM

## 2019-07-21 DIAGNOSIS — N1831 Chronic kidney disease, stage 3a: Secondary | ICD-10-CM

## 2019-07-21 DIAGNOSIS — K219 Gastro-esophageal reflux disease without esophagitis: Secondary | ICD-10-CM

## 2019-07-21 DIAGNOSIS — R9431 Abnormal electrocardiogram [ECG] [EKG]: Secondary | ICD-10-CM

## 2019-07-21 DIAGNOSIS — E876 Hypokalemia: Secondary | ICD-10-CM

## 2019-07-21 DIAGNOSIS — R001 Bradycardia, unspecified: Secondary | ICD-10-CM

## 2019-07-21 DIAGNOSIS — U071 COVID-19: Secondary | ICD-10-CM

## 2019-07-21 LAB — COMPREHENSIVE METABOLIC PANEL
ALT: 12 U/L (ref 0–44)
AST: 19 U/L (ref 15–41)
Albumin: 2.9 g/dL — ABNORMAL LOW (ref 3.5–5.0)
Alkaline Phosphatase: 50 U/L (ref 38–126)
Anion gap: 13 (ref 5–15)
BUN: 40 mg/dL — ABNORMAL HIGH (ref 8–23)
CO2: 24 mmol/L (ref 22–32)
Calcium: 8.2 mg/dL — ABNORMAL LOW (ref 8.9–10.3)
Chloride: 99 mmol/L (ref 98–111)
Creatinine, Ser: 1 mg/dL (ref 0.44–1.00)
GFR calc Af Amer: 57 mL/min — ABNORMAL LOW (ref >60)
GFR calc non Af Amer: 49 mL/min — ABNORMAL LOW (ref >60)
Glucose, Bld: 109 mg/dL — ABNORMAL HIGH (ref 70–99)
Potassium: 3.2 mmol/L — ABNORMAL LOW (ref 3.5–5.1)
Sodium: 136 mmol/L (ref 135–145)
Total Bilirubin: 1.5 mg/dL — ABNORMAL HIGH (ref 0.3–1.2)
Total Protein: 6.3 g/dL — ABNORMAL LOW (ref 6.5–8.1)

## 2019-07-21 LAB — CBC WITH DIFFERENTIAL/PLATELET
Abs Immature Granulocytes: 0.02 10*3/uL (ref 0.00–0.07)
Basophils Absolute: 0 10*3/uL (ref 0.0–0.1)
Basophils Relative: 0 %
Eosinophils Absolute: 0 10*3/uL (ref 0.0–0.5)
Eosinophils Relative: 0 %
HCT: 30.7 % — ABNORMAL LOW (ref 36.0–46.0)
Hemoglobin: 10.1 g/dL — ABNORMAL LOW (ref 12.0–15.0)
Immature Granulocytes: 0 %
Lymphocytes Relative: 18 %
Lymphs Abs: 1 10*3/uL (ref 0.7–4.0)
MCH: 31.5 pg (ref 26.0–34.0)
MCHC: 32.9 g/dL (ref 30.0–36.0)
MCV: 95.6 fL (ref 80.0–100.0)
Monocytes Absolute: 0.3 10*3/uL (ref 0.1–1.0)
Monocytes Relative: 5 %
Neutro Abs: 4.4 10*3/uL (ref 1.7–7.7)
Neutrophils Relative %: 77 %
Platelets: 229 10*3/uL (ref 150–400)
RBC: 3.21 MIL/uL — ABNORMAL LOW (ref 3.87–5.11)
RDW: 13 % (ref 11.5–15.5)
WBC: 5.7 10*3/uL (ref 4.0–10.5)
nRBC: 0 % (ref 0.0–0.2)

## 2019-07-21 LAB — D-DIMER, QUANTITATIVE: D-Dimer, Quant: 0.42 ug/mL-FEU (ref 0.00–0.50)

## 2019-07-21 LAB — C-REACTIVE PROTEIN: CRP: 2.4 mg/dL — ABNORMAL HIGH (ref <1.0)

## 2019-07-21 LAB — PHOSPHORUS: Phosphorus: 2.8 mg/dL (ref 2.5–4.6)

## 2019-07-21 LAB — MAGNESIUM: Magnesium: 2.4 mg/dL (ref 1.7–2.4)

## 2019-07-21 LAB — URINE CULTURE: Culture: 50000 — AB

## 2019-07-21 LAB — FERRITIN: Ferritin: 197 ng/mL (ref 11–307)

## 2019-07-21 MED ORDER — ENSURE ENLIVE PO LIQD
237.0000 mL | Freq: Three times a day (TID) | ORAL | Status: DC
  Administered 2019-07-21 – 2019-07-24 (×11): 237 mL via ORAL

## 2019-07-21 MED ORDER — POTASSIUM CHLORIDE CRYS ER 20 MEQ PO TBCR
40.0000 meq | EXTENDED_RELEASE_TABLET | Freq: Once | ORAL | Status: AC
  Administered 2019-07-21: 40 meq via ORAL
  Filled 2019-07-21: qty 2

## 2019-07-21 MED ORDER — ADULT MULTIVITAMIN W/MINERALS CH
1.0000 | ORAL_TABLET | Freq: Every day | ORAL | Status: DC
  Administered 2019-07-21 – 2019-07-24 (×4): 1 via ORAL
  Filled 2019-07-21 (×4): qty 1

## 2019-07-21 NOTE — Progress Notes (Signed)
Pt. received 1063ml of fluids and did not urinate.  Bladder scan confirmed 23ml of retention.  MD page

## 2019-07-21 NOTE — Progress Notes (Signed)
Occupational therapy Evaluation  PTA, pt lived at home with her daughter and had 24/7 caregivers who supervised mobility and assisted with ADL tasks. Spoke with daughter Stanton Kidney) over the phone who stated that her mother was ambulating using a RW, but has spent more time in the bed since becoming ill 1 1/2 weeks ago. Pt currently requires mod A with stand pivot transfers and is unable to ambulate at this time. Requires max to total assist with bathing and dressing.Orthostatic with mobility: supine 140/58; sitting 93/60 on BSC. Recommend supervision with meals to increase PO intake. Daughter would like for pt to return home with HHOT/PT - made daughter aware that pt will need more physical assistance than she was requiring before admission. Daughter verbalized understanding. Will follow acutely.     07/21/19 0900  OT Visit Information  Last OT Received On 07/21/19  Assistance Needed +2 (for chair follow)  History of Present Illness 85 y.o. female with medical history significant of recent covid positive illness on 12/17 s/p Bamlanivimab infusion, chronic atrial fibrillation, COPD, GERD, depression, dementia, was brought in for generalized weakness, decreased appetite.  Precautions  Precautions Fall  Home Living  Family/patient expects to be discharged to: Private residence  Living Arrangements Other (Comment)  Available Help at Discharge Available 24 hours/day  Type of [place] Access Level entry  Entrance Stairs-Number of Steps 0  Home Layout One level  Engineer, manufacturing systems Yes  How Accessible Accessible via walker  [place] BSC;Cane - single point  Prior Function  Level of Independence Needs assistance  Gait / Transfers Assistance Needed staff assisted as neededwith ambulation - walked behind her while using the RW  ADL's / [place] caregivers assist with ADL  Communication / [place]  Communication  Communication HOH  Pain Assessment  Pain Assessment Faces  Faces Pain Scale 4  Pain Location general discomfort  Pain Descriptors / Indicators Discomfort  Pain Intervention(s) Limited activity within patient's tolerance  Cognition  Arousal/Alertness Awake/alert  Behavior During Therapy WFL for tasks assessed/performed  Overall Cognitive Status Impaired/Different from baseline  Area of Impairment Orientation;Attention;Memory;Following commands;Safety/judgement;Awareness;Problem solving  Orientation Level Disoriented to;Place;Time;Situation  Current Attention Level Sustained  Memory Decreased recall of precautions;Decreased short-term memory  Following Commands Follows one step commands with increased time  Safety/Judgement Decreased awareness of safety;Decreased awareness of deficits  Awareness Emergent  Problem Solving Slow processing;Decreased initiation Pt referring to herself as "Stanton Kidney" - that is her daughter's name; daughter states she is more confused than normal  Upper Extremity Assessment  Upper Extremity Assessment Generalized weakness  Lower Extremity Assessment  Lower Extremity Assessment Defer to PT evaluation  Cervical / Trunk Assessment  Cervical / Trunk Assessment Kyphotic;Other exceptions (forward head)  ADL  Overall ADL's  Needs assistance/impaired  Grooming Moderate assistance;Brushing hair  Upper Body Bathing Moderate assistance;Sitting  Lower Body Bathing Maximal assistance;Sit to/from stand  Upper Body Dressing  Moderate assistance;Sitting  Lower Body Dressing Total assistance;Sit to/from Retail buyer Moderate assistance;+2 for safety/equipment;BSC  Toileting- Clothing Manipulation and Hygiene Maximal assistance  Functional mobility during ADLs Moderate assistance;Cueing for safety;+2 for safety/equipment  Vision- History  Baseline Vision/History Wears glasses  Vision- Assessment  Additional Comments does not  have them at [facility]  Bed Mobility  Overal bed mobility Needs Assistance  Bed Mobility Supine to Sit  Supine to sit Mod assist  General bed mobility comments required increased assistance  Transfers  Overall transfer level Needs assistance  Transfers Sit to/from Stand;Stand Pivot Transfers  Sit to Stand Mod assist  Stand pivot transfers +2 safety/equipment;Mod assist  Balance  Overall balance assessment Needs assistance  Sitting-balance support Feet supported  Sitting balance-Leahy Scale Fair  Standing balance-Leahy Scale Poor  OT - End of Session  Equipment Utilized During Treatment Gait belt  Activity Tolerance Patient tolerated treatment well  Patient left in chair;with call bell/phone within reach;with chair alarm set  Nurse Communication Mobility status  OT Assessment  OT Recommendation/Assessment Patient needs continued OT Services  OT Visit Diagnosis Unsteadiness on feet (R26.81);Other abnormalities of gait and mobility (R26.89);Muscle weakness (generalized) (M62.81);Other symptoms and signs involving cognitive function  OT Problem List Decreased strength;Decreased activity tolerance;Impaired balance (sitting and/or standing);Impaired vision/perception;Decreased cognition;Decreased safety awareness;Decreased knowledge of use of DME or AE;Cardiopulmonary status limiting activity  OT Plan  OT Frequency (ACUTE ONLY) Min 3X/week  OT Treatment/Interventions (ACUTE ONLY) Self-care/ADL training;Therapeutic exercise;Neuromuscular education;Energy conservation;DME and/or AE instruction;Therapeutic activities;Cognitive remediation/compensation;Patient/family education;Balance training  AM-PAC OT "6 Clicks" Daily Activity Outcome Measure (Version 2)  Help from another person eating meals? 3  Help from another person taking care of personal grooming? 2  Help from another person toileting, which includes using toliet, bedpan, or urinal? 1  Help from another person bathing (including washing,  rinsing, drying)? 2  Help from another person to put on and taking off regular upper body clothing? 2  Help from another person to put on and taking off regular lower body clothing? 1  6 Click Score 11  OT Recommendation  Follow Up Recommendations Home health OT;Supervision/Assistance - 24 hour (pending progress)  OT Equipment Wheelchair (measurements OT);Wheelchair cushion (measurements OT)  Individuals Consulted  Consulted and Agree with Results and Recommendations Patient;Family member/caregiver  Family Member Consulted daughter Stanton Kidney)  Acute Rehab OT Goals  Patient Stated Goal to feel better  OT Goal Formulation With patient/family  Time For Goal Achievement 08/05/18  Potential to Achieve Goals Good  OT Time Calculation  OT Start Time (ACUTE ONLY) 0916  OT Stop Time (ACUTE ONLY) 0955  OT Time Calculation (min) 39 min  OT General Charges  $OT Visit 1 Visit  OT Evaluation  $OT Eval Moderate Complexity 1 Mod  OT Treatments  $Self Care/Home Management  23-37 mins  Written Expression  Dominant Hand Right (uses L since breaking her arm 2 years ago)  Maurie Boettcher, OT/L   Acute OT Clinical Specialist [place] Pager [phone] Office [phone]

## 2019-07-21 NOTE — TOC Initial Note (Signed)
Transition of Care [facility]) - Initial/Assessment Note    Patient Details  Name: [name] MRN: [id number] Date of Birth: 08-26-1925  [facility]) CM/SW Contact:    Shade Flood, LCSW Phone Number: 07/21/2019, 2:34 PM  Clinical Narrative:                 Pt admitted from home. She lives with her daughter and has 24/7 caregivers. Before becoming ill, pt used a RW for ambulation.   OT note recommending home HH OT at dc. Anticipating pt will need PT as well.   DC timeframe not yet known. TOC will follow and continue to assess and assist with dc planning.  Expected Discharge Plan: [place] Barriers to Discharge: Continued Medical Work up   Patient Goals and CMS Choice        Expected Discharge Plan and Services Expected Discharge Plan: [place]       Living arrangements for the past 2 months: Single Family Home                                      Prior Living Arrangements/Services Living arrangements for the past 2 months: Single Family Home Lives with:: Adult Children Patient language and need for interpreter reviewed:: Yes Do you feel safe going back to the place where you live?: Yes      Need for Family Participation in Patient Care: Yes (Comment) Care giver support system in place?: Yes (comment) Current home services: DME Criminal Activity/Legal Involvement Pertinent to Current Situation/Hospitalization: No - Comment as needed  Activities of Daily Living Home Assistive Devices/Equipment: Walker (specify type), Eyeglasses ADL Screening (condition at time of admission) Patient's cognitive ability adequate to safely complete daily activities?: No Is the patient deaf or have difficulty hearing?: Yes Does the patient have difficulty seeing, even when wearing glasses/contacts?: No Does the patient have difficulty concentrating, remembering, or making decisions?: Yes Patient able to express need for  assistance with ADLs?: Yes Does the patient have difficulty dressing or bathing?: Yes Independently performs ADLs?: No Communication: Independent Dressing (OT): Needs assistance Is this a change from baseline?: Pre-admission baseline Grooming: Needs assistance Is this a change from baseline?: Pre-admission baseline Feeding: Independent Bathing: Needs assistance Is this a change from baseline?: Pre-admission baseline Toileting: Needs assistance Is this a change from baseline?: Pre-admission baseline In/Out Bed: Needs assistance Is this a change from baseline?: Pre-admission baseline Walks in Home: Independent with device (comment) Does the patient have difficulty walking or climbing stairs?: Yes Weakness of Legs: Both Weakness of Arms/Hands: None  Permission Sought/Granted                  Emotional Assessment       Orientation: : Oriented to Self Alcohol / Substance Use: Not Applicable Psych Involvement: No (comment)  Admission diagnosis:  Cough [R05] Hypokalemia [E87.6] Hypoxia [R09.02] COVID-19 [U07.1] Patient Active Problem List   Diagnosis Date Noted  . COVID-19 07/20/2019  . Acute lower UTI 07/20/2019  . QT prolongation 07/20/2019  . Bradycardia 07/20/2019  . Memory loss 02/18/2019  . Orthostatic hypotension 09/01/2018  . Hypokalemia 05/19/2018  . Chronic a-fib ([place]) 11/18/2017  . Deficiency anemia 11/18/2017  . Pancytopenia, acquired ([place]) 05/13/2017  . Hypertension 01/30/2017  . GERD (gastroesophageal reflux disease) 01/30/2017  . Monoclonal gammopathy of unknown significance (MGUS) 01/30/2017  . Chest pain 01/30/2017  .  Follicular (B cell) lymphoma ([place]) 01/30/2017  . Peripheral neuropathy 01/30/2017  . Essential tremor 01/30/2017  . CKD (chronic kidney disease) stage 3, GFR 30-59 ml/min (HCC) 01/30/2017  . Depression 01/30/2017  . DJD (degenerative joint disease) 01/30/2017  . COPD (chronic obstructive pulmonary disease) ([place]) 01/30/2017  . 68mm  right ?? Lung nodule seen on imaging study 01/30/2017  . Hypomagnesemia 01/30/2017  . Elevated troponin   . Full body hives 05/02/2016  . MGUS (monoclonal gammopathy of unknown significance) 10/24/2015  . Intermittent vertigo 09/26/2014  . Systolic hypertension 09/38/1829  . History of B-cell lymphoma 12/11/2011  . Lymphadenopathy 10/25/2011   PCP:  Gaynelle Arabian, MD Pharmacy:   [facility] [place], [place] AT [address] [place] [address] Phone: [phone] Fax: [phone]     Social Determinants of Health (SDOH) Interventions    Readmission Risk Interventions Readmission Risk Prevention Plan 07/21/2019  Transportation Screening Complete  Medication Review (RN Care Manager) Complete  Some recent data might be hidden

## 2019-07-21 NOTE — Progress Notes (Signed)
Spoke to [place] from lab team. She had me reorder the lab that was already ordered at 0500 this AM. Says that existing order was from the ED before she was admitted here.

## 2019-07-21 NOTE — Progress Notes (Signed)
[name]  HDQ:[id number] DOB: 21-May-1926 DOA: 07/19/2019 PCP: Gaynelle Arabian, MD    Brief Narrative:  85 year old with a history of chronic atrial fibrillation, COPD, GERD, depression, and dementia who was diagnosed with Covid 12/17 and underwent Bamlanivimab infusion and was transported to the emergency room 12/28 with generalized weakness and very poor appetite.  In the ED she was afebrile but hypertensive with sats 94% on room air and no apparent infiltrate on CXR.  Significant Events: 12/17 tested positive for Covid 12/23 monoclonal antibody infusion 12/28 admit to [facility] via ED 12/29 transfer to [facility]  COVID-19 specific Treatment: 12/23 bamlanivimab  Antimicrobials:  Rocephin  Subjective: Patient is sitting up in a bedside chair.  She appears comfortable.  She denies chest pain nausea or vomiting.  She is mildly confused but interactive.  She complains of severe thirst and asked for ice water.  Assessment & Plan:  SARS-CoV-2 infection Not presently hypoxic therefore steroid not being dosed -clinically stable from this standpoint therefore remdesivir not being dosed yet either -monitor closely  E. coli UTI Likely the actual etiology of her admission symptoms -proven to be susceptible to ceftriaxone -continue directed antibiotic therapy -clinically appears to be stabilizing  Uncontrolled HTN Blood pressure quite variable likely related to episodic confusion/agitation -follow without change for now  Hypokalemia Due to poor oral intake -supplement and follow -magnesium is normal  Prolonged QTC Likely due to electrolyte abnormalities -follow-up in a.m.  Chronic atrial fibrillation with bradycardia Heart rate stable in the 50-60 range with sinus bradycardia presently -blood pressure not deleteriously affected  CKD stage III Creatinine has been improving with volume resuscitation and is now normal  Normocytic anemia Check anemia panel in  a.m.  GERD  Dementia  DVT prophylaxis: Eliquis Code Status: FULL CODE Family Communication:  Disposition Plan: Telemetry bed -begin PT/OT  Consultants:  none  Objective: Blood pressure (!) 163/64, pulse (!) 48, temperature 97.9 F (36.6 C), temperature source Oral, resp. rate 18, height 5\' 3"  (1.6 m), weight 40.8 kg, SpO2 95 %.  Intake/Output Summary (Last 24 hours) at 07/21/2019 0949 Last data filed at 07/20/2019 1427 Gross per 24 hour  Intake 200 ml  Output 800 ml  Net -600 ml   Filed Weights   07/19/19 1246  Weight: 40.8 kg    Examination: General: No acute respiratory distress Lungs: Clear to auscultation bilaterally without wheezes or crackles Cardiovascular:  bradycardic but regular with no murmur Abdomen: Nontender, nondistended, soft, bowel sounds positive, no rebound, no ascites, no appreciable mass Extremities: No significant cyanosis, clubbing, or edema bilateral lower extremities    CBC: Recent Labs  Lab 07/19/19 1409 07/21/19 0050  WBC 5.8 5.7  NEUTROABS 4.2 4.4  HGB 11.0* 10.1*  HCT 33.0* 30.7*  MCV 96.5 95.6  PLT 171 119   Basic Metabolic Panel: Recent Labs  Lab 07/19/19 1754 07/20/19 0500 07/20/19 1200 07/20/19 1634 07/21/19 0050  NA 139 140  --  137 136  K 2.7* 2.7*  --  3.6 3.2*  CL 98 99  --  97* 99  CO2 29 24  --  24 24  GLUCOSE 82 88  --  103* 109*  BUN 37* 29*  --  30* 40*  CREATININE 1.35* 1.12*  --  1.11* 1.00  CALCIUM 8.9 8.7*  --  8.8* 8.2*  MG 1.3*  --  1.2*  --  2.4  PHOS  --   --   --   --  2.8  GFR: Estimated Creatinine Clearance: 23.6 mL/min (by C-G formula based on SCr of 1 mg/dL).  Liver Function Tests: Recent Labs  Lab 07/19/19 1409 07/21/19 0050  AST 21 19  ALT 17 12  ALKPHOS 54 50  BILITOT 1.3* 1.5*  PROT 6.5 6.3*  ALBUMIN 3.1* 2.9*   Recent Labs  Lab 07/19/19 1409  LIPASE 29     Recent Results (from the past 240 hour(s))  Urine culture     Status: Abnormal (Preliminary result)    Collection Time: 07/19/19  7:39 PM   Specimen: Urine, Clean Catch  Result Value Ref Range Status   Specimen Description URINE, CLEAN CATCH  Final   Special Requests NONE  Final   Culture (A)  Final    50,000 COLONIES/mL GRAM NEGATIVE RODS IDENTIFICATION AND SUSCEPTIBILITIES TO FOLLOW Performed at [facility], [place] [address] [place], [place] [address]    Report Status PENDING  Incomplete     Scheduled Meds: . amiodarone  100 mg Oral Daily  . amLODipine  10 mg Oral Daily  . apixaban  2.5 mg Oral BID  . benzonatate  200 mg Oral TID  . donepezil  10 mg Oral QHS  . guaiFENesin  1,200 mg Oral BID  . hydrALAZINE  50 mg Oral Q8H  . isosorbide dinitrate  10 mg Oral TID  . loratadine  10 mg Oral Daily  . losartan  100 mg Oral Daily  . sertraline  100 mg Oral Daily  . traZODone  50 mg Oral QHS   Continuous Infusions: . 0.9 % NaCl with KCl 40 mEq / L 75 mL/hr (07/21/19 0657)  . cefTRIAXone (ROCEPHIN)  IV 1 g (07/21/19 0852)     LOS: 1 day   Cherene Altes, MD [facility] Office  [phone] Pager - Text Page per Amion  If 7PM-7AM, please contact night-coverage per Amion 07/21/2019, 9:49 AM

## 2019-07-21 NOTE — Progress Notes (Signed)
Initial Nutrition Assessment  DOCUMENTATION CODES:   Underweight  INTERVENTION:   Diet advancement as tolerated to Regular  Ensure Enlive po TID, each supplement provides 350 kcal and 20 grams of protein  Pt receiving Hormel Shake daily with Breakfast which provides 520 kcals and 22 g of protein and Magic cup BID with lunch and dinner, each supplement provides 290 kcal and 9 grams of protein, automatically on meal trays to optimize nutritional intake.   Add MVI with Minerals   NUTRITION DIAGNOSIS:   Inadequate oral intake related to catabolic illness, nausea, decreased appetite(altered/no taste) as evidenced by per patient/family report.  GOAL:   Patient will meet greater than or equal to 90% of their needs  MONITOR:   PO intake, Supplement acceptance, Labs, Weight trends  REASON FOR ASSESSMENT:   Malnutrition Screening Tool    ASSESSMENT:   85 yo female admitted with generalized weakness and decreased appetite with recent COVID-19 positive diagnosis. PMH includes dementia, COPD, GERD, depression  RD working remotely.  12/17 Diagnosed with COVID-19 12/29 Admit   Pt reports she can't taste anything and not eating anything. Pt also reporting Nausea and dry heaves  Currently on FL diet. No recorded po intake  Suspect pt meets criteria for malnutrition give poor po intake and underweight status, however not enough information available at this time.   Labs: potassium 3.2 (L), Creatinine wdl, phosphorus wdl, magnesium wdl Meds: NS with KCl at 75 ml/hr, KCL   Diet Order:   Diet Order            Diet full liquid Room service appropriate? Yes; Fluid consistency: Thin  Diet effective now              EDUCATION NEEDS:   Not appropriate for education at this time  Skin:  Skin Assessment: Reviewed RN Assessment  Last BM:  12/28  Height:   Ht Readings from Last 1 Encounters:  07/19/19 5\' 3"  (1.6 m)    Weight:   Wt Readings from Last 1 Encounters:   07/19/19 40.8 kg    Ideal Body Weight:  52.3 kg  BMI:  Body mass index is 15.94 kg/m.  Estimated Nutritional Needs:   Kcal:  1400-1600 kcals  Protein:  70-80 g  Fluid:  >/= 1.4 L  Cate Latandra Loureiro MS, RDN, LDN, CNSC [phone] Pager  [phone] Weekend/On-Call Pager

## 2019-07-21 NOTE — Plan of Care (Signed)
  Problem: Education: Goal: Knowledge of risk factors and measures for prevention of condition will improve Outcome: Progressing   Problem: Coping: Goal: Psychosocial and spiritual needs will be supported Outcome: Progressing   Problem: Respiratory: Goal: Will maintain a patent airway Outcome: Progressing Goal: Complications related to the disease process, condition or treatment will be avoided or minimized Outcome: Progressing   Problem: Clinical Measurements: Goal: Will remain free from infection Outcome: Progressing Goal: Diagnostic test results will improve Outcome: Progressing Goal: Respiratory complications will improve Outcome: Progressing   Problem: Nutrition: Goal: Adequate nutrition will be maintained Outcome: Progressing   Problem: Elimination: Goal: Will not experience complications related to bowel motility Outcome: Progressing Goal: Will not experience complications related to urinary retention Outcome: Progressing   Problem: Safety: Goal: Ability to remain free from injury will improve Outcome: Progressing

## 2019-07-22 LAB — PHOSPHORUS: Phosphorus: 1.6 mg/dL — ABNORMAL LOW (ref 2.5–4.6)

## 2019-07-22 LAB — COMPREHENSIVE METABOLIC PANEL
ALT: 17 U/L (ref 0–44)
AST: 24 U/L (ref 15–41)
Albumin: 2.7 g/dL — ABNORMAL LOW (ref 3.5–5.0)
Alkaline Phosphatase: 46 U/L (ref 38–126)
Anion gap: 9 (ref 5–15)
BUN: 39 mg/dL — ABNORMAL HIGH (ref 8–23)
CO2: 24 mmol/L (ref 22–32)
Calcium: 8.3 mg/dL — ABNORMAL LOW (ref 8.9–10.3)
Chloride: 104 mmol/L (ref 98–111)
Creatinine, Ser: 0.91 mg/dL (ref 0.44–1.00)
GFR calc Af Amer: >60 mL/min (ref >60)
GFR calc non Af Amer: 55 mL/min — ABNORMAL LOW (ref >60)
Glucose, Bld: 100 mg/dL — ABNORMAL HIGH (ref 70–99)
Potassium: 4.1 mmol/L (ref 3.5–5.1)
Sodium: 137 mmol/L (ref 135–145)
Total Bilirubin: 1 mg/dL (ref 0.3–1.2)
Total Protein: 5.9 g/dL — ABNORMAL LOW (ref 6.5–8.1)

## 2019-07-22 LAB — CBC WITH DIFFERENTIAL/PLATELET
Abs Immature Granulocytes: 0.03 10*3/uL (ref 0.00–0.07)
Basophils Absolute: 0 10*3/uL (ref 0.0–0.1)
Basophils Relative: 0 %
Eosinophils Absolute: 0 10*3/uL (ref 0.0–0.5)
Eosinophils Relative: 0 %
HCT: 29.5 % — ABNORMAL LOW (ref 36.0–46.0)
Hemoglobin: 9.8 g/dL — ABNORMAL LOW (ref 12.0–15.0)
Immature Granulocytes: 0 %
Lymphocytes Relative: 16 %
Lymphs Abs: 1.1 10*3/uL (ref 0.7–4.0)
MCH: 31.7 pg (ref 26.0–34.0)
MCHC: 33.2 g/dL (ref 30.0–36.0)
MCV: 95.5 fL (ref 80.0–100.0)
Monocytes Absolute: 0.6 10*3/uL (ref 0.1–1.0)
Monocytes Relative: 8 %
Neutro Abs: 5.5 10*3/uL (ref 1.7–7.7)
Neutrophils Relative %: 76 %
Platelets: 236 10*3/uL (ref 150–400)
RBC: 3.09 MIL/uL — ABNORMAL LOW (ref 3.87–5.11)
RDW: 13.3 % (ref 11.5–15.5)
WBC: 7.2 10*3/uL (ref 4.0–10.5)
nRBC: 0 % (ref 0.0–0.2)

## 2019-07-22 LAB — RETICULOCYTES
Immature Retic Fract: 17.8 % — ABNORMAL HIGH (ref 2.3–15.9)
RBC.: 3.17 MIL/uL — ABNORMAL LOW (ref 3.87–5.11)
Retic Count, Absolute: 51.7 10*3/uL (ref 19.0–186.0)
Retic Ct Pct: 1.6 % (ref 0.4–3.1)

## 2019-07-22 LAB — MAGNESIUM: Magnesium: 2.1 mg/dL (ref 1.7–2.4)

## 2019-07-22 LAB — FOLATE: Folate: 9.8 ng/mL (ref >5.9)

## 2019-07-22 LAB — VITAMIN B12: Vitamin B-12: 1135 pg/mL — ABNORMAL HIGH (ref 180–914)

## 2019-07-22 LAB — D-DIMER, QUANTITATIVE: D-Dimer, Quant: <0.27 ug/mL-FEU (ref 0.00–0.50)

## 2019-07-22 LAB — IRON AND TIBC
Iron: 37 ug/dL (ref 28–170)
Saturation Ratios: 20 % (ref 10.4–31.8)
TIBC: 189 ug/dL — ABNORMAL LOW (ref 250–450)
UIBC: 152 ug/dL

## 2019-07-22 LAB — FERRITIN: Ferritin: 190 ng/mL (ref 11–307)

## 2019-07-22 LAB — C-REACTIVE PROTEIN: CRP: 1.1 mg/dL — ABNORMAL HIGH (ref <1.0)

## 2019-07-22 MED ORDER — GUAIFENESIN ER 600 MG PO TB12
1200.0000 mg | ORAL_TABLET | Freq: Two times a day (BID) | ORAL | Status: DC | PRN
  Administered 2019-07-22: 1200 mg via ORAL
  Filled 2019-07-22: qty 2

## 2019-07-22 MED ORDER — BENZONATATE 100 MG PO CAPS: 200.0000 mg | ORAL_CAPSULE | Freq: Three times a day (TID) | ORAL | Status: DC | PRN

## 2019-07-22 NOTE — Evaluation (Signed)
Clinical/Bedside Swallow Evaluation Patient Details  Name: [name] MRN: [id number] Date of Birth: 02-Sep-1925  Today's Date: 07/22/2019 Time: SLP Start Time (ACUTE ONLY): 4315 SLP Stop Time (ACUTE ONLY): 0931 SLP Time Calculation (min) (ACUTE ONLY): 7 min  Past Medical History:  Past Medical History:  Diagnosis Date  . Allergy   . Cancer ([place])    follicular lymphoma  . Chronic atrial fibrillation ([place]) 11/18/2017  . Chronic kidney disease   . COPD (chronic obstructive pulmonary disease) ([place])   . Depression   . DJD (degenerative joint disease)   . Dysuria 09/25/2015  . Essential tremor   . GERD (gastroesophageal reflux disease)   . Heart murmur    had echo 20 yr ago-not sure where  . Hx of colonic polyp 10/02/05  . Hypertension   . MGUS (monoclonal gammopathy of unknown significance) 10/24/2015  . MVP (mitral valve prolapse)   . Orthostatic hypotension   . Osteoporosis   . Paroxysmal atrial fibrillation ([facility])   . Venous insufficiency    Past Surgical History:  Past Surgical History:  Procedure Laterality Date  . APPENDECTOMY    . CATARACT EXTRACTION Bilateral   . COLONOSCOPY    . DILATION AND CURETTAGE OF UTERUS     several  . OVARIAN CYST REMOVAL    . SKIN BIOPSY Right 03/09/2019   FACE AND LEG   HPI:  [name] is a 85 y.o. female with medical history significant of recent covid positive illness on 12/17, chronic atrial fibrillation, COPD, GERD, depression, dementia admitted for generalized weakness, decreased appetite. CXR No acute abnormality of the lungs   Assessment / Plan / Recommendation Clinical Impression  Pt was alert and consuming meds crushed in grits as SLP arrived. Also consumed multiple straw sips protein drink, water and graham cracker. Both oral and pharyngeal phases from subjective view were unremarkable. Suspect esophageal component given effortful swallow with cracker and extending trunk and eructation. She affirmed intermittent globus  sensation and has GERD. She independently removed remnanats from buccal cavities. She appears safe to upgrade from full liquids to Dys 3 texture, continue thin and pills crushed in puree. She is on room air and demonstrated adequate swallow/respiratory coordination. No follow up needed at this time.   SLP Visit Diagnosis: Dysphagia, unspecified (R13.10)    Aspiration Risk  Mild aspiration risk    Diet Recommendation Dysphagia 3 (Mech soft);Thin liquid   Liquid Administration via: Cup;Straw Medication Administration: Crushed with puree Compensations: Slow rate;Small sips/bites;Lingual sweep for clearance of pocketing Postural Changes: Seated upright at 90 degrees    Other  Recommendations Oral Care Recommendations: Oral care BID   Follow up Recommendations        Frequency and Duration            Prognosis        Swallow Study   General HPI: [name] is a 85 y.o. female with medical history significant of recent covid positive illness on 12/17, chronic atrial fibrillation, COPD, GERD, depression, dementia admitted for generalized weakness, decreased appetite. CXR No acute abnormality of the lungs Type of Study: Bedside Swallow Evaluation Previous Swallow Assessment: (none) Diet Prior to this Study: Thin liquids;Other (Comment)(full liquids) Temperature Spikes Noted: No Respiratory Status: Room air History of Recent Intubation: No Behavior/Cognition: Alert;Cooperative;Pleasant mood Oral Cavity Assessment: Within Functional Limits Oral Care Completed by SLP: No Oral Cavity - Dentition: Adequate natural dentition Vision: Functional for self-feeding Self-Feeding Abilities: Able to feed self Patient Positioning: Upright in bed  Baseline Vocal Quality: Normal Volitional Cough: Strong Volitional Swallow: Able to elicit    Oral/Motor/Sensory Function Overall Oral Motor/Sensory Function: Within functional limits   Ice Chips Ice chips: Not tested   Thin Liquid Thin Liquid:  Within functional limits Presentation: Straw    Nectar Thick Nectar Thick Liquid: Impaired   Honey Thick Honey Thick Liquid: Not tested   Puree Puree: Within functional limits   Solid     Solid: Within functional limits      Houston Siren 07/22/2019,9:40 AM  Orbie Pyo Colvin Caroli.Ed Risk analyst [phone] Office [phone]

## 2019-07-22 NOTE — Progress Notes (Addendum)
[name]  ACZ:[id number] DOB: December 24, 1925 DOA: 07/19/2019 PCP: Gaynelle Arabian, MD    Brief Narrative:  85 year old with a history of chronic atrial fibrillation, COPD, GERD, depression, and dementia who was diagnosed with Covid 12/17 and underwent Bamlanivimab infusion and was transported to the emergency room 12/28 with generalized weakness and very poor appetite.  In the ED she was afebrile but hypertensive with sats 94% on room air and no apparent infiltrate on CXR.  Significant Events: 12/17 tested positive for Covid 12/23 monoclonal antibody infusion 12/28 admit to [facility] via ED 12/29 transfer to [facility]  COVID-19 specific Treatment: 12/23 bamlanivimab  Antimicrobials:  Rocephin 12/29 >  Subjective: Saturations remain in the 94-95% range on room air.  Blood pressure trend improved overall.  The patient is resting comfortably in bed with no complaints today.  She is confused but pleasant and interactive.  Assessment & Plan:  E. coli UTI Likely the actual etiology of her admission symptoms -proven to be susceptible to ceftriaxone -continue directed antibiotic therapy -clinically appears to be stabilizing w/ no fever   SARS-CoV-2 infection Not presently hypoxic therefore steroid not being dosed -clinically stable from this standpoint therefore remdesivir not being dosed yet either - monitor closely - clinically appears to be more of an incidental finding at this time   Recent Labs  Lab 07/19/19 1409 07/20/19 1200 07/21/19 0050 07/22/19 0222  DDIMER  --  0.38 0.42 <0.27  FERRITIN  --  171 197 190  CRP  --  2.7* 2.4* 1.1*  ALT 17  --  12 17    Uncontrolled HTN Blood pressure quite variable likely related to episodic confusion/agitation - follow without change for now  Hypokalemia Corrected with supplementation  Prolonged QTC Likely due to electrolyte abnormalities - repeat twelve-lead EKG in a.m.  Chronic atrial fibrillation with  bradycardia Heart rate stable in the 50-60 range with sinus bradycardia presently -blood pressure stable   CKD stage III Creatinine normalized w/ volume expansion   Recent Labs  Lab 07/19/19 1754 07/20/19 0500 07/20/19 1634 07/21/19 0050 07/22/19 0222  CREATININE 1.35* 1.12* 1.11* 1.00 0.91    Normocytic anemia Anemia panel most consistent with anemia of chronic disease/poor nutritional state  GERD  Dementia  DVT prophylaxis: Eliquis Code Status: FULL CODE Family Communication:  Disposition Plan: med/surg bed - begin PT/OT - anticipate stability for d/c in 24-48hrs   Consultants:  none  Objective: Blood pressure (!) 158/69, pulse 62, temperature (!) 97.1 F (36.2 C), temperature source Axillary, resp. rate 18, height 5\' 3"  (1.6 m), weight 40.8 kg, SpO2 95 %.  Intake/Output Summary (Last 24 hours) at 07/22/2019 1003 Last data filed at 07/22/2019 0659 Gross per 24 hour  Intake 1490 ml  Output 100 ml  Net 1390 ml   Filed Weights   07/19/19 1246  Weight: 40.8 kg    Examination: General: No acute respiratory distress Lungs: Clear to auscultation bilaterally  Cardiovascular:  bradycardic - no M or rub  Abdomen: thin, soft, bs+, no mass  Extremities: No C/C/E B LE     CBC: Recent Labs  Lab 07/19/19 1409 07/21/19 0050 07/22/19 0222  WBC 5.8 5.7 7.2  NEUTROABS 4.2 4.4 5.5  HGB 11.0* 10.1* 9.8*  HCT 33.0* 30.7* 29.5*  MCV 96.5 95.6 95.5  PLT 171 229 109   Basic Metabolic Panel: Recent Labs  Lab 07/20/19 1200 07/20/19 1634 07/21/19 0050 07/22/19 0222  NA  --  137 136 137  K  --  3.6 3.2* 4.1  CL  --  97* 99 104  CO2  --  24 24 24   GLUCOSE  --  103* 109* 100*  BUN  --  30* 40* 39*  CREATININE  --  1.11* 1.00 0.91  CALCIUM  --  8.8* 8.2* 8.3*  MG 1.2*  --  2.4 2.1  PHOS  --   --  2.8 1.6*   GFR: Estimated Creatinine Clearance: 25.9 mL/min (by C-G formula based on SCr of 0.91 mg/dL).  Liver Function Tests: Recent Labs  Lab 07/19/19 1409  07/21/19 0050 07/22/19 0222  AST 21 19 24   ALT 17 12 17   ALKPHOS 54 50 46  BILITOT 1.3* 1.5* 1.0  PROT 6.5 6.3* 5.9*  ALBUMIN 3.1* 2.9* 2.7*   Recent Labs  Lab 07/19/19 1409  LIPASE 29     Recent Results (from the past 240 hour(s))  Urine culture     Status: Abnormal   Collection Time: 07/19/19  7:39 PM   Specimen: Urine, Clean Catch  Result Value Ref Range Status   Specimen Description URINE, CLEAN CATCH  Final   Special Requests   Final    NONE Performed at [facility], [place] [address], [place], [place] [address]    Culture 50,000 COLONIES/mL ESCHERICHIA COLI (A)  Final   Report Status 07/21/2019 FINAL  Final   Organism ID, Bacteria ESCHERICHIA COLI (A)  Final      Susceptibility   Escherichia coli - MIC*    AMPICILLIN <=2 SENSITIVE Sensitive     CEFAZOLIN <=4 SENSITIVE Sensitive     CEFTRIAXONE <=0.25 SENSITIVE Sensitive     CIPROFLOXACIN <=0.25 SENSITIVE Sensitive     GENTAMICIN <=1 SENSITIVE Sensitive     IMIPENEM 0.5 SENSITIVE Sensitive     NITROFURANTOIN <=16 SENSITIVE Sensitive     TRIMETH/SULFA <=20 SENSITIVE Sensitive     AMPICILLIN/SULBACTAM <=2 SENSITIVE Sensitive     * 50,000 COLONIES/mL ESCHERICHIA COLI     Scheduled Meds: . amiodarone  100 mg Oral Daily  . amLODipine  10 mg Oral Daily  . apixaban  2.5 mg Oral BID  . benzonatate  200 mg Oral TID  . donepezil  10 mg Oral QHS  . feeding supplement (ENSURE ENLIVE)  237 mL Oral TID BM  . guaiFENesin  1,200 mg Oral BID  . hydrALAZINE  50 mg Oral Q8H  . isosorbide dinitrate  10 mg Oral TID  . loratadine  10 mg Oral Daily  . losartan  100 mg Oral Daily  . multivitamin with minerals  1 tablet Oral Daily  . sertraline  100 mg Oral Daily  . traZODone  50 mg Oral QHS   Continuous Infusions: . 0.9 % NaCl with KCl 40 mEq / L 50 mL/hr (07/22/19 0112)  . cefTRIAXone (ROCEPHIN)  IV 1 g (07/22/19 0914)     LOS: 2 days   Cherene Altes, MD [facility] Office  [phone] Pager - Text  Page per Amion  If 7PM-7AM, please contact night-coverage per Amion 07/22/2019, 10:03 AM

## 2019-07-22 NOTE — Progress Notes (Signed)
Physical Therapy Treatment Patient Details Name: [name] MRN: [id number] DOB: April 15, 1926 Today's Date: 07/22/2019    History of Present Illness 85 y.o. female with medical history significant of recent covid positive illness on 12/17 s/p Bamlanivimab infusion, chronic atrial fibrillation, COPD, GERD, depression, dementia, was brought in for generalized weakness, decreased appetite.    PT Comments    Pt tolerated tx well was able to completed most tasks given. She did sem slightly confused especially when asked hx questions or asked about needs. She was able to complete bed mob and transfers with  Mod-max a and cues this pm.    Follow Up Recommendations  Home health PT;Supervision/Assistance - 24 hour     Equipment Recommendations       Recommendations for Other Services       Precautions / Restrictions Precautions Precautions: Fall Restrictions Weight Bearing Restrictions: No    Mobility  Bed Mobility Overal bed mobility: Needs Assistance Bed Mobility: Supine to Sit;Rolling Rolling: Mod assist   Supine to sit: Mod assist        Transfers Overall transfer level: Needs assistance   Transfers: Sit to/from Stand;Stand Pivot Transfers Sit to Stand: Mod assist;Max assist Stand pivot transfers: Mod assist;Max assist          Ambulation/Gait             General Gait Details: took few pivotal steps from bed to recliner   Stairs             Wheelchair Mobility    Modified Rankin (Stroke Patients Only)       Balance Overall balance assessment: Needs assistance Sitting-balance support: Feet supported Sitting balance-Leahy Scale: Fair     Standing balance support: During functional activity Standing balance-Leahy Scale: Poor                              Cognition Arousal/Alertness: Awake/alert Behavior During Therapy: WFL for tasks assessed/performed Overall Cognitive Status: Impaired/Different from baseline                                         Exercises      General Comments        Pertinent Vitals/Pain Pain Assessment: Faces Faces Pain Scale: Hurts little more Pain Location: general discomfort w/ mobility Pain Descriptors / Indicators: Discomfort Pain Intervention(s): Limited activity within patient's tolerance;Monitored during session    Home Living                      Prior Function            PT Goals (current goals can now be found in the care plan section) Acute Rehab PT Goals Patient Stated Goal: did not state PT Goal Formulation: With patient Time For Goal Achievement: 08/03/19 Potential to Achieve Goals: Good Progress towards PT goals: Progressing toward goals    Frequency    Min 3X/week      PT Plan Current plan remains appropriate    Co-evaluation              AM-PAC PT "6 Clicks" Mobility   Outcome Measure  Help needed turning from your back to your side while in a flat bed without using bedrails?: A Little Help needed moving from lying on your back to sitting on the side of a flat  bed without using bedrails?: A Lot Help needed moving to and from a bed to a chair (including a wheelchair)?: A Lot Help needed standing up from a chair using your arms (e.g., wheelchair or bedside chair)?: A Lot Help needed to walk in hospital room?: A Lot Help needed climbing 3-5 steps with a railing? : Total 6 Click Score: 12    End of Session   Activity Tolerance: Treatment limited secondary to medical complications (Comment);Patient limited by fatigue Patient left: in chair;with call bell/phone within reach;with chair alarm set Nurse Communication: Mobility status;Other (comment)(post tx disposition) PT Visit Diagnosis: Muscle weakness (generalized) (M62.81)     Time: 2162-4469 PT Time Calculation (min) (ACUTE ONLY): 17 min  Charges:  $Therapeutic Activity: 8-22 mins                     Horald Chestnut, PT    Delford Field 07/22/2019, 3:48 PM

## 2019-07-22 NOTE — Progress Notes (Signed)
Pt. Spoke with daughter on facetime tonight.

## 2019-07-23 MED ORDER — TRAMADOL HCL 50 MG PO TABS
50.0000 mg | ORAL_TABLET | Freq: Four times a day (QID) | ORAL | Status: DC | PRN
  Administered 2019-07-23: 19:00:00 50 mg via ORAL
  Filled 2019-07-23: qty 1

## 2019-07-23 MED ORDER — ACETAMINOPHEN 325 MG PO TABS
650.0000 mg | ORAL_TABLET | Freq: Four times a day (QID) | ORAL | Status: DC | PRN
  Administered 2019-07-23 – 2019-07-24 (×2): 650 mg via ORAL
  Filled 2019-07-23 (×2): qty 2

## 2019-07-23 NOTE — Progress Notes (Signed)
[name]  KTG:[id number] DOB: 1926/02/23 DOA: 07/19/2019 PCP: Gaynelle Arabian, MD    Brief Narrative:  86 year old with a history of chronic atrial fibrillation, COPD, GERD, depression, and dementia who was diagnosed with Covid 12/17 and underwent Bamlanivimab infusion and was transported to the emergency room 12/28 with generalized weakness and very poor appetite.  In the ED she was afebrile but hypertensive with sats 94% on room air and no apparent infiltrate on CXR.  Significant Events: 12/17 tested positive for Covid 12/23 monoclonal antibody infusion 12/28 admit to [facility] via ED 12/29 transfer to [facility]  COVID-19 specific Treatment: 12/23 bamlanivimab  Antimicrobials:  Rocephin 12/29 >  Subjective: Oxygen saturations are stable on room air.  Blood pressure has stabilized and patient is not tachycardic.  No fever.  Resting comfortably at the time of my visit.  Assessment & Plan:  E. coli UTI Likely the actual etiology of her admission symptoms -proven to be susceptible to ceftriaxone -continue directed antibiotic therapy -clinically appears to be stabilizing w/ no fever   SARS-CoV-2 infection Not presently hypoxic therefore steroid not being dosed -clinically stable from this standpoint therefore remdesivir not being dosed yet either - monitor closely - clinically appears to be more of an incidental finding at this time   Recent Labs  Lab 07/19/19 1409 07/20/19 1200 07/21/19 0050 07/22/19 0222  DDIMER  --  0.38 0.42 <0.27  FERRITIN  --  171 197 190  CRP  --  2.7* 2.4* 1.1*  ALT 17  --  12 17    Uncontrolled HTN Blood pressure quite variable likely related to episodic confusion/agitation -stable within acceptable range at this time  Hypokalemia Corrected with supplementation  Prolonged QTC Likely due to electrolyte abnormalities - repeat twelve-lead EKG today notes QT of 460  Chronic atrial fibrillation with bradycardia Heart rate stable    CKD stage III Creatinine normalized w/ volume expansion   Recent Labs  Lab 07/19/19 1754 07/20/19 0500 07/20/19 1634 07/21/19 0050 07/22/19 0222  CREATININE 1.35* 1.12* 1.11* 1.00 0.91    Normocytic anemia Anemia panel most consistent with anemia of chronic disease/poor nutritional state  GERD  Dementia  DVT prophylaxis: Eliquis Code Status: FULL CODE Family Communication:  Disposition Plan: med/surg bed - begin PT/OT - anticipate stability for d/c 07/24/2019  Consultants:  none  Objective: Blood pressure 136/73, pulse 73, temperature (!) 97.3 F (36.3 C), temperature source Axillary, resp. rate (!) 21, height 5\' 3"  (1.6 m), weight 40.8 kg, SpO2 93 %.  Intake/Output Summary (Last 24 hours) at 07/23/2019 1003 Last data filed at 07/23/2019 0615 Gross per 24 hour  Intake 640 ml  Output 100 ml  Net 540 ml   Filed Weights   07/19/19 1246  Weight: 40.8 kg    Examination: General: No acute respiratory distress Lungs: Clear to auscultation bilaterally  Cardiovascular:  RRR - no M or rub  Abdomen: thin, soft, bs+, no mass  Extremities: No C/C/E B LE     CBC: Recent Labs  Lab 07/19/19 1409 07/21/19 0050 07/22/19 0222  WBC 5.8 5.7 7.2  NEUTROABS 4.2 4.4 5.5  HGB 11.0* 10.1* 9.8*  HCT 33.0* 30.7* 29.5*  MCV 96.5 95.6 95.5  PLT 171 229 428   Basic Metabolic Panel: Recent Labs  Lab 07/20/19 1200 07/20/19 1634 07/21/19 0050 07/22/19 0222  NA  --  137 136 137  K  --  3.6 3.2* 4.1  CL  --  97* 99 104  CO2  --  24 24 24   GLUCOSE  --  103* 109* 100*  BUN  --  30* 40* 39*  CREATININE  --  1.11* 1.00 0.91  CALCIUM  --  8.8* 8.2* 8.3*  MG 1.2*  --  2.4 2.1  PHOS  --   --  2.8 1.6*   GFR: Estimated Creatinine Clearance: 25.9 mL/min (by C-G formula based on SCr of 0.91 mg/dL).  Liver Function Tests: Recent Labs  Lab 07/19/19 1409 07/21/19 0050 07/22/19 0222  AST 21 19 24   ALT 17 12 17   ALKPHOS 54 50 46  BILITOT 1.3* 1.5* 1.0  PROT 6.5 6.3* 5.9*   ALBUMIN 3.1* 2.9* 2.7*   Recent Labs  Lab 07/19/19 1409  LIPASE 29     Recent Results (from the past 240 hour(s))  Urine culture     Status: Abnormal   Collection Time: 07/19/19  7:39 PM   Specimen: Urine, Clean Catch  Result Value Ref Range Status   Specimen Description URINE, CLEAN CATCH  Final   Special Requests   Final    NONE Performed at [facility], [place] [address], [place], [place] [address]    Culture 50,000 COLONIES/mL ESCHERICHIA COLI (A)  Final   Report Status 07/21/2019 FINAL  Final   Organism ID, Bacteria ESCHERICHIA COLI (A)  Final      Susceptibility   Escherichia coli - MIC*    AMPICILLIN <=2 SENSITIVE Sensitive     CEFAZOLIN <=4 SENSITIVE Sensitive     CEFTRIAXONE <=0.25 SENSITIVE Sensitive     CIPROFLOXACIN <=0.25 SENSITIVE Sensitive     GENTAMICIN <=1 SENSITIVE Sensitive     IMIPENEM 0.5 SENSITIVE Sensitive     NITROFURANTOIN <=16 SENSITIVE Sensitive     TRIMETH/SULFA <=20 SENSITIVE Sensitive     AMPICILLIN/SULBACTAM <=2 SENSITIVE Sensitive     * 50,000 COLONIES/mL ESCHERICHIA COLI     Scheduled Meds: . amiodarone  100 mg Oral Daily  . amLODipine  10 mg Oral Daily  . apixaban  2.5 mg Oral BID  . donepezil  10 mg Oral QHS  . feeding supplement (ENSURE ENLIVE)  237 mL Oral TID BM  . hydrALAZINE  50 mg Oral Q8H  . isosorbide dinitrate  10 mg Oral TID  . loratadine  10 mg Oral Daily  . losartan  100 mg Oral Daily  . multivitamin with minerals  1 tablet Oral Daily  . sertraline  100 mg Oral Daily  . traZODone  50 mg Oral QHS   Continuous Infusions: . cefTRIAXone (ROCEPHIN)  IV 1 g (07/23/19 1700)     LOS: 3 days   Cherene Altes, MD [facility] Office  [phone] Pager - Text Page per Amion  If 7PM-7AM, please contact night-coverage per Amion 07/23/2019, 10:03 AM

## 2019-07-24 LAB — COMPREHENSIVE METABOLIC PANEL
ALT: 23 U/L (ref 0–44)
AST: 31 U/L (ref 15–41)
Albumin: 2.5 g/dL — ABNORMAL LOW (ref 3.5–5.0)
Alkaline Phosphatase: 59 U/L (ref 38–126)
Anion gap: 7 (ref 5–15)
BUN: 26 mg/dL — ABNORMAL HIGH (ref 8–23)
CO2: 26 mmol/L (ref 22–32)
Calcium: 8.8 mg/dL — ABNORMAL LOW (ref 8.9–10.3)
Chloride: 103 mmol/L (ref 98–111)
Creatinine, Ser: 0.83 mg/dL (ref 0.44–1.00)
GFR calc Af Amer: >60 mL/min (ref >60)
GFR calc non Af Amer: >60 mL/min (ref >60)
Glucose, Bld: 92 mg/dL (ref 70–99)
Potassium: 3.9 mmol/L (ref 3.5–5.1)
Sodium: 136 mmol/L (ref 135–145)
Total Bilirubin: 0.7 mg/dL (ref 0.3–1.2)
Total Protein: 5.5 g/dL — ABNORMAL LOW (ref 6.5–8.1)

## 2019-07-24 LAB — CBC
HCT: 28.9 % — ABNORMAL LOW (ref 36.0–46.0)
Hemoglobin: 9.2 g/dL — ABNORMAL LOW (ref 12.0–15.0)
MCH: 31.2 pg (ref 26.0–34.0)
MCHC: 31.8 g/dL (ref 30.0–36.0)
MCV: 98 fL (ref 80.0–100.0)
Platelets: 164 10*3/uL (ref 150–400)
RBC: 2.95 MIL/uL — ABNORMAL LOW (ref 3.87–5.11)
RDW: 13.8 % (ref 11.5–15.5)
WBC: 3.4 10*3/uL — ABNORMAL LOW (ref 4.0–10.5)
nRBC: 0 % (ref 0.0–0.2)

## 2019-07-24 LAB — MAGNESIUM: Magnesium: 1.6 mg/dL — ABNORMAL LOW (ref 1.7–2.4)

## 2019-07-24 MED ORDER — MAGNESIUM OXIDE 400 (241.3 MG) MG PO TABS: 400.0000 mg | ORAL_TABLET | Freq: Every day | ORAL | 0 refills | Status: DC

## 2019-07-24 MED ORDER — CEFDINIR 300 MG PO CAPS
300.0000 mg | ORAL_CAPSULE | ORAL | Status: DC
  Filled 2019-07-24: qty 1

## 2019-07-24 MED ORDER — MAGNESIUM OXIDE 400 (241.3 MG) MG PO TABS
400.0000 mg | ORAL_TABLET | Freq: Every day | ORAL | Status: DC
  Administered 2019-07-24: 12:00:00 400 mg via ORAL
  Filled 2019-07-24: qty 1

## 2019-07-24 MED ORDER — BENZONATATE 100 MG PO CAPS: 200.0000 mg | ORAL_CAPSULE | Freq: Three times a day (TID) | ORAL | 0 refills | Status: DC | PRN

## 2019-07-24 MED ORDER — CEFDINIR 300 MG PO CAPS: 300.0000 mg | ORAL_CAPSULE | Freq: Two times a day (BID) | ORAL | 0 refills | Status: DC

## 2019-07-24 MED ORDER — MAGNESIUM SULFATE 2 GM/50ML IV SOLN
2.0000 g | Freq: Once | INTRAVENOUS | Status: AC
  Administered 2019-07-24: 2 g via INTRAVENOUS
  Filled 2019-07-24: qty 50

## 2019-07-24 NOTE — Discharge Summary (Signed)
DISCHARGE SUMMARY  [name]  MR#: [id number]  DOB:Feb 12, 1926  Date of Admission: 07/19/2019 Date of Discharge: 07/24/2019  Attending Physician:Korbin Notaro Hennie Duos, MD  Patient's GMW:[id number], Herbie Baltimore, MD  Consults: none   Disposition: D/C home   Date of Positive COVID Test: 07/08/2019   Date Quarantine Ends: 07/22/2019 - met criteria for mild/mod disease   COVID-19 specific Treatment: 12/23 bamlanivimab  Follow-up Appts: Follow-up Information    Gaynelle Arabian, MD Follow up in 1 week(s).   Specialty: Family Medicine Contact information: [address], [address] [place] [address] [phone]           Tests Needing Follow-up: -f/u BP control -f/u Mg level - supplementation initiated during this admission   Discharge Diagnoses: E. coli UTI SARS-CoV-2 infection Uncontrolled HTN Hypokalemia Prolonged QTC Chronic atrial fibrillation with bradycardia CKD stage III Normocytic anemia GERD Dementia  Initial presentation: 86 year old with a history of chronic atrial fibrillation, COPD, GERD, depression, and dementia who was diagnosed with Covid 12/17 and underwent bamlanivimab infusion. She was transported to the emergency room 12/28 with generalized weakness and very poor appetite.  In the ED she was afebrile but hypertensive with sats 94% on room air and no apparent infiltrate on CXR.  Hospital Course: 12/17 tested positive for Covid 12/23 monoclonal antibody infusion 12/28 admit to [facility] via ED 12/29 transfer to [facility]  E. coli UTI Likely the actual etiology of her admission symptoms -proven to be susceptible to ceftriaxone -completed 5 days of directed antibiotic therapy - clinically appears to be stable at time of d/c - will provide another 2 days of abx coverage at time of d/c to complete a 7 day course   SARS-CoV-2 infection CXR was without evidence of an infiltrate -patient was not hypoxic therefore steroid was not  utilized - clinically stable with no evidence of an active pulmonary infection therefore remdesivir was not utilized- monitored closely - clinically appears to have been more of an incidental finding at this time   Uncontrolled HTN Blood pressure quite variable likely related to episodic confusion/agitation -stable within acceptable range at time of d/c   Hypokalemia Corrected with supplementation  Prolonged QTC Likely due to electrolyte abnormalities - repeat twelve-lead EKG 07/23/2019 noted QT of 460  Chronic atrial fibrillation with bradycardia Heart rate stable at time of d/c   CKD stage III Creatinine normalized w/ volume expansion and stable at time of d/c   Normocytic anemia Anemia panel most consistent with anemia of chronic disease/poor nutritional state  GERD  Dementia  Allergies as of 07/24/2019      Reactions   Ibuprofen Nausea And Vomiting, Palpitations   Latex Hives   Peanut-containing Drug Products Itching, Swelling   Lips    Strawberry C [ascorbate]    Heartburn   Sulindac    Ineffective for arthritis   Amoxicillin Diarrhea, Nausea And Vomiting   Aspirin Nausea And Vomiting, Other (See Comments)   Heartburn,  Indigestion.   Benadryl [diphenhydramine Hcl] Nausea And Vomiting   Penicillins Rash   Did it involve swelling of the face/tongue/throat, SOB, or low BP? Yes Did it involve sudden or severe rash/hives, skin peeling, or any reaction on the inside of your mouth or nose? No Did you need to seek medical attention at a hospital or doctor's office? NO When did it last happen?20 years ago If all above answers are "NO", may proceed with cephalosporin use.   Sulfa Antibiotics Itching, Nausea And Vomiting      Medication  List    STOP taking these medications   hydrochlorothiazide 12.5 MG capsule Commonly known as: MICROZIDE     TAKE these medications   acetaminophen 500 MG tablet Commonly known as: TYLENOL Take 1-2 tablets (500-1,000 mg total) by  mouth every 6 (six) hours as needed for moderate pain.   amiodarone 200 MG tablet Commonly known as: PACERONE TAKE 1/2 TABLET BY MOUTH ONCE DAILY   apixaban 2.5 MG Tabs tablet Commonly known as: ELIQUIS Take 1 tablet (2.5 mg total) by mouth 2 (two) times daily.   benzonatate 100 MG capsule Commonly known as: TESSALON Take 2 capsules (200 mg total) by mouth 3 (three) times daily as needed for cough. What changed:   when to take this  reasons to take this   cefdinir 300 MG capsule Commonly known as: OMNICEF Take 1 capsule (300 mg total) by mouth every 12 (twelve) hours. Start taking on: July 25, 2019   clindamycin 150 MG capsule Commonly known as: CLEOCIN Take 4 capsules by mouth as needed. As needed for dental procedures   [employer] Dry Eye Relief 0.2-0.2-1 % Soln Generic drug: Glycerin-Hypromellose-PEG 400 Place 1 drop into both eyes as needed (dry eye).   dextromethorphan-guaiFENesin 30-600 MG 12hr tablet Commonly known as: MUCINEX DM Take 1 tablet by mouth daily as needed (sinus).   donepezil 10 MG tablet Commonly known as: ARICEPT Take 1 tablet (10 mg total) by mouth at bedtime.   famotidine 20 MG tablet Commonly known as: PEPCID Take 20 mg by mouth daily as needed for heartburn or indigestion.   fluticasone 50 MCG/ACT nasal spray Commonly known as: FLONASE Place 2 sprays into the nose daily as needed for allergies.   hyoscyamine 0.125 MG SL tablet Commonly known as: LEVSIN SL Place 0.375 mg under the tongue 2 (two) times daily.   Imodium A-D 2 MG tablet Generic drug: loperamide Take 2 mg by mouth 4 (four) times daily as needed for diarrhea or loose stools.   losartan 100 MG tablet Commonly known as: COZAAR Take 100 mg by mouth daily.   magnesium oxide 400 (241.3 Mg) MG tablet Commonly known as: MAG-OX Take 1 tablet (400 mg total) by mouth daily.   meclizine 50 MG tablet Commonly known as: ANTIVERT Take 0.5 tablets (25 mg total) by mouth 3 (three)  times daily as needed. What changed:   how much to take  reasons to take this   propranolol ER 120 MG 24 hr capsule Commonly known as: INDERAL LA Take 1 capsule (120 mg total) by mouth 2 (two) times daily.   sertraline 100 MG tablet Commonly known as: ZOLOFT Take 100 mg by mouth daily.   traZODone 50 MG tablet Commonly known as: DESYREL Take 50 mg by mouth at bedtime.       Day of Discharge BP (!) 161/86 (BP Location: Left Arm)   Pulse 70   Temp 97.7 F (36.5 C) (Oral)   Resp 17   Ht '5\' 3"'  (1.6 m)   Wt 40.8 kg   SpO2 96%   BMI 15.94 kg/m   Physical Exam: General: No acute respiratory distress Lungs: Clear to auscultation bilaterally without wheezes or crackles Cardiovascular: Regular rate and rhythm without murmur gallop or rub normal S1 and S2 Abdomen: Nontender, nondistended, soft, bowel sounds positive, no rebound, no ascites, no appreciable mass Extremities: No significant cyanosis, clubbing, or edema bilateral lower extremities  Basic Metabolic Panel: Recent Labs  Lab 07/19/19 1754 07/20/19 0500 07/20/19 1200 07/20/19 1634 07/21/19 0050  07/22/19 0222 07/24/19 0542  NA 139 140  --  137 136 137 136  K 2.7* 2.7*  --  3.6 3.2* 4.1 3.9  CL 98 99  --  97* 99 104 103  CO2 29 24  --  '24 24 24 26  ' GLUCOSE 82 88  --  103* 109* 100* 92  BUN 37* 29*  --  30* 40* 39* 26*  CREATININE 1.35* 1.12*  --  1.11* 1.00 0.91 0.83  CALCIUM 8.9 8.7*  --  8.8* 8.2* 8.3* 8.8*  MG 1.3*  --  1.2*  --  2.4 2.1 1.6*  PHOS  --   --   --   --  2.8 1.6*  --     Liver Function Tests: Recent Labs  Lab 07/19/19 1409 07/21/19 0050 07/22/19 0222 07/24/19 0542  AST '21 19 24 31  ' ALT '17 12 17 23  ' ALKPHOS 54 50 46 59  BILITOT 1.3* 1.5* 1.0 0.7  PROT 6.5 6.3* 5.9* 5.5*  ALBUMIN 3.1* 2.9* 2.7* 2.5*   Recent Labs  Lab 07/19/19 1409  LIPASE 29    CBC: Recent Labs  Lab 07/19/19 1409 07/21/19 0050 07/22/19 0222 07/24/19 0542  WBC 5.8 5.7 7.2 3.4*  NEUTROABS 4.2 4.4 5.5   --   HGB 11.0* 10.1* 9.8* 9.2*  HCT 33.0* 30.7* 29.5* 28.9*  MCV 96.5 95.6 95.5 98.0  PLT 171 229 236 164     Recent Results (from the past 240 hour(s))  Urine culture     Status: Abnormal   Collection Time: 07/19/19  7:39 PM   Specimen: Urine, Clean Catch  Result Value Ref Range Status   Specimen Description URINE, CLEAN CATCH  Final   Special Requests   Final    NONE Performed at [facility], [address], [place], [place] [address]    Culture 50,000 COLONIES/mL ESCHERICHIA COLI (A)  Final   Report Status 07/21/2019 FINAL  Final   Organism ID, Bacteria ESCHERICHIA COLI (A)  Final      Susceptibility   Escherichia coli - MIC*    AMPICILLIN <=2 SENSITIVE Sensitive     CEFAZOLIN <=4 SENSITIVE Sensitive     CEFTRIAXONE <=0.25 SENSITIVE Sensitive     CIPROFLOXACIN <=0.25 SENSITIVE Sensitive     GENTAMICIN <=1 SENSITIVE Sensitive     IMIPENEM 0.5 SENSITIVE Sensitive     NITROFURANTOIN <=16 SENSITIVE Sensitive     TRIMETH/SULFA <=20 SENSITIVE Sensitive     AMPICILLIN/SULBACTAM <=2 SENSITIVE Sensitive     * 50,000 COLONIES/mL ESCHERICHIA COLI     Time spent in discharge (includes decision making & examination of pt): 35 minutes  07/24/2019, 11:33 AM   Cherene Altes, MD [facility] Office  [phone]

## 2019-07-24 NOTE — Discharge Instructions (Signed)
Date of Positive COVID Test: 07/08/2019   Date Quarantine Ends: 07/22/2019 - met criteria for mild/mod disease    Urinary Tract Infection, Adult  A urinary tract infection (UTI) is an infection of any part of the urinary tract. The urinary tract includes the kidneys, ureters, bladder, and urethra. These organs make, store, and get rid of urine in the body. Your health care provider may use other names to describe the infection. An upper UTI affects the ureters and kidneys (pyelonephritis). A lower UTI affects the bladder (cystitis) and urethra (urethritis). What are the causes? Most urinary tract infections are caused by bacteria in your genital area, around the entrance to your urinary tract (urethra). These bacteria grow and cause inflammation of your urinary tract. What increases the risk? You are more likely to develop this condition if:  You have a urinary catheter that stays in place (indwelling).  You are not able to control when you urinate or have a bowel movement (you have incontinence).  You are female and you: ? Use a spermicide or diaphragm for birth control. ? Have low estrogen levels. ? Are pregnant.  You have certain genes that increase your risk (genetics).  You are sexually active.  You take antibiotic medicines.  You have a condition that causes your flow of urine to slow down, such as: ? An enlarged prostate, if you are female. ? Blockage in your urethra (stricture). ? A kidney stone. ? A nerve condition that affects your bladder control (neurogenic bladder). ? Not getting enough to drink, or not urinating often.  You have certain medical conditions, such as: ? Diabetes. ? A weak disease-fighting system (immunesystem). ? Sickle cell disease. ? Gout. ? Spinal cord injury. What are the signs or symptoms? Symptoms of this condition include:  Needing to urinate right away (urgently).  Frequent urination or passing small amounts of urine  frequently.  Pain or burning with urination.  Blood in the urine.  Urine that smells bad or unusual.  Trouble urinating.  Cloudy urine.  Vaginal discharge, if you are female.  Pain in the abdomen or the lower back. You may also have:  Vomiting or a decreased appetite.  Confusion.  Irritability or tiredness.  A fever.  Diarrhea. The first symptom in older adults may be confusion. In some cases, they may not have any symptoms until the infection has worsened. How is this diagnosed? This condition is diagnosed based on your medical history and a physical exam. You may also have other tests, including:  Urine tests.  Blood tests.  Tests for sexually transmitted infections (STIs). If you have had more than one UTI, a cystoscopy or imaging studies may be done to determine the cause of the infections. How is this treated? Treatment for this condition includes:  Antibiotic medicine.  Over-the-counter medicines to treat discomfort.  Drinking enough water to stay hydrated. If you have frequent infections or have other conditions such as a kidney stone, you may need to see a health care provider who specializes in the urinary tract (urologist). In rare cases, urinary tract infections can cause sepsis. Sepsis is a life-threatening condition that occurs when the body responds to an infection. Sepsis is treated in the hospital with IV antibiotics, fluids, and other medicines. Follow these instructions at home:  Medicines  Take over-the-counter and prescription medicines only as told by your health care provider.  If you were prescribed an antibiotic medicine, take it as told by your health care provider. Do not stop using  the antibiotic even if you start to feel better. General instructions  Make sure you: ? Empty your bladder often and completely. Do not hold urine for long periods of time. ? Empty your bladder after sex. ? Wipe from front to back after a bowel movement  if you are female. Use each tissue one time when you wipe.  Drink enough fluid to keep your urine pale yellow.  Keep all follow-up visits as told by your health care provider. This is important. Contact a health care provider if:  Your symptoms do not get better after 1-2 days.  Your symptoms go away and then return. Get help right away if you have:  Severe pain in your back or your lower abdomen.  A fever.  Nausea or vomiting. Summary  A urinary tract infection (UTI) is an infection of any part of the urinary tract, which includes the kidneys, ureters, bladder, and urethra.  Most urinary tract infections are caused by bacteria in your genital area, around the entrance to your urinary tract (urethra).  Treatment for this condition often includes antibiotic medicines.  If you were prescribed an antibiotic medicine, take it as told by your health care provider. Do not stop using the antibiotic even if you start to feel better.  Keep all follow-up visits as told by your health care provider. This is important. This information is not intended to replace advice given to you by your health care provider. Make sure you discuss any questions you have with your health care provider. Document Revised: 06/25/2018 Document Reviewed: 01/15/2018 Elsevier Patient Education  2020 [place].     COVID-19 COVID-19 is a respiratory infection that is caused by a virus called severe acute respiratory syndrome coronavirus 2 (SARS-CoV-2). The disease is also known as coronavirus disease or novel coronavirus. In some people, the virus may not cause any symptoms. In others, it may cause a serious infection. The infection can get worse quickly and can lead to complications, such as:  Pneumonia, or infection of the lungs.  Acute respiratory distress syndrome or ARDS. This is a condition in which fluid build-up in the lungs prevents the lungs from filling with air and passing oxygen into the  blood.  Acute respiratory failure. This is a condition in which there is not enough oxygen passing from the lungs to the body or when carbon dioxide is not passing from the lungs out of the body.  Sepsis or septic shock. This is a serious bodily reaction to an infection.  Blood clotting problems.  Secondary infections due to bacteria or fungus.  Organ failure. This is when your body's organs stop working. The virus that causes COVID-19 is contagious. This means that it can spread from person to person through droplets from coughs and sneezes (respiratory secretions). What are the causes? This illness is caused by a virus. You may catch the virus by:  Breathing in droplets from an infected person. Droplets can be spread by a person breathing, speaking, singing, coughing, or sneezing.  Touching something, like a table or a doorknob, that was exposed to the virus (contaminated) and then touching your mouth, nose, or eyes. What increases the risk? Risk for infection You are more likely to be infected with this virus if you:  Are within 6 feet (2 meters) of a person with COVID-19.  Provide care for or live with a person who is infected with COVID-19.  Spend time in crowded indoor spaces or live in shared housing. Risk for serious  illness You are more likely to become seriously ill from the virus if you:  Are 53 years of age or older. The higher your age, the more you are at risk for serious illness.  Live in a nursing home or long-term care facility.  Have cancer.  Have a long-term (chronic) disease such as: ? Chronic lung disease, including chronic obstructive pulmonary disease or asthma. ? A long-term disease that lowers your body's ability to fight infection (immunocompromised). ? Heart disease, including heart failure, a condition in which the arteries that lead to the heart become narrow or blocked (coronary artery disease), a disease which makes the heart muscle thick, weak,  or stiff (cardiomyopathy). ? Diabetes. ? Chronic kidney disease. ? Sickle cell disease, a condition in which red blood cells have an abnormal "sickle" shape. ? Liver disease.  Are obese. What are the signs or symptoms? Symptoms of this condition can range from mild to severe. Symptoms may appear any time from 2 to 14 days after being exposed to the virus. They include:  A fever or chills.  A cough.  Difficulty breathing.  Headaches, body aches, or muscle aches.  Runny or stuffy (congested) nose.  A sore throat.  New loss of taste or smell. Some people may also have stomach problems, such as nausea, vomiting, or diarrhea. Other people may not have any symptoms of COVID-19. How is this diagnosed? This condition may be diagnosed based on:  Your signs and symptoms, especially if: ? You live in an area with a COVID-19 outbreak. ? You recently traveled to or from an area where the virus is common. ? You provide care for or live with a person who was diagnosed with COVID-19. ? You were exposed to a person who was diagnosed with COVID-19.  A physical exam.  Lab tests, which may include: ? Taking a sample of fluid from the back of your nose and throat (nasopharyngeal fluid), your nose, or your throat using a swab. ? A sample of mucus from your lungs (sputum). ? Blood tests.  Imaging tests, which may include, X-rays, CT scan, or ultrasound. How is this treated? At present, there is no medicine to treat COVID-19. Medicines that treat other diseases are being used on a trial basis to see if they are effective against COVID-19. Your health care provider will talk with you about ways to treat your symptoms. For most people, the infection is mild and can be managed at home with rest, fluids, and over-the-counter medicines. Treatment for a serious infection usually takes places in a hospital intensive care unit (ICU). It may include one or more of the following treatments. These  treatments are given until your symptoms improve.  Receiving fluids and medicines through an IV.  Supplemental oxygen. Extra oxygen is given through a tube in the nose, a face mask, or a hood.  Positioning you to lie on your stomach (prone position). This makes it easier for oxygen to get into the lungs.  Continuous positive airway pressure (CPAP) or bi-level positive airway pressure (BPAP) machine. This treatment uses mild air pressure to keep the airways open. A tube that is connected to a motor delivers oxygen to the body.  Ventilator. This treatment moves air into and out of the lungs by using a tube that is placed in your windpipe.  Tracheostomy. This is a procedure to create a hole in the neck so that a breathing tube can be inserted.  Extracorporeal membrane oxygenation (ECMO). This procedure gives the lungs  a chance to recover by taking over the functions of the heart and lungs. It supplies oxygen to the body and removes carbon dioxide. Follow these instructions at home: Lifestyle  If you are sick, stay home except to get medical care. Your health care provider will tell you how long to stay home. Call your health care provider before you go for medical care.  Rest at home as told by your health care provider.  Do not use any products that contain nicotine or tobacco, such as cigarettes, e-cigarettes, and chewing tobacco. If you need help quitting, ask your health care provider.  Return to your normal activities as told by your health care provider. Ask your health care provider what activities are safe for you. General instructions  Take over-the-counter and prescription medicines only as told by your health care provider.  Drink enough fluid to keep your urine pale yellow.  Keep all follow-up visits as told by your health care provider. This is important. How is this prevented?  There is no vaccine to help prevent COVID-19 infection. However, there are steps you can take  to protect yourself and others from this virus. To protect yourself:   Do not travel to areas where COVID-19 is a risk. The areas where COVID-19 is reported change often. To identify high-risk areas and travel restrictions, check the CDC travel website: FatFares.com.br  If you live in, or must travel to, an area where COVID-19 is a risk, take precautions to avoid infection. ? Stay away from people who are sick. ? Wash your hands often with soap and water for 20 seconds. If soap and water are not available, use an alcohol-based hand sanitizer. ? Avoid touching your mouth, face, eyes, or nose. ? Avoid going out in public, follow guidance from your state and local health authorities. ? If you must go out in public, wear a cloth face covering or face mask. Make sure your mask covers your nose and mouth. ? Avoid crowded indoor spaces. Stay at least 6 feet (2 meters) away from others. ? Disinfect objects and surfaces that are frequently touched every day. This may include:  Counters and tables.  Doorknobs and light switches.  Sinks and faucets.  Electronics, such as phones, remote controls, keyboards, computers, and tablets. To protect others: If you have symptoms of COVID-19, take steps to prevent the virus from spreading to others.  If you think you have a COVID-19 infection, contact your health care provider right away. Tell your health care team that you think you may have a COVID-19 infection.  Stay home. Leave your house only to seek medical care. Do not use public transport.  Do not travel while you are sick.  Wash your hands often with soap and water for 20 seconds. If soap and water are not available, use alcohol-based hand sanitizer.  Stay away from other members of your household. Let healthy household members care for children and pets, if possible. If you have to care for children or pets, wash your hands often and wear a mask. If possible, stay in your own room,  separate from others. Use a different bathroom.  Make sure that all people in your household wash their hands well and often.  Cough or sneeze into a tissue or your sleeve or elbow. Do not cough or sneeze into your hand or into the air.  Wear a cloth face covering or face mask. Make sure your mask covers your nose and mouth. Where to find more information  Centers for Disease Control and Prevention: PurpleGadgets.be  World Health Organization: https://www.castaneda.info/ Contact a health care provider if:  You live in or have traveled to an area where COVID-19 is a risk and you have symptoms of the infection.  You have had contact with someone who has COVID-19 and you have symptoms of the infection. Get help right away if:  You have trouble breathing.  You have pain or pressure in your chest.  You have confusion.  You have bluish lips and fingernails.  You have difficulty waking from sleep.  You have symptoms that get worse. These symptoms may represent a serious problem that is an emergency. Do not wait to see if the symptoms will go away. Get medical help right away. Call your local emergency services (911 in the [place] Do not drive yourself to the hospital. Let the emergency medical personnel know if you think you have COVID-19. Summary  COVID-19 is a respiratory infection that is caused by a virus. It is also known as coronavirus disease or novel coronavirus. It can cause serious infections, such as pneumonia, acute respiratory distress syndrome, acute respiratory failure, or sepsis.  The virus that causes COVID-19 is contagious. This means that it can spread from person to person through droplets from breathing, speaking, singing, coughing, or sneezing.  You are more likely to develop a serious illness if you are 59 years of age or older, have a weak immune system, live in a nursing home, or have chronic disease.  There is no medicine  to treat COVID-19. Your health care provider will talk with you about ways to treat your symptoms.  Take steps to protect yourself and others from infection. Wash your hands often and disinfect objects and surfaces that are frequently touched every day. Stay away from people who are sick and wear a mask if you are sick. This information is not intended to replace advice given to you by your health care provider. Make sure you discuss any questions you have with your health care provider. Document Revised: 05/07/2019 Document Reviewed: 08/13/2018 Elsevier Patient Education  [place].

## 2019-07-24 NOTE — Progress Notes (Signed)
Physical Therapy Treatment Patient Details Name: [name] MRN: [id number] DOB: Nov 10, 1925 Today's Date: 07/24/2019    History of Present Illness 86 y.o. female with medical history significant of recent covid positive illness on 12/17 s/p Bamlanivimab infusion, chronic atrial fibrillation, COPD, GERD, depression, dementia, was brought in for generalized weakness, decreased appetite.    PT Comments    Pt needing som encouragement to attempt mobilization this am. She did fatigue very quckly but was able to maintain sats in 90s on room air throughout session. Able to complete supine to sit with stand by assist but needs mod a for sit<>stand and also to ambulate 25ft with RW. Pt also needed assistance with walker propagation and obstacle management.     Follow Up Recommendations  Home health PT;Supervision/Assistance - 24 hour     Equipment Recommendations       Recommendations for Other Services       Precautions / Restrictions Precautions Precautions: Fall Restrictions Weight Bearing Restrictions: No    Mobility  Bed Mobility Overal bed mobility: Needs Assistance Bed Mobility: Supine to Sit Rolling: Supervision         General bed mobility comments: needs great encouragement to complete   Transfers Overall transfer level: Needs assistance Equipment used: Rolling walker (2 wheeled) Transfers: Sit to/from Stand Sit to Stand: Mod assist            Ambulation/Gait Ambulation/Gait assistance: Mod assist Gait Distance (Feet): 30 Feet Assistive device: Rolling walker (2 wheeled) Gait Pattern/deviations: Step-to pattern;Shuffle Gait velocity: very slow   General Gait Details: needs assist with walker nagivation around obstacles, and encouragement to complete distance   Stairs             Wheelchair Mobility    Modified Rankin (Stroke Patients Only)       Balance Overall balance assessment: Needs assistance Sitting-balance support: Feet  supported Sitting balance-Leahy Scale: Fair     Standing balance support: During functional activity;Bilateral upper extremity supported Standing balance-Leahy Scale: Poor                              Cognition Arousal/Alertness: Lethargic Behavior During Therapy: WFL for tasks assessed/performed Overall Cognitive Status: No family/caregiver present to determine baseline cognitive functioning                                        Exercises      General Comments        Pertinent Vitals/Pain Pain Assessment: Faces Faces Pain Scale: Hurts little more Pain Location: general discomfort w/ mobility Pain Descriptors / Indicators: Discomfort Pain Intervention(s): Limited activity within patient's tolerance;Monitored during session    Home Living                      Prior Function            PT Goals (current goals can now be found in the care plan section) Acute Rehab PT Goals Patient Stated Goal: states that when she gets home all her family will be there to help her as needed Time For Goal Achievement: 08/03/19 Potential to Achieve Goals: Good Progress towards PT goals: Progressing toward goals    Frequency    Min 3X/week      PT Plan Current plan remains appropriate    Co-evaluation  AM-PAC PT "6 Clicks" Mobility   Outcome Measure  Help needed turning from your back to your side while in a flat bed without using bedrails?: A Little Help needed moving from lying on your back to sitting on the side of a flat bed without using bedrails?: A Little Help needed moving to and from a bed to a chair (including a wheelchair)?: A Lot Help needed standing up from a chair using your arms (e.g., wheelchair or bedside chair)?: A Lot Help needed to walk in hospital room?: A Lot Help needed climbing 3-5 steps with a railing? : Total 6 Click Score: 13    End of Session Equipment Utilized During Treatment: Gait  belt Activity Tolerance: Patient limited by fatigue;Patient limited by lethargy Patient left: in chair;with call bell/phone within reach Nurse Communication: Mobility status PT Visit Diagnosis: Muscle weakness (generalized) (M62.81)     Time: 1164-3539 PT Time Calculation (min) (ACUTE ONLY): 24 min  Charges:  $Gait Training: 8-22 mins $Therapeutic Activity: 8-22 mins                     Horald Chestnut, PT    Delford Field 07/24/2019, 12:08 PM

## 2019-07-24 NOTE — TOC Progression Note (Signed)
Transition of Care [facility]) - Progression Note    Patient Details  Name: [name] MRN: [id number] Date of Birth: [birth year]  Transition [facility]) CM/SW [place], [place] Phone Number: [phone] 07/24/2019, 1:07 PM  Clinical Narrative:     CSW spoke with patient's daughter Stanton Kidney regarding PT and OT home health recommendation, she reports she is in agreement and has used [facility] in the past and this is her preference.   CSW reached out to [facility] with [place], he is able to accept referral.   No DME needs identified, Stanton Kidney reports they have rolling walker at home.   Stanton Kidney would like to be informed when she can pick up patient, RN informed.   No further dc needs identified at this time.   Expected Discharge Plan: [place] Barriers to Discharge: Continued Medical Work up  Expected Discharge Plan and Services Expected Discharge Plan: [place] arrangements for the past 2 months: Single Family Home Expected Discharge Date: 07/24/19                                     Social Determinants of Health (SDOH) Interventions    Readmission Risk Interventions Readmission Risk Prevention Plan 07/21/2019  Transportation Screening Complete  Medication Review Press photographer) Complete  Some recent data might be hidden

## 2019-08-09 ENCOUNTER — Ambulatory Visit: Payer: Self-pay | Admitting: Podiatry

## 2019-08-12 ENCOUNTER — Other Ambulatory Visit: Payer: Self-pay

## 2019-08-12 ENCOUNTER — Encounter (HOSPITAL_BASED_OUTPATIENT_CLINIC_OR_DEPARTMENT_OTHER): Payer: Medicare Other | Admitting: Internal Medicine

## 2019-08-12 DIAGNOSIS — N186 End stage renal disease: Secondary | ICD-10-CM | POA: Diagnosis not present

## 2019-08-12 DIAGNOSIS — Z886 Allergy status to analgesic agent status: Secondary | ICD-10-CM | POA: Diagnosis not present

## 2019-08-12 DIAGNOSIS — Z8249 Family history of ischemic heart disease and other diseases of the circulatory system: Secondary | ICD-10-CM | POA: Insufficient documentation

## 2019-08-12 DIAGNOSIS — L97222 Non-pressure chronic ulcer of left calf with fat layer exposed: Secondary | ICD-10-CM | POA: Insufficient documentation

## 2019-08-12 DIAGNOSIS — Z86718 Personal history of other venous thrombosis and embolism: Secondary | ICD-10-CM | POA: Diagnosis not present

## 2019-08-12 DIAGNOSIS — Z882 Allergy status to sulfonamides status: Secondary | ICD-10-CM | POA: Insufficient documentation

## 2019-08-12 DIAGNOSIS — Z7901 Long term (current) use of anticoagulants: Secondary | ICD-10-CM | POA: Insufficient documentation

## 2019-08-12 DIAGNOSIS — Z881 Allergy status to other antibiotic agents status: Secondary | ICD-10-CM | POA: Diagnosis not present

## 2019-08-12 DIAGNOSIS — G309 Alzheimer's disease, unspecified: Secondary | ICD-10-CM | POA: Diagnosis not present

## 2019-08-12 DIAGNOSIS — F0280 Dementia in other diseases classified elsewhere without behavioral disturbance: Secondary | ICD-10-CM | POA: Insufficient documentation

## 2019-08-12 DIAGNOSIS — I120 Hypertensive chronic kidney disease with stage 5 chronic kidney disease or end stage renal disease: Secondary | ICD-10-CM | POA: Diagnosis not present

## 2019-08-12 DIAGNOSIS — I4891 Unspecified atrial fibrillation: Secondary | ICD-10-CM | POA: Diagnosis not present

## 2019-08-12 DIAGNOSIS — M069 Rheumatoid arthritis, unspecified: Secondary | ICD-10-CM | POA: Insufficient documentation

## 2019-08-12 DIAGNOSIS — Z880 Allergy status to penicillin: Secondary | ICD-10-CM | POA: Diagnosis not present

## 2019-08-12 DIAGNOSIS — Z8572 Personal history of non-Hodgkin lymphomas: Secondary | ICD-10-CM | POA: Insufficient documentation

## 2019-08-12 DIAGNOSIS — I87332 Chronic venous hypertension (idiopathic) with ulcer and inflammation of left lower extremity: Secondary | ICD-10-CM | POA: Diagnosis not present

## 2019-08-12 DIAGNOSIS — Z888 Allergy status to other drugs, medicaments and biological substances status: Secondary | ICD-10-CM | POA: Diagnosis not present

## 2019-08-12 DIAGNOSIS — K529 Noninfective gastroenteritis and colitis, unspecified: Secondary | ICD-10-CM | POA: Insufficient documentation

## 2019-08-12 DIAGNOSIS — J449 Chronic obstructive pulmonary disease, unspecified: Secondary | ICD-10-CM | POA: Insufficient documentation

## 2019-08-12 DIAGNOSIS — Z8616 Personal history of COVID-19: Secondary | ICD-10-CM | POA: Diagnosis not present

## 2019-08-12 DIAGNOSIS — Z91040 Latex allergy status: Secondary | ICD-10-CM | POA: Insufficient documentation

## 2019-08-12 NOTE — Progress Notes (Addendum)
[name] ([id number]) Visit Report for 08/12/2019 Allergy List Details Patient Name: Date of Service: [name], [name] 08/12/2019 9:00 AM Medical Record [id number] Patient Account Number: [id number] Date of Birth/Sex: Treating RN: [birth year] (86 y.o. [name] Primary Care Hikeem Andersson: Simona Huh Other Clinician: Referring Marin Milley: Treating Jacarra Bobak/Extender:Robson, [name], [name] in Treatment: 0 Allergies Active Allergies penicillin Sulfa antibiotics aspirin Benadryl amoxicillin ibuprofen latex Reaction: hives peanut Reaction: itching, swelling ascorbic acid Benadryl Reaction: N/V Allergy Notes Electronic Signature(s) Signed: 08/12/2019 6:15:07 PM By: Kela Millin Entered By: Kela Millin on 08/12/2019 09:37:28 -------------------------------------------------------------------------------- Arrival Information Details Patient Name: Date of Service: [name]. 08/12/2019 9:00 AM Medical Record [id number] Patient Account Number: [id number] Date of Birth/Sex: Treating RN: 1925-09-13 (86 y.o. [name] Primary Care Amy Belloso: Simona Huh Other Clinician: Referring Annalissa Murphey: Treating Harue Pribble/Extender:Robson, [name], [name] in Treatment: 0 Visit Information Patient Arrived: Wheel Chair Arrival Time: 09:30 Accompanied By: caregiver Transfer Assistance: EasyPivot Patient Lift Patient Identification Verified: Yes Secondary Verification Process Yes Completed: Patient Has Alerts: Yes Patient Alerts: Patient on Blood Thinner Bilateral legs: [place] History Since Last Visit Electronic Signature(s) Signed: 08/12/2019 6:15:07 PM By: Kela Millin Entered By: Kela Millin on 08/12/2019 09:31:16 -------------------------------------------------------------------------------- Clinic Level of Care Assessment Details Patient Name: Date of Service: [name], [name]  08/12/2019 9:00 AM Medical Record [id number] Patient Account Number: [id number] Date of Birth/Sex: Treating RN: [birth year] (86 y.o. [name] Primary Care Ariez Neilan: Simona Huh Other Clinician: Referring Brynley Cuddeback: Treating Silverio Hagan/Extender:Robson, [name], [name] in Treatment: 0 Clinic Level of Care Assessment Items TOOL 1 Quantity Score X - Use when EandM and Procedure is performed on INITIAL visit 1 0 ASSESSMENTS - Nursing Assessment / Reassessment X - General Physical Exam (combine w/ comprehensive assessment (listed just below) 1 20 when performed on new pt. evals) X - Comprehensive Assessment (HX, ROS, Risk Assessments, Wounds Hx, etc.) 1 25 ASSESSMENTS - Wound and Skin Assessment / Reassessment X - Dermatologic / Skin Assessment (not related to wound area) 1 10 ASSESSMENTS - Ostomy and/or Continence Assessment and Care '[]'  - Incontinence Assessment and Management 0 '[]'  - Ostomy Care Assessment and Management (repouching, etc.) 0 PROCESS - Coordination of Care X - Simple Patient / Family Education for ongoing care 1 15 '[]'  - Complex (extensive) Patient / Family Education for ongoing care 0 X - Staff obtains Programmer, systems, Records, Test Results / Process Orders 1 10 '[]'  - Staff telephones HHA, Nursing Homes / Clarify orders / etc 0 '[]'  - Routine Transfer to another Facility (non-emergent condition) 0 '[]'  - Routine Hospital Admission (non-emergent condition) 0 X - New Admissions / Biomedical engineer / Ordering NPWT, Apligraf, etc. 1 15 '[]'  - Emergency Hospital Admission (emergent condition) 0 PROCESS - Special Needs '[]'  - Pediatric / Minor Patient Management 0 '[]'  - Isolation Patient Management 0 '[]'  - Hearing / Language / Visual special needs 0 '[]'  - Assessment of Community assistance (transportation, D/C planning, etc.) 0 '[]'  - Additional assistance / Altered mentation 0 '[]'  - Support Surface(s) Assessment (bed, cushion, seat, etc.) 0 INTERVENTIONS -  Miscellaneous '[]'  - External ear exam 0 '[]'  - Patient Transfer (multiple staff / Civil Service fast streamer / Similar devices) 0 '[]'  - Simple Staple / Suture removal (25 or less) 0 '[]'  - Complex Staple / Suture removal (26 or more) 0 '[]'  - Hypo/Hyperglycemic Management (do not check if billed separately) 0 '[]'  - Ankle / Brachial Index (ABI) - do not check if billed separately  0 Has the patient been seen at the hospital within the last three years: Yes Total Score: 95 Level Of Care: New/Established - Level 3 Electronic Signature(s) Signed: 08/12/2019 6:20:01 PM By: Deon Pilling Entered By: Deon Pilling on 08/12/2019 10:08:02 -------------------------------------------------------------------------------- Encounter Discharge Information Details Patient Name: Date of Service: [name]. 08/12/2019 9:00 AM Medical Record [id number] Patient Account Number: [id number] Date of Birth/Sex: Treating RN: 10-Feb-1926 (86 y.o. [name] Primary Care Azula Zappia: Simona Huh Other Clinician: Referring Shahin Knierim: Treating Elexis Pollak/Extender:Robson, [name], [name] in Treatment: 0 Encounter Discharge Information Items Post Procedure Vitals Discharge Condition: Stable Temperature (F): 98.3 Ambulatory Status: Wheelchair Pulse (bpm): 49 Discharge Destination: Home Respiratory Rate (breaths/min): 18 Transportation: Private Auto Blood Pressure (mmHg): 161/69 Accompanied By: friend Schedule Follow-up Appointment: Yes Clinical Summary of Care: Patient Declined Electronic Signature(s) Signed: 08/12/2019 5:58:49 PM By: Baruch Gouty RN, BSN Entered By: Baruch Gouty on 08/12/2019 10:24:14 -------------------------------------------------------------------------------- Lower Extremity Assessment Details Patient Name: Date of Service: [name], [name] 08/12/2019 9:00 AM Medical Record [id number] Patient Account Number: [id number] Date of Birth/Sex: Treating RN: [birth year]  (86 y.o. [name] Primary Care Hareem Surowiec: Simona Huh Other Clinician: Referring Norine Reddington: Treating Arrick Dutton/Extender:Robson, [name], [name] in Treatment: 0 Edema Assessment Assessed: [Left: No] [Right: No] Edema: [Left: N] [Right: o] Calf Left: Right: Point of Measurement: 37 cm From Medial Instep 24 cm cm Ankle Left: Right: Point of Measurement: 15 cm From Medial Instep 18.2 cm cm Vascular Assessment Pulses: Dorsalis Pedis Palpable: [Left:Yes] Electronic Signature(s) Signed: 08/12/2019 6:15:07 PM By: Kela Millin Entered By: Kela Millin on 08/12/2019 09:45:47 -------------------------------------------------------------------------------- Multi Wound Chart Details Patient Name: Date of Service: [name]. 08/12/2019 9:00 AM Medical Record [id number] Patient Account Number: [id number] Date of Birth/Sex: Treating RN: [birth year] (86 y.o. F) Primary Care Beila Purdie: Simona Huh Other Clinician: Referring Kamel Haven: Treating Annastacia Duba/Extender:Robson, [name], [name] in Treatment: 0 Vital Signs Height(in): 63 Pulse(bpm): 13 Weight(lbs): 101 Blood Pressure(mmHg): 161/69 Body Mass Index(BMI): 18 Temperature(F): 98.3 Respiratory 19 Rate(breaths/min): Photos: [6:No Photos] [N/A:N/A] Wound Location: [6:Left Lower Leg - Lateral] [N/A:N/A] Wounding Event: [6:Gradually Appeared] [N/A:N/A] Primary Etiology: [6:Venous Leg Ulcer] [N/A:N/A] Comorbid History: [6:Cataracts, Middle ear problems, Anemia, Arrhythmia, Deep Vein Thrombosis, Hypertension, Colitis, End Stage Renal Disease, Rheumatoid Arthritis, Osteoarthritis, Received Chemotherapy] [N/A:N/A] Date Acquired: [6:06/22/2019] [N/A:N/A] Weeks of Treatment: [6:0] [N/A:N/A] Wound Status: [6:Open] [N/A:N/A] Measurements L x W x D 2.3x2.2x0.1 [N/A:N/A] (cm) Area (cm) : [6:3.974] [N/A:N/A] Volume (cm) : [6:0.397] [N/A:N/A] Classification: [6:Full  Thickness Without Exposed Support Structures] [N/A:N/A] Exudate Amount: [6:Small] [N/A:N/A] Exudate Type: [6:Sanguinous] [N/A:N/A] Exudate Color: [6:red] [N/A:N/A] Wound Margin: [6:Distinct, outline attached N/A] Granulation Amount: [6:Medium (34-66%)] [N/A:N/A] Granulation Quality: [6:Red, Pink] [N/A:N/A] Necrotic Amount: [6:Medium (34-66%)] [N/A:N/A] Exposed Structures: [6:Fat Layer (Subcutaneous Tissue) Exposed: Yes Fascia: No Tendon: No Muscle: No Joint: No Bone: No] [N/A:N/A] Epithelialization: [6:None] [N/A:N/A] Debridement: [6:Debridement - Excisional] [N/A:N/A] Pre-procedure [6:09:59] [N/A:N/A] Verification/Time Out Taken: Pain Control: [6:Lidocaine 4% Topical Solution] [N/A:N/A] Tissue Debrided: [6:Subcutaneous, Slough] [N/A:N/A] Level: [6:Skin/Subcutaneous Tissue] [N/A:N/A] Debridement Area (sq cm):5.06 [N/A:N/A] Instrument: [6:Curette] [N/A:N/A] Bleeding: [6:Minimum] [N/A:N/A] Hemostasis Achieved: [6:Pressure] [N/A:N/A] Procedural Pain: [6:0] [N/A:N/A] Post Procedural Pain: [6:3] [N/A:N/A] Debridement Treatment Procedure was tolerated [N/A:N/A] Response: [6:well] Post Debridement [6:2.3x2.2x0.1] [N/A:N/A] Measurements L x W x D (cm) Post Debridement [6:0.397] [N/A:N/A] Volume: (cm) Procedures Performed: Debridement [N/A:N/A] Treatment Notes Wound #6 (Left, Lateral Lower Leg) 2. Periwound Care Moisturizing lotion 3. Primary Dressing Applied Calcium Alginate Ag 4. Secondary Dressing ABD Pad Dry Gauze 6. Support  Layer Applied 3 layer compression wrap Electronic Signature(s) Signed: 08/12/2019 5:54:58 PM By: Linton Ham MD Entered By: Linton Ham on 08/12/2019 10:37:34 -------------------------------------------------------------------------------- Multi-Disciplinary Care Plan Details Patient Name: Date of Service: [name], [name] 08/12/2019 9:00 AM Medical Record [id number] Patient Account Number: [id number] Date of Birth/Sex: Treating  RN: Feb 24, 1926 (86 y.o. [name] Primary Care Corleen Otwell: Other Clinician: Simona Huh Referring Gisell Buehrle: Treating Luverne Zerkle/Extender:Robson, [name], [name] in Treatment: 0 Active Inactive Nutrition Nursing Diagnoses: Potential for alteratiion in Nutrition/Potential for imbalanced nutrition Goals: Patient/caregiver agrees to and verbalizes understanding of need to obtain nutritional consultation Date Initiated: 08/12/2019 Target Resolution Date: 08/27/2019 Goal Status: Active Interventions: Provide education on nutrition Treatment Activities: Patient referred to Primary Care Physician for further nutritional evaluation : 08/12/2019 Notes: Orientation to the Wound Care Program Nursing Diagnoses: Knowledge deficit related to the wound healing center program Goals: Patient/caregiver will verbalize understanding of the [facility] Date Initiated: 08/12/2019 Target Resolution Date: 08/27/2019 Goal Status: Active Interventions: Provide education on orientation to the wound center Notes: Pain, Acute or Chronic Nursing Diagnoses: Pain, acute or chronic: actual or potential Potential alteration in comfort, pain Goals: Patient will verbalize adequate pain control and receive pain control interventions during procedures as needed Date Initiated: 08/12/2019 Target Resolution Date: 08/27/2019 Goal Status: Active Patient/caregiver will verbalize comfort level met Date Initiated: 08/12/2019 Target Resolution Date: 08/27/2019 Goal Status: Active Interventions: Encourage patient to take pain medications as prescribed Provide education on pain management Reposition patient for comfort Treatment Activities: Administer pain control measures as ordered : 08/12/2019 Notes: Wound/Skin Impairment Nursing Diagnoses: Knowledge deficit related to ulceration/compromised skin integrity Goals: Patient/caregiver will verbalize understanding of skin care  regimen Date Initiated: 08/12/2019 Target Resolution Date: 08/27/2019 Goal Status: Active Ulcer/skin breakdown will heal within 14 weeks Date Initiated: 08/12/2019 Target Resolution Date: 10/29/2019 Goal Status: Active Interventions: Assess patient/caregiver ability to obtain necessary supplies Assess patient/caregiver ability to perform ulcer/skin care regimen upon admission and as needed Provide education on ulcer and skin care Treatment Activities: Skin care regimen initiated : 08/12/2019 Topical wound management initiated : 08/12/2019 Notes: Electronic Signature(s) Signed: 08/12/2019 6:20:01 PM By: Deon Pilling Entered By: Deon Pilling on 08/12/2019 09:29:30 -------------------------------------------------------------------------------- Patient/Caregiver Education Details Patient Name: Date of Service: [name], Mary P. 1/21/2021andnbsp9:00 AM Medical Record [id number] Patient Account Number: [id number] Date of Birth/Gender: Jul 25, 1925 (86 y.o. F) Treating RN: Deon Pilling Primary Care Physician: Simona Huh Other Clinician: Referring Physician: Treating Physician/Extender:Robson, [name], [name] in Treatment: 0 Education Assessment Education Provided To: Patient and Caregiver Education Topics Provided Nutrition: Handouts: Nutrition Methods: Explain/Verbal, Printed Responses: Reinforcements needed Welcome To [facility]: Handouts: Welcome To [facility] Methods: Explain/Verbal, Printed Responses: Reinforcements needed Wound/Skin Impairment: Handouts: Caring for Your Ulcer, Skin Care Do's and Dont's Methods: Explain/Verbal, Printed Responses: Reinforcements needed Electronic Signature(s) Signed: 08/12/2019 6:20:01 PM By: Deon Pilling Entered By: Deon Pilling on 08/12/2019 09:29:49 -------------------------------------------------------------------------------- Wound Assessment Details Patient Name: Date of  Service: [name], [name] 08/12/2019 9:00 AM Medical Record [id number] Patient Account Number: [id number] Date of Birth/Sex: Treating RN: 09-10-1925 (86 y.o. F) Primary Care Ariyan Sinnett: Simona Huh Other Clinician: Referring Emery Binz: Treating Aldin Drees/Extender:Robson, [name], [name] in Treatment: 0 Wound Status Wound Number: 6 Primary Venous Leg Ulcer Etiology: Wound Location: Left Lower Leg - Lateral Wound Open Wounding Event: Gradually Appeared Status: Date Acquired: 06/22/2019 Comorbid Cataracts, Middle ear problems, Anemia, Weeks Of Treatment: 0 History: Arrhythmia, Deep Vein Thrombosis, Clustered Wound: No  Hypertension, Colitis, End Stage Renal Disease, Rheumatoid Arthritis, Osteoarthritis, Received Chemotherapy Photos Wound Measurements Length: (cm) 2.3 % Reducti Width: (cm) 2.2 % Reduct Depth: (cm) 0.1 Epitheli Area: (cm) 3.974 Tunneli Volume: (cm) 0.397 Undermi Wound Description Classification: Full Thickness Without Exposed Support Foul Odo Structures Slough/F Wound Distinct, outline attached Margin: Exudate Small Amount: Exudate Sanguinous Type: Exudate red Color: Wound Bed Granulation Amount: Medium (34-66%) Granulation Quality: Red, Pink Fascia E Necrotic Amount: Medium (34-66%) Fat Laye Necrotic Quality: Adherent Slough Tendon E Muscle E Joint Ex Bone Exp r After Cleansing: No ibrino Yes Exposed Structure xposed: No r (Subcutaneous Tissue) Exposed: Yes xposed: No xposed: No posed: No osed: No on in Area: 0% ion in Volume: 0% alization: None ng: No ning: No Treatment Notes Wound #6 (Left, Lateral Lower Leg) 2. Periwound Care Moisturizing lotion 3. Primary Dressing Applied Calcium Alginate Ag 4. Secondary Dressing ABD Pad Dry Gauze 6. Support Layer Applied 3 layer compression wrap Electronic Signature(s) Signed: 08/17/2019 5:09:30 PM By: Mikeal Hawthorne EMT/HBOT Previous Signature: 08/12/2019 6:15:07  PM Version By: Kela Millin Entered By: Mikeal Hawthorne on 08/17/2019 11:21:51 -------------------------------------------------------------------------------- Vitals Details Patient Name: Date of Service: [name]. 08/12/2019 9:00 AM Medical Record [id number] Patient Account Number: [id number] Date of Birth/Sex: Treating RN: Apr 26, 1926 (86 y.o. [name] Primary Care Stpehen Petitjean: Simona Huh Other Clinician: Referring Larrisa Cravey: Treating Alizae Bechtel/Extender:Robson, [name], [name] in Treatment: 0 Vital Signs Time Taken: 09:30 Temperature (F): 98.3 Height (in): 63 Pulse (bpm): 49 Source: Stated Respiratory Rate (breaths/min): 19 Weight (lbs): 101 Blood Pressure (mmHg): 161/69 Source: Stated Reference Range: 80 - 120 mg / dl Body Mass Index (BMI): 17.9 Electronic Signature(s) Signed: 08/12/2019 6:15:07 PM By: Kela Millin Entered By: Kela Millin on 08/12/2019 09:33:08

## 2019-08-12 NOTE — Progress Notes (Signed)
[name], [name] ([id number]) Visit Report for 08/12/2019 Chief Complaint Document Details Patient Name: Date of Service: [name], [name] 08/12/2019 9:00 AM Medical Record [id number] Patient Account Number: [id number] Date of Birth/Sex: Treating RN: Jun 25, 1926 (86 y.o. F) Primary Care Provider: Simona Huh Other Clinician: Referring Provider: Treating Provider/Extender:[name], [name], [name] in Treatment: 0 Information Obtained from: Patient Chief Complaint 12/04/2018; patient is here for review of wounds on her bilateral lower legs 08/12/2019; patient is here for review of wound on her left lateral lower leg Electronic Signature(s) Signed: 08/12/2019 5:54:58 PM By: Linton Ham MD Entered By: Linton Ham on 08/12/2019 10:38:25 -------------------------------------------------------------------------------- Debridement Details Patient Name: Date of Service: [name]. 08/12/2019 9:00 AM Medical Record [id number] Patient Account Number: [id number] Date of Birth/Sex: Treating RN: 1926/03/24 (86 y.o. F) Primary Care Provider: Simona Huh Other Clinician: Referring Provider: Treating Provider/Extender:[name], [name], [name] in Treatment: 0 Debridement Performed for Wound #6 Left,Lateral Lower Leg Assessment: Performed By: Physician [name]., MD Debridement Type: Debridement Severity of Tissue Pre Fat layer exposed Debridement: Level of Consciousness (Pre- Awake and Alert procedure): Pre-procedure Verification/Time Out Taken: Yes - 09:59 Start Time: 10:00 Pain Control: Lidocaine 4% Topical Solution Total Area Debrided (L x W): 2.3 (cm) x 2.2 (cm) = 5.06 (cm) Tissue and other material Viable, Non-Viable, Slough, Subcutaneous, Skin: Dermis , Fibrin/Exudate, Slough Viable, Non-Viable, Slough, Subcutaneous, Skin: Dermis , Fibrin/Exudate, Slough debrided: Level: Skin/Subcutaneous  Tissue Debridement Description: Excisional Instrument: Curette Bleeding: Minimum Hemostasis Achieved: Pressure End Time: 10:03 Procedural Pain: 0 Post Procedural Pain: 3 Response to Treatment: Procedure was tolerated well Level of Consciousness Awake and Alert (Post-procedure): Post Debridement Measurements of Total Wound Length: (cm) 2.3 Width: (cm) 2.2 Depth: (cm) 0.1 Volume: (cm) 0.397 Character of Wound/Ulcer Post Improved Debridement: Severity of Tissue Post Debridement: Fat layer exposed Post Procedure Diagnosis Same as Pre-procedure Electronic Signature(s) Signed: 08/12/2019 5:54:58 PM By: Linton Ham MD Entered By: Linton Ham on 08/12/2019 10:37:49 -------------------------------------------------------------------------------- HPI Details Patient Name: Date of Service: [name]. 08/12/2019 9:00 AM Medical Record [id number] Patient Account Number: [id number] Date of Birth/Sex: Treating RN: [birth year] (86 y.o. F) Primary Care Provider: Simona Huh Other Clinician: Referring Provider: Treating Provider/Extender:[name], [name], [name] in Treatment: 0 History of Present Illness HPI Description: 12/04/2018 ADMISSION This is a 86 year old woman who lives independently. She was recently hospitalized from 09/04/2018 through 09/08/2018 and then had an SNF stay at [facility] for a predominant fracture of her distal right humerus. She lives at home within 57 year old husband. Since her arrival home she is apparently had a series of trauma on various wheelchairs resulting in 3 wounds on the front of her right lower leg one on the posterior part of the right leg and one small area on the left anterior leg. They have been dressing this with a foam-based dressing. She apparently has a longstanding history of edema in her lower legs. I note from reviewing her records she has chronic kidney disease COPD A. fib on Eliquis but no  mention of a cause of lower extremity edema Past medical history includes Alzheimer's disease, right humerus fracture as discussed, atrial fib, hypertension, history of B-cell lymphoma and MGUS, , COPD and chronic edema ABIs were noncompressible on both sides in our clinic 5/22-86 year old female was started in the wound care clinic last week for wounds on both lower legs. These were described as traumatic right anterior leg, right lateral leg, right distal lateral, right posterior and  left leg wounds. With history of venous hypertension and edema in both legs. Arterial ABIs were noncompressible but pulses were palpable silver alginate was used with 3 layer compression 5/29; this patient has a cluster of wounds on the right mid tibia area related to chronic venous insufficiency and lymphedema also a small area on the left anterior tibia area. We have her with silver alginate and 3 layer compression. The wounds generally look better 6/5; continued improvement on the left there is only a small area medially that is not completely epithelialized. The area on the right mid tibial area has 4 wounds including 3 anteriorly and one posteriorly. Most of these are smaller. We have been using silver alginate 6/12; all the wounds on the left leg are healed. The wounds on the right leg all look smaller and have healthy surfaces. We have been using HFB. She does not yet have a stocking for the left leg 6/19; the patient remains healed on the left leg. The wounds on the right leg are 4. The larger one medially to the tibia 1 over the tibia a small one just lateral to the tibia and a small one on the posterior right calf. Silver alginate. I will change her to [facility] 6/25; the patient remains healed on her left leg and she has a juxta lite stocking today. The wounds on the right leg are smaller and look healthy. We have been using Hydrofera Blue under compression 7/10; the patient has a juxta lite on her  left leg. There is only one wound on the right anterior leg left and it looks healthy. Unfortunately home health did not wrap her leg high enough she has some significant edema above the wrap below the knee and some bruising on the posterior part of this but no additional open wound. 3 of the wounds on the right leg have healed 7/24; the patient has a juxta lite on her left leg. She had a dry scaled area left of the area on the right anterior left lower leg. We have been using Hydrofera Blue under 3 layer compression 7/30; the area on the right medial lower leg is closed. We have been using Hydrofera Blue under 3 layer compression. She can graduate to a juxta lite to her right leg. There lubricating skin READMISSION 08/12/2019 This is a 86 year old woman that we had in this clinic from May to July 2020. At that point she had wounds on her bilateral lower legs which were traumatic in the setting of edema and skin damage from chronic venous insufficiency. The more difficult wound was an area on the left medial calf but we eventually got this to close over and put her in bilateral juxta lite stockings. They have been compliant with the stockings and everything was going well. Unfortunately the patient was admitted to hospital from 07/19/2019 through 07/24/2019 with Covid and UTI with sepsis physiology. During this time she did not have compression on her legs and edema was fairly bad when she finally returned home. About a week later they noted an open area on the left lateral calf. They have been using Neosporin and her juxta lite stocking but is not closing. This is been open for about 1-1/2 months. The patient's past medical history is reviewed its essentially unchanged. She has atrial fibrillation on Eliquis she is not a diabetic. Although her ABIs are noncompressible bilaterally we have not felt that she had a significant arterial issue sufficient to inhibit healing of these largely venous  wounds Electronic Signature(s) Signed: 08/12/2019 5:54:58 PM By: Linton Ham MD Entered By: Linton Ham on 08/12/2019 10:43:49 -------------------------------------------------------------------------------- Physical Exam Details Patient Name: Date of Service: [name] 08/12/2019 9:00 AM Medical Record [id number] Patient Account Number: [id number] Date of Birth/Sex: Treating RN: Oct 18, 1925 (86 y.o. F) Primary Care Provider: Other Clinician: Simona Huh Referring Provider: Treating Provider/Extender:Tayten Bergdoll, [name], [name] in Treatment: 0 Constitutional Patient is hypertensive.. Pulse regular and within target range for patient.Marland Kitchen Respirations regular, non-labored and within target range.. Temperature is normal and within the target range for the patient.Marland Kitchen Appears in no distress. Cardiovascular Pedal pulses palpable on the left. Integumentary (Hair, Skin) There is no evidence of infection around the wound. She has severe bilateral chronic venous changes with hemosiderin deposition and very fragile skin but nothing looks particularly acute. Notes Wound exam; the area in question is on the left lateral mid calf. Necrotic debris over the surface removed with a #5 curette rolled edges of nonviable skin also removed from the circumference. Hemostasis with direct pressure. Electronic Signature(s) Signed: 08/12/2019 5:54:58 PM By: Linton Ham MD Entered By: Linton Ham on 08/12/2019 10:45:02 -------------------------------------------------------------------------------- Physician Orders Details Patient Name: Date of Service: [name], [name] 08/12/2019 9:00 AM Medical Record [id number] Patient Account Number: [id number] Date of Birth/Sex: Treating RN: [birth year] (86 y.o. [name] Primary Care Provider: Simona Huh Other Clinician: Referring Provider: Treating Provider/Extender:Sharlynn Seckinger, [name], [name] in Treatment: 0 Verbal / Phone Orders: No Diagnosis Coding Follow-up Appointments Return Appointment in 1 week. - Thursday Dressing Change Frequency Do not change entire dressing for one week. Skin Barriers/Peri-Wound Care Moisturizing lotion Wound Cleansing May shower with protection. - use cast protector Primary Wound Dressing Wound #6 Left,Lateral Lower Leg Calcium Alginate with Silver Secondary Dressing Wound #6 Left,Lateral Lower Leg Dry Gauze ABD pad Edema Control 3 Layer Compression System - Left Lower Extremity Patient to wear own compression stockings - patient to wear juxtalite to right leg only for now. Apply in the morning and remove at night. Avoid standing for long periods of time Elevate legs to the level of the heart or above for 30 minutes daily and/or when sitting, a frequency of: - throughout the day. Exercise regularly Patient Medications Allergies: penicillin, Sulfa antibiotics, aspirin, Benadryl, amoxicillin, ibuprofen, latex, peanut, ascorbic acid, Benadryl Notifications Medication Indication Start End lidocaine DOSE topical 4 % gel - gel topical applied prior to debridement by MD. Electronic Signature(s) Signed: 08/12/2019 5:54:58 PM By: Linton Ham MD Signed: 08/12/2019 6:20:01 PM By: Deon Pilling Entered By: Deon Pilling on 08/12/2019 10:06:55 -------------------------------------------------------------------------------- Prescription 08/12/2019 Patient Name: [name], Mary P. Provider: Linton Ham MD Date of Birth: 01-08-1926 NPI#: [id number] Sex: F DEA#: [id number] Phone #: [phone] License #: [id number] Patient Address: [address] [place], [place] [address] [place],  [address] [phone] Allergies penicillin Sulfa antibiotics aspirin Benadryl amoxicillin ibuprofen latex Reaction: hives peanut Reaction: itching,  swelling ascorbic acid Benadryl Reaction: N/V Medication Medication: Route: Strength: Form: lidocaine topical 4% gel Class: TOPICAL LOCAL ANESTHETICS Dose: Frequency / Time: Indication: gel topical applied prior to debridement by MD. Number of Refills: Number of Units: 0 Generic Substitution: Start Date: End Date: Administered at [place]: Yes Time Administered: Time Discontinued: Note to Pharmacy: Signature(s): Date(s): Electronic Signature(s) Signed: 08/12/2019 5:54:58 PM By: Linton Ham MD Signed: 08/12/2019 6:20:01 PM By: Deon Pilling Entered By: Deon Pilling on 08/12/2019 10:06:55 --------------------------------------------------------------------------------  Problem List  Details Patient Name: Date of Service: [name], [name] 08/12/2019 9:00 AM Medical Record [id number] Patient Account Number: [id number] Date of Birth/Sex: Treating RN: Dec 02, 1925 (86 y.o. F) Primary Care Provider: Simona Huh Other Clinician: Referring Provider: Treating Provider/Extender:Presley Summerlin, [name], [name] in Treatment: 0 Active Problems ICD-10 Evaluated Encounter Code Description Active Date Today Diagnosis I87.332 Chronic venous hypertension (idiopathic) with ulcer 08/12/2019 No Yes and inflammation of left lower extremity L97.222 Non-pressure chronic ulcer of left calf with fat layer 08/12/2019 No Yes exposed Inactive Problems Resolved Problems Electronic Signature(s) Signed: 08/12/2019 5:54:58 PM By: Linton Ham MD Entered By: Linton Ham on 08/12/2019 10:37:27 -------------------------------------------------------------------------------- Progress Note Details Patient Name: Date of Service: [name]. 08/12/2019 9:00 AM Medical Record [id number] Patient Account Number: [id number] Date of Birth/Sex: Treating RN: 06-04-1926 (86 y.o. F) Primary Care Provider: Simona Huh Other  Clinician: Referring Provider: Treating Provider/Extender:Josie Burleigh, [name], [name] in Treatment: 0 Subjective Chief Complaint Information obtained from Patient 12/04/2018; patient is here for review of wounds on her bilateral lower legs 08/12/2019; patient is here for review of wound on her left lateral lower leg History of Present Illness (HPI) 12/04/2018 ADMISSION This is a 86 year old woman who lives independently. She was recently hospitalized from 09/04/2018 through 09/08/2018 and then had an SNF stay at [facility] for a predominant fracture of her distal right humerus. She lives at home within 76 year old husband. Since her arrival home she is apparently had a series of trauma on various wheelchairs resulting in 3 wounds on the front of her right lower leg one on the posterior part of the right leg and one small area on the left anterior leg. They have been dressing this with a foam-based dressing. She apparently has a longstanding history of edema in her lower legs. I note from reviewing her records she has chronic kidney disease COPD A. fib on Eliquis but no mention of a cause of lower extremity edema Past medical history includes Alzheimer's disease, right humerus fracture as discussed, atrial fib, hypertension, history of B-cell lymphoma and MGUS, , COPD and chronic edema ABIs were noncompressible on both sides in our clinic 5/22-86 year old female was started in the wound care clinic last week for wounds on both lower legs. These were described as traumatic right anterior leg, right lateral leg, right distal lateral, right posterior and left leg wounds. With history of venous hypertension and edema in both legs. Arterial ABIs were noncompressible but pulses were palpable silver alginate was used with 3 layer compression 5/29; this patient has a cluster of wounds on the right mid tibia area related to chronic venous insufficiency and lymphedema also a small area on  the left anterior tibia area. We have her with silver alginate and 3 layer compression. The wounds generally look better 6/5; continued improvement on the left there is only a small area medially that is not completely epithelialized. ooThe area on the right mid tibial area has 4 wounds including 3 anteriorly and one posteriorly. Most of these are smaller. We have been using silver alginate 6/12; all the wounds on the left leg are healed. The wounds on the right leg all look smaller and have healthy surfaces. We have been using HFB. She does not yet have a stocking for the left leg 6/19; the patient remains healed on the left leg. The wounds on the right leg are 4. The larger one medially to the tibia 1 over the tibia a small one just lateral to the  tibia and a small one on the posterior right calf. Silver alginate. I will change her to [facility] 6/25; the patient remains healed on her left leg and she has a juxta lite stocking today. The wounds on the right leg are smaller and look healthy. We have been using Hydrofera Blue under compression 7/10; the patient has a juxta lite on her left leg. There is only one wound on the right anterior leg left and it looks healthy. Unfortunately home health did not wrap her leg high enough she has some significant edema above the wrap below the knee and some bruising on the posterior part of this but no additional open wound. 3 of the wounds on the right leg have healed 7/24; the patient has a juxta lite on her left leg. She had a dry scaled area left of the area on the right anterior left lower leg. We have been using Hydrofera Blue under 3 layer compression 7/30; the area on the right medial lower leg is closed. We have been using Hydrofera Blue under 3 layer compression. She can graduate to a juxta lite to her right leg. There lubricating skin READMISSION 08/12/2019 This is a 86 year old woman that we had in this clinic from May to July 2020. At  that point she had wounds on her bilateral lower legs which were traumatic in the setting of edema and skin damage from chronic venous insufficiency. The more difficult wound was an area on the left medial calf but we eventually got this to close over and put her in bilateral juxta lite stockings. They have been compliant with the stockings and everything was going well. Unfortunately the patient was admitted to hospital from 07/19/2019 through 07/24/2019 with Covid and UTI with sepsis physiology. During this time she did not have compression on her legs and edema was fairly bad when she finally returned home. About a week later they noted an open area on the left lateral calf. They have been using Neosporin and her juxta lite stocking but is not closing. This is been open for about 1-1/2 months. The patient's past medical history is reviewed its essentially unchanged. She has atrial fibrillation on Eliquis she is not a diabetic. Although her ABIs are noncompressible bilaterally we have not felt that she had a significant arterial issue sufficient to inhibit healing of these largely venous wounds Patient History Unable to Obtain Patient History due to Altered Mental Status. Information obtained from Patient. Allergies penicillin, Sulfa antibiotics, aspirin, Benadryl, amoxicillin, ibuprofen, latex (Reaction: hives), peanut (Reaction: itching, swelling), ascorbic acid, Benadryl (Reaction: N/V) Family History Cancer - Siblings, Heart Disease - Siblings, No family history of Diabetes, Hereditary Spherocytosis, Hypertension, Kidney Disease, Lung Disease, Seizures, Stroke, Thyroid Problems, Tuberculosis. Social History Never smoker, Marital Status - Married, Alcohol Use - Never, Drug Use - No History, Caffeine Use - Moderate. Medical History Eyes Patient has history of Cataracts Denies history of Glaucoma, Optic Neuritis Ear/Nose/Mouth/Throat Patient has history of Middle ear problems Denies  history of Chronic sinus problems/congestion Hematologic/Lymphatic Patient has history of Anemia Denies history of Hemophilia, Human Immunodeficiency Virus, Lymphedema, Sickle Cell Disease Respiratory Denies history of Aspiration, Asthma, Chronic Obstructive Pulmonary Disease (COPD), Pneumothorax, Sleep Apnea, Tuberculosis Cardiovascular Patient has history of Arrhythmia, Deep Vein Thrombosis, Hypertension Denies history of Angina, Congestive Heart Failure, Coronary Artery Disease, Hypotension, Myocardial Infarction, Peripheral Arterial Disease, Peripheral Venous Disease, Phlebitis, Vasculitis Gastrointestinal Patient has history of Colitis Denies history of Cirrhosis , Crohnoos, Hepatitis A, Hepatitis B, Hepatitis C Endocrine  Denies history of Type I Diabetes, Type II Diabetes Genitourinary Patient has history of End Stage Renal Disease Immunological Denies history of Lupus Erythematosus, Raynaudoos, Scleroderma Integumentary (Skin) Denies history of History of Burn Musculoskeletal Patient has history of Rheumatoid Arthritis, Osteoarthritis Neurologic Denies history of Dementia, Neuropathy, Quadriplegia, Paraplegia, Seizure Disorder Oncologic Patient has history of Received Chemotherapy Denies history of Received Radiation Psychiatric Denies history of Anorexia/bulimia, Confinement Anxiety Medical And Surgical History Notes Integumentary (Skin) previous wounds to bilateral lower legs Review of Systems (ROS) Constitutional Symptoms (General Health) Denies complaints or symptoms of Fatigue, Fever, Chills, Marked Weight Change. Eyes Denies complaints or symptoms of Dry Eyes, Vision Changes, Glasses / Contacts. Ear/Nose/Mouth/Throat Denies complaints or symptoms of Chronic sinus problems or rhinitis. Respiratory Denies complaints or symptoms of Chronic or frequent coughs, Shortness of Breath. Cardiovascular Denies complaints or symptoms of Chest  pain. Gastrointestinal Denies complaints or symptoms of Frequent diarrhea, Nausea, Vomiting. Endocrine Denies complaints or symptoms of Heat/cold intolerance. Genitourinary Denies complaints or symptoms of Frequent urination. Integumentary (Skin) Complains or has symptoms of Wounds, left leg Musculoskeletal Denies complaints or symptoms of Muscle Pain, Muscle Weakness. Neurologic Denies complaints or symptoms of Numbness/parasthesias. Psychiatric Denies complaints or symptoms of Claustrophobia, Suicidal. Objective Constitutional Patient is hypertensive.. Pulse regular and within target range for patient.Marland Kitchen Respirations regular, non-labored and within target range.. Temperature is normal and within the target range for the patient.Marland Kitchen Appears in no distress. Vitals Time Taken: 9:30 AM, Height: 63 in, Source: Stated, Weight: 101 lbs, Source: Stated, BMI: 17.9, Temperature: 98.3 F, Pulse: 49 bpm, Respiratory Rate: 19 breaths/min, Blood Pressure: 161/69 mmHg. Cardiovascular Pedal pulses palpable on the left. General Notes: Wound exam; the area in question is on the left lateral mid calf. Necrotic debris over the surface removed with a #5 curette rolled edges of nonviable skin also removed from the circumference. Hemostasis with direct pressure. Integumentary (Hair, Skin) There is no evidence of infection around the wound. She has severe bilateral chronic venous changes with hemosiderin deposition and very fragile skin but nothing looks particularly acute. Wound #6 status is Open. Original cause of wound was Gradually Appeared. The wound is located on the Left,Lateral Lower Leg. The wound measures 2.3cm length x 2.2cm width x 0.1cm depth; 3.974cm^2 area and 0.397cm^3 volume. There is Fat Layer (Subcutaneous Tissue) Exposed exposed. There is no tunneling or undermining noted. There is a small amount of sanguinous drainage noted. The wound margin is distinct with the outline attached to the  wound base. There is medium (34-66%) red, pink granulation within the wound bed. There is a medium (34-66%) amount of necrotic tissue within the wound bed including Adherent Slough. Assessment Active Problems ICD-10 Chronic venous hypertension (idiopathic) with ulcer and inflammation of left lower extremity Non-pressure chronic ulcer of left calf with fat layer exposed Procedures Wound #6 Pre-procedure diagnosis of Wound #6 is a Venous Leg Ulcer located on the Left,Lateral Lower Leg .Severity of Tissue Pre Debridement is: Fat layer exposed. There was a Excisional Skin/Subcutaneous Tissue Debridement with a total area of 5.06 sq cm performed by [name]., MD. With the following instrument(s): Curette to remove Viable and Non-Viable tissue/material. Material removed includes Subcutaneous Tissue, Slough, Skin: Dermis, and Fibrin/Exudate after achieving pain control using Lidocaine 4% Topical Solution. A time out was conducted at 09:59, prior to the start of the procedure. A Minimum amount of bleeding was controlled with Pressure. The procedure was tolerated well with a pain level of 0 throughout and a pain level of  3 following the procedure. Post Debridement Measurements: 2.3cm length x 2.2cm width x 0.1cm depth; 0.397cm^3 volume. Character of Wound/Ulcer Post Debridement is improved. Severity of Tissue Post Debridement is: Fat layer exposed. Post procedure Diagnosis Wound #6: Same as Pre-Procedure Plan Follow-up Appointments: Return Appointment in 1 week. - Thursday Dressing Change Frequency: Do not change entire dressing for one week. Skin Barriers/Peri-Wound Care: Moisturizing lotion Wound Cleansing: May shower with protection. - use cast protector Primary Wound Dressing: Wound #6 Left,Lateral Lower Leg: Calcium Alginate with Silver Secondary Dressing: Wound #6 Left,Lateral Lower Leg: Dry Gauze ABD pad Edema Control: 3 Layer Compression System - Left Lower  Extremity Patient to wear own compression stockings - patient to wear juxtalite to right leg only for now. Apply in the morning and remove at night. Avoid standing for long periods of time Elevate legs to the level of the heart or above for 30 minutes daily and/or when sitting, a frequency of: - throughout the day. Exercise regularly The following medication(s) was prescribed: lidocaine topical 4 % gel gel topical applied prior to debridement by MD. was prescribed at facility 1. Post debridement the wound looked healthy we are going to use silver collagen under 3 layer compression 2. At this point I do not see any reason to involve health and health, we are going to leave the dressing in place all week. We spoke to the patient's caregiver who is familiar with this protocol, keeping the dressing dry etc. 3. We will see her back next week. Electronic Signature(s) Signed: 08/12/2019 5:54:58 PM By: Linton Ham MD Entered By: Linton Ham on 08/12/2019 10:45:57 -------------------------------------------------------------------------------- HxROS Details Patient Name: Date of Service: [name]. 08/12/2019 9:00 AM Medical Record [id number] Patient Account Number: [id number] Date of Birth/Sex: Treating RN: [birth year] (86 y.o. [name] Primary Care Provider: Simona Huh Other Clinician: Referring Provider: Treating Provider/Extender:Khary Schaben, [name], [name] in Treatment: 0 Unable to Obtain Patient History due to Altered Mental Status Information Obtained From Patient Constitutional Symptoms (General Health) Complaints and Symptoms: Negative for: Fatigue; Fever; Chills; Marked Weight Change Eyes Complaints and Symptoms: Negative for: Dry Eyes; Vision Changes; Glasses / Contacts Medical History: Positive for: Cataracts Negative for: Glaucoma; Optic Neuritis Ear/Nose/Mouth/Throat Complaints and Symptoms: Negative for: Chronic sinus  problems or rhinitis Medical History: Positive for: Middle ear problems Negative for: Chronic sinus problems/congestion Respiratory Complaints and Symptoms: Negative for: Chronic or frequent coughs; Shortness of Breath Medical History: Negative for: Aspiration; Asthma; Chronic Obstructive Pulmonary Disease (COPD); Pneumothorax; Sleep Apnea; Tuberculosis Cardiovascular Complaints and Symptoms: Negative for: Chest pain Medical History: Positive for: Arrhythmia; Deep Vein Thrombosis; Hypertension Negative for: Angina; Congestive Heart Failure; Coronary Artery Disease; Hypotension; Myocardial Infarction; Peripheral Arterial Disease; Peripheral Venous Disease; Phlebitis; Vasculitis Gastrointestinal Complaints and Symptoms: Negative for: Frequent diarrhea; Nausea; Vomiting Medical History: Positive for: Colitis Negative for: Cirrhosis ; Crohns; Hepatitis A; Hepatitis B; Hepatitis C Endocrine Complaints and Symptoms: Negative for: Heat/cold intolerance Medical History: Negative for: Type I Diabetes; Type II Diabetes Genitourinary Complaints and Symptoms: Negative for: Frequent urination Medical History: Positive for: End Stage Renal Disease Integumentary (Skin) Complaints and Symptoms: Positive for: Wounds Review of System Notes: left leg Medical History: Negative for: History of Burn Past Medical History Notes: previous wounds to bilateral lower legs Musculoskeletal Complaints and Symptoms: Negative for: Muscle Pain; Muscle Weakness Medical History: Positive for: Rheumatoid Arthritis; Osteoarthritis Neurologic Complaints and Symptoms: Negative for: Numbness/parasthesias Medical History: Negative for: Dementia; Neuropathy; Quadriplegia; Paraplegia; Seizure Disorder Psychiatric Complaints and Symptoms: Negative  for: Claustrophobia; Suicidal Medical History: Negative for: Anorexia/bulimia; Confinement Anxiety Hematologic/Lymphatic Medical History: Positive for:  Anemia Negative for: Hemophilia; Human Immunodeficiency Virus; Lymphedema; Sickle Cell Disease Immunological Medical History: Negative for: Lupus Erythematosus; Raynauds; Scleroderma Oncologic Medical History: Positive for: Received Chemotherapy Negative for: Received Radiation HBO Extended History Items Eyes: Ear/Nose/Mouth/Throat: Cataracts Middle ear problems Immunizations Pneumococcal Vaccine: Received Pneumococcal Vaccination: No Implantable Devices None Family and Social History Cancer: Yes - Siblings; Diabetes: No; Heart Disease: Yes - Siblings; Hereditary Spherocytosis: No; Hypertension: No; Kidney Disease: No; Lung Disease: No; Seizures: No; Stroke: No; Thyroid Problems: No; Tuberculosis: No; Never smoker; Marital Status - Married; Alcohol Use: Never; Drug Use: No History; Caffeine Use: Moderate; Financial Concerns: No; Food, Clothing or Shelter Needs: No; Support System Lacking: No; Transportation Concerns: No Electronic Signature(s) Signed: 08/12/2019 5:54:58 PM By: Linton Ham MD Signed: 08/12/2019 6:15:07 PM By: Kela Millin Entered By: Kela Millin on 08/12/2019 09:39:23 -------------------------------------------------------------------------------- SuperBill Details Patient Name: Date of Service: ERNESHA, RAMONE 08/12/2019 Medical Record [id number] Patient Account Number: [id number] Date of Birth/Sex: Treating RN: [birth year] (86 y.o. Helene Shoe, Meta.Reding Primary Care Provider: Simona Huh Other Clinician: Referring Provider: Treating Provider/Extender:Jarmon Javid, [name], [name] in Treatment: 0 Diagnosis Coding ICD-10 Codes Code Description [phone] Chronic venous hypertension (idiopathic) with ulcer and inflammation of left lower extremity L97.222 Non-pressure chronic ulcer of left calf with fat layer exposed Facility Procedures The patient participates with Medicare or their insurance follows the Medicare Facility  Guidelines: CPT4 Code Description Modifier Quantity [id number] - WOUND CARE VISIT-LEV 3 EST PT 1 The patient participates with Medicare or their insurance follows the Medicare Facility Guidelines: [id number] [place] - DEB SUBQ TISSUE 20 SQ CM/< 1 ICD-10 Diagnosis Description L97.222 Non-pressure chronic ulcer of left calf with fat layer exposed I87.332  Chronic venous hypertension (idiopathic) with ulcer and inflammation of left lower extremity Physician Procedures CPT4 Code Description: [id number] 99213 - WC PHYS LEVEL 3 - EST PT ICD-10 Diagnosis Description I87.332 Chronic venous hypertension (idiopathic) with ulcer and i extremity L97.222 Non-pressure chronic ulcer of left calf with fat layer ex Modifier: 25 nflammation of posed Quantity: 1 left lower CPT4 Code Description: [id number] 11042 - WC PHYS SUBQ TISS 20 SQ CM ICD-10 Diagnosis Description L97.222 Non-pressure chronic ulcer of left calf with fat layer ex I87.332 Chronic venous hypertension (idiopathic) with ulcer and i extremity Modifier: posed nflammation of Quantity: 1 left lower Electronic Signature(s) Signed: 08/12/2019 5:54:58 PM By: Linton Ham MD Entered By: Linton Ham on 08/12/2019 10:46:23

## 2019-08-12 NOTE — Progress Notes (Signed)
[name], [name] ([id number]) Visit Report for 08/12/2019 Abuse/Suicide Risk Screen Details Patient Name: Date of Service: [name], [name] 08/12/2019 9:00 AM Medical Record [id number] Patient Account Number: [id number] Date of Birth/Sex: Treating RN: 11-11-1925 (86 y.o. [name] Primary Care Migdalia Olejniczak: Simona Huh Other Clinician: Referring Zacchary Pompei: Treating Fardeen Steinberger/Extender:Robson, [name], [name] in Treatment: 0 Abuse/Suicide Risk Screen Items Answer ABUSE RISK SCREEN: Has anyone close to you tried to hurt or harm you recentlyo No Do you feel uncomfortable with anyone in your familyo No Has anyone forced you do things that you didnt want to doo No Electronic Signature(s) Signed: 08/12/2019 6:15:07 PM By: Kela Millin Entered By: Kela Millin on 08/12/2019 09:39:31 -------------------------------------------------------------------------------- Activities of Daily Living Details Patient Name: Date of Service: [name], [name] 08/12/2019 9:00 AM Medical Record [id number] Patient Account Number: [id number] Date of Birth/Sex: Treating RN: Sep 14, 1925 (86 y.o. [name] Primary Care Reem Fleury: Simona Huh Other Clinician: Referring Sumiye Hirth: Treating Lasalle Abee/Extender:Robson, [name], [name] in Treatment: 0 Activities of Daily Living Items Answer Activities of Daily Living (Please select one for each item) Drive Automobile Not Able Take Medications Need Assistance Use Telephone Need Assistance Care for Appearance Need Assistance Use Toilet Need Assistance Bath / Shower Need Assistance Dress Self Need Assistance Feed Self Completely Able Walk Not Able Get In / Out Bed Need Assistance Housework Not Able Prepare Meals Not Able Handle Money Need Assistance Shop for Self Not Able Electronic Signature(s) Signed: 08/12/2019 6:15:07 PM By: Kela Millin Entered By: Kela Millin  on 08/12/2019 09:40:09 -------------------------------------------------------------------------------- Education Screening Details Patient Name: Date of Service: [name], Mary P. 08/12/2019 9:00 AM Medical Record [id number] Patient Account Number: [id number] Date of Birth/Sex: Treating RN: [birth year] (86 y.o. [name] Primary Care Raydel Hosick: Simona Huh Other Clinician: Referring Koua Deeg: Treating Carlisle Enke/Extender:Robson, [name], [name] in Treatment: 0 Primary Learner Assessed: Patient Learning Preferences/Education Level/Primary Language Learning Preference: Explanation Highest Education Level: College or Above Preferred Language: English Cognitive Barrier Language Barrier: No Translator Needed: No Memory Deficit: No Emotional Barrier: No Cultural/Religious Beliefs Affecting Medical Care: No Physical Barrier Impaired Vision: Yes Glasses Impaired Hearing: No Decreased Hand dexterity: No Knowledge/Comprehension Knowledge Level: High Comprehension Level: High Ability to understand written High instructions: Ability to understand verbal High instructions: Motivation Anxiety Level: Calm Cooperation: Cooperative Education Importance: Acknowledges Need Interest in Health Problems: Asks Questions Perception: Coherent Willingness to Engage in Self- High Management Activities: Readiness to Engage in Self- High Management Activities: Electronic Signature(s) Signed: 08/12/2019 6:15:07 PM By: Kela Millin Entered By: Kela Millin on 08/12/2019 09:40:47 -------------------------------------------------------------------------------- Fall Risk Assessment Details Patient Name: Date of Service: Mary Favre. 08/12/2019 9:00 AM Medical Record [id number] Patient Account Number: [id number] Date of Birth/Sex: Treating RN: 1926-01-19 (86 y.o. [name] Primary Care Ethell Blatchford: Simona Huh Other  Clinician: Referring Maecy Podgurski: Treating Laniece Hornbaker/Extender:Robson, [name], [name] in Treatment: 0 Fall Risk Assessment Items Have you had 2 or more falls in the last 12 monthso 0 No Have you had any fall that resulted in injury in the last 12 monthso 0 No FALLS RISK SCREEN History of falling - immediate or within 3 months 0 No Secondary diagnosis (Do you have 2 or more medical diagnoseso) 15 Yes Ambulatory aid None/bed rest/wheelchair/nurse 0 Yes Crutches/cane/walker 0 No Furniture 0 No Intravenous therapy Access/Saline/Heparin Lock 0 No Weak (short steps with or without shuffle, stooped but able to lift head 0 No while walking, may seek support from furniture) Impaired (  short steps with shuffle, may have difficulty arising from chair, 0 No head down, impaired balance) Mental Status Oriented to own ability 0 Yes Overestimates or forgets limitations 0 No Risk Level: Low Risk Score: 15 Electronic Signature(s) Signed: 08/12/2019 6:15:07 PM By: Kela Millin Entered By: Kela Millin on 08/12/2019 09:41:20 -------------------------------------------------------------------------------- Foot Assessment Details Patient Name: Date of Service: Mary Favre. 08/12/2019 9:00 AM Medical Record [id number] Patient Account Number: [id number] Date of Birth/Sex: Treating RN: 01-08-1926 (86 y.o. [name] Primary Care Aries Townley: Simona Huh Other Clinician: Referring Shevaun Lovan: Treating Nohealani Medinger/Extender:Robson, [name], [name] in Treatment: 0 Foot Assessment Items Site Locations + = Sensation present, - = Sensation absent, C = Callus, U = Ulcer R = Redness, W = Warmth, M = Maceration, PU = Pre-ulcerative lesion F = Fissure, S = Swelling, D = Dryness Assessment Right: Left: Other Deformity: No No Prior Foot Ulcer: No No Prior Amputation: No No Charcot Joint: No No Ambulatory Status: Non-ambulatory Assistance Device:  Wheelchair Gait: Administrator, arts) Signed: 08/12/2019 6:15:07 PM By: Kela Millin Entered By: Kela Millin on 08/12/2019 09:42:06 -------------------------------------------------------------------------------- Nutrition Risk Screening Details Patient Name: Date of Service: [name], [name] 08/12/2019 9:00 AM Medical Record [id number] Patient Account Number: [id number] Date of Birth/Sex: Treating RN: 1926/07/08 (86 y.o. [name] Primary Care Zooey Schreurs: Simona Huh Other Clinician: Referring Tiann Saha: Treating Alvan Culpepper/Extender:Robson, [name], [name] in Treatment: 0 Height (in): 63 Weight (lbs): 101 Body Mass Index (BMI): 17.9 Nutrition Risk Screening Items Score Screening NUTRITION RISK SCREEN: I have an illness or condition that made me change the kind and/or 0 No amount of food I eat I eat fewer than two meals per day 0 No I eat few fruits and vegetables, or milk products 0 No I have three or more drinks of beer, liquor or wine almost every day 0 No I have tooth or mouth problems that make it hard for me to eat 0 No I don't always have enough money to buy the food I need 0 No I eat alone most of the time 0 No I take three or more different prescribed or over-the-counter drugs a day 1 Yes 0 No Without wanting to, I have lost or gained 10 pounds in the last six months I am not always physically able to shop, cook and/or feed myself 2 Yes Nutrition Protocols Good Risk Protocol Provide education on Moderate Risk Protocol 0 nutrition High Risk Proctocol Risk Level: Moderate Risk Score: 3 Electronic Signature(s) Signed: 08/12/2019 6:15:07 PM By: Kela Millin Entered By: Kela Millin on 08/12/2019 09:41:49

## 2019-08-13 ENCOUNTER — Encounter: Payer: Self-pay | Admitting: Podiatry

## 2019-08-13 ENCOUNTER — Ambulatory Visit: Payer: Medicare Other | Admitting: Podiatry

## 2019-08-13 ENCOUNTER — Other Ambulatory Visit: Payer: Self-pay

## 2019-08-13 DIAGNOSIS — M79674 Pain in right toe(s): Secondary | ICD-10-CM | POA: Diagnosis not present

## 2019-08-13 DIAGNOSIS — M79675 Pain in left toe(s): Secondary | ICD-10-CM

## 2019-08-13 DIAGNOSIS — B351 Tinea unguium: Secondary | ICD-10-CM | POA: Diagnosis not present

## 2019-08-13 DIAGNOSIS — D689 Coagulation defect, unspecified: Secondary | ICD-10-CM

## 2019-08-13 NOTE — Progress Notes (Signed)
  Subjective:  Patient ID: [name], female    DOB: 07-14-1926,  MRN: [id number]  Chief Complaint  Patient presents with  . Nail Problem    pt is here for a nail trim, pt is also on eliquis as well.   86 y.o. female returns for the above complaint.  Patient presents with thickened elongated mycotic toenails x10.  Patient says it is painful to walk on.  She has not been able to treat them as they are getting very thick and unable to be debrided.  Patient would like to know if she can get them debrided here today.  Patient is currently on Eliquis.  She denies any other acute complaints.  Objective:  There were no vitals filed for this visit. Podiatric Exam: Vascular: dorsalis pedis and posterior tibial pulses are palpable bilateral. Capillary return is immediate. Temperature gradient is WNL. Skin turgor WNL  Sensorium: Normal Semmes Weinstein monofilament test. Normal tactile sensation bilaterally. Nail Exam: Pt has thick disfigured discolored nails with subungual debris noted bilateral entire nail hallux through fifth toenails Ulcer Exam: There is no evidence of ulcer or pre-ulcerative changes or infection. Orthopedic Exam: Muscle tone and strength are WNL. No limitations in general ROM. No crepitus or effusions noted. HAV  B/L.  Hammer toes 2-5  B/L. Skin: No Porokeratosis. No infection or ulcers  Assessment & Plan:  Patient was evaluated and treated and all questions answered.  Onychomycosis with pain  -Nails palliatively debrided as below. -Educated on self-care  Procedure: Nail Debridement Rationale: pain  Type of Debridement: manual, sharp debridement. Instrumentation: Nail nipper, rotary burr. Number of Nails: 10  Procedures and Treatment: Consent by patient was obtained for treatment procedures. The patient understood the discussion of treatment and procedures well. All questions were answered thoroughly reviewed. Debridement of mycotic and hypertrophic toenails, 1  through 5 bilateral and clearing of subungual debris. No ulceration, no infection noted.  Return Visit-Office Procedure: Patient instructed to return to the office for a follow up visit 3 months for continued evaluation and treatment.  Boneta Lucks, DPM    Return in about 3 months (around 11/11/2019).

## 2019-08-18 ENCOUNTER — Other Ambulatory Visit: Payer: Self-pay

## 2019-08-20 ENCOUNTER — Encounter (HOSPITAL_BASED_OUTPATIENT_CLINIC_OR_DEPARTMENT_OTHER): Payer: Medicare Other | Attending: Internal Medicine | Admitting: Internal Medicine

## 2019-08-20 ENCOUNTER — Other Ambulatory Visit: Payer: Self-pay

## 2019-08-20 DIAGNOSIS — L97222 Non-pressure chronic ulcer of left calf with fat layer exposed: Secondary | ICD-10-CM | POA: Diagnosis not present

## 2019-08-20 NOTE — Progress Notes (Signed)
[name], [name] ([id number]) Visit Report for 08/20/2019 HPI Details Patient Name: Date of Service: [name], [name] 08/20/2019 10:15 AM Medical Record [id number] Patient Account Number: [id number] Date of Birth/Sex: Treating RN: 1926-01-12 (86 y.o. F) Primary Care Provider: Simona Huh Other Clinician: Referring Provider: Treating Provider/Extender:Bailey Faiella, [name], [name] in Treatment: 1 History of Present Illness HPI Description: 12/04/2018 ADMISSION This is a 86 year old woman who lives independently. She was recently hospitalized from 09/04/2018 through 09/08/2018 and then had an SNF stay at [facility] for a predominant fracture of her distal right humerus. She lives at home within 71 year old husband. Since her arrival home she is apparently had a series of trauma on various wheelchairs resulting in 3 wounds on the front of her right lower leg one on the posterior part of the right leg and one small area on the left anterior leg. They have been dressing this with a foam-based dressing. She apparently has a longstanding history of edema in her lower legs. I note from reviewing her records she has chronic kidney disease COPD A. fib on Eliquis but no mention of a cause of lower extremity edema Past medical history includes Alzheimer's disease, right humerus fracture as discussed, atrial fib, hypertension, history of B-cell lymphoma and MGUS, , COPD and chronic edema ABIs were noncompressible on both sides in our clinic 5/22-86 year old female was started in the wound care clinic last week for wounds on both lower legs. These were described as traumatic right anterior leg, right lateral leg, right distal lateral, right posterior and left leg wounds. With history of venous hypertension and edema in both legs. Arterial ABIs were noncompressible but pulses were palpable silver alginate was used with 3 layer compression 5/29; this patient has a cluster of wounds  on the right mid tibia area related to chronic venous insufficiency and lymphedema also a small area on the left anterior tibia area. We have her with silver alginate and 3 layer compression. The wounds generally look better 6/5; continued improvement on the left there is only a small area medially that is not completely epithelialized. The area on the right mid tibial area has 4 wounds including 3 anteriorly and one posteriorly. Most of these are smaller. We have been using silver alginate 6/12; all the wounds on the left leg are healed. The wounds on the right leg all look smaller and have healthy surfaces. We have been using HFB. She does not yet have a stocking for the left leg 6/19; the patient remains healed on the left leg. The wounds on the right leg are 4. The larger one medially to the tibia 1 over the tibia a small one just lateral to the tibia and a small one on the posterior right calf. Silver alginate. I will change her to [facility] 6/25; the patient remains healed on her left leg and she has a juxta lite stocking today. The wounds on the right leg are smaller and look healthy. We have been using Hydrofera Blue under compression 7/10; the patient has a juxta lite on her left leg. There is only one wound on the right anterior leg left and it looks healthy. Unfortunately home health did not wrap her leg high enough she has some significant edema above the wrap below the knee and some bruising on the posterior part of this but no additional open wound. 3 of the wounds on the right leg have healed 7/24; the patient has a juxta lite on her left leg. She  had a dry scaled area left of the area on the right anterior left lower leg. We have been using Hydrofera Blue under 3 layer compression 7/30; the area on the right medial lower leg is closed. We have been using Hydrofera Blue under 3 layer compression. She can graduate to a juxta lite to her right leg. There lubricating  skin READMISSION 08/12/2019 This is a 86 year old woman that we had in this clinic from May to July 2020. At that point she had wounds on her bilateral lower legs which were traumatic in the setting of edema and skin damage from chronic venous insufficiency. The more difficult wound was an area on the left medial calf but we eventually got this to close over and put her in bilateral juxta lite stockings. They have been compliant with the stockings and everything was going well. Unfortunately the patient was admitted to hospital from 07/19/2019 through 07/24/2019 with Covid and UTI with sepsis physiology. During this time she did not have compression on her legs and edema was fairly bad when she finally returned home. About a week later they noted an open area on the left lateral calf. They have been using Neosporin and her juxta lite stocking but is not closing. This is been open for about 1-1/2 months. The patient's past medical history is reviewed its essentially unchanged. She has atrial fibrillation on Eliquis she is not a diabetic. Although her ABIs are noncompressible bilaterally we have not felt that she had a significant arterial issue sufficient to inhibit healing of these largely venous wounds 1/29; the patient is actually doing quite well. Wound dimensions are down significantly. Her edema control is good. We have been using silver alginate under 3 layer compression. Electronic Signature(s) Signed: 08/20/2019 5:55:42 PM By: Linton Ham MD Entered By: Linton Ham on 08/20/2019 11:29:45 -------------------------------------------------------------------------------- Physical Exam Details Patient Name: Date of Service: [name], [name] 08/20/2019 10:15 AM Medical Record [id number] Patient Account Number: [id number] Date of Birth/Sex: Treating RN: 12/25/1925 (86 y.o. F) Primary Care Provider: Simona Huh Other Clinician: Referring Provider: Treating  Provider/Extender:Keldrick Pomplun, [name], [name] in Treatment: 1 Constitutional Patient is hypertensive.. Pulse regular and within target range for patient.Marland Kitchen Respirations regular, non-labored and within target range.. Temperature is normal and within the target range for the patient.Marland Kitchen Appears in no distress. Eyes Conjunctivae clear. No discharge.no icterus. Respiratory work of breathing is normal. Cardiovascular Pedal pulses are palpable. We have excellent edema control. Integumentary (Hair, Skin) Changes of chronic venous insufficiency. Notes Wound exam; the area in question is on the left lateral mid calf. Under illumination the wound surface look quite good. Rolled edges of skin on the wound may need debridement however with a nice improvement in wound size I did not do mechanical debridement today. Wound was washed off with Anasept and gauze. We have good edema control. Electronic Signature(s) Signed: 08/20/2019 5:55:42 PM By: Linton Ham MD Entered By: Linton Ham on 08/20/2019 11:31:20 -------------------------------------------------------------------------------- Physician Orders Details Patient Name: Date of Service: [name], [name] 08/20/2019 10:15 AM Medical Record [id number] Patient Account Number: [id number] Date of Birth/Sex: Treating RN: [birth year] (86 y.o. [name] Primary Care Provider: Simona Huh Other Clinician: Referring Provider: Treating Provider/Extender:Giovannina Mun, [name], [name] in Treatment: 1 Verbal / Phone Orders: No Diagnosis Coding ICD-10 Coding Code Description [id number] Chronic venous hypertension (idiopathic) with ulcer and inflammation of left lower extremity L97.222 Non-pressure chronic ulcer of left calf with fat layer exposed Follow-up Appointments Return Appointment in 1  week. Dressing Change Frequency Do not change entire dressing for one week. Skin Barriers/Peri-Wound  Care Moisturizing lotion Wound Cleansing May shower with protection. - use cast protector Primary Wound Dressing Wound #6 Left,Lateral Lower Leg Calcium Alginate with Silver Secondary Dressing Wound #6 Left,Lateral Lower Leg Dry Gauze ABD pad Edema Control 3 Layer Compression System - Left Lower Extremity Patient to wear own compression stockings - patient to wear juxtalite to right leg only for now. Apply in the morning and remove at night. Avoid standing for long periods of time Elevate legs to the level of the heart or above for 30 minutes daily and/or when sitting, a frequency of: - throughout the day. Exercise regularly Electronic Signature(s) Signed: 08/20/2019 5:49:40 PM By: Kela Millin Signed: 08/20/2019 5:55:42 PM By: Linton Ham MD Entered By: Kela Millin on 08/20/2019 10:39:02 -------------------------------------------------------------------------------- Problem List Details Patient Name: Date of Service: [name], [name] 08/20/2019 10:15 AM Medical Record [id number] Patient Account Number: [id number] Date of Birth/Sex: Treating RN: Feb 04, 1926 (86 y.o. [name] Primary Care Provider: Simona Huh Other Clinician: Referring Provider: Treating Provider/Extender:Rasool Rommel, [name], [name] in Treatment: 1 Active Problems ICD-10 Evaluated Encounter Code Description Active Date Today Diagnosis I87.332 Chronic venous hypertension (idiopathic) with ulcer 08/12/2019 No Yes and inflammation of left lower extremity L97.222 Non-pressure chronic ulcer of left calf with fat layer 08/12/2019 No Yes exposed Inactive Problems Resolved Problems Electronic Signature(s) Signed: 08/20/2019 5:55:42 PM By: Linton Ham MD Entered By: Linton Ham on 08/20/2019 11:29:04 -------------------------------------------------------------------------------- Progress Note Details Patient Name: Date of Service: [name], Mary P.  08/20/2019 10:15 AM Medical Record [id number] Patient Account Number: [id number] Date of Birth/Sex: Treating RN: [birth year] (86 y.o. F) Primary Care Provider: Simona Huh Other Clinician: Referring Provider: Treating Provider/Extender:Albertine Lafoy, [name], [name] in Treatment: 1 Subjective History of Present Illness (HPI) 12/04/2018 ADMISSION This is a 86 year old woman who lives independently. She was recently hospitalized from 09/04/2018 through 09/08/2018 and then had an SNF stay at [facility] for a predominant fracture of her distal right humerus. She lives at home within 5 year old husband. Since her arrival home she is apparently had a series of trauma on various wheelchairs resulting in 3 wounds on the front of her right lower leg one on the posterior part of the right leg and one small area on the left anterior leg. They have been dressing this with a foam-based dressing. She apparently has a longstanding history of edema in her lower legs. I note from reviewing her records she has chronic kidney disease COPD A. fib on Eliquis but no mention of a cause of lower extremity edema Past medical history includes Alzheimer's disease, right humerus fracture as discussed, atrial fib, hypertension, history of B-cell lymphoma and MGUS, , COPD and chronic edema ABIs were noncompressible on both sides in our clinic 5/22-86 year old female was started in the wound care clinic last week for wounds on both lower legs. These were described as traumatic right anterior leg, right lateral leg, right distal lateral, right posterior and left leg wounds. With history of venous hypertension and edema in both legs. Arterial ABIs were noncompressible but pulses were palpable silver alginate was used with 3 layer compression 5/29; this patient has a cluster of wounds on the right mid tibia area related to chronic venous insufficiency and lymphedema also a small area on the left  anterior tibia area. We have her with silver alginate and 3 layer compression. The wounds generally look better 6/5; continued improvement on the  left there is only a small area medially that is not completely epithelialized. ooThe area on the right mid tibial area has 4 wounds including 3 anteriorly and one posteriorly. Most of these are smaller. We have been using silver alginate 6/12; all the wounds on the left leg are healed. The wounds on the right leg all look smaller and have healthy surfaces. We have been using HFB. She does not yet have a stocking for the left leg 6/19; the patient remains healed on the left leg. The wounds on the right leg are 4. The larger one medially to the tibia 1 over the tibia a small one just lateral to the tibia and a small one on the posterior right calf. Silver alginate. I will change her to [facility] 6/25; the patient remains healed on her left leg and she has a juxta lite stocking today. The wounds on the right leg are smaller and look healthy. We have been using Hydrofera Blue under compression 7/10; the patient has a juxta lite on her left leg. There is only one wound on the right anterior leg left and it looks healthy. Unfortunately home health did not wrap her leg high enough she has some significant edema above the wrap below the knee and some bruising on the posterior part of this but no additional open wound. 3 of the wounds on the right leg have healed 7/24; the patient has a juxta lite on her left leg. She had a dry scaled area left of the area on the right anterior left lower leg. We have been using Hydrofera Blue under 3 layer compression 7/30; the area on the right medial lower leg is closed. We have been using Hydrofera Blue under 3 layer compression. She can graduate to a juxta lite to her right leg. There lubricating skin READMISSION 08/12/2019 This is a 86 year old woman that we had in this clinic from May to July 2020. At that point  she had wounds on her bilateral lower legs which were traumatic in the setting of edema and skin damage from chronic venous insufficiency. The more difficult wound was an area on the left medial calf but we eventually got this to close over and put her in bilateral juxta lite stockings. They have been compliant with the stockings and everything was going well. Unfortunately the patient was admitted to hospital from 07/19/2019 through 07/24/2019 with Covid and UTI with sepsis physiology. During this time she did not have compression on her legs and edema was fairly bad when she finally returned home. About a week later they noted an open area on the left lateral calf. They have been using Neosporin and her juxta lite stocking but is not closing. This is been open for about 1-1/2 months. The patient's past medical history is reviewed its essentially unchanged. She has atrial fibrillation on Eliquis she is not a diabetic. Although her ABIs are noncompressible bilaterally we have not felt that she had a significant arterial issue sufficient to inhibit healing of these largely venous wounds 1/29; the patient is actually doing quite well. Wound dimensions are down significantly. Her edema control is good. We have been using silver alginate under 3 layer compression. Objective Constitutional Patient is hypertensive.. Pulse regular and within target range for patient.Marland Kitchen Respirations regular, non-labored and within target range.. Temperature is normal and within the target range for the patient.Marland Kitchen Appears in no distress. Vitals Time Taken: 10:35 AM, Height: 63 in, Weight: 101 lbs, BMI: 17.9, Temperature: 98.3 F,  Pulse: 57 bpm, Respiratory Rate: 20 breaths/min, Blood Pressure: 176/88 mmHg. Eyes Conjunctivae clear. No discharge.no icterus. Respiratory work of breathing is normal. Cardiovascular Pedal pulses are palpable. We have excellent edema control. General Notes: Wound exam; the area in question is  on the left lateral mid calf. Under illumination the wound surface look quite good. Rolled edges of skin on the wound may need debridement however with a nice improvement in wound size I did not do mechanical debridement today. Wound was washed off with Anasept and gauze. We have good edema control. Integumentary (Hair, Skin) Changes of chronic venous insufficiency. Wound #6 status is Open. Original cause of wound was Gradually Appeared. The wound is located on the Left,Lateral Lower Leg. The wound measures 1.5cm length x 1.7cm width x 0.1cm depth; 2.003cm^2 area and 0.2cm^3 volume. There is Fat Layer (Subcutaneous Tissue) Exposed exposed. There is no tunneling or undermining noted. There is a medium amount of serosanguineous drainage noted. The wound margin is distinct with the outline attached to the wound base. There is large (67-100%) red, pink granulation within the wound bed. There is no necrotic tissue within the wound bed. Assessment Active Problems ICD-10 Chronic venous hypertension (idiopathic) with ulcer and inflammation of left lower extremity Non-pressure chronic ulcer of left calf with fat layer exposed Procedures Wound #6 Pre-procedure diagnosis of Wound #6 is a Venous Leg Ulcer located on the Left,Lateral Lower Leg . There was a Three Layer Compression Therapy Procedure by Deon Pilling, RN. Post procedure Diagnosis Wound #6: Same as Pre-Procedure Plan Follow-up Appointments: Return Appointment in 1 week. Dressing Change Frequency: Do not change entire dressing for one week. Skin Barriers/Peri-Wound Care: Moisturizing lotion Wound Cleansing: May shower with protection. - use cast protector Primary Wound Dressing: Wound #6 Left,Lateral Lower Leg: Calcium Alginate with Silver Secondary Dressing: Wound #6 Left,Lateral Lower Leg: Dry Gauze ABD pad Edema Control: 3 Layer Compression System - Left Lower Extremity Patient to wear own compression stockings - patient to  wear juxtalite to right leg only for now. Apply in the morning and remove at night. Avoid standing for long periods of time Elevate legs to the level of the heart or above for 30 minutes daily and/or when sitting, a frequency of: - throughout the day. Exercise regularly #1. I am continuing with the silver alginate #2. We have good edema control #3. The patient has her own juxta lite stocking although I am not exactly sure she was wearing this when this wound form #4. She has good arterial supply there are no worries here. Electronic Signature(s) Signed: 08/20/2019 5:55:42 PM By: Linton Ham MD Entered By: Linton Ham on 08/20/2019 11:33:35 -------------------------------------------------------------------------------- SuperBill Details Patient Name: Date of Service: [name], [name] 08/20/2019 Medical Record [id number] Patient Account Number: [id number] Date of Birth/Sex: Treating RN: 1925/07/30 (86 y.o. [name] Primary Care Provider: Simona Huh Other Clinician: Referring Provider: Treating Provider/Extender:Tamilyn Lupien, [name], [name] in Treatment: 1 Diagnosis Coding ICD-10 Codes Code Description [phone] Chronic venous hypertension (idiopathic) with ulcer and inflammation of left lower extremity L97.222 Non-pressure chronic ulcer of left calf with fat layer exposed Facility Procedures The patient participates with Medicare or their insurance follows the Medicare Facility Guidelines: CPT4 Code Description Modifier Quantity [phone] (Facility Use Only) [phone] - [facility] Physician Procedures CPT4 Code Description: [id number] - WC PHYS LEVEL 3 - EST PT ICD-10 Diagnosis Description I87.332 Chronic venous hypertension (idiopathic) with ulcer and in extremity L97.222 Non-pressure chronic ulcer of  left calf with fat layer exp Modifier: flammation of le osed Quantity: 1 ft lower Electronic Signature(s) Signed:  08/20/2019 5:55:42 PM By: Linton Ham MD Entered By: Linton Ham on 08/20/2019 11:33:50

## 2019-08-24 NOTE — Progress Notes (Addendum)
[name], [name] ([id number]) Visit Report for 08/20/2019 Arrival Information Details Patient Name: Date of Service: [name], [name] 08/20/2019 10:15 AM Medical Record [id number] Patient Account Number: [id number] Date of Birth/Sex: Treating RN: 13-Nov-1925 (86 y.o. [name] Primary Care [name]: [name] Other Clinician: Referring [name]: Treating [name]/Extender:[name], [name] in Treatment: 1 Visit Information History Since Last Visit All ordered tests and consults were completed: No Patient Arrived: [name] Added or deleted any medications: No Arrival Time: 10:22 Any new allergies or adverse reactions: No Accompanied By: caregiver Had a fall or experienced change in No Transfer Assistance: None activities of daily living that may affect Patient Identification Verified: Yes risk of falls: Secondary Verification Process Yes Signs or symptoms of abuse/neglect since last No Completed: visito Patient Has Alerts: Yes Hospitalized since last visit: No Patient Alerts: Patient on Blood Implantable device outside of the clinic excluding No Thinner cellular tissue based products placed in the center Bilateral legs: [name] since last visit: Has Dressing in Place as Prescribed: Yes Has Compression in Place as Prescribed: Yes Pain Present Now: No Electronic Signature(s) Signed: 08/24/2019 5:23:18 PM By: Mary Coria RN Entered By: [name] on 08/20/2019 10:35:11 -------------------------------------------------------------------------------- Compression Therapy Details Patient Name: Date of Service: [name] 08/20/2019 10:15 AM Medical Record [id number] Patient Account Number: [id number] Date of Birth/Sex: Treating RN: Nov 15, 1925 (86 y.o. [name] Primary Care Tarvaris Puglia: [name] Other Clinician: Referring Maleiya Pergola: Treating [name]/Extender:[name], [name] in  Treatment: 1 Compression Therapy Performed for Wound Wound #6 Left,Lateral Lower Leg Assessment: Performed By: Clinician Mary Pilling, RN Compression Type: Three Layer Post Procedure Diagnosis Same as Pre-procedure Electronic Signature(s) Signed: 08/20/2019 5:49:40 PM By: [name] Entered By: [name] on 08/20/2019 10:56:44 -------------------------------------------------------------------------------- Encounter Discharge Information Details Patient Name: Date of Service: [name], [name] 08/20/2019 10:15 AM Medical Record [id number] Patient Account Number: [id number] Date of Birth/Sex: Treating RN: [birth year] (86 y.o. [name] Primary Care [name]: [name] Other Clinician: Referring Terralyn Matsumura: Treating [name]/Extender:[name], [name] in Treatment: 1 Encounter Discharge Information Items Discharge Condition: Stable Ambulatory Status: Walker Discharge Destination: Home Transportation: Private Auto Accompanied By: caregiver Schedule Follow-up Appointment: Yes Clinical Summary of Care: Electronic Signature(s) Signed: 08/20/2019 6:00:37 PM By: [name] Entered By: [name] on 08/20/2019 11:20:49 -------------------------------------------------------------------------------- Lower Extremity Assessment Details Patient Name: Date of Service: [name], [name] 08/20/2019 10:15 AM Medical Record [id number] Patient Account Number: [id number] Date of Birth/Sex: Treating RN: [birth year] (86 y.o. [name] Primary Care [name]: [name] Other Clinician: Referring [name]: Treating [name]/Extender:[name], [name] in Treatment: 1 Edema Assessment Assessed: [Left: No] [Right: No] Edema: [Left: N] [Right: o] Calf Left: Right: Point of Measurement: 37 cm From Medial Instep 23 cm cm Ankle Left: Right: Point of Measurement: 15 cm From Medial Instep 18 cm  cm Electronic Signature(s) Signed: 08/24/2019 5:23:18 PM By: Mary Coria RN Entered By: [name] on 08/20/2019 10:36:06 -------------------------------------------------------------------------------- Multi Wound Chart Details Patient Name: Date of Service: [name] 08/20/2019 10:15 AM Medical Record [id number] Patient Account Number: [id number] Date of Birth/Sex: Treating RN: 1926-03-03 (86 y.o. F) Primary Care [name]: [name] Other Clinician: Referring [name]: Treating [name]/Extender:[name], [name] in Treatment: 1 Vital Signs Height(in): 63 Pulse(bpm): 57 Weight(lbs): 101 Blood Pressure(mmHg): 176/88 Body Mass Index(BMI): 18 Temperature(F): 98.3 Respiratory 20 Rate(breaths/min): Photos: [6:No Photos] [N/A:N/A] Wound Location: [6:Left Lower Leg - Lateral] [N/A:N/A] Wounding Event: [6:Gradually  Appeared] [N/A:N/A] Primary Etiology: [6:Venous Leg Ulcer] [N/A:N/A] Comorbid History: [6:Cataracts, Middle ear problems, Anemia, Arrhythmia, Deep Vein Thrombosis, Hypertension, Colitis, End Stage Renal Disease, Rheumatoid Arthritis, Osteoarthritis, Received Chemotherapy] [N/A:N/A] Date Acquired: [6:06/22/2019] [N/A:N/A] Weeks of Treatment: [6:1] [N/A:N/A] Wound Status: [6:Open] [N/A:N/A] Measurements L x W x D 1.5x1.7x0.1 [N/A:N/A] (cm) Area (cm) : [6:2.003] [N/A:N/A] Volume (cm) : [6:0.2] [N/A:N/A] % Reduction in Area: [6:49.60%] [N/A:N/A] % Reduction in Volume: 49.60% [N/A:N/A] Classification: [6:Full Thickness Without Exposed Support Structures] [N/A:N/A] Exudate Amount: [6:Medium] [N/A:N/A] Exudate Type: [6:Serosanguineous] [N/A:N/A] Exudate Color: [6:red, brown] [N/A:N/A] Wound Margin: [6:Distinct, outline attached N/A] Granulation Amount: [6:Large (67-100%)] [N/A:N/A] Granulation Quality: [6:Red, Pink] [N/A:N/A] Necrotic Amount: [6:None Present (0%)] [N/A:N/A] Exposed Structures: [6:Fat Layer (Subcutaneous  N/A Tissue) Exposed: Yes Fascia: No Tendon: No Muscle: No Joint: No Bone: No] Epithelialization: [6:Medium (34-66%) Compression Therapy] [N/A:N/A N/A] Treatment Notes Wound #6 (Left, Lateral Lower Leg) 1. Cleanse With Wound Cleanser Soap and water 2. Periwound Care Moisturizing lotion 3. Primary Dressing Applied Calcium Alginate Ag 4. Secondary Dressing Dry Gauze 6. Support Layer Applied 3 layer compression wrap Notes netting. Electronic Signature(s) Signed: 08/20/2019 5:55:42 PM By: Linton Ham MD Entered By: Linton Ham on 08/20/2019 11:29:14 -------------------------------------------------------------------------------- Multi-Disciplinary Care Plan Details Patient Name: Date of Service: [name], [name] 08/20/2019 10:15 AM Medical Record [id number] Patient Account Number: [id number] Date of Birth/Sex: Treating RN: 09/10/1925 (86 y.o. [name] Primary Care Lilliemae Fruge: [name] Other Clinician: Referring Nereida Schepp: Treating Haya Hemler/Extender:[name], [name] in Treatment: 1 Active Inactive Nutrition Nursing Diagnoses: Potential for alteratiion in Nutrition/Potential for imbalanced nutrition Goals: Patient/caregiver agrees to and verbalizes understanding of need to obtain nutritional consultation Date Initiated: 08/12/2019 Target Resolution Date: 08/27/2019 Goal Status: Active Interventions: Provide education on nutrition Treatment Activities: Education provided on Nutrition : 08/12/2019 Patient referred to Primary Care Physician for further nutritional evaluation : 08/12/2019 Notes: Orientation to the Wound Care Program Nursing Diagnoses: Knowledge deficit related to the wound healing center program Goals: Patient/caregiver will verbalize understanding of the [facility] Date Initiated: 08/12/2019 Target Resolution Date: 08/27/2019 Goal Status: Active Interventions: Provide education on  orientation to the wound center Notes: Pain, Acute or Chronic Nursing Diagnoses: Pain, acute or chronic: actual or potential Potential alteration in comfort, pain Goals: Patient will verbalize adequate pain control and receive pain control interventions during procedures as needed Date Initiated: 08/12/2019 Target Resolution Date: 08/27/2019 Goal Status: Active Patient/caregiver will verbalize comfort level met Date Initiated: 08/12/2019 Target Resolution Date: 08/27/2019 Goal Status: Active Interventions: Encourage patient to take pain medications as prescribed Provide education on pain management Reposition patient for comfort Treatment Activities: Administer pain control measures as ordered : 08/12/2019 Notes: Wound/Skin Impairment Nursing Diagnoses: Knowledge deficit related to ulceration/compromised skin integrity Goals: Patient/caregiver will verbalize understanding of skin care regimen Date Initiated: 08/12/2019 Target Resolution Date: 08/27/2019 Goal Status: Active Ulcer/skin breakdown will heal within 14 weeks Date Initiated: 08/12/2019 Target Resolution Date: 10/29/2019 Goal Status: Active Interventions: Assess patient/caregiver ability to obtain necessary supplies Assess patient/caregiver ability to perform ulcer/skin care regimen upon admission and as needed Provide education on ulcer and skin care Treatment Activities: Skin care regimen initiated : 08/12/2019 Topical wound management initiated : 08/12/2019 Notes: Electronic Signature(s) Signed: 08/20/2019 5:49:40 PM By: [name] Entered By: [name] on 08/20/2019 10:39:10 -------------------------------------------------------------------------------- Pain Assessment Details Patient Name: Date of Service: [name], [name] 08/20/2019 10:15 AM Medical Record [id number] Patient Account Number: [id number] Date of Birth/Sex: Treating RN: 04-18-1926 (86 y.o. [name] Primary Care  Arretta Toenjes:  [name] Other Clinician: Referring Torrie Namba: Treating Dowell Hoon/Extender:[name], [name] in Treatment: 1 Active Problems Location of Pain Severity and Description of Pain Patient Has Paino No Site Locations Pain Management and Medication Current Pain Management: Electronic Signature(s) Signed: 08/24/2019 5:23:18 PM By: Mary Coria RN Entered By: [name] on 08/20/2019 10:35:53 -------------------------------------------------------------------------------- Patient/Caregiver Education Details Patient Name: Date of Service: [name] 1/29/2021andnbsp10:15 AM Medical Record Patient Account Number: [id number] Number: Treating RN: [name] Date of Birth/Gender: Dec 28, 1925 (86 y.o. F) Other Clinician: Primary Care Physician: Dietrich Pates Referring Physician: Physician/Extender: Nena Alexander in Treatment: 1 Education Assessment Education Provided To: Patient Education Topics Provided Nutrition: Methods: Explain/Verbal Responses: State content correctly Pain: Methods: Explain/Verbal Responses: State content correctly Welcome To [facility]: Methods: Explain/Verbal Responses: State content correctly Wound/Skin Impairment: Methods: Explain/Verbal Responses: State content correctly Electronic Signature(s) Signed: 08/20/2019 5:49:40 PM By: [name] Entered By: [name] on 08/20/2019 10:39:33 -------------------------------------------------------------------------------- Wound Assessment Details Patient Name: Date of Service: [name], [name] 08/20/2019 10:15 AM Medical Record [id number] Patient Account Number: [id number] Date of Birth/Sex: Treating RN: 1925-08-21 (86 y.o. F) Primary Care Ayame Rena: [name] Other Clinician: Referring Sanela Evola: Treating Jeydan Barner/Extender:[name], [name] in Treatment:  1 Wound Status Wound Number: 6 Primary Venous Leg Ulcer Etiology: Wound Location: Left Lower Leg - Lateral Wound Open Wounding Event: Gradually Appeared Status: Date Acquired: 06/22/2019 Comorbid Cataracts, Middle ear problems, Anemia, Weeks Of Treatment: 1 History: Arrhythmia, Deep Vein Thrombosis, Clustered Wound: No Hypertension, Colitis, End Stage Renal Disease, Rheumatoid Arthritis, Osteoarthritis, Received Chemotherapy Photos Wound Measurements Length: (cm) 1.5 % Reduct Width: (cm) 1.7 % Reduct Depth: (cm) 0.1 Epitheli Area: (cm) 2.003 Tunneli Volume: (cm) 0.2 Undermi Wound Description Full Thickness Without Exposed Support Foul Od Classification: Structures Slough/ Wound Distinct, outline attached Margin: Exudate Medium Amount: Exudate Exudate Serosanguineous Type: Exudate red, brown Color: Wound Bed Granulation Amount: Large (67-100%) Granulation Quality: Red, Pink Fascia Ex Necrotic Amount: None Present (0%) Fat Layer Tendon Ex Muscle Ex Joint Exp Bone Expo or After Cleansing: No Fibrino No Exposed Structure posed: No (Subcutaneous Tissue) Exposed: Yes posed: No posed: No osed: No sed: No ion in Area: 49.6% ion in Volume: 49.6% alization: Medium (34-66%) ng: No ning: No Treatment Notes Wound #6 (Left, Lateral Lower Leg) 1. Cleanse With Wound Cleanser Soap and water 2. Periwound Care Moisturizing lotion 3. Primary Dressing Applied Calcium Alginate Ag 4. Secondary Dressing Dry Gauze 6. Support Layer Applied 3 layer compression wrap Notes netting. Electronic Signature(s) Signed: 08/31/2019 4:28:10 PM By: Mikeal Hawthorne EMT/HBOT Previous Signature: 08/24/2019 5:23:18 PM Version By: Mary Coria RN Entered By: Mikeal Hawthorne on 08/31/2019 14:28:43 -------------------------------------------------------------------------------- Vitals Details Patient Name: Date of Service: [name], [name] 08/20/2019 10:15 AM Medical Record  [id number] Patient Account Number: [id number] Date of Birth/Sex: Treating RN: 13-Jun-1926 (86 y.o. [name] Primary Care Stpehen Petitjean: [name] Other Clinician: Referring Rainelle Sulewski: Treating Daelan Gatt/Extender:[name], [name] in Treatment: 1 Vital Signs Time Taken: 10:35 Temperature (F): 98.3 Height (in): 63 Pulse (bpm): 57 Weight (lbs): 101 Respiratory Rate (breaths/min): 20 Body Mass Index (BMI): 17.9 Blood Pressure (mmHg): 176/88 Reference Range: 80 - 120 mg / dl Electronic Signature(s) Signed: 08/24/2019 5:23:18 PM By: Mary Coria RN Entered By: [name] on 08/20/2019 10:35:46

## 2019-08-27 ENCOUNTER — Other Ambulatory Visit: Payer: Self-pay

## 2019-08-27 ENCOUNTER — Encounter (HOSPITAL_BASED_OUTPATIENT_CLINIC_OR_DEPARTMENT_OTHER): Payer: Medicare Other | Admitting: Internal Medicine

## 2019-08-27 DIAGNOSIS — Z7901 Long term (current) use of anticoagulants: Secondary | ICD-10-CM | POA: Insufficient documentation

## 2019-08-27 DIAGNOSIS — L97222 Non-pressure chronic ulcer of left calf with fat layer exposed: Secondary | ICD-10-CM | POA: Insufficient documentation

## 2019-08-27 DIAGNOSIS — I129 Hypertensive chronic kidney disease with stage 1 through stage 4 chronic kidney disease, or unspecified chronic kidney disease: Secondary | ICD-10-CM | POA: Diagnosis not present

## 2019-08-27 DIAGNOSIS — I87332 Chronic venous hypertension (idiopathic) with ulcer and inflammation of left lower extremity: Secondary | ICD-10-CM | POA: Insufficient documentation

## 2019-08-27 DIAGNOSIS — Z8572 Personal history of non-Hodgkin lymphomas: Secondary | ICD-10-CM | POA: Diagnosis not present

## 2019-08-27 DIAGNOSIS — I4891 Unspecified atrial fibrillation: Secondary | ICD-10-CM | POA: Diagnosis not present

## 2019-08-27 DIAGNOSIS — N189 Chronic kidney disease, unspecified: Secondary | ICD-10-CM | POA: Diagnosis not present

## 2019-08-27 NOTE — Progress Notes (Signed)
[name], [name] ([id number]) Visit Report for 08/27/2019 Arrival Information Details Patient Name: Date of Service: [name], [name] 08/27/2019 11:15 AM Medical Record [id number] Patient Account Number: [id number] Date of Birth/Sex: Treating RN: [birth year] (86 y.o. [name] Primary Care Latyra Jaye: Simona Huh Other Clinician: Referring Neeka Urista: Treating Wakisha Alberts/Extender:Robson, [name], [name] in Treatment: 2 Visit Information History Since Last Visit Added or deleted any medications: No Patient Arrived: [name] Any new allergies or adverse reactions: No Arrival Time: 11:52 Had a fall or experienced change in No Accompanied By: caregiver activities of daily living that may affect Transfer Assistance: None risk of falls: Patient Identification Verified: Yes Signs or symptoms of abuse/neglect since last No Secondary Verification Process Yes visito Completed: Hospitalized since last visit: No Patient Requires Transmission- No Implantable device outside of the clinic excluding No Based Precautions: cellular tissue based products placed in the center Patient Has Alerts: Yes since last visit: Patient Alerts: Patient on Blood Has Dressing in Place as Prescribed: Yes Thinner Has Compression in Place as Prescribed: Yes Bilateral legs: Pain Present Now: No [place] Electronic Signature(s) Signed: 08/27/2019 4:26:03 PM By: Baruch Gouty RN, BSN Entered By: Baruch Gouty on 08/27/2019 11:54:49 -------------------------------------------------------------------------------- Compression Therapy Details Patient Name: Date of Service: Bluitt, Chriselda P. 08/27/2019 11:15 AM Medical Record [id number] Patient Account Number: [id number] Date of Birth/Sex: Treating RN: 02-Jul-1926 (86 y.o. [name] Primary Care Eriko Economos: Simona Huh Other Clinician: Referring Cristabel Bicknell: Treating Angelik Walls/Extender:Robson, [name], [name] in Treatment: 2 Compression Therapy Performed for Wound Wound #6 Left,Lateral Lower Leg Assessment: Performed By: Clinician Baruch Gouty, RN Compression Type: Three Hydrologist) Signed: 08/27/2019 4:26:03 PM By: Baruch Gouty RN, BSN Entered By: Baruch Gouty on 08/27/2019 12:09:22 -------------------------------------------------------------------------------- Encounter Discharge Information Details Patient Name: Date of Service: [name], [name] 08/27/2019 11:15 AM Medical Record [id number] Patient Account Number: [id number] Date of Birth/Sex: Treating RN: Apr 25, 1926 (86 y.o. [name] Primary Care Zaydee Aina: Simona Huh Other Clinician: Referring Cailyn Houdek: Treating Kashmere Daywalt/Extender:Robson, [name], [name] in Treatment: 2 Encounter Discharge Information Items Discharge Condition: Stable Ambulatory Status: Walker Discharge Destination: Home Transportation: Private Auto Accompanied By: caregiver Schedule Follow-up Appointment: Yes Clinical Summary of Care: Patient Declined Electronic Signature(s) Signed: 08/27/2019 4:26:03 PM By: Baruch Gouty RN, BSN Entered By: Baruch Gouty on 08/27/2019 12:10:15 -------------------------------------------------------------------------------- Patient/Caregiver Education Details Patient Name: Date of Service: [name] 2/5/2021andnbsp11:15 AM Medical Record Patient Account Number: [id number] Number: Treating RN: Baruch Gouty Date of Birth/Gender: September 14, 1925 (86 y.o. Other Clinician: F) Treating Linton Ham Primary Care Physician: Simona Huh Physician/Extender: Referring Physician: Nena Alexander in Treatment: 2 Education Assessment Education Provided To: Patient Education Topics Provided Venous: Methods: Explain/Verbal Responses: Reinforcements needed, State content correctly Electronic Signature(s) Signed: 08/27/2019  4:26:03 PM By: Baruch Gouty RN, BSN Entered By: Baruch Gouty on 08/27/2019 12:10:00 -------------------------------------------------------------------------------- Wound Assessment Details Patient Name: Date of Service: [name], [name] 08/27/2019 11:15 AM Medical Record [id number] Patient Account Number: [id number] Date of Birth/Sex: Treating RN: [birth year] (86 y.o. [name] Primary Care Shaquavia Whisonant: Simona Huh Other Clinician: Referring Leya Paige: Treating Andreus Cure/Extender:Robson, [name], [name] in Treatment: 2 Wound Status Wound Number: 6 Primary Etiology: Venous Leg Ulcer Wound Location: Left, Lateral Lower Leg Wound Status: Open Wounding Event: Gradually Appeared Date Acquired: 06/22/2019 Weeks Of Treatment: 2 Clustered Wound: No Wound Measurements Length: (cm) 1.2 % Reduct Width: (cm) 1.5 % Reduct Depth: (cm) 0.1 Area: (cm) 1.414 Volume: (cm) 0.141 Wound Description  Classification: Full Thickness Without Exposed Support Structures ion in Area: 64.4% ion in Volume: 64.5% Treatment Notes Wound #6 (Left, Lateral Lower Leg) 1. Cleanse With Wound Cleanser Soap and water 2. Periwound Care Moisturizing lotion 3. Primary Dressing Applied Calcium Alginate Ag 4. Secondary Dressing Dry Gauze 6. Support Layer Applied 3 layer compression wrap Notes netting. Electronic Signature(s) Signed: 08/27/2019 4:26:03 PM By: Baruch Gouty RN, BSN Entered By: Baruch Gouty on 08/27/2019 12:01:41 -------------------------------------------------------------------------------- [place] Details Patient Name: Date of Service: [name], [name] 08/27/2019 11:15 AM Medical Record [id number] Patient Account Number: [id number] Date of Birth/Sex: Treating RN: 05-24-1926 (86 y.o. [name] Primary Care Micheal Murad: Simona Huh Other Clinician: Referring Usman Millett: Treating Nolah Krenzer/Extender:Robson, [name], [name] in Treatment: 2 Vital Signs Time Taken: 11:54 Temperature (F): 98.4 Height (in): 63 Pulse (bpm): 51 Source: Stated Respiratory Rate (breaths/min): 18 Weight (lbs): 101 Blood Pressure (mmHg): 154/70 Source: Stated Reference Range: 80 - 120 mg / dl Body Mass Index (BMI): 17.9 Electronic Signature(s) Signed: 08/27/2019 4:26:03 PM By: Baruch Gouty RN, BSN Entered By: Baruch Gouty on 08/27/2019 11:56:06

## 2019-08-27 NOTE — Progress Notes (Signed)
[name], [name] ([id number]) Visit Report for 08/27/2019 SuperBill Details Patient Name: Date of Service: [name], [name] 08/27/2019 Medical Record [id number] Patient Account Number: [id number] Date of Birth/Sex: Treating RN: [birth year] (86 y.o. [name] Primary Care Provider: Simona Huh Other Clinician: Referring Provider: Treating Provider/Extender:Rohin Krejci, [name], [name] in Treatment: 2 Diagnosis Coding ICD-10 Codes Code Description [id number] Chronic venous hypertension (idiopathic) with ulcer and inflammation of left lower extremity L97.222 Non-pressure chronic ulcer of left calf with fat layer exposed Facility Procedures The patient participates with Medicare or their insurance follows the Medicare Facility Guidelines CPT4 Code Description Modifier Quantity [id number] (Facility Use Only) [phone] - [place] 1 Electronic Signature(s) Signed: 08/27/2019 4:26:03 PM By: Baruch Gouty RN, BSN Signed: 08/27/2019 5:45:44 PM By: Linton Ham MD Entered By: Baruch Gouty on 08/27/2019 12:10:27

## 2019-09-03 ENCOUNTER — Other Ambulatory Visit: Payer: Self-pay

## 2019-09-03 ENCOUNTER — Encounter (HOSPITAL_BASED_OUTPATIENT_CLINIC_OR_DEPARTMENT_OTHER): Payer: Medicare Other | Attending: Internal Medicine | Admitting: Internal Medicine

## 2019-09-03 DIAGNOSIS — I87332 Chronic venous hypertension (idiopathic) with ulcer and inflammation of left lower extremity: Secondary | ICD-10-CM | POA: Diagnosis not present

## 2019-09-06 NOTE — Progress Notes (Signed)
KYNNEDY, CARRENO ([id number]) Visit Report for 09/03/2019 Arrival Information Details Patient Name: Date of Service: [name], [name] 09/03/2019 10:15 AM Medical Record [id number] Patient Account Number: [id number] Date of Birth/Sex: Treating RN: Sep 16, 1925 (86 y.o. [name] Primary Care Tywanda Rice: Simona Huh Other Clinician: Referring Rafferty Postlewait: Treating Jinger Middlesworth/Extender:Robson, [name], [name] in Treatment: 3 Visit Information History Since Last Visit Added or deleted any medications: No Patient Arrived: [name] Any new allergies or adverse reactions: No Arrival Time: 10:54 Had a fall or experienced change in No Accompanied By: caregiver activities of daily living that may affect Transfer Assistance: None risk of falls: Patient Identification Verified: Yes Signs or symptoms of abuse/neglect since last No Secondary Verification Process Yes visito Completed: Hospitalized since last visit: No Patient Requires Transmission- No Implantable device outside of the clinic excluding No Based Precautions: cellular tissue based products placed in the center Patient Has Alerts: Yes Patient on Blood since last visit: Patient Alerts: Has Dressing in Place as Prescribed: Yes Thinner Bilateral legs: Union Has Compression in Place as Prescribed: Yes Pain Present Now: No Electronic Signature(s) Signed: 09/03/2019 5:30:09 PM By: Baruch Gouty RN, BSN Entered By: Baruch Gouty on 09/03/2019 10:55:22 -------------------------------------------------------------------------------- Compression Therapy Details Patient Name: Date of Service: [name], Mary P. 09/03/2019 10:15 AM Medical Record [id number] Patient Account Number: [id number] Date of Birth/Sex: Treating RN: October 15, 1925 (86 y.o. [name] Primary Care Martine Bleecker: Simona Huh Other Clinician: Referring Charna Neeb: Treating Kaysi Ourada/Extender:Robson, [name], [name] in Treatment: 3 Compression Therapy Performed for Wound Wound #6 Left,Lateral Lower Leg Assessment: Performed By: Clinician Deon Pilling, RN Compression Type: Three Layer Post Procedure Diagnosis Same as Pre-procedure Electronic Signature(s) Signed: 09/06/2019 6:12:41 PM By: Kela Millin Entered By: Kela Millin on 09/03/2019 11:30:39 -------------------------------------------------------------------------------- Encounter Discharge Information Details Patient Name: Date of Service: [name], [name] 09/03/2019 10:15 AM Medical Record [id number] Patient Account Number: [id number] Date of Birth/Sex: Treating RN: [birth year] (86 y.o. [name] Primary Care Michalle Rademaker: Simona Huh Other Clinician: Referring Willadean Guyton: Treating Maryalyce Sanjuan/Extender:Robson, [name], [name] in Treatment: 3 Encounter Discharge Information Items Discharge Condition: Stable Ambulatory Status: Wheelchair Discharge Destination: Home Transportation: Private Auto Accompanied By: caregiver Schedule Follow-up Appointment: Yes Clinical Summary of Care: Electronic Signature(s) Signed: 09/03/2019 5:41:48 PM By: Deon Pilling Entered By: Deon Pilling on 09/03/2019 11:43:18 -------------------------------------------------------------------------------- Lower Extremity Assessment Details Patient Name: Date of Service: [name], [name] 09/03/2019 10:15 AM Medical Record [id number] Patient Account Number: [id number] Date of Birth/Sex: Treating RN: Dec 04, 1925 (86 y.o. [name] Primary Care Johnaton Sonneborn: Simona Huh Other Clinician: Referring Coyt Govoni: Treating Rhythm Wigfall/Extender:Robson, [name], [name] in Treatment: 3 Edema Assessment Assessed: [Left: No] [Right: No] Edema: [Left: N] [Right: o] Calf Left: Right: Point of Measurement: 37 cm From Medial Instep 23.5 cm cm Ankle Left: Right: Point of Measurement: 15 cm From  Medial Instep 17.5 cm cm Vascular Assessment Pulses: Dorsalis Pedis Palpable: [Left:Yes] Electronic Signature(s) Signed: 09/03/2019 5:30:09 PM By: Baruch Gouty RN, BSN Entered By: Baruch Gouty on 09/03/2019 10:56:12 -------------------------------------------------------------------------------- Multi Wound Chart Details Patient Name: Date of Service: [name], Mary P. 09/03/2019 10:15 AM Medical Record [id number] Patient Account Number: [id number] Date of Birth/Sex: Treating RN: 19-Jun-1926 (86 y.o. [name] Primary Care Tavari Loadholt: Simona Huh Other Clinician: Referring Ritesh Opara: Treating Anjanette Gilkey/Extender:Robson, [name], [name] in Treatment: 3 Vital Signs Height(in): 63 Pulse(bpm): 49 Weight(lbs): 101 Blood Pressure(mmHg): 164/60 Body Mass Index(BMI): 18 Temperature(F): 98.2 Respiratory 18 Rate(breaths/min): Photos: [6:No Photos] [N/A:N/A] Wound Location: [  6:Left Lower Leg - Lateral] [N/A:N/A] Wounding Event: [6:Gradually Appeared] [N/A:N/A] Primary Etiology: [6:Venous Leg Ulcer] [N/A:N/A] Comorbid History: [6:Cataracts, Middle ear problems, Anemia, Arrhythmia, Deep Vein Thrombosis, Hypertension, Colitis, End Stage Renal Disease, Rheumatoid Arthritis, Osteoarthritis, Received Chemotherapy] [N/A:N/A] Date Acquired: [6:06/22/2019] [N/A:N/A] Weeks of Treatment: [6:3] [N/A:N/A] Wound Status: [6:Open] [N/A:N/A] Measurements L x W x D 0.9x0.8x0.1 [N/A:N/A] (cm) Area (cm) : [6:0.565] [N/A:N/A] Volume (cm) : [6:0.057] [N/A:N/A] % Reduction in Area: [6:85.80%] [N/A:N/A] % Reduction in Volume: [6:85.60%] [N/A:N/A] Classification: [6:Full Thickness Without Exposed Support Structures] [N/A:N/A] Exudate Amount: [6:Small] [N/A:N/A] Exudate Type: [6:Serosanguineous] [N/A:N/A] Exudate Color: [6:red, brown] [N/A:N/A] Wound Margin: [6:Flat and Intact] [N/A:N/A] Granulation Amount: [6:Large (67-100%)] [N/A:N/A] Granulation Quality:  [6:Red] [N/A:N/A] Necrotic Amount: [6:None Present (0%)] [N/A:N/A] Exposed Structures: [6:Fat Layer (Subcutaneous N/A Tissue) Exposed: Yes Fascia: No Tendon: No Muscle: No Joint: No Bone: No] Epithelialization: [6:Medium (34-66%) Compression Therapy] [N/A:N/A N/A] Treatment Notes Wound #6 (Left, Lateral Lower Leg) 1. Cleanse With Wound Cleanser Soap and water 2. Periwound Care Moisturizing lotion 3. Primary Dressing Applied Calcium Alginate Ag 4. Secondary Dressing Dry Gauze 6. Support Layer Applied 3 layer compression wrap Notes netting. Electronic Signature(s) Signed: 09/03/2019 5:45:04 PM By: Linton Ham MD Signed: 09/06/2019 6:12:41 PM By: Kela Millin Entered By: Linton Ham on 09/03/2019 12:50:54 -------------------------------------------------------------------------------- Multi-Disciplinary Care Plan Details Patient Name: Date of Service: [name], [name] 09/03/2019 10:15 AM Medical Record [id number] Patient Account Number: [id number] Date of Birth/Sex: Treating RN: May 06, 1926 (86 y.o. [name] Primary Care Darlean Warmoth: Simona Huh Other Clinician: Referring Kinza Gouveia: Treating Rilee Knoll/Extender:Robson, [name], [name] in Treatment: 3 Active Inactive Nutrition Nursing Diagnoses: Potential for alteratiion in Nutrition/Potential for imbalanced nutrition Goals: Patient/caregiver agrees to and verbalizes understanding of need to obtain nutritional consultation Date Initiated: 08/12/2019 Target Resolution Date: 10/01/2019 Goal Status: Active Interventions: Provide education on nutrition Treatment Activities: Education provided on Nutrition : 08/20/2019 Patient referred to Primary Care Physician for further nutritional evaluation : 08/12/2019 Notes: Pain, Acute or Chronic Nursing Diagnoses: Pain, acute or chronic: actual or potential Potential alteration in comfort, pain Goals: Patient will verbalize adequate pain  control and receive pain control interventions during procedures as needed Date Initiated: 08/12/2019 Target Resolution Date: 10/01/2019 Goal Status: Active Patient/caregiver will verbalize comfort level met Date Initiated: 08/12/2019 Target Resolution Date: 10/01/2019 Goal Status: Active Interventions: Encourage patient to take pain medications as prescribed Provide education on pain management Reposition patient for comfort Treatment Activities: Administer pain control measures as ordered : 08/12/2019 Notes: Wound/Skin Impairment Nursing Diagnoses: Knowledge deficit related to ulceration/compromised skin integrity Goals: Patient/caregiver will verbalize understanding of skin care regimen Date Initiated: 08/12/2019 Target Resolution Date: 10/01/2019 Goal Status: Active Ulcer/skin breakdown will heal within 14 weeks Date Initiated: 08/12/2019 Target Resolution Date: 10/29/2019 Goal Status: Active Interventions: Assess patient/caregiver ability to obtain necessary supplies Assess patient/caregiver ability to perform ulcer/skin care regimen upon admission and as needed Provide education on ulcer and skin care Treatment Activities: Skin care regimen initiated : 08/12/2019 Topical wound management initiated : 08/12/2019 Notes: Electronic Signature(s) Signed: 09/06/2019 6:12:41 PM By: Kela Millin Entered By: Kela Millin on 09/03/2019 11:25:26 -------------------------------------------------------------------------------- Pain Assessment Details Patient Name: Date of Service: [name], [name] 09/03/2019 10:15 AM Medical Record [id number] Patient Account Number: [id number] Date of Birth/Sex: Treating RN: Jun 15, 1926 (86 y.o. [name] Primary Care Keary Waterson: Simona Huh Other Clinician: Referring Raza Bayless: Treating Chenae Brager/Extender:Robson, [name], [name] in Treatment: 3 Active Problems Location of Pain Severity and Description of  Pain Patient Has Paino No  Site Locations Rate the pain. Current Pain Level: 0 Pain Management and Medication Current Pain Management: Electronic Signature(s) Signed: 09/03/2019 5:30:09 PM By: Baruch Gouty RN, BSN Entered By: Baruch Gouty on 09/03/2019 10:55:54 -------------------------------------------------------------------------------- Patient/Caregiver Education Details Patient Name: Date of Service: [name] 2/12/2021andnbsp10:15 AM Medical Record Patient Account Number: [id number] Number: Treating RN: Kela Millin Date of Birth/Gender: Dec 18, 1925 (86 y.o. F) Other Clinician: Primary Care Physician: Dietrich Pates Referring Physician: Physician/Extender: Nena Alexander in Treatment: 3 Education Assessment Education Provided To: Patient Education Topics Provided Nutrition: Methods: Explain/Verbal Responses: State content correctly Pain: Methods: Explain/Verbal Responses: State content correctly Wound/Skin Impairment: Methods: Explain/Verbal Responses: State content correctly Electronic Signature(s) Signed: 09/06/2019 6:12:41 PM By: Kela Millin Entered By: Kela Millin on 09/03/2019 11:29:15 -------------------------------------------------------------------------------- Wound Assessment Details Patient Name: Date of Service: [name], [name] 09/03/2019 10:15 AM Medical Record [id number] Patient Account Number: [id number] Date of Birth/Sex: Treating RN: 1925-11-14 (86 y.o. [name] Primary Care Camey Edell: Simona Huh Other Clinician: Referring Averleigh Savary: Treating Earlyn Sylvan/Extender:Robson, [name], [name] in Treatment: 3 Wound Status Wound Number: 6 Primary Venous Leg Ulcer Etiology: Wound Location: Left, Lateral Lower Leg Wound Open Wounding Event: Gradually Appeared Status: Date Acquired: 06/22/2019 Comorbid Cataracts, Middle ear problems,  Anemia, Weeks Of Treatment: 3 History: Arrhythmia, Deep Vein Thrombosis, Clustered Wound: No Hypertension, Colitis, End Stage Renal Disease, Rheumatoid Arthritis, Osteoarthritis, Received Chemotherapy Photos Wound Measurements Length: (cm) 0.9 % Reduct Width: (cm) 0.8 % Reduct Depth: (cm) 0.1 Epitheli Area: (cm) 0.565 Tunneli Volume: (cm) 0.057 Undermi Wound Description Classification: Full Thickness Without Exposed Support Foul Odo Structures Slough/F Wound Flat and Intact Margin: Exudate Small Amount: Exudate Serosanguineous Type: Exudate red, brown Color: Wound Bed Granulation Amount: Large (67-100%) Granulation Quality: Red Fascia E Necrotic Amount: None Present (0%) Fat Laye Tendon E Muscle E Joint Ex Bone Exp r After Cleansing: No ibrino No Exposed Structure xposed: No r (Subcutaneous Tissue) Exposed: Yes xposed: No xposed: No posed: No osed: No ion in Area: 85.8% ion in Volume: 85.6% alization: Medium (34-66%) ng: No ning: No Treatment Notes Wound #6 (Left, Lateral Lower Leg) 1. Cleanse With Wound Cleanser Soap and water 2. Periwound Care Moisturizing lotion 3. Primary Dressing Applied Calcium Alginate Ag 4. Secondary Dressing Dry Gauze 6. Support Layer Applied 3 layer compression wrap Notes netting. Electronic Signature(s) Signed: 09/03/2019 4:39:06 PM By: Mikeal Hawthorne EMT/HBOT Signed: 09/06/2019 6:12:41 PM By: Kela Millin Entered By: Mikeal Hawthorne on 09/03/2019 14:28:03 -------------------------------------------------------------------------------- Vitals Details Patient Name: Date of Service: [name], [name] 09/03/2019 10:15 AM Medical Record [id number] Patient Account Number: [id number] Date of Birth/Sex: Treating RN: 1925-08-01 (86 y.o. [name] Primary Care Antara Brecheisen: Simona Huh Other Clinician: Referring Jaritza Duignan: Treating Eudora Guevarra/Extender:Robson, [name], [name] in  Treatment: 3 Vital Signs Time Taken: 10:55 Temperature (F): 98.2 Height (in): 63 Pulse (bpm): 49 Source: Stated Respiratory Rate (breaths/min): 18 Weight (lbs): 101 Blood Pressure (mmHg): 164/60 Source: Stated Reference Range: 80 - 120 mg / dl Body Mass Index (BMI): 17.9 Electronic Signature(s) Signed: 09/03/2019 5:30:09 PM By: Baruch Gouty RN, BSN Entered By: Baruch Gouty on 09/03/2019 10:55:46

## 2019-09-06 NOTE — Progress Notes (Signed)
HILDAGARDE, HOLLERAN ([id number]) Visit Report for 09/03/2019 HPI Details Patient Name: Date of Service: [name], SCHRECKENGOST 09/03/2019 10:15 AM Medical Record [id number] Patient Account Number: [id number] Date of Birth/Sex: Treating RN: [birth year] (86 y.o. [name] Primary Care Provider: Simona Huh Other Clinician: Referring Provider: Treating Provider/Extender:Kenard Morawski, [name], [name] in Treatment: 3 History of Present Illness HPI Description: 12/04/2018 ADMISSION This is a 86 year old woman who lives independently. She was recently hospitalized from 09/04/2018 through 09/08/2018 and then had an SNF stay at [facility] for a predominant fracture of her distal right humerus. She lives at home within 65 year old husband. Since her arrival home she is apparently had a series of trauma on various wheelchairs resulting in 3 wounds on the front of her right lower leg one on the posterior part of the right leg and one small area on the left anterior leg. They have been dressing this with a foam-based dressing. She apparently has a longstanding history of edema in her lower legs. I note from reviewing her records she has chronic kidney disease COPD A. fib on Eliquis but no mention of a cause of lower extremity edema Past medical history includes Alzheimer's disease, right humerus fracture as discussed, atrial fib, hypertension, history of B-cell lymphoma and MGUS, , COPD and chronic edema ABIs were noncompressible on both sides in our clinic 5/22-86 year old female was started in the wound care clinic last week for wounds on both lower legs. These were described as traumatic right anterior leg, right lateral leg, right distal lateral, right posterior and left leg wounds. With history of venous hypertension and edema in both legs. Arterial ABIs were noncompressible but pulses were palpable silver alginate was used with 3 layer compression 5/29; this patient has a  cluster of wounds on the right mid tibia area related to chronic venous insufficiency and lymphedema also a small area on the left anterior tibia area. We have her with silver alginate and 3 layer compression. The wounds generally look better 6/5; continued improvement on the left there is only a small area medially that is not completely epithelialized. The area on the right mid tibial area has 4 wounds including 3 anteriorly and one posteriorly. Most of these are smaller. We have been using silver alginate 6/12; all the wounds on the left leg are healed. The wounds on the right leg all look smaller and have healthy surfaces. We have been using HFB. She does not yet have a stocking for the left leg 6/19; the patient remains healed on the left leg. The wounds on the right leg are 4. The larger one medially to the tibia 1 over the tibia a small one just lateral to the tibia and a small one on the posterior right calf. Silver alginate. I will change her to [facility] 6/25; the patient remains healed on her left leg and she has a juxta lite stocking today. The wounds on the right leg are smaller and look healthy. We have been using Hydrofera Blue under compression 7/10; the patient has a juxta lite on her left leg. There is only one wound on the right anterior leg left and it looks healthy. Unfortunately home health did not wrap her leg high enough she has some significant edema above the wrap below the knee and some bruising on the posterior part of this but no additional open wound. 3 of the wounds on the right leg have healed 7/24; the patient has a juxta lite on her left  leg. She had a dry scaled area left of the area on the right anterior left lower leg. We have been using Hydrofera Blue under 3 layer compression 7/30; the area on the right medial lower leg is closed. We have been using Hydrofera Blue under 3 layer compression. She can graduate to a juxta lite to her right leg. There  lubricating skin READMISSION 08/12/2019 This is a 86 year old woman that we had in this clinic from May to July 2020. At that point she had wounds on her bilateral lower legs which were traumatic in the setting of edema and skin damage from chronic venous insufficiency. The more difficult wound was an area on the left medial calf but we eventually got this to close over and put her in bilateral juxta lite stockings. They have been compliant with the stockings and everything was going well. Unfortunately the patient was admitted to hospital from 07/19/2019 through 07/24/2019 with Covid and UTI with sepsis physiology. During this time she did not have compression on her legs and edema was fairly bad when she finally returned home. About a week later they noted an open area on the left lateral calf. They have been using Neosporin and her juxta lite stocking but is not closing. This is been open for about 1-1/2 months. The patient's past medical history is reviewed its essentially unchanged. She has atrial fibrillation on Eliquis she is not a diabetic. Although her ABIs are noncompressible bilaterally we have not felt that she had a significant arterial issue sufficient to inhibit healing of these largely venous wounds 1/29; the patient is actually doing quite well. Wound dimensions are down significantly. Her edema control is good. We have been using silver alginate under 3 layer compression. 2/12; wound dimensions continue to contract. Her edema control is good we have been using silver alginate under 3 layer compression Electronic Signature(s) Signed: 09/03/2019 5:45:04 PM By: Linton Ham MD Entered By: Linton Ham on 09/03/2019 12:51:36 -------------------------------------------------------------------------------- Physical Exam Details Patient Name: Date of Service: Economos, Grizelda P. 09/03/2019 10:15 AM Medical Record [id number] Patient Account Number: [id number] Date of  Birth/Sex: Treating RN: [birth year] (86 y.o. [name] Primary Care Provider: Simona Huh Other Clinician: Referring Provider: Treating Provider/Extender:Kelbie Moro, [name], [name] in Treatment: 3 Constitutional Patient is hypertensive.. Pulse regular and within target range for patient.Marland Kitchen Respirations regular, non-labored and within target range.. Temperature is normal and within the target range for the patient.Marland Kitchen Appears in no distress. Cardiovascular Peripheral pulses are palpable. Notes Wound exam; left lateral mid calf. This wound is making nice progress. Under illumination no debridement is necessary. Wound surface debrided with Anasept and gauze. No need for mechanical debridement. We have good edema control Electronic Signature(s) Signed: 09/03/2019 5:45:04 PM By: Linton Ham MD Entered By: Linton Ham on 09/03/2019 12:52:52 -------------------------------------------------------------------------------- Physician Orders Details Patient Name: Date of Service: CHAISE, PASSARELLA 09/03/2019 10:15 AM Medical Record [id number] Patient Account Number: [id number] Date of Birth/Sex: Treating RN: October 02, 1925 (86 y.o. [name] Primary Care Provider: Simona Huh Other Clinician: Referring Provider: Treating Provider/Extender:Avin Upperman, [name], [name] in Treatment: 3 Verbal / Phone Orders: No Diagnosis Coding ICD-10 Coding Code Description [phone] Chronic venous hypertension (idiopathic) with ulcer and inflammation of left lower extremity L97.222 Non-pressure chronic ulcer of left calf with fat layer exposed Follow-up Appointments Return Appointment in 1 week. Dressing Change Frequency Do not change entire dressing for one week. Skin Barriers/Peri-Wound Care Moisturizing lotion Wound Cleansing May shower with protection. -  use cast protector Primary Wound Dressing Wound #6 Left,Lateral Lower  Leg Calcium Alginate with Silver Secondary Dressing Wound #6 Left,Lateral Lower Leg Dry Gauze ABD pad Edema Control 3 Layer Compression System - Left Lower Extremity Patient to wear own compression stockings - patient to wear juxtalite to right leg only for now. Apply in the morning and remove at night. Avoid standing for long periods of time Elevate legs to the level of the heart or above for 30 minutes daily and/or when sitting, a frequency of: - throughout the day. Exercise regularly Electronic Signature(s) Signed: 09/03/2019 5:45:04 PM By: Linton Ham MD Signed: 09/06/2019 6:12:41 PM By: Kela Millin Entered By: Kela Millin on 09/03/2019 11:24:37 -------------------------------------------------------------------------------- Problem List Details Patient Name: Date of Service: JACQUEL, REDDITT 09/03/2019 10:15 AM Medical Record [id number] Patient Account Number: [id number] Date of Birth/Sex: Treating RN: [birth year] (85 y.o. [name] Primary Care Provider: Simona Huh Other Clinician: Referring Provider: Treating Provider/Extender:Corene Resnick, [name], [name] in Treatment: 3 Active Problems ICD-10 Evaluated Encounter Code Description Active Date Today Diagnosis I87.332 Chronic venous hypertension (idiopathic) with ulcer 08/12/2019 No Yes and inflammation of left lower extremity L97.222 Non-pressure chronic ulcer of left calf with fat layer 08/12/2019 No Yes exposed Inactive Problems Resolved Problems Electronic Signature(s) Signed: 09/03/2019 5:45:04 PM By: Linton Ham MD Entered By: Linton Ham on 09/03/2019 12:50:48 -------------------------------------------------------------------------------- Progress Note Details Patient Name: Date of Service: Lamba, Jeweliana P. 09/03/2019 10:15 AM Medical Record [id number] Patient Account Number: [id number] Date of Birth/Sex: Treating RN: 08-08-1925 (86 y.o. [name] Primary Care Provider: Simona Huh Other Clinician: Referring Provider: Treating Provider/Extender:Toshia Larkin, [name], [name] in Treatment: 3 Subjective History of Present Illness (HPI) 12/04/2018 ADMISSION This is a 86 year old woman who lives independently. She was recently hospitalized from 09/04/2018 through 09/08/2018 and then had an SNF stay at [facility] for a predominant fracture of her distal right humerus. She lives at home within 49 year old husband. Since her arrival home she is apparently had a series of trauma on various wheelchairs resulting in 3 wounds on the front of her right lower leg one on the posterior part of the right leg and one small area on the left anterior leg. They have been dressing this with a foam-based dressing. She apparently has a longstanding history of edema in her lower legs. I note from reviewing her records she has chronic kidney disease COPD A. fib on Eliquis but no mention of a cause of lower extremity edema Past medical history includes Alzheimer's disease, right humerus fracture as discussed, atrial fib, hypertension, history of B-cell lymphoma and MGUS, , COPD and chronic edema ABIs were noncompressible on both sides in our clinic 5/22-86 year old female was started in the wound care clinic last week for wounds on both lower legs. These were described as traumatic right anterior leg, right lateral leg, right distal lateral, right posterior and left leg wounds. With history of venous hypertension and edema in both legs. Arterial ABIs were noncompressible but pulses were palpable silver alginate was used with 3 layer compression 5/29; this patient has a cluster of wounds on the right mid tibia area related to chronic venous insufficiency and lymphedema also a small area on the left anterior tibia area. We have her with silver alginate and 3 layer compression. The wounds generally look better 6/5; continued  improvement on the left there is only a small area medially that is not completely epithelialized. ooThe area on the right mid tibial area has  4 wounds including 3 anteriorly and one posteriorly. Most of these are smaller. We have been using silver alginate 6/12; all the wounds on the left leg are healed. The wounds on the right leg all look smaller and have healthy surfaces. We have been using HFB. She does not yet have a stocking for the left leg 6/19; the patient remains healed on the left leg. The wounds on the right leg are 4. The larger one medially to the tibia 1 over the tibia a small one just lateral to the tibia and a small one on the posterior right calf. Silver alginate. I will change her to [facility] 6/25; the patient remains healed on her left leg and she has a juxta lite stocking today. The wounds on the right leg are smaller and look healthy. We have been using Hydrofera Blue under compression 7/10; the patient has a juxta lite on her left leg. There is only one wound on the right anterior leg left and it looks healthy. Unfortunately home health did not wrap her leg high enough she has some significant edema above the wrap below the knee and some bruising on the posterior part of this but no additional open wound. 3 of the wounds on the right leg have healed 7/24; the patient has a juxta lite on her left leg. She had a dry scaled area left of the area on the right anterior left lower leg. We have been using Hydrofera Blue under 3 layer compression 7/30; the area on the right medial lower leg is closed. We have been using Hydrofera Blue under 3 layer compression. She can graduate to a juxta lite to her right leg. There lubricating skin READMISSION 08/12/2019 This is a 86 year old woman that we had in this clinic from May to July 2020. At that point she had wounds on her bilateral lower legs which were traumatic in the setting of edema and skin damage from chronic  venous insufficiency. The more difficult wound was an area on the left medial calf but we eventually got this to close over and put her in bilateral juxta lite stockings. They have been compliant with the stockings and everything was going well. Unfortunately the patient was admitted to hospital from 07/19/2019 through 07/24/2019 with Covid and UTI with sepsis physiology. During this time she did not have compression on her legs and edema was fairly bad when she finally returned home. About a week later they noted an open area on the left lateral calf. They have been using Neosporin and her juxta lite stocking but is not closing. This is been open for about 1-1/2 months. The patient's past medical history is reviewed its essentially unchanged. She has atrial fibrillation on Eliquis she is not a diabetic. Although her ABIs are noncompressible bilaterally we have not felt that she had a significant arterial issue sufficient to inhibit healing of these largely venous wounds 1/29; the patient is actually doing quite well. Wound dimensions are down significantly. Her edema control is good. We have been using silver alginate under 3 layer compression. 2/12; wound dimensions continue to contract. Her edema control is good we have been using silver alginate under 3 layer compression Objective Constitutional Patient is hypertensive.. Pulse regular and within target range for patient.Marland Kitchen Respirations regular, non-labored and within target range.. Temperature is normal and within the target range for the patient.Marland Kitchen Appears in no distress. Vitals Time Taken: 10:55 AM, Height: 63 in, Source: Stated, Weight: 101 lbs, Source: Stated, BMI: 17.9,  Temperature: 98.2 F, Pulse: 49 bpm, Respiratory Rate: 18 breaths/min, Blood Pressure: 164/60 mmHg. Cardiovascular Peripheral pulses are palpable. General Notes: Wound exam; left lateral mid calf. This wound is making nice progress. Under illumination no debridement is  necessary. Wound surface debrided with Anasept and gauze. No need for mechanical debridement. We have good edema control Integumentary (Hair, Skin) Wound #6 status is Open. Original cause of wound was Gradually Appeared. The wound is located on the Left,Lateral Lower Leg. The wound measures 0.9cm length x 0.8cm width x 0.1cm depth; 0.565cm^2 area and 0.057cm^3 volume. There is Fat Layer (Subcutaneous Tissue) Exposed exposed. There is no tunneling or undermining noted. There is a small amount of serosanguineous drainage noted. The wound margin is flat and intact. There is large (67-100%) red granulation within the wound bed. There is no necrotic tissue within the wound bed. Assessment Active Problems ICD-10 Chronic venous hypertension (idiopathic) with ulcer and inflammation of left lower extremity Non-pressure chronic ulcer of left calf with fat layer exposed Procedures Wound #6 Pre-procedure diagnosis of Wound #6 is a Venous Leg Ulcer located on the Left,Lateral Lower Leg . There was a Three Layer Compression Therapy Procedure by Deon Pilling, RN. Post procedure Diagnosis Wound #6: Same as Pre-Procedure Plan Follow-up Appointments: Return Appointment in 1 week. Dressing Change Frequency: Do not change entire dressing for one week. Skin Barriers/Peri-Wound Care: Moisturizing lotion Wound Cleansing: May shower with protection. - use cast protector Primary Wound Dressing: Wound #6 Left,Lateral Lower Leg: Calcium Alginate with Silver Secondary Dressing: Wound #6 Left,Lateral Lower Leg: Dry Gauze ABD pad Edema Control: 3 Layer Compression System - Left Lower Extremity Patient to wear own compression stockings - patient to wear juxtalite to right leg only for now. Apply in the morning and remove at night. Avoid standing for long periods of time Elevate legs to the level of the heart or above for 30 minutes daily and/or when sitting, a frequency of: - throughout the day. Exercise  regularly 1 silver alginate 23 layer compression Electronic Signature(s) Signed: 09/03/2019 5:45:04 PM By: Linton Ham MD Entered By: Linton Ham on 09/03/2019 12:57:31 -------------------------------------------------------------------------------- SuperBill Details Patient Name: Date of Service: [name] 09/03/2019 Medical Record [id number] Patient Account Number: [id number] Date of Birth/Sex: Treating RN: 03-20-1926 (86 y.o. [name] Primary Care Provider: Simona Huh Other Clinician: Referring Provider: Treating Provider/Extender:Mirella Gueye, [name], [name] in Treatment: 3 Diagnosis Coding ICD-10 Codes Code Description [id number] Chronic venous hypertension (idiopathic) with ulcer and inflammation of left lower extremity L97.222 Non-pressure chronic ulcer of left calf with fat layer exposed Facility Procedures Physician Procedures CPT4 Code Description: [id number] - WC PHYS LEVEL 3 - EST PT ICD-10 Diagnosis Description I87.332 Chronic venous hypertension (idiopathic) with ulcer and extremity L97.222 Non-pressure chronic ulcer of left calf with fat layer e Modifier: inflammation of xposed Quantity: 1 left lower Electronic Signature(s) Signed: 09/03/2019 5:45:04 PM By: Linton Ham MD Entered By: Linton Ham on 09/03/2019 12:57:53

## 2019-09-10 ENCOUNTER — Encounter (HOSPITAL_BASED_OUTPATIENT_CLINIC_OR_DEPARTMENT_OTHER): Payer: Medicare Other | Admitting: Internal Medicine

## 2019-09-10 ENCOUNTER — Other Ambulatory Visit: Payer: Self-pay

## 2019-09-10 DIAGNOSIS — I87332 Chronic venous hypertension (idiopathic) with ulcer and inflammation of left lower extremity: Secondary | ICD-10-CM | POA: Diagnosis not present

## 2019-09-13 NOTE — Progress Notes (Signed)
ANDERA, CRANMER ([id number]) Visit Report for 09/10/2019 HPI Details Patient Name: Date of Service: TANAYAH, SQUITIERI 09/10/2019 11:00 AM Medical Record [id number] Patient Account Number: [id number] Date of Birth/Sex: Treating RN: [birth year] (86 y.o. [name] Primary Care Provider: Simona Huh Other Clinician: Referring Provider: Treating Provider/Extender:Marisue Canion, [name], [name] in Treatment: 4 History of Present Illness HPI Description: 12/04/2018 ADMISSION This is a 86 year old woman who lives independently. She was recently hospitalized from 09/04/2018 through 09/08/2018 and then had an SNF stay at [facility] for a predominant fracture of her distal right humerus. She lives at home within 40 year old husband. Since her arrival home she is apparently had a series of trauma on various wheelchairs resulting in 3 wounds on the front of her right lower leg one on the posterior part of the right leg and one small area on the left anterior leg. They have been dressing this with a foam-based dressing. She apparently has a longstanding history of edema in her lower legs. I note from reviewing her records she has chronic kidney disease COPD A. fib on Eliquis but no mention of a cause of lower extremity edema Past medical history includes Alzheimer's disease, right humerus fracture as discussed, atrial fib, hypertension, history of B-cell lymphoma and MGUS, , COPD and chronic edema ABIs were noncompressible on both sides in our clinic 5/22-87 year old female was started in the wound care clinic last week for wounds on both lower legs. These were described as traumatic right anterior leg, right lateral leg, right distal lateral, right posterior and left leg wounds. With history of venous hypertension and edema in both legs. Arterial ABIs were noncompressible but pulses were palpable silver alginate was used with 3 layer compression 5/29; this patient has a  cluster of wounds on the right mid tibia area related to chronic venous insufficiency and lymphedema also a small area on the left anterior tibia area. We have her with silver alginate and 3 layer compression. The wounds generally look better 6/5; continued improvement on the left there is only a small area medially that is not completely epithelialized. The area on the right mid tibial area has 4 wounds including 3 anteriorly and one posteriorly. Most of these are smaller. We have been using silver alginate 6/12; all the wounds on the left leg are healed. The wounds on the right leg all look smaller and have healthy surfaces. We have been using HFB. She does not yet have a stocking for the left leg 6/19; the patient remains healed on the left leg. The wounds on the right leg are 4. The larger one medially to the tibia 1 over the tibia a small one just lateral to the tibia and a small one on the posterior right calf. Silver alginate. I will change her to [facility] 6/25; the patient remains healed on her left leg and she has a juxta lite stocking today. The wounds on the right leg are smaller and look healthy. We have been using Hydrofera Blue under compression 7/10; the patient has a juxta lite on her left leg. There is only one wound on the right anterior leg left and it looks healthy. Unfortunately home health did not wrap her leg high enough she has some significant edema above the wrap below the knee and some bruising on the posterior part of this but no additional open wound. 3 of the wounds on the right leg have healed 7/24; the patient has a juxta lite on her left  leg. She had a dry scaled area left of the area on the right anterior left lower leg. We have been using Hydrofera Blue under 3 layer compression 7/30; the area on the right medial lower leg is closed. We have been using Hydrofera Blue under 3 layer compression. She can graduate to a juxta lite to her right leg. There  lubricating skin READMISSION 08/12/2019 This is a 85 year old woman that we had in this clinic from May to July 2020. At that point she had wounds on her bilateral lower legs which were traumatic in the setting of edema and skin damage from chronic venous insufficiency. The more difficult wound was an area on the left medial calf but we eventually got this to close over and put her in bilateral juxta lite stockings. They have been compliant with the stockings and everything was going well. Unfortunately the patient was admitted to hospital from 07/19/2019 through 07/24/2019 with Covid and UTI with sepsis physiology. During this time she did not have compression on her legs and edema was fairly bad when she finally returned home. About a week later they noted an open area on the left lateral calf. They have been using Neosporin and her juxta lite stocking but is not closing. This is been open for about 1-1/2 months. The patient's past medical history is reviewed its essentially unchanged. She has atrial fibrillation on Eliquis she is not a diabetic. Although her ABIs are noncompressible bilaterally we have not felt that she had a significant arterial issue sufficient to inhibit healing of these largely venous wounds 1/29; the patient is actually doing quite well. Wound dimensions are down significantly. Her edema control is good. We have been using silver alginate under 3 layer compression. 2/12; wound dimensions continue to contract. Her edema control is good we have been using silver alginate under 3 layer compression 2/19; wound dimensions continue to contract. We have been using silver alginate under 3 layer compression with good results Electronic Signature(s) Signed: 09/10/2019 6:29:59 PM By: Linton Ham MD Entered By: Linton Ham on 09/10/2019 13:14:56 -------------------------------------------------------------------------------- Physical Exam Details Patient Name: Date of  Service: [name]. 09/10/2019 11:00 AM Medical Record [id number] Patient Account Number: [id number] Date of Birth/Sex: Treating RN: 1926/01/02 (86 y.o. [name] Primary Care Provider: Simona Huh Other Clinician: Referring Provider: Treating Provider/Extender:Janeah Kovacich, [name], [name] in Treatment: 4 Constitutional Patient is hypertensive.. Pulse regular and within target range for patient.Marland Kitchen Respirations regular, non-labored and within target range.. Temperature is normal and within the target range for the patient.Marland Kitchen Appears in no distress. Notes Wound exam; left lateral mid calf. Only 2 small open areas remain and they were difficult to see without illumination. Her edema control is good. Skin changes of chronic venous insufficiency Electronic Signature(s) Signed: 09/10/2019 6:29:59 PM By: Linton Ham MD Entered By: Linton Ham on 09/10/2019 13:17:47 -------------------------------------------------------------------------------- Physician Orders Details Patient Name: Date of Service: LUDIA, GARTLAND 09/10/2019 11:00 AM Medical Record [id number] Patient Account Number: [id number] Date of Birth/Sex: Treating RN: August 12, 1925 (86 y.o. [name] Primary Care Provider: Simona Huh Other Clinician: Referring Provider: Treating Provider/Extender:Jennifer Payes, [name], [name] in Treatment: 4 Verbal / Phone Orders: No Diagnosis Coding ICD-10 Coding Code Description [phone] Chronic venous hypertension (idiopathic) with ulcer and inflammation of left lower extremity L97.222 Non-pressure chronic ulcer of left calf with fat layer exposed Follow-up Appointments Return Appointment in 1 week. Dressing Change Frequency Do not change entire dressing for one week. Skin Barriers/Peri-Wound  Care Moisturizing lotion Wound Cleansing May shower with protection. - use cast protector Primary Wound  Dressing Wound #6 Left,Lateral Lower Leg Calcium Alginate with Silver Secondary Dressing Wound #6 Left,Lateral Lower Leg Dry Gauze ABD pad Edema Control 3 Layer Compression System - Left Lower Extremity Patient to wear own compression stockings - patient to wear juxtalite to right leg only for now. Apply in the morning and remove at night. Avoid standing for long periods of time Elevate legs to the level of the heart or above for 30 minutes daily and/or when sitting, a frequency of: - throughout the day. Exercise regularly Electronic Signature(s) Signed: 09/10/2019 6:29:59 PM By: Linton Ham MD Signed: 09/13/2019 1:40:10 PM By: Kela Millin Entered By: Kela Millin on 09/10/2019 12:29:36 -------------------------------------------------------------------------------- Problem List Details Patient Name: Date of Service: KYEISHA, JANOWICZ 09/10/2019 11:00 AM Medical Record [id number] Patient Account Number: [id number] Date of Birth/Sex: Treating RN: 1926/04/17 (86 y.o. [name] Primary Care Provider: Simona Huh Other Clinician: Referring Provider: Treating Provider/Extender:Donelda Mailhot, [name], [name] in Treatment: 4 Active Problems ICD-10 Evaluated Encounter Code Description Active Date Today Diagnosis I87.332 Chronic venous hypertension (idiopathic) with ulcer 08/12/2019 No Yes and inflammation of left lower extremity L97.222 Non-pressure chronic ulcer of left calf with fat layer 08/12/2019 No Yes exposed Inactive Problems Resolved Problems Electronic Signature(s) Signed: 09/10/2019 6:29:59 PM By: Linton Ham MD Entered By: Linton Ham on 09/10/2019 13:14:19 -------------------------------------------------------------------------------- Progress Note Details Patient Name: Date of Service: [name]. 09/10/2019 11:00 AM Medical Record [id number] Patient Account Number: [id number] Date of  Birth/Sex: Treating RN: 1926-07-10 (86 y.o. [name] Primary Care Provider: Simona Huh Other Clinician: Referring Provider: Treating Provider/Extender:Vernetta Dizdarevic, [name], [name] in Treatment: 4 Subjective History of Present Illness (HPI) 12/04/2018 ADMISSION This is a 86 year old woman who lives independently. She was recently hospitalized from 09/04/2018 through 09/08/2018 and then had an SNF stay at [facility] for a predominant fracture of her distal right humerus. She lives at home within 21 year old husband. Since her arrival home she is apparently had a series of trauma on various wheelchairs resulting in 3 wounds on the front of her right lower leg one on the posterior part of the right leg and one small area on the left anterior leg. They have been dressing this with a foam-based dressing. She apparently has a longstanding history of edema in her lower legs. I note from reviewing her records she has chronic kidney disease COPD A. fib on Eliquis but no mention of a cause of lower extremity edema Past medical history includes Alzheimer's disease, right humerus fracture as discussed, atrial fib, hypertension, history of B-cell lymphoma and MGUS, , COPD and chronic edema ABIs were noncompressible on both sides in our clinic 5/22-86 year old female was started in the wound care clinic last week for wounds on both lower legs. These were described as traumatic right anterior leg, right lateral leg, right distal lateral, right posterior and left leg wounds. With history of venous hypertension and edema in both legs. Arterial ABIs were noncompressible but pulses were palpable silver alginate was used with 3 layer compression 5/29; this patient has a cluster of wounds on the right mid tibia area related to chronic venous insufficiency and lymphedema also a small area on the left anterior tibia area. We have her with silver alginate and 3 layer compression. The  wounds generally look better 6/5; continued improvement on the left there is only a small area medially that is not completely  epithelialized. ooThe area on the right mid tibial area has 4 wounds including 3 anteriorly and one posteriorly. Most of these are smaller. We have been using silver alginate 6/12; all the wounds on the left leg are healed. The wounds on the right leg all look smaller and have healthy surfaces. We have been using HFB. She does not yet have a stocking for the left leg 6/19; the patient remains healed on the left leg. The wounds on the right leg are 4. The larger one medially to the tibia 1 over the tibia a small one just lateral to the tibia and a small one on the posterior right calf. Silver alginate. I will change her to [facility] 6/25; the patient remains healed on her left leg and she has a juxta lite stocking today. The wounds on the right leg are smaller and look healthy. We have been using Hydrofera Blue under compression 7/10; the patient has a juxta lite on her left leg. There is only one wound on the right anterior leg left and it looks healthy. Unfortunately home health did not wrap her leg high enough she has some significant edema above the wrap below the knee and some bruising on the posterior part of this but no additional open wound. 3 of the wounds on the right leg have healed 7/24; the patient has a juxta lite on her left leg. She had a dry scaled area left of the area on the right anterior left lower leg. We have been using Hydrofera Blue under 3 layer compression 7/30; the area on the right medial lower leg is closed. We have been using Hydrofera Blue under 3 layer compression. She can graduate to a juxta lite to her right leg. There lubricating skin READMISSION 08/12/2019 This is a 86 year old woman that we had in this clinic from May to July 2020. At that point she had wounds on her bilateral lower legs which were traumatic in the setting of  edema and skin damage from chronic venous insufficiency. The more difficult wound was an area on the left medial calf but we eventually got this to close over and put her in bilateral juxta lite stockings. They have been compliant with the stockings and everything was going well. Unfortunately the patient was admitted to hospital from 07/19/2019 through 07/24/2019 with Covid and UTI with sepsis physiology. During this time she did not have compression on her legs and edema was fairly bad when she finally returned home. About a week later they noted an open area on the left lateral calf. They have been using Neosporin and her juxta lite stocking but is not closing. This is been open for about 1-1/2 months. The patient's past medical history is reviewed its essentially unchanged. She has atrial fibrillation on Eliquis she is not a diabetic. Although her ABIs are noncompressible bilaterally we have not felt that she had a significant arterial issue sufficient to inhibit healing of these largely venous wounds 1/29; the patient is actually doing quite well. Wound dimensions are down significantly. Her edema control is good. We have been using silver alginate under 3 layer compression. 2/12; wound dimensions continue to contract. Her edema control is good we have been using silver alginate under 3 layer compression 2/19; wound dimensions continue to contract. We have been using silver alginate under 3 layer compression with good results Objective Constitutional Patient is hypertensive.. Pulse regular and within target range for patient.Marland Kitchen Respirations regular, non-labored and within target range.. Temperature is normal  and within the target range for the patient.Marland Kitchen Appears in no distress. Vitals Time Taken: 11:31 AM, Height: 63 in, Weight: 101 lbs, BMI: 17.9, Temperature: 98.2 F, Pulse: 53 bpm, Respiratory Rate: 18 breaths/min, Blood Pressure: 186/76 mmHg. General Notes: Wound exam; left lateral mid  calf. Only 2 small open areas remain and they were difficult to see without illumination. Her edema control is good. Skin changes of chronic venous insufficiency Integumentary (Hair, Skin) Wound #6 status is Open. Original cause of wound was Gradually Appeared. The wound is located on the Left,Lateral Lower Leg. The wound measures 0.1cm length x 0.1cm width x 0.1cm depth; 0.008cm^2 area and 0.001cm^3 volume. There is Fat Layer (Subcutaneous Tissue) Exposed exposed. There is no tunneling or undermining noted. There is a small amount of serosanguineous drainage noted. The wound margin is flat and intact. There is large (67-100%) red granulation within the wound bed. There is no necrotic tissue within the wound bed. Assessment Active Problems ICD-10 Chronic venous hypertension (idiopathic) with ulcer and inflammation of left lower extremity Non-pressure chronic ulcer of left calf with fat layer exposed Procedures Wound #6 Pre-procedure diagnosis of Wound #6 is a Venous Leg Ulcer located on the Left,Lateral Lower Leg . There was a Three Layer Compression Therapy Procedure by Deon Pilling, RN. Post procedure Diagnosis Wound #6: Same as Pre-Procedure Plan Follow-up Appointments: Return Appointment in 1 week. Dressing Change Frequency: Do not change entire dressing for one week. Skin Barriers/Peri-Wound Care: Moisturizing lotion Wound Cleansing: May shower with protection. - use cast protector Primary Wound Dressing: Wound #6 Left,Lateral Lower Leg: Calcium Alginate with Silver Secondary Dressing: Wound #6 Left,Lateral Lower Leg: Dry Gauze ABD pad Edema Control: 3 Layer Compression System - Left Lower Extremity Patient to wear own compression stockings - patient to wear juxtalite to right leg only for now. Apply in the morning and remove at night. Avoid standing for long periods of time Elevate legs to the level of the heart or above for 30 minutes daily and/or when sitting, a  frequency of: - throughout the day. Exercise regularly 1. I am continuing with silver alginate under 3 layer compression 2. The patient has juxta lite stockings and she has 1 on the right leg have asked her to bring this for the left leg next week with anticipation this will be healed 3. We will need to make sure that they are applying the juxta lite stocking correctly I will also need to review historically whether she was wearing this when this wound developed Electronic Signature(s) Signed: 09/10/2019 6:29:59 PM By: Linton Ham MD Entered By: Linton Ham on 09/10/2019 13:18:45 -------------------------------------------------------------------------------- SuperBill Details Patient Name: Date of Service: Ticas, Renleigh P. 09/10/2019 Medical Record [id number] Patient Account Number: [id number] Date of Birth/Sex: Treating RN: 12-17-1925 (86 y.o. [name] Primary Care Provider: Simona Huh Other Clinician: Referring Provider: Treating Provider/Extender:Ekta Dancer, [name], [name] in Treatment: 4 Diagnosis Coding ICD-10 Codes Code Description [phone] Chronic venous hypertension (idiopathic) with ulcer and inflammation of left lower extremity L97.222 Non-pressure chronic ulcer of left calf with fat layer exposed Facility Procedures Physician Procedures CPT4 Code Description: [id number] - WC PHYS LEVEL 3 - EST PT ICD-10 Diagnosis Description I87.332 Chronic venous hypertension (idiopathic) with ulcer and extremity L97.222 Non-pressure chronic ulcer of left calf with fat layer e Modifier: inflammation of xposed Quantity: 1 left lower Electronic Signature(s) Signed: 09/10/2019 6:29:59 PM By: Linton Ham MD Entered By: Linton Ham on 09/10/2019 13:19:02

## 2019-09-17 ENCOUNTER — Encounter (HOSPITAL_BASED_OUTPATIENT_CLINIC_OR_DEPARTMENT_OTHER): Payer: Medicare Other | Admitting: Internal Medicine

## 2019-09-17 ENCOUNTER — Other Ambulatory Visit: Payer: Self-pay

## 2019-09-17 DIAGNOSIS — I87332 Chronic venous hypertension (idiopathic) with ulcer and inflammation of left lower extremity: Secondary | ICD-10-CM | POA: Diagnosis not present

## 2019-09-17 NOTE — Progress Notes (Signed)
OPHELIA, SIPE ([id number]) Visit Report for 09/17/2019 HPI Details Patient Name: Date of Service: [name], [name] 09/17/2019 10:15 AM Medical Record [id number] Patient Account Number: [id number] Date of Birth/Sex: Treating RN: [birth year] (86 y.o. [name] Primary Care Provider: Simona Huh Other Clinician: Referring Provider: Treating Provider/Extender:Alven Alverio, [name], [name] in Treatment: 5 History of Present Illness HPI Description: 12/04/2018 ADMISSION This is a 86 year old woman who lives independently. She was recently hospitalized from 09/04/2018 through 09/08/2018 and then had an SNF stay at [facility] for a predominant fracture of her distal right humerus. She lives at home within 36 year old husband. Since her arrival home she is apparently had a series of trauma on various wheelchairs resulting in 3 wounds on the front of her right lower leg one on the posterior part of the right leg and one small area on the left anterior leg. They have been dressing this with a foam-based dressing. She apparently has a longstanding history of edema in her lower legs. I note from reviewing her records she has chronic kidney disease COPD A. fib on Eliquis but no mention of a cause of lower extremity edema Past medical history includes Alzheimer's disease, right humerus fracture as discussed, atrial fib, hypertension, history of B-cell lymphoma and MGUS, , COPD and chronic edema ABIs were noncompressible on both sides in our clinic 5/22-86 year old female was started in the wound care clinic last week for wounds on both lower legs. These were described as traumatic right anterior leg, right lateral leg, right distal lateral, right posterior and left leg wounds. With history of venous hypertension and edema in both legs. Arterial ABIs were noncompressible but pulses were palpable silver alginate was used with 3 layer compression 5/29; this patient has a  cluster of wounds on the right mid tibia area related to chronic venous insufficiency and lymphedema also a small area on the left anterior tibia area. We have her with silver alginate and 3 layer compression. The wounds generally look better 6/5; continued improvement on the left there is only a small area medially that is not completely epithelialized. The area on the right mid tibial area has 4 wounds including 3 anteriorly and one posteriorly. Most of these are smaller. We have been using silver alginate 6/12; all the wounds on the left leg are healed. The wounds on the right leg all look smaller and have healthy surfaces. We have been using HFB. She does not yet have a stocking for the left leg 6/19; the patient remains healed on the left leg. The wounds on the right leg are 4. The larger one medially to the tibia 1 over the tibia a small one just lateral to the tibia and a small one on the posterior right calf. Silver alginate. I will change her to [facility] 6/25; the patient remains healed on her left leg and she has a juxta lite stocking today. The wounds on the right leg are smaller and look healthy. We have been using Hydrofera Blue under compression 7/10; the patient has a juxta lite on her left leg. There is only one wound on the right anterior leg left and it looks healthy. Unfortunately home health did not wrap her leg high enough she has some significant edema above the wrap below the knee and some bruising on the posterior part of this but no additional open wound. 3 of the wounds on the right leg have healed 7/24; the patient has a juxta lite on her left  leg. She had a dry scaled area left of the area on the right anterior left lower leg. We have been using Hydrofera Blue under 3 layer compression 7/30; the area on the right medial lower leg is closed. We have been using Hydrofera Blue under 3 layer compression. She can graduate to a juxta lite to her right leg. There  lubricating skin READMISSION 08/12/2019 This is a 86 year old woman that we had in this clinic from May to July 2020. At that point she had wounds on her bilateral lower legs which were traumatic in the setting of edema and skin damage from chronic venous insufficiency. The more difficult wound was an area on the left medial calf but we eventually got this to close over and put her in bilateral juxta lite stockings. They have been compliant with the stockings and everything was going well. Unfortunately the patient was admitted to hospital from 07/19/2019 through 07/24/2019 with Covid and UTI with sepsis physiology. During this time she did not have compression on her legs and edema was fairly bad when she finally returned home. About a week later they noted an open area on the left lateral calf. They have been using Neosporin and her juxta lite stocking but is not closing. This is been open for about 1-1/2 months. The patient's past medical history is reviewed its essentially unchanged. She has atrial fibrillation on Eliquis she is not a diabetic. Although her ABIs are noncompressible bilaterally we have not felt that she had a significant arterial issue sufficient to inhibit healing of these largely venous wounds 1/29; the patient is actually doing quite well. Wound dimensions are down significantly. Her edema control is good. We have been using silver alginate under 3 layer compression. 2/12; wound dimensions continue to contract. Her edema control is good we have been using silver alginate under 3 layer compression 2/19; wound dimensions continue to contract. We have been using silver alginate under 3 layer compression with good results 2/26; the patient's wound on her leg is completely healed. She has severe venous hypertension. She has a juxta light on the right leg and she brought 1 to put on the left leg. Electronic Signature(s) Signed: 09/17/2019 5:26:28 PM By: Linton Ham MD Entered  By: Linton Ham on 09/17/2019 11:50:51 -------------------------------------------------------------------------------- Physical Exam Details Patient Name: Date of Service: [name], [name] 09/17/2019 10:15 AM Medical Record [id number] Patient Account Number: [id number] Date of Birth/Sex: Treating RN: [birth year] (86 y.o. [name] Primary Care Provider: Simona Huh Other Clinician: Referring Provider: Treating Provider/Extender:Barbie Croston, [name], [name] in Treatment: 5 Constitutional Patient is hypertensive.. Pulse regular and within target range for patient.Marland Kitchen Respirations regular, non-labored and within target range.. Temperature is normal and within the target range for the patient.Marland Kitchen Appears in no distress. Notes Wound exam; left lateral mid calf. This is closed. Her edema control is good. Severe skin changes of chronic venous insufficiency there is no major edema. Electronic Signature(s) Signed: 09/17/2019 5:26:28 PM By: Linton Ham MD Entered By: Linton Ham on 09/17/2019 11:51:42 -------------------------------------------------------------------------------- Physician Orders Details Patient Name: Date of Service: [name], [name] 09/17/2019 10:15 AM Medical Record [id number] Patient Account Number: [id number] Date of Birth/Sex: Treating RN: [birth year] (86 y.o. [name] Primary Care Provider: Simona Huh Other Clinician: Referring Provider: Treating Provider/Extender:Renessa Wellnitz, [name], [name] in Treatment: 5 Verbal / Phone Orders: No Diagnosis Coding ICD-10 Coding Code Description [id number] Chronic venous hypertension (idiopathic) with ulcer and inflammation of left lower extremity L97.222 Non-pressure  chronic ulcer of left calf with fat layer exposed Discharge From [facility] Discharge from [place] - call if wound re-opens Edema Control Other: - wear Bilateral  Juxtalites. Electronic Signature(s) Signed: 09/17/2019 5:17:01 PM By: Kela Millin Signed: 09/17/2019 5:26:28 PM By: Linton Ham MD Entered By: Kela Millin on 09/17/2019 11:22:22 -------------------------------------------------------------------------------- Problem List Details Patient Name: Date of Service: [name], [name] 09/17/2019 10:15 AM Medical Record [id number] Patient Account Number: [id number] Date of Birth/Sex: Treating RN: 1926/01/02 (86 y.o. [name] Primary Care Provider: Simona Huh Other Clinician: Referring Provider: Treating Provider/Extender:Kaiesha Tonner, [name], [name] in Treatment: 5 Active Problems ICD-10 Evaluated Encounter Code Description Active Date Today Diagnosis I87.332 Chronic venous hypertension (idiopathic) with ulcer 08/12/2019 No Yes and inflammation of left lower extremity L97.222 Non-pressure chronic ulcer of left calf with fat layer 08/12/2019 No Yes exposed Inactive Problems Resolved Problems Electronic Signature(s) Signed: 09/17/2019 5:26:28 PM By: Linton Ham MD Entered By: Linton Ham on 09/17/2019 11:50:04 -------------------------------------------------------------------------------- Progress Note Details Patient Name: Date of Service: [name], Mary P. 09/17/2019 10:15 AM Medical Record [id number] Patient Account Number: [id number] Date of Birth/Sex: Treating RN: [birth year] (86 y.o. [name] Primary Care Provider: Simona Huh Other Clinician: Referring Provider: Treating Provider/Extender:Aubrei Bouchie, [name], [name] in Treatment: 5 Subjective History of Present Illness (HPI) 12/04/2018 ADMISSION This is a 86 year old woman who lives independently. She was recently hospitalized from 09/04/2018 through 09/08/2018 and then had an SNF stay at [facility] for a predominant fracture of her distal right humerus. She lives at home within  51 year old husband. Since her arrival home she is apparently had a series of trauma on various wheelchairs resulting in 3 wounds on the front of her right lower leg one on the posterior part of the right leg and one small area on the left anterior leg. They have been dressing this with a foam-based dressing. She apparently has a longstanding history of edema in her lower legs. I note from reviewing her records she has chronic kidney disease COPD A. fib on Eliquis but no mention of a cause of lower extremity edema Past medical history includes Alzheimer's disease, right humerus fracture as discussed, atrial fib, hypertension, history of B-cell lymphoma and MGUS, , COPD and chronic edema ABIs were noncompressible on both sides in our clinic 5/22-86 year old female was started in the wound care clinic last week for wounds on both lower legs. These were described as traumatic right anterior leg, right lateral leg, right distal lateral, right posterior and left leg wounds. With history of venous hypertension and edema in both legs. Arterial ABIs were noncompressible but pulses were palpable silver alginate was used with 3 layer compression 5/29; this patient has a cluster of wounds on the right mid tibia area related to chronic venous insufficiency and lymphedema also a small area on the left anterior tibia area. We have her with silver alginate and 3 layer compression. The wounds generally look better 6/5; continued improvement on the left there is only a small area medially that is not completely epithelialized. ooThe area on the right mid tibial area has 4 wounds including 3 anteriorly and one posteriorly. Most of these are smaller. We have been using silver alginate 6/12; all the wounds on the left leg are healed. The wounds on the right leg all look smaller and have healthy surfaces. We have been using HFB. She does not yet have a stocking for the left leg 6/19; the patient remains healed on  the left leg. The wounds on the right leg are 4. The larger one medially to the tibia 1 over the tibia a small one just lateral to the tibia and a small one on the posterior right calf. Silver alginate. I will change her to [facility] 6/25; the patient remains healed on her left leg and she has a juxta lite stocking today. The wounds on the right leg are smaller and look healthy. We have been using Hydrofera Blue under compression 7/10; the patient has a juxta lite on her left leg. There is only one wound on the right anterior leg left and it looks healthy. Unfortunately home health did not wrap her leg high enough she has some significant edema above the wrap below the knee and some bruising on the posterior part of this but no additional open wound. 3 of the wounds on the right leg have healed 7/24; the patient has a juxta lite on her left leg. She had a dry scaled area left of the area on the right anterior left lower leg. We have been using Hydrofera Blue under 3 layer compression 7/30; the area on the right medial lower leg is closed. We have been using Hydrofera Blue under 3 layer compression. She can graduate to a juxta lite to her right leg. There lubricating skin READMISSION 08/12/2019 This is a 86 year old woman that we had in this clinic from May to July 2020. At that point she had wounds on her bilateral lower legs which were traumatic in the setting of edema and skin damage from chronic venous insufficiency. The more difficult wound was an area on the left medial calf but we eventually got this to close over and put her in bilateral juxta lite stockings. They have been compliant with the stockings and everything was going well. Unfortunately the patient was admitted to hospital from 07/19/2019 through 07/24/2019 with Covid and UTI with sepsis physiology. During this time she did not have compression on her legs and edema was fairly bad when she finally returned home. About a week  later they noted an open area on the left lateral calf. They have been using Neosporin and her juxta lite stocking but is not closing. This is been open for about 1-1/2 months. The patient's past medical history is reviewed its essentially unchanged. She has atrial fibrillation on Eliquis she is not a diabetic. Although her ABIs are noncompressible bilaterally we have not felt that she had a significant arterial issue sufficient to inhibit healing of these largely venous wounds 1/29; the patient is actually doing quite well. Wound dimensions are down significantly. Her edema control is good. We have been using silver alginate under 3 layer compression. 2/12; wound dimensions continue to contract. Her edema control is good we have been using silver alginate under 3 layer compression 2/19; wound dimensions continue to contract. We have been using silver alginate under 3 layer compression with good results 2/26; the patient's wound on her leg is completely healed. She has severe venous hypertension. She has a juxta light on the right leg and she brought 1 to put on the left leg. Objective Constitutional Patient is hypertensive.. Pulse regular and within target range for patient.Marland Kitchen Respirations regular, non-labored and within target range.. Temperature is normal and within the target range for the patient.Marland Kitchen Appears in no distress. Vitals Time Taken: 10:45 AM, Height: 63 in, Weight: 101 lbs, BMI: 17.9, Temperature: 98.4 F, Pulse: 50 bpm, Respiratory Rate: 18 breaths/min, Blood Pressure: 160/52 mmHg. General  Notes: Wound exam; left lateral mid calf. This is closed. Her edema control is good. Severe skin changes of chronic venous insufficiency there is no major edema. Integumentary (Hair, Skin) Wound #6 status is Healed - Epithelialized. Original cause of wound was Gradually Appeared. The wound is located on the Left,Lateral Lower Leg. The wound measures 0cm length x 0cm width x 0cm depth; 0cm^2  area and 0cm^3 volume. There is no tunneling or undermining noted. There is a none present amount of drainage noted. The wound margin is flat and intact. There is no granulation within the wound bed. There is no necrotic tissue within the wound bed. Assessment Active Problems ICD-10 Chronic venous hypertension (idiopathic) with ulcer and inflammation of left lower extremity Non-pressure chronic ulcer of left calf with fat layer exposed Plan Discharge From [facility]: Discharge from [place] - call if wound re-opens Edema Control: Other: - wear Bilateral Juxtalites. 1. The patient can be discharged to her bilateral juxta lites 2. Coached in skin lubrication they are already doing this Electronic Signature(s) Signed: 09/17/2019 5:26:28 PM By: Linton Ham MD Entered By: Linton Ham on 09/17/2019 11:52:14 -------------------------------------------------------------------------------- SuperBill Details Patient Name: Date of Service: [name], [name] 09/17/2019 Medical Record [id number] Patient Account Number: [id number] Date of Birth/Sex: Treating RN: 11/09/1925 (86 y.o. [name] Primary Care Provider: Simona Huh Other Clinician: Referring Provider: Treating Provider/Extender:Garion Wempe, [name], [name] in Treatment: 5 Diagnosis Coding ICD-10 Codes Code Description [id number] Chronic venous hypertension (idiopathic) with ulcer and inflammation of left lower extremity L97.222 Non-pressure chronic ulcer of left calf with fat layer exposed Facility Procedures The patient participates with Medicare or their insurance follows the Medicare Facility Guidelines: CPT4 Code Description Modifier Quantity [id number] [place] VISIT-LEV 3 EST PT 1 Physician Procedures CPT4 Code Description: [id number] - WC PHYS LEVEL 3 - EST PT ICD-10 Diagnosis Description I87.332 Chronic venous hypertension (idiopathic) with ulcer and extremity  L97.222 Non-pressure chronic ulcer of left calf with fat layer e Modifier: inflammation of xposed Quantity: 1 left lower Electronic Signature(s) Signed: 09/17/2019 5:26:28 PM By: Linton Ham MD Entered By: Linton Ham on 09/17/2019 11:52:39

## 2019-09-21 NOTE — Progress Notes (Signed)
[name], [name] ([id number]) Visit Report for 09/17/2019 Arrival Information Details Patient Name: Date of Service: [name], [name] 09/17/2019 10:15 AM Medical Record [id number] Patient Account Number: [id number] Date of Birth/Sex: Treating RN: 08-27-1925 (86 y.o. [name] Primary Care Lolah Coghlan: Simona Huh Other Clinician: Referring Valerye Kobus: Treating Shealyn Sean/Extender:Robson, [name], [name] in Treatment: 5 Visit Information History Since Last Visit All ordered tests and consults were completed: No Patient Arrived: Wheel Chair Added or deleted any medications: No Arrival Time: 10:32 Any new allergies or adverse reactions: No Accompanied By: caregiver Had a fall or experienced change in No Transfer Assistance: None activities of daily living that may affect Patient Identification Verified: Yes risk of falls: Secondary Verification Process Yes Signs or symptoms of abuse/neglect since last No Completed: visito Patient Requires Transmission- No Hospitalized since last visit: No Based Precautions: Implantable device outside of the clinic excluding No Patient Has Alerts: Yes Patient on Blood cellular tissue based products placed in the center Patient Alerts: since last visit: Thinner Bilateral legs: Sylvester Has Dressing in Place as Prescribed: Yes Has Compression in Place as Prescribed: Yes Pain Present Now: No Electronic Signature(s) Signed: 09/21/2019 9:14:02 AM By: Carlene Coria RN Entered By: Carlene Coria on 09/17/2019 10:45:23 -------------------------------------------------------------------------------- Clinic Level of Care Assessment Details Patient Name: Date of Service: [name], [name] 09/17/2019 10:15 AM Medical Record [id number] Patient Account Number: [id number] Date of Birth/Sex: Treating RN: February 18, 1926 (86 y.o. [name] Primary Care Jerimyah Vandunk: Simona Huh Other Clinician: Referring Aranda Bihm:  Treating Aidenn Skellenger/Extender:Robson, [name], [name] in Treatment: 5 Clinic Level of Care Assessment Items TOOL 4 Quantity Score X - Use when only an EandM is performed on FOLLOW-UP visit 1 0 ASSESSMENTS - Nursing Assessment / Reassessment X - Reassessment of Co-morbidities (includes updates in patient status) 1 10 X - Reassessment of Adherence to Treatment Plan 1 5 ASSESSMENTS - Wound and Skin Assessment / Reassessment X - Simple Wound Assessment / Reassessment - one wound 1 5 []  - Complex Wound Assessment / Reassessment - multiple wounds 0 []  - Dermatologic / Skin Assessment (not related to wound area) 0 ASSESSMENTS - Focused Assessment X - Circumferential Edema Measurements - multi extremities 1 5 []  - Nutritional Assessment / Counseling / Intervention 0 []  - Lower Extremity Assessment (monofilament, tuning fork, pulses) 0 []  - Peripheral Arterial Disease Assessment (using hand held doppler) 0 ASSESSMENTS - Ostomy and/or Continence Assessment and Care []  - Incontinence Assessment and Management 0 []  - Ostomy Care Assessment and Management (repouching, etc.) 0 PROCESS - Coordination of Care X - Simple Patient / Family Education for ongoing care 1 15 []  - Complex (extensive) Patient / Family Education for ongoing care 0 X - Staff obtains Programmer, systems, Records, Test Results / Process Orders 1 10 []  - Staff telephones HHA, Nursing Homes / Clarify orders / etc 0 []  - Routine Transfer to another Facility (non-emergent condition) 0 []  - Routine Hospital Admission (non-emergent condition) 0 []  - New Admissions / Biomedical engineer / Ordering NPWT, Apligraf, etc. 0 []  - Emergency Hospital Admission (emergent condition) 0 X - Simple Discharge Coordination 1 10 []  - Complex (extensive) Discharge Coordination 0 PROCESS - Special Needs []  - Pediatric / Minor Patient Management 0 []  - Isolation Patient Management 0 []  - Hearing / Language / Visual special needs 0 []  -  Assessment of Community assistance (transportation, D/C planning, etc.) 0 []  - Additional assistance / Altered mentation 0 []  - Support Surface(s) Assessment (bed, cushion, seat, etc.)  0 INTERVENTIONS - Wound Cleansing / Measurement X - Simple Wound Cleansing - one wound 1 5 []  - Complex Wound Cleansing - multiple wounds 0 X - Wound Imaging (photographs - any number of wounds) 1 5 []  - Wound Tracing (instead of photographs) 0 X - Simple Wound Measurement - one wound 1 5 []  - Complex Wound Measurement - multiple wounds 0 INTERVENTIONS - Wound Dressings []  - Small Wound Dressing one or multiple wounds 0 []  - Medium Wound Dressing one or multiple wounds 0 []  - Large Wound Dressing one or multiple wounds 0 []  - Application of Medications - topical 0 []  - Application of Medications - injection 0 INTERVENTIONS - Miscellaneous []  - External ear exam 0 []  - Specimen Collection (cultures, biopsies, blood, body fluids, etc.) 0 []  - Specimen(s) / Culture(s) sent or taken to Lab for analysis 0 []  - Patient Transfer (multiple staff / Civil Service fast streamer / Similar devices) 0 []  - Simple Staple / Suture removal (25 or less) 0 []  - Complex Staple / Suture removal (26 or more) 0 []  - Hypo / Hyperglycemic Management (close monitor of Blood Glucose) 0 []  - Ankle / Brachial Index (ABI) - do not check if billed separately 0 X - Vital Signs 1 5 Has the patient been seen at the hospital within the last three years: Yes Total Score: 80 Level Of Care: New/Established - Level 3 Electronic Signature(s) Signed: 09/17/2019 5:17:01 PM By: Kela Millin Entered By: Kela Millin on 09/17/2019 11:28:52 -------------------------------------------------------------------------------- Lower Extremity Assessment Details Patient Name: Date of Service: [name], [name] 09/17/2019 10:15 AM Medical Record [id number] Patient Account Number: [id number] Date of Birth/Sex: Treating RN: [birth year] (86 y.o. [name] Primary Care Denaisha Swango: Simona Huh Other Clinician: Referring Ariah Mower: Treating Ahmaya Ostermiller/Extender:Robson, [name], [name] in Treatment: 5 Edema Assessment Assessed: [Left: No] [Right: No] Edema: [Left: N] [Right: o] Calf Left: Right: Point of Measurement: 37 cm From Medial Instep 23.9 cm cm Ankle Left: Right: Point of Measurement: 15 cm From Medial Instep 18.1 cm cm Electronic Signature(s) Signed: 09/21/2019 9:14:02 AM By: Carlene Coria RN Entered By: Carlene Coria on 09/17/2019 10:45:56 -------------------------------------------------------------------------------- Multi Wound Chart Details Patient Name: Date of Service: [name], Mary P. 09/17/2019 10:15 AM Medical Record [id number] Patient Account Number: [id number] Date of Birth/Sex: Treating RN: 06/24/1926 (86 y.o. [name] Primary Care Flavio Lindroth: Simona Huh Other Clinician: Referring Linsie Lupo: Treating Derenda Giddings/Extender:Robson, [name], [name] in Treatment: 5 Vital Signs Height(in): 63 Pulse(bpm): 50 Weight(lbs): 101 Blood Pressure(mmHg): 160/52 Body Mass Index(BMI): 18 Temperature(F): 98.4 Respiratory 18 Rate(breaths/min): Photos: [6:No Photos] [N/A:N/A] Wound Location: [6:Left Lower Leg - Lateral] [N/A:N/A] Wounding Event: [6:Gradually Appeared] [N/A:N/A] Primary Etiology: [6:Venous Leg Ulcer] [N/A:N/A] Comorbid History: [6:Cataracts, Middle ear problems, Anemia, Arrhythmia, Deep Vein Thrombosis, Hypertension, Colitis, End Stage Renal Disease, Rheumatoid Arthritis, Osteoarthritis, Received Chemotherapy] [N/A:N/A] Date Acquired: [6:06/22/2019] [N/A:N/A] Weeks of Treatment: [6:5] [N/A:N/A] Wound Status: [6:Healed - Epithelialized] [N/A:N/A] Measurements L x W x D 0x0x0 [N/A:N/A] (cm) Area (cm) : [6:0] [N/A:N/A] Volume (cm) : [6:0] [N/A:N/A] % Reduction in Area: [6:100.00%] [N/A:N/A] % Reduction in Volume: 100.00% [N/A:N/A] Classification:  [6:Full Thickness Without Exposed Support Structures] [N/A:N/A] Exudate Amount: [6:None Present] [N/A:N/A] Wound Margin: [6:Flat and Intact] [N/A:N/A] Granulation Amount: [6:None Present (0%)] [N/A:N/A] Necrotic Amount: [6:None Present (0%)] [N/A:N/A] Exposed Structures: [6:Fascia: No Fat Layer (Subcutaneous Tissue) Exposed: No Tendon: No Muscle: No Joint: No Bone: No Large (67-100%)] [N/A:N/A N/A] Treatment Notes Electronic Signature(s) Signed: 09/17/2019 5:17:01 PM By: Kela Millin Signed:  09/17/2019 5:26:28 PM By: Linton Ham MD Entered By: Linton Ham on 09/17/2019 11:50:21 -------------------------------------------------------------------------------- Multi-Disciplinary Care Plan Details Patient Name: Date of Service: [name], [name] 09/17/2019 10:15 AM Medical Record [id number] Patient Account Number: [id number] Date of Birth/Sex: Treating RN: 09/11/1925 (87 y.o. [name] Primary Care Robertlee Rogacki: Simona Huh Other Clinician: Referring Aairah Negrette: Treating Vasco Chong/Extender:Robson, [name], [name] in Treatment: 5 Active Inactive Electronic Signature(s) Signed: 09/17/2019 5:17:01 PM By: Kela Millin Entered By: Kela Millin on 09/17/2019 11:20:48 -------------------------------------------------------------------------------- Pain Assessment Details Patient Name: Date of Service: [name], [name] 09/17/2019 10:15 AM Medical Record [id number] Patient Account Number: [id number] Date of Birth/Sex: Treating RN: [birth year] (86 y.o. [name] Primary Care Haruto Demaria: Simona Huh Other Clinician: Referring Cali Cuartas: Treating Asim Gersten/Extender:Robson, [name], [name] in Treatment: 5 Active Problems Location of Pain Severity and Description of Pain Patient Has Paino No Site Locations Pain Management and Medication Current Pain Management: Electronic Signature(s) Signed: 09/21/2019  9:14:02 AM By: Carlene Coria RN Entered By: Carlene Coria on 09/17/2019 10:45:51 -------------------------------------------------------------------------------- Wound Assessment Details Patient Name: Date of Service: [name], [name] 09/17/2019 10:15 AM Medical Record [id number] Patient Account Number: [id number] Date of Birth/Sex: Treating RN: Jun 26, 1926 (86 y.o. [name] Primary Care Chari Parmenter: Simona Huh Other Clinician: Referring Brinsley Wence: Treating Shahana Capes/Extender:Robson, [name], [name] in Treatment: 5 Wound Status Wound Number: 6 Primary Venous Leg Ulcer Etiology: Wound Location: Left Lower Leg - Lateral Wound Healed - Epithelialized Wounding Event: Gradually Appeared Status: Date Acquired: 06/22/2019 Comorbid Cataracts, Middle ear problems, Anemia, Weeks Of Treatment: 5 History: Arrhythmia, Deep Vein Thrombosis, Clustered Wound: No Hypertension, Colitis, End Stage Renal Disease, Rheumatoid Arthritis, Osteoarthritis, Received Chemotherapy Wound Measurements Length: (cm) 0 % Reduct Width: (cm) 0 % Reduct Depth: (cm) 0 Epitheli Area: (cm) 0 Tunneli Volume: (cm) 0 Undermi Wound Description Full Thickness Without Exposed Support Foul Odo Classification: Structures Slough/F Wound Flat and Intact Margin: Exudate None Present Amount: Wound Bed Granulation Amount: None Present (0%) Necrotic Amount: None Present (0%) Fascia E Fat Laye Tendon E Muscle E Joint Ex Bone Exp r After Cleansing: No ibrino No Exposed Structure xposed: No r (Subcutaneous Tissue) Exposed: No xposed: No xposed: No posed: No osed: No ion in Area: 100% ion in Volume: 100% alization: Large (67-100%) ng: No ning: No Electronic Signature(s) Signed: 09/17/2019 5:17:01 PM By: Kela Millin Entered By: Kela Millin on 09/17/2019 11:21:09 -------------------------------------------------------------------------------- Vitals  Details Patient Name: Date of Service: [name], Mary P. 09/17/2019 10:15 AM Medical Record [id number] Patient Account Number: [id number] Date of Birth/Sex: Treating RN: [birth year] (86 y.o. [name] Primary Care Jonerik Sliker: Simona Huh Other Clinician: Referring Anays Detore: Treating Jamon Hayhurst/Extender:Robson, [name], [name] in Treatment: 5 Vital Signs Time Taken: 10:45 Temperature (F): 98.4 Height (in): 63 Pulse (bpm): 50 Weight (lbs): 101 Respiratory Rate (breaths/min): 18 Body Mass Index (BMI): 17.9 Blood Pressure (mmHg): 160/52 Reference Range: 80 - 120 mg / dl Electronic Signature(s) Signed: 09/21/2019 9:14:02 AM By: Carlene Coria RN Entered By: Carlene Coria on 09/17/2019 10:45:45

## 2019-11-03 NOTE — Progress Notes (Signed)
[name] ([id number]) Visit Report for 09/10/2019 Arrival Information Details Patient Name: Date of Service: [name], [name] 09/10/2019 11:00 AM Medical Record [id number] Patient Account Number: [id number] Date of Birth/Sex: Treating RN: 02/26/1926 (86 y.o. [name] Primary Care Venus Ruhe: Simona Huh Other Clinician: Referring Alyas Creary: Treating Alizee Maple/Extender:Robson, [name], [name] in Treatment: 4 Visit Information History Since Last Visit Added or deleted any medications: No Patient Arrived: [name] Any new allergies or adverse reactions: No Arrival Time: 11:30 Had a fall or experienced change in No Accompanied By: caregiver activities of daily living that may affect Transfer Assistance: None risk of falls: Patient Identification Verified: Yes Signs or symptoms of abuse/neglect since last No Secondary Verification Process Yes visito Completed: Hospitalized since last visit: No Patient Requires Transmission- No Implantable device outside of the clinic excluding No Based Precautions: cellular tissue based products placed in the center Patient Has Alerts: Yes Patient on Blood since last visit: Patient Alerts: Has Dressing in Place as Prescribed: Yes Thinner Bilateral legs: [place] Pain Present Now: No Electronic Signature(s) Signed: 11/03/2019 9:23:26 AM By: Sandre Kitty Entered By: Sandre Kitty on 09/10/2019 11:31:15 -------------------------------------------------------------------------------- Compression Therapy Details Patient Name: Date of Service: [name], [name] 09/10/2019 11:00 AM Medical Record [id number] Patient Account Number: [id number] Date of Birth/Sex: Treating RN: 1926-05-15 (86 y.o. [name] Primary Care Katye Valek: Simona Huh Other Clinician: Referring Jennings Stirling: Treating Tyla Burgner/Extender:Robson, [name], [name] in Treatment: 4 Compression Therapy  Performed for Wound Wound #6 Left,Lateral Lower Leg Assessment: Performed By: Clinician Deon Pilling, RN Compression Type: Three Layer Post Procedure Diagnosis Same as Pre-procedure Electronic Signature(s) Signed: 09/13/2019 1:40:10 PM By: Kela Millin Entered By: Kela Millin on 09/10/2019 12:30:56 -------------------------------------------------------------------------------- Encounter Discharge Information Details Patient Name: Date of Service: [name], [name] 09/10/2019 11:00 AM Medical Record [id number] Patient Account Number: [id number] Date of Birth/Sex: Treating RN: 05/07/1926 (86 y.o. [name] Primary Care Mathea Frieling: Simona Huh Other Clinician: Referring Zemira Zehring: Treating Shatarra Wehling/Extender:Robson, [name], [name] in Treatment: 4 Encounter Discharge Information Items Discharge Condition: Stable Ambulatory Status: Walker Discharge Destination: Home Transportation: Private Auto Accompanied By: caregiver Schedule Follow-up Appointment: Yes Clinical Summary of Care: Electronic Signature(s) Signed: 09/16/2019 8:56:01 AM By: Levan Hurst RN, BSN Entered By: Levan Hurst on 09/10/2019 17:46:49 -------------------------------------------------------------------------------- Lower Extremity Assessment Details Patient Name: Date of Service: [name], [name] 09/10/2019 11:00 AM Medical Record [id number] Patient Account Number: [id number] Date of Birth/Sex: Treating RN: 1926-06-16 (86 y.o. [name] Primary Care Rosaland Shiffman: Simona Huh Other Clinician: Referring Myers Tutterow: Treating Zaynah Chawla/Extender:Robson, [name], [name] in Treatment: 4 Edema Assessment Assessed: [Left: No] [Right: No] Edema: [Left: N] [Right: o] Calf Left: Right: Point of Measurement: 37 cm From Medial Instep 23.9 cm cm Ankle Left: Right: Point of Measurement: 15 cm From Medial Instep 18.1 cm cm Vascular  Assessment Pulses: Dorsalis Pedis Palpable: [Left:Yes] Electronic Signature(s) Signed: 09/10/2019 6:14:01 PM By: Baruch Gouty RN, BSN Entered By: Baruch Gouty on 09/10/2019 11:42:45 -------------------------------------------------------------------------------- Multi Wound Chart Details Patient Name: Date of Service: Mary Favre. 09/10/2019 11:00 AM Medical Record [id number] Patient Account Number: [id number] Date of Birth/Sex: Treating RN: [birth year] (86 y.o. [name] Primary Care Mical Kicklighter: Simona Huh Other Clinician: Referring Zayleigh Stroh: Treating Avriana Joo/Extender:Robson, [name], [name] in Treatment: 4 Vital Signs Height(in): 63 Pulse(bpm): 53 Weight(lbs): 101 Blood Pressure(mmHg): 186/76 Body Mass Index(BMI): 18 Temperature(F): 98.2 Respiratory 18 Rate(breaths/min): Photos: [6:No Photos] [N/A:N/A] Wound Location: [6:Left Lower Leg - Lateral] [N/A:N/A] Wounding  Event: [6:Gradually Appeared] [N/A:N/A] Primary Etiology: [6:Venous Leg Ulcer] [N/A:N/A] Comorbid History: [6:Cataracts, Middle ear problems, Anemia, Arrhythmia, Deep Vein Thrombosis, Hypertension, Colitis, End Stage Renal Disease, Rheumatoid Arthritis, Osteoarthritis, Received Chemotherapy] [N/A:N/A] Date Acquired: [6:06/22/2019] [N/A:N/A] Weeks of Treatment: [6:4] [N/A:N/A] Wound Status: [6:Open] [N/A:N/A] Measurements L x W x D 0.1x0.1x0.1 [N/A:N/A] (cm) Area (cm) : [6:0.008] [N/A:N/A] Volume (cm) : [6:0.001] [N/A:N/A] % Reduction in Area: [6:99.80%] [N/A:N/A] % Reduction in Volume: [6:99.70%] [N/A:N/A] Classification: [6:Full Thickness Without Exposed Support Structures] [N/A:N/A] Exudate Amount: [6:Small] [N/A:N/A] Exudate Type: [6:Serosanguineous] [N/A:N/A] Exudate Color: [6:red, brown] [N/A:N/A] Wound Margin: [6:Flat and Intact] [N/A:N/A] Granulation Amount: [6:Large (67-100%)] [N/A:N/A] Granulation Quality: [6:Red] [N/A:N/A] Necrotic Amount:  [6:None Present (0%)] [N/A:N/A] Exposed Structures: [6:Fat Layer (Subcutaneous N/A Tissue) Exposed: Yes Fascia: No Tendon: No Muscle: No Joint: No Bone: No] Epithelialization: [6:Large (67-100%) Compression Therapy] [N/A:N/A N/A] Treatment Notes Electronic Signature(s) Signed: 09/10/2019 6:29:59 PM By: Robson, Michael MD Signed: 09/13/2019 1:40:10 PM By: Dwiggins, Shannon Entered By: Robson, Michael on 09/10/2019 13:14:27 -------------------------------------------------------------------------------- Multi-Disciplinary Care Plan Details Patient Name: Date of Service: [name], Mary P. 09/10/2019 11:00 AM Medical Record Number:[id number] Patient Account Number: [id number] Date of Birth/Sex: Treating RN: 01/11/1926 (86 y.o. F) Dwiggins, Shannon Primary Care : Ehinger, Robert R Other Clinician: Referring : Treating /Extender:Robson, Michael Ehinger, Robert R Weeks in Treatment: 4 Active Inactive Nutrition Nursing Diagnoses: Potential for alteratiion in Nutrition/Potential for imbalanced nutrition Goals: Patient/caregiver agrees to and verbalizes understanding of need to obtain nutritional consultation Date Initiated: 08/12/2019 Target Resolution Date: 10/01/2019 Goal Status: Active Interventions: Provide education on nutrition Treatment Activities: Education provided on Nutrition : 09/03/2019 Patient referred to Primary Care Physician for further nutritional evaluation : 08/12/2019 Notes: Pain, Acute or Chronic Nursing Diagnoses: Pain, acute or chronic: actual or potential Potential alteration in comfort, pain Goals: Patient will verbalize adequate pain control and receive pain control interventions during procedures as needed Date Initiated: 08/12/2019 Target Resolution Date: 10/01/2019 Goal Status: Active Patient/caregiver will verbalize comfort level met Date Initiated: 08/12/2019 Target Resolution Date: 10/01/2019 Goal Status:  Active Interventions: Encourage patient to take pain medications as prescribed Provide education on pain management Reposition patient for comfort Treatment Activities: Administer pain control measures as ordered : 08/12/2019 Notes: Wound/Skin Impairment Nursing Diagnoses: Knowledge deficit related to ulceration/compromised skin integrity Goals: Patient/caregiver will verbalize understanding of skin care regimen Date Initiated: 08/12/2019 Target Resolution Date: 10/01/2019 Goal Status: Active Ulcer/skin breakdown will heal within 14 weeks Date Initiated: 08/12/2019 Target Resolution Date: 10/29/2019 Goal Status: Active Interventions: Assess patient/caregiver ability to obtain necessary supplies Assess patient/caregiver ability to perform ulcer/skin care regimen upon admission and as needed Provide education on ulcer and skin care Treatment Activities: Skin care regimen initiated : 08/12/2019 Topical wound management initiated : 08/12/2019 Notes: Electronic Signature(s) Signed: 09/13/2019 1:40:10 PM By: Dwiggins, Shannon Entered By: Dwiggins, Shannon on 09/10/2019 12:43:50 -------------------------------------------------------------------------------- Pain Assessment Details Patient Name: Date of Service: [name], Mary P. 09/10/2019 11:00 AM Medical Record Number:[id number] Patient Account Number: [id number] Date of Birth/Sex: Treating RN: 05/04/1926 (86 y.o. F) Dwiggins, Shannon Primary Care : Ehinger, Robert R Other Clinician: Referring : Treating /Extender:Robson, Michael Ehinger, Robert R Weeks in Treatment: 4 Active Problems Location of Pain Severity and Description of Pain Patient Has Paino No Site Locations Pain Management and Medication Current Pain Management: Electronic Signature(s) Signed: 09/13/2019 1:40:10 PM By: Dwiggins, Shannon Signed: 11/03/2019 9:23:26 AM By: Dawkins, Destiny Entered By: Dawkins, Destiny on 09/10/2019  11:33:32 -------------------------------------------------------------------------------- Patient/Caregiver Education Details Patient Name: Date of Service: [name], Mary P. 2/19/2021andnbsp11:00 AM Medical Record   Patient Account Number: [id number] Number: Treating RN: Dwiggins, Shannon Date of Birth/Gender: 04/25/1926 (86 y.o. F) Other Clinician: Primary Care Physician: Ehinger, Robert R Primary Care Physician: Ehinger, Robert R Treating Robson, Michael Referring Physician: Physician/Extender: Ehinger, Robert R Weeks in Treatment: 4 Education Assessment Education Provided To: Patient Education Topics Provided Nutrition: Methods: Explain/Verbal Responses: State content correctly Pain: Methods: Explain/Verbal Responses: State content correctly Wound/Skin Impairment: Methods: Explain/Verbal Responses: State content correctly Electronic Signature(s) Signed: 09/13/2019 1:40:10 PM By: Dwiggins, Shannon Entered By: Dwiggins, Shannon on 09/10/2019 12:44:07 -------------------------------------------------------------------------------- Wound Assessment Details Patient Name: Date of Service: [name], Mary P. 09/10/2019 11:00 AM Medical Record Number:[id number] Patient Account Number: [id number] Date of Birth/Sex: Treating RN: 02/09/1926 (86 y.o. F) Dwiggins, Shannon Primary Care : Ehinger, Robert R Other Clinician: Referring : Treating /Extender:Robson, Michael Ehinger, Robert R Weeks in Treatment: 4 Wound Status Wound Number: 6 Primary Venous Leg Ulcer Etiology: Wound Location: Left Lower Leg - Lateral Wound Open Wounding Event: Gradually Appeared Status: Date Acquired: 06/22/2019 Comorbid Cataracts, Middle ear problems, Anemia, Weeks Of Treatment: 4 History: Arrhythmia, Deep Vein Thrombosis, Clustered Wound: No Hypertension, Colitis, End Stage Renal Disease, Rheumatoid Arthritis, Osteoarthritis, Received Chemotherapy Photos Wound  Measurements Length: (cm) 0.1 % Reduct Width: (cm) 0.1 % Reduct Depth: (cm) 0.1 Epitheli Area: (cm) 0.008 Tunneli Volume: (cm) 0.001 Undermi Wound Description Full Thickness Without Exposed Support Foul Od Classification: Structures Slough/ Wound Flat and Intact Margin: Exudate Small Amount: Exudate Serosanguineous Type: Exudate red, brown Color: Wound Bed Granulation Amount: Large (67-100%) Granulation Quality: Red Fascia E Necrotic Amount: None Present (0%) Fat Laye Tendon E Muscle E Joint Ex Bone Exp or After Cleansing: No Fibrino No Exposed Structure xposed: No r (Subcutaneous Tissue) Exposed: Yes xposed: No xposed: No posed: No osed: No ion in Area: 99.8% ion in Volume: 99.7% alization: Large (67-100%) ng: No ning: No Electronic Signature(s) Signed: 09/10/2019 5:06:31 PM By: Jones, Dedrick EMT/HBOT Signed: 09/13/2019 1:40:10 PM By: Dwiggins, Shannon Entered By: Jones, Dedrick on 09/10/2019 13:23:13 -------------------------------------------------------------------------------- Vitals Details Patient Name: Date of Service: [name], Mary P. 09/10/2019 11:00 AM Medical Record Number:[id number] Patient Account Number: [id number] Date of Birth/Sex: Treating RN: 06/30/1926 (86 y.o. F) Dwiggins, Shannon Primary Care : Ehinger, Robert R Other Clinician: Referring : Treating /Extender:Robson, Michael Ehinger, Robert R Weeks in Treatment: 4 Vital Signs Time Taken: 11:31 Temperature (°F): 98.2 Height (in): 63 Pulse (bpm): 53 Weight (lbs): 101 Respiratory Rate (breaths/min): 18 Body Mass Index (BMI): 17.9 Blood Pressure (mmHg): 186/76 Reference Range: 80 - 120 mg / dl Electronic Signature(s) Signed: 11/03/2019 9:23:26 AM By: Dawkins, Destiny Entered By: Dawkins, Destiny on 09/10/2019 11:33:27 

## 2019-11-05 ENCOUNTER — Other Ambulatory Visit: Payer: Self-pay

## 2019-11-05 ENCOUNTER — Encounter (HOSPITAL_BASED_OUTPATIENT_CLINIC_OR_DEPARTMENT_OTHER): Payer: Medicare Other | Attending: Internal Medicine | Admitting: Internal Medicine

## 2019-11-05 DIAGNOSIS — I4891 Unspecified atrial fibrillation: Secondary | ICD-10-CM | POA: Insufficient documentation

## 2019-11-05 DIAGNOSIS — N183 Chronic kidney disease, stage 3 unspecified: Secondary | ICD-10-CM | POA: Diagnosis not present

## 2019-11-05 DIAGNOSIS — I872 Venous insufficiency (chronic) (peripheral): Secondary | ICD-10-CM | POA: Insufficient documentation

## 2019-11-05 DIAGNOSIS — Z7901 Long term (current) use of anticoagulants: Secondary | ICD-10-CM | POA: Insufficient documentation

## 2019-11-05 DIAGNOSIS — Z86718 Personal history of other venous thrombosis and embolism: Secondary | ICD-10-CM | POA: Diagnosis not present

## 2019-11-05 DIAGNOSIS — L97521 Non-pressure chronic ulcer of other part of left foot limited to breakdown of skin: Secondary | ICD-10-CM | POA: Insufficient documentation

## 2019-11-05 DIAGNOSIS — S8012XD Contusion of left lower leg, subsequent encounter: Secondary | ICD-10-CM | POA: Insufficient documentation

## 2019-11-05 DIAGNOSIS — J449 Chronic obstructive pulmonary disease, unspecified: Secondary | ICD-10-CM | POA: Insufficient documentation

## 2019-11-05 NOTE — Progress Notes (Signed)
[name], [name] ([id number]) Visit Report for 11/05/2019 Abuse/Suicide Risk Screen Details Patient Name: Date of Service: [name], [name] 11/05/2019 10:30 AM Medical Record [id number] Patient Account Number: [id number] Date of Birth/Sex: Treating RN: [birth year] (86 y.o. [name] Primary Care Malini Flemings: Simona Huh Other Clinician: Referring Niralya Ohanian: Treating Senaida Chilcote/Extender:Robson, [name], [name] in Treatment: 0 Abuse/Suicide Risk Screen Items Answer ABUSE RISK SCREEN: Has anyone close to you tried to hurt or harm you recentlyo No Do you feel uncomfortable with anyone in your familyo No Has anyone forced you do things that you didnt want to doo No Electronic Signature(s) Signed: 11/05/2019 4:22:27 PM By: Levan Hurst RN, BSN Entered By: Levan Hurst on 11/05/2019 10:54:09 -------------------------------------------------------------------------------- Activities of Daily Living Details Patient Name: Date of Service: [name], [name] 11/05/2019 10:30 AM Medical Record [id number] Patient Account Number: [id number] Date of Birth/Sex: Treating RN: 1926/07/19 (86 y.o. [name] Primary Care Duante Arocho: Simona Huh Other Clinician: Referring Man Effertz: Treating Sarinity Dicicco/Extender:Robson, [name], [name] in Treatment: 0 Activities of Daily Living Items Answer Activities of Daily Living (Please select one for each item) Drive Automobile Not Able Take Medications Need Assistance Use Telephone Completely Able Care for Appearance Need Assistance Use Toilet Need Assistance Bath / Shower Completely Able Dress Self Need Assistance Feed Self Completely Able Walk Need Assistance Get In / Out Bed Need Assistance Housework Need Assistance Prepare Meals Need Assistance Handle Money Need Assistance Shop for Self Need Assistance Electronic Signature(s) Signed: 11/05/2019 4:22:27 PM By: Levan Hurst RN,  BSN Entered By: Levan Hurst on 11/05/2019 10:54:35 -------------------------------------------------------------------------------- Education Screening Details Patient Name: Date of Service: [name] 11/05/2019 10:30 AM Medical Record [id number] Patient Account Number: [id number] Date of Birth/Sex: Treating RN: 07-25-1925 (86 y.o. [name] Primary Care Olajuwon Fosdick: Simona Huh Other Clinician: Referring Shawan Tosh: Treating Dulcinea Kinser/Extender:Robson, [name], [name] in Treatment: 0 Primary Learner Assessed: Patient Learning Preferences/Education Level/Primary Language Learning Preference: Explanation, Demonstration, Printed Material Highest Education Level: College or Above Preferred Language: English Cognitive Barrier Language Barrier: No Translator Needed: No Memory Deficit: No Emotional Barrier: No Cultural/Religious Beliefs Affecting Medical Care: No Physical Barrier Impaired Vision: No Impaired Hearing: No Decreased Hand dexterity: No Knowledge/Comprehension Knowledge Level: High Comprehension Level: High Ability to understand written High instructions: Ability to understand verbal High instructions: Motivation Anxiety Level: Calm Cooperation: Cooperative Education Importance: Acknowledges Need Interest in Health Problems: Asks Questions Perception: Coherent Willingness to Engage in Self- High Management Activities: Readiness to Engage in Self- High Management Activities: Electronic Signature(s) Signed: 11/05/2019 4:22:27 PM By: Levan Hurst RN, BSN Entered By: Levan Hurst on 11/05/2019 10:55:03 -------------------------------------------------------------------------------- Fall Risk Assessment Details Patient Name: Date of Service: [name]. 11/05/2019 10:30 AM Medical Record [id number] Patient Account Number: [id number] Date of Birth/Sex: Treating RN: May 17, 1926 (86 y.o. [name] Primary Care Anapaola Kinsel: Simona Huh Other Clinician: Referring Lorece Keach: Treating Alishba Naples/Extender:Robson, [name], [name] in Treatment: 0 Fall Risk Assessment Items Have you had 2 or more falls in the last 12 monthso 0 No Have you had any fall that resulted in injury in the last 12 monthso 0 No FALLS RISK SCREEN History of falling - immediate or within 3 months 0 No Secondary diagnosis (Do you have 2 or more medical diagnoseso) 15 Yes Ambulatory aid None/bed rest/wheelchair/nurse 0 No Crutches/cane/walker 15 Yes Furniture 0 No Intravenous therapy Access/Saline/Heparin Lock 0 No Weak (short steps with or without shuffle, stooped but able to lift head 10 Yes  while walking, may seek support from furniture) Impaired (short steps with shuffle, may have difficulty arising from chair, 0 No head down, impaired balance) Mental Status Oriented to own ability 0 Yes Overestimates or forgets limitations 0 No Risk Level: Medium Risk Score: 40 Electronic Signature(s) Signed: 11/05/2019 4:22:27 PM By: Levan Hurst RN, BSN Entered By: Levan Hurst on 11/05/2019 10:55:23 -------------------------------------------------------------------------------- Foot Assessment Details Patient Name: Date of Service: [name]. 11/05/2019 10:30 AM Medical Record [id number] Patient Account Number: [id number] Date of Birth/Sex: Treating RN: 19-Aug-1925 (86 y.o. [name] Primary Care Joli Koob: Simona Huh Other Clinician: Referring Emilyann Banka: Treating Faria Casella/Extender:Robson, [name], [name] in Treatment: 0 Foot Assessment Items Site Locations + = Sensation present, - = Sensation absent, C = Callus, U = Ulcer R = Redness, W = Warmth, M = Maceration, PU = Pre-ulcerative lesion F = Fissure, S = Swelling, D = Dryness Assessment Right: Left: Other Deformity: No No Prior Foot Ulcer: No No Prior Amputation: No No Charcot Joint: No  No Ambulatory Status: Ambulatory With Help Assistance Device: Walker Gait: Administrator, arts) Signed: 11/05/2019 4:22:27 PM By: Levan Hurst RN, BSN Entered By: Levan Hurst on 11/05/2019 10:55:57 -------------------------------------------------------------------------------- Nutrition Risk Screening Details Patient Name: Date of Service: [name], [name] 11/05/2019 10:30 AM Medical Record [id number] Patient Account Number: [id number] Date of Birth/Sex: Treating RN: Nov 20, 1925 (86 y.o. [name] Primary Care [place]acl[place] Acklin: Simona Huh Other Clinician: Referring Neno Hohensee: Treating Kashten Gowin/Extender:Robson, [name], [name] in Treatment: 0 Height (in): 64 Weight (lbs): 125 Body Mass Index (BMI): 21.5 Nutrition Risk Screening Items Score Screening NUTRITION RISK SCREEN: I have an illness or condition that made me change the kind and/or 0 No amount of food I eat I eat fewer than two meals per day 0 No I eat few fruits and vegetables, or milk products 0 No I have three or more drinks of beer, liquor or wine almost every day 0 No I have tooth or mouth problems that make it hard for me to eat 0 No I don't always have enough money to buy the food I need 0 No I eat alone most of the time 0 No I take three or more different prescribed or over-the-counter drugs a day 1 Yes 0 No Without wanting to, I have lost or gained 10 pounds in the last six months I am not always physically able to shop, cook and/or feed myself 2 Yes Nutrition Protocols Good Risk Protocol Provide education on Moderate Risk Protocol 0 nutrition High Risk Proctocol Risk Level: Moderate Risk Score: 3 Electronic Signature(s) Signed: 11/05/2019 4:22:27 PM By: Levan Hurst RN, BSN Entered By: Levan Hurst on 11/05/2019 10:55:35

## 2019-11-06 NOTE — Progress Notes (Signed)
[name], [name] ([id number]) Visit Report for 11/05/2019 HPI Details Patient Name: Date of Service: [name], [name] 11/05/2019 10:30 AM Medical Record [id number] Patient Account Number: [id number] Date of Birth/Sex: Treating RN: 23-Oct-1925 (86 y.o. [name] Primary Care Provider: Simona Huh Other Clinician: Referring Provider: Treating Provider/Extender:Cordarro Spinnato, [name], [name] in Treatment: 0 History of Present Illness HPI Description: 12/04/2018 ADMISSION This is a 86 year old woman who lives independently. She was recently hospitalized from 09/04/2018 through 09/08/2018 and then had an SNF stay at [facility] for a predominant fracture of her distal right humerus. She lives at home within 84 year old husband. Since her arrival home she is apparently had a series of trauma on various wheelchairs resulting in 3 wounds on the front of her right lower leg one on the posterior part of the right leg and one small area on the left anterior leg. They have been dressing this with a foam-based dressing. She apparently has a longstanding history of edema in her lower legs. I note from reviewing her records she has chronic kidney disease COPD A. fib on Eliquis but no mention of a cause of lower extremity edema Past medical history includes Alzheimer's disease, right humerus fracture as discussed, atrial fib, hypertension, history of B-cell lymphoma and MGUS, , COPD and chronic edema ABIs were noncompressible on both sides in our clinic 5/22-86 year old female was started in the wound care clinic last week for wounds on both lower legs. These were described as traumatic right anterior leg, right lateral leg, right distal lateral, right posterior and left leg wounds. With history of venous hypertension and edema in both legs. Arterial ABIs were noncompressible but pulses were palpable silver alginate was used with 3 layer compression 5/29; this patient has a  cluster of wounds on the right mid tibia area related to chronic venous insufficiency and lymphedema also a small area on the left anterior tibia area. We have her with silver alginate and 3 layer compression. The wounds generally look better 6/5; continued improvement on the left there is only a small area medially that is not completely epithelialized. The area on the right mid tibial area has 4 wounds including 3 anteriorly and one posteriorly. Most of these are smaller. We have been using silver alginate 6/12; all the wounds on the left leg are healed. The wounds on the right leg all look smaller and have healthy surfaces. We have been using HFB. She does not yet have a stocking for the left leg 6/19; the patient remains healed on the left leg. The wounds on the right leg are 4. The larger one medially to the tibia 1 over the tibia a small one just lateral to the tibia and a small one on the posterior right calf. Silver alginate. I will change her to [facility] 6/25; the patient remains healed on her left leg and she has a juxta lite stocking today. The wounds on the right leg are smaller and look healthy. We have been using Hydrofera Blue under compression 7/10; the patient has a juxta lite on her left leg. There is only one wound on the right anterior leg left and it looks healthy. Unfortunately home health did not wrap her leg high enough she has some significant edema above the wrap below the knee and some bruising on the posterior part of this but no additional open wound. 3 of the wounds on the right leg have healed 7/24; the patient has a juxta lite on her left  leg. She had a dry scaled area left of the area on the right anterior left lower leg. We have been using Hydrofera Blue under 3 layer compression 7/30; the area on the right medial lower leg is closed. We have been using Hydrofera Blue under 3 layer compression. She can graduate to a juxta lite to her right leg. There  lubricating skin READMISSION 08/12/2019 This is a 86 year old woman that we had in this clinic from May to July 2020. At that point she had wounds on her bilateral lower legs which were traumatic in the setting of edema and skin damage from chronic venous insufficiency. The more difficult wound was an area on the left medial calf but we eventually got this to close over and put her in bilateral juxta lite stockings. They have been compliant with the stockings and everything was going well. Unfortunately the patient was admitted to hospital from 07/19/2019 through 07/24/2019 with Covid and UTI with sepsis physiology. During this time she did not have compression on her legs and edema was fairly bad when she finally returned home. About a week later they noted an open area on the left lateral calf. They have been using Neosporin and her juxta lite stocking but is not closing. This is been open for about 1-1/2 months. The patient's past medical history is reviewed its essentially unchanged. She has atrial fibrillation on Eliquis she is not a diabetic. Although her ABIs are noncompressible bilaterally we have not felt that she had a significant arterial issue sufficient to inhibit healing of these largely venous wounds 1/29; the patient is actually doing quite well. Wound dimensions are down significantly. Her edema control is good. We have been using silver alginate under 3 layer compression. 2/12; wound dimensions continue to contract. Her edema control is good we have been using silver alginate under 3 layer compression 2/19; wound dimensions continue to contract. We have been using silver alginate under 3 layer compression with good results 2/26; the patient's wound on her leg is completely healed. She has severe venous hypertension. She has a juxta light on the right leg and she brought 1 to put on the left leg. READMISSION 11/05/2019 This is a patient with severe bilateral lower extremity skin  fragility secondary to chronic venous insufficiency. Her caregiver it that gives the history the roughly 2 weeks after she was in the clinic last time they noticed a large bruise in the area where the wound is currently probably a small subdural hematoma as well. They did not note any trauma although the patient is on Eliquis and it probably would not take much to result in significant thermal injury. She has a small wound on the left lateral upper calf. They have been using Neosporin and a Band-Aid. She has bilateral juxta lite stockings although they may not be using these with enough tension. Otherwise her past medical history is unchanged Electronic Signature(s) Signed: 11/06/2019 6:55:41 AM By: Linton Ham MD Entered By: Linton Ham on 11/05/2019 11:17:31 -------------------------------------------------------------------------------- Chemical Cauterization Details Patient Name: Date of Service: [name], [name] 11/05/2019 10:30 AM Medical Record [id number] Patient Account Number: [id number] Date of Birth/Sex: Treating RN: Aug 05, 1925 (86 y.o. [name] Primary Care Provider: Simona Huh Other Clinician: Referring Provider: Treating Provider/Extender:Jakhi Dishman, [name], [name] in Treatment: 0 Procedure Performed for: Wound #7 Left,Proximal,Lateral Lower Leg Performed By: Physician Ricard Dillon., MD Post Procedure Diagnosis Same as Pre-procedure Electronic Signature(s) Signed: 11/06/2019 6:55:41 AM By: Linton Ham MD Entered By:  Linton Ham on 11/05/2019 11:11:33 -------------------------------------------------------------------------------- Physical Exam Details Patient Name: Date of Service: [name], [name] 11/05/2019 10:30 AM Medical Record [id number] Patient Account Number: [id number] Date of Birth/Sex: Treating RN: 01/26/1926 (86 y.o. [name] Primary Care Provider: Simona Huh Other  Clinician: Referring Provider: Treating Provider/Extender:Seneca Hoback, [name], [name] in Treatment: 0 Constitutional Patient is hypertensive.. Pulse regular and within target range for patient.Marland Kitchen Respirations regular, non-labored and within target range.. Temperature is normal and within the target range for the patient.Marland Kitchen Appears in no distress. Patient looks about the same. Cardiovascular On the left side her dorsalis pedis pulses palpable but not the posterior tibial. Popliteal and femoral pulses are palpable.. Edema is not out of control.. Integumentary (Hair, Skin) Very fragile skin bilaterally with hemosiderin deposition bilaterally.. Notes Wound exam; small circular wound which generally looks healthy. No debridement is required. The surface of the wound is very fragile. I cauterized this with silver nitrate. She had rims of epithelialization under illumination Electronic Signature(s) Signed: 11/06/2019 6:55:41 AM By: Linton Ham MD Entered By: Linton Ham on 11/05/2019 11:19:15 -------------------------------------------------------------------------------- Physician Orders Details Patient Name: Date of Service: [name], [name] 11/05/2019 10:30 AM Medical Record [id number] Patient Account Number: [id number] Date of Birth/Sex: Treating RN: [birth year] (86 y.o. [name] Primary Care Provider: Simona Huh Other Clinician: Referring Provider: Treating Provider/Extender:Seniah Lawrence, [name], [name] in Treatment: 0 Verbal / Phone Orders: No Diagnosis Coding Follow-up Appointments Return Appointment in 2 weeks. - MD visit Nurse Visit: - next week Dressing Change Frequency Do not change entire dressing for one week. Skin Barriers/Peri-Wound Care Moisturizing lotion Wound Cleansing May shower with protection. - cast protector Primary Wound Dressing Wound #7 Left,Proximal,Lateral Lower Leg Calcium Alginate with  Silver Secondary Dressing Dry Gauze Edema Control 3 Layer Compression System - Left Lower Extremity Avoid standing for long periods of time Elevate legs to the level of the heart or above for 30 minutes daily and/or when sitting, a frequency of: Exercise regularly Electronic Signature(s) Signed: 11/05/2019 4:21:34 PM By: Kela Millin Signed: 11/06/2019 6:55:41 AM By: Linton Ham MD Entered By: Kela Millin on 11/05/2019 11:05:16 -------------------------------------------------------------------------------- Problem List Details Patient Name: Date of Service: Mary Favre. 11/05/2019 10:30 AM Medical Record [id number] Patient Account Number: [id number] Date of Birth/Sex: Treating RN: June 15, 1926 (86 y.o. [name] Primary Care Provider: Simona Huh Other Clinician: Referring Provider: Treating Provider/Extender:Roald Lukacs, [name], [name] in Treatment: 0 Active Problems ICD-10 Evaluated Encounter Code Description Active Date Today Diagnosis S80.12XD Contusion of left lower leg, subsequent encounter 11/05/2019 No Yes I87.322 Chronic venous hypertension (idiopathic) with 11/05/2019 No Yes inflammation of left lower extremity L97.521 Non-pressure chronic ulcer of other part of left foot 11/05/2019 No Yes limited to breakdown of skin Inactive Problems Resolved Problems Electronic Signature(s) Signed: 11/05/2019 4:21:34 PM By: Kela Millin Signed: 11/06/2019 6:55:41 AM By: Linton Ham MD Entered By: Kela Millin on 11/05/2019 11:11:25 -------------------------------------------------------------------------------- Progress Note Details Patient Name: Date of Service: Mary Favre. 11/05/2019 10:30 AM Medical Record [id number] Patient Account Number: [id number] Date of Birth/Sex: Treating RN: 07-13-1926 (86 y.o. [name] Primary Care Provider: Simona Huh Other Clinician: Referring  Provider: Treating Provider/Extender:Court Gracia, [name], [name] in Treatment: 0 Subjective History of Present Illness (HPI) 12/04/2018 ADMISSION This is a 86 year old woman who lives independently. She was recently hospitalized from 09/04/2018 through 09/08/2018 and then had an SNF stay at [facility] for a predominant fracture of her distal right humerus. She  lives at home within 49 year old husband. Since her arrival home she is apparently had a series of trauma on various wheelchairs resulting in 3 wounds on the front of her right lower leg one on the posterior part of the right leg and one small area on the left anterior leg. They have been dressing this with a foam-based dressing. She apparently has a longstanding history of edema in her lower legs. I note from reviewing her records she has chronic kidney disease COPD A. fib on Eliquis but no mention of a cause of lower extremity edema Past medical history includes Alzheimer's disease, right humerus fracture as discussed, atrial fib, hypertension, history of B-cell lymphoma and MGUS, , COPD and chronic edema ABIs were noncompressible on both sides in our clinic 5/22-86 year old female was started in the wound care clinic last week for wounds on both lower legs. These were described as traumatic right anterior leg, right lateral leg, right distal lateral, right posterior and left leg wounds. With history of venous hypertension and edema in both legs. Arterial ABIs were noncompressible but pulses were palpable silver alginate was used with 3 layer compression 5/29; this patient has a cluster of wounds on the right mid tibia area related to chronic venous insufficiency and lymphedema also a small area on the left anterior tibia area. We have her with silver alginate and 3 layer compression. The wounds generally look better 6/5; continued improvement on the left there is only a small area medially that is not completely  epithelialized. ooThe area on the right mid tibial area has 4 wounds including 3 anteriorly and one posteriorly. Most of these are smaller. We have been using silver alginate 6/12; all the wounds on the left leg are healed. The wounds on the right leg all look smaller and have healthy surfaces. We have been using HFB. She does not yet have a stocking for the left leg 6/19; the patient remains healed on the left leg. The wounds on the right leg are 4. The larger one medially to the tibia 1 over the tibia a small one just lateral to the tibia and a small one on the posterior right calf. Silver alginate. I will change her to [facility] 6/25; the patient remains healed on her left leg and she has a juxta lite stocking today. The wounds on the right leg are smaller and look healthy. We have been using Hydrofera Blue under compression 7/10; the patient has a juxta lite on her left leg. There is only one wound on the right anterior leg left and it looks healthy. Unfortunately home health did not wrap her leg high enough she has some significant edema above the wrap below the knee and some bruising on the posterior part of this but no additional open wound. 3 of the wounds on the right leg have healed 7/24; the patient has a juxta lite on her left leg. She had a dry scaled area left of the area on the right anterior left lower leg. We have been using Hydrofera Blue under 3 layer compression 7/30; the area on the right medial lower leg is closed. We have been using Hydrofera Blue under 3 layer compression. She can graduate to a juxta lite to her right leg. There lubricating skin READMISSION 08/12/2019 This is a 86 year old woman that we had in this clinic from May to July 2020. At that point she had wounds on her bilateral lower legs which were traumatic in the setting of edema and skin  damage from chronic venous insufficiency. The more difficult wound was an area on the left medial calf but we  eventually got this to close over and put her in bilateral juxta lite stockings. They have been compliant with the stockings and everything was going well. Unfortunately the patient was admitted to hospital from 07/19/2019 through 07/24/2019 with Covid and UTI with sepsis physiology. During this time she did not have compression on her legs and edema was fairly bad when she finally returned home. About a week later they noted an open area on the left lateral calf. They have been using Neosporin and her juxta lite stocking but is not closing. This is been open for about 1-1/2 months. The patient's past medical history is reviewed its essentially unchanged. She has atrial fibrillation on Eliquis she is not a diabetic. Although her ABIs are noncompressible bilaterally we have not felt that she had a significant arterial issue sufficient to inhibit healing of these largely venous wounds 1/29; the patient is actually doing quite well. Wound dimensions are down significantly. Her edema control is good. We have been using silver alginate under 3 layer compression. 2/12; wound dimensions continue to contract. Her edema control is good we have been using silver alginate under 3 layer compression 2/19; wound dimensions continue to contract. We have been using silver alginate under 3 layer compression with good results 2/26; the patient's wound on her leg is completely healed. She has severe venous hypertension. She has a juxta light on the right leg and she brought 1 to put on the left leg. READMISSION 11/05/2019 This is a patient with severe bilateral lower extremity skin fragility secondary to chronic venous insufficiency. Her caregiver it that gives the history the roughly 2 weeks after she was in the clinic last time they noticed a large bruise in the area where the wound is currently probably a small subdural hematoma as well. They did not note any trauma although the patient is on Eliquis and it  probably would not take much to result in significant thermal injury. She has a small wound on the left lateral upper calf. They have been using Neosporin and a Band-Aid. She has bilateral juxta lite stockings although they may not be using these with enough tension. Otherwise her past medical history is unchanged Patient History Unable to Obtain Patient History due to Altered Mental Status. Information obtained from Patient. Allergies penicillin, Sulfa (Sulfonamide Antibiotics) (Severity: Moderate, Reaction: itching, nausea, vomiting), aspirin, Benadryl, amoxicillin, ibuprofen, latex (Reaction: hives), peanut (Reaction: itching, swelling), ascorbic acid, Benadryl (Reaction: N/V) Family History Cancer - Siblings, Heart Disease - Siblings, No family history of Diabetes, Hereditary Spherocytosis, Hypertension, Kidney Disease, Lung Disease, Seizures, Stroke, Thyroid Problems, Tuberculosis. Social History Never smoker, Marital Status - Married, Alcohol Use - Never, Drug Use - No History, Caffeine Use - Moderate. Medical History Eyes Patient has history of Cataracts Denies history of Glaucoma, Optic Neuritis Ear/Nose/Mouth/Throat Patient has history of Middle ear problems Denies history of Chronic sinus problems/congestion Hematologic/Lymphatic Patient has history of Anemia Denies history of Hemophilia, Human Immunodeficiency Virus, Lymphedema, Sickle Cell Disease Respiratory Patient has history of Chronic Obstructive Pulmonary Disease (COPD) Denies history of Aspiration, Asthma, Pneumothorax, Sleep Apnea, Tuberculosis Cardiovascular Patient has history of Arrhythmia, Deep Vein Thrombosis, Hypertension Denies history of Angina, Congestive Heart Failure, Coronary Artery Disease, Hypotension, Myocardial Infarction, Peripheral Arterial Disease, Peripheral Venous Disease, Phlebitis, Vasculitis Gastrointestinal Denies history of Cirrhosis , Colitis, Crohnoos, Hepatitis A, Hepatitis B,  Hepatitis C Endocrine Denies history of Type  I Diabetes, Type II Diabetes Genitourinary Denies history of End Stage Renal Disease Immunological Denies history of Lupus Erythematosus, Raynaudoos, Scleroderma Integumentary (Skin) Denies history of History of Burn Musculoskeletal Patient has history of Osteoarthritis Denies history of Rheumatoid Arthritis Neurologic Patient has history of Neuropathy Denies history of Dementia, Quadriplegia, Paraplegia, Seizure Disorder Oncologic Patient has history of Received Chemotherapy Denies history of Received Radiation Psychiatric Denies history of Anorexia/bulimia, Confinement Anxiety Medical And Surgical History Notes Gastrointestinal GERD Genitourinary CKD stage 3 Immunological Hx Lymphadenopathy Integumentary (Skin) previous wounds to bilateral lower legs Oncologic Hx B-cell Lymphoma Review of Systems (ROS) Constitutional Symptoms (General Health) Denies complaints or symptoms of Fatigue, Fever, Chills, Marked Weight Change. Respiratory Denies complaints or symptoms of Chronic or frequent coughs, Shortness of Breath. Endocrine Denies complaints or symptoms of Heat/cold intolerance. Integumentary (Skin) Complains or has symptoms of Wounds - wound on left lower leg. Psychiatric Denies complaints or symptoms of Claustrophobia, Suicidal. Objective Constitutional Patient is hypertensive.. Pulse regular and within target range for patient.Marland Kitchen Respirations regular, non-labored and within target range.. Temperature is normal and within the target range for the patient.Marland Kitchen Appears in no distress. Patient looks about the same. Vitals Time Taken: 10:43 AM, Height: 64 in, Source: Stated, Weight: 125 lbs, Source: Stated, BMI: 21.5, Temperature: 98.4 F, Pulse: 55 bpm, Respiratory Rate: 16 breaths/min, Blood Pressure: 155/80 mmHg. Cardiovascular On the left side her dorsalis pedis pulses palpable but not the posterior tibial. Popliteal and  femoral pulses are palpable.. Edema is not out of control.. General Notes: Wound exam; small circular wound which generally looks healthy. No debridement is required. The surface of the wound is very fragile. I cauterized this with silver nitrate. She had rims of epithelialization under illumination Integumentary (Hair, Skin) Very fragile skin bilaterally with hemosiderin deposition bilaterally.. Wound #7 status is Open. Original cause of wound was Blister. The wound is located on the Left,Proximal,Lateral Lower Leg. The wound measures 1cm length x 1cm width x 0.1cm depth; 0.785cm^2 area and 0.079cm^3 volume. There is Fat Layer (Subcutaneous Tissue) Exposed exposed. There is no tunneling or undermining noted. There is a medium amount of serosanguineous drainage noted. The wound margin is flat and intact. There is large (67-100%) red granulation within the wound bed. There is no necrotic tissue within the wound bed. Assessment Active Problems ICD-10 Contusion of left lower leg, subsequent encounter Chronic venous hypertension (idiopathic) with inflammation of left lower extremity Non-pressure chronic ulcer of other part of left foot limited to breakdown of skin Procedures Wound #7 Pre-procedure diagnosis of Wound #7 is a Venous Leg Ulcer located on the Left,Proximal,Lateral Lower Leg . There was a Three Layer Compression Therapy Procedure by Kela Millin, RN. Post procedure Diagnosis Wound #7: Same as Pre-Procedure Pre-procedure diagnosis of Wound #7 is a Venous Leg Ulcer located on the Left,Proximal,Lateral Lower Leg . An Chemical Cauterization procedure was performed by Ricard Dillon., MD. Post procedure Diagnosis Wound #7: Same as Pre-Procedure Plan Follow-up Appointments: Return Appointment in 2 weeks. - MD visit Nurse Visit: - next week Dressing Change Frequency: Do not change entire dressing for one week. Skin Barriers/Peri-Wound Care: Moisturizing lotion Wound  Cleansing: May shower with protection. - cast protector Primary Wound Dressing: Wound #7 Left,Proximal,Lateral Lower Leg: Calcium Alginate with Silver Secondary Dressing: Dry Gauze Edema Control: 3 Layer Compression System - Left Lower Extremity Avoid standing for long periods of time Elevate legs to the level of the heart or above for 30 minutes daily and/or when sitting, a frequency of: Exercise  regularly 1. We are going to use silver alginate as the primary dressing with dry gauze and a 3 layer compression which she tolerated well when she was last in the clinic 2. They have a juxta lite stockings they may need to use more compression on with these then they are currently using looking at the right leg 3. She is on Eliquis and I think this started as a small hematoma probably traumatic although there was no such history available with Eliquis this could be trivial trauma I spent 30 minutes on review of this patient's past medical history, face-to-face evaluation and creation of this record Electronic Signature(s) Signed: 11/06/2019 6:55:41 AM By: Linton Ham MD Entered By: Linton Ham on 11/05/2019 11:20:33 -------------------------------------------------------------------------------- HxROS Details Patient Name: Date of Service: Mary Favre. 11/05/2019 10:30 AM Medical Record [id number] Patient Account Number: [id number] Date of Birth/Sex: Treating RN: 04/07/1926 (86 y.o. [name] Primary Care Provider: Simona Huh Other Clinician: Referring Provider: Treating Provider/Extender:Casey Fye, [name], [name] in Treatment: 0 Unable to Obtain Patient History due to Altered Mental Status Information Obtained From Patient Constitutional Symptoms (General Health) Complaints and Symptoms: Negative for: Fatigue; Fever; Chills; Marked Weight Change Respiratory Complaints and Symptoms: Negative for: Chronic or frequent coughs; Shortness  of Breath Medical History: Positive for: Chronic Obstructive Pulmonary Disease (COPD) Negative for: Aspiration; Asthma; Pneumothorax; Sleep Apnea; Tuberculosis Endocrine Complaints and Symptoms: Negative for: Heat/cold intolerance Medical History: Negative for: Type I Diabetes; Type II Diabetes Integumentary (Skin) Complaints and Symptoms: Positive for: Wounds - wound on left lower leg Medical History: Negative for: History of Burn Past Medical History Notes: previous wounds to bilateral lower legs Psychiatric Complaints and Symptoms: Negative for: Claustrophobia; Suicidal Medical History: Negative for: Anorexia/bulimia; Confinement Anxiety Eyes Medical History: Positive for: Cataracts Negative for: Glaucoma; Optic Neuritis Ear/Nose/Mouth/Throat Medical History: Positive for: Middle ear problems Negative for: Chronic sinus problems/congestion Hematologic/Lymphatic Medical History: Positive for: Anemia Negative for: Hemophilia; Human Immunodeficiency Virus; Lymphedema; Sickle Cell Disease Cardiovascular Medical History: Positive for: Arrhythmia; Deep Vein Thrombosis; Hypertension Negative for: Angina; Congestive Heart Failure; Coronary Artery Disease; Hypotension; Myocardial Infarction; Peripheral Arterial Disease; Peripheral Venous Disease; Phlebitis; Vasculitis Gastrointestinal Medical History: Negative for: Cirrhosis ; Colitis; Crohns; Hepatitis A; Hepatitis B; Hepatitis C Past Medical History Notes: GERD Genitourinary Medical History: Negative for: End Stage Renal Disease Past Medical History Notes: CKD stage 3 Immunological Medical History: Negative for: Lupus Erythematosus; Raynauds; Scleroderma Past Medical History Notes: Hx Lymphadenopathy Musculoskeletal Medical History: Positive for: Osteoarthritis Negative for: Rheumatoid Arthritis Neurologic Medical History: Positive for: Neuropathy Negative for: Dementia; Quadriplegia; Paraplegia; Seizure  Disorder Oncologic Medical History: Positive for: Received Chemotherapy Negative for: Received Radiation Past Medical History Notes: Hx B-cell Lymphoma HBO Extended History Items Eyes: Ear/Nose/Mouth/Throat: Cataracts Middle ear problems Immunizations Pneumococcal Vaccine: Received Pneumococcal Vaccination: No Implantable Devices None Family and Social History Cancer: Yes - Siblings; Diabetes: No; Heart Disease: Yes - Siblings; Hereditary Spherocytosis: No; Hypertension: No; Kidney Disease: No; Lung Disease: No; Seizures: No; Stroke: No; Thyroid Problems: No; Tuberculosis: No; Never smoker; Marital Status - Married; Alcohol Use: Never; Drug Use: No History; Caffeine Use: Moderate; Financial Concerns: No; Food, Clothing or Shelter Needs: No; Support System Lacking: No; Transportation Concerns: No Electronic Signature(s) Signed: 11/05/2019 4:22:27 PM By: Levan Hurst RN, BSN Signed: 11/06/2019 6:55:41 AM By: Linton Ham MD Entered By: Levan Hurst on 11/05/2019 10:54:02 -------------------------------------------------------------------------------- SuperBill Details Patient Name: Date of Service: [name], [name] 11/05/2019 Medical Record [id number] Patient Account Number: [id number] Date of Birth/Sex: Treating  RN: June 07, 1926 (86 y.o. [name] Primary Care Provider: Simona Huh Other Clinician: Referring Provider: Treating Provider/Extender:Judah Carchi, [name], [name] in Treatment: 0 Diagnosis Coding ICD-10 Codes Code Description S80.12XD Contusion of left lower leg, subsequent encounter I87.322 Chronic venous hypertension (idiopathic) with inflammation of left lower extremity L97.521 Non-pressure chronic ulcer of other part of left foot limited to breakdown of skin Facility Procedures CPT4 Code Description: [id number] [place] VISIT-LEV 3 EST PT Modifier: 25 Quantity: 1 CPT4 Code Description: 82081388 17250 - CHEM CAUT  GRANULATION TISS ICD-10 Diagnosis Description L97.521 Non-pressure chronic ulcer of other part of left foot limite Modifier: d to breakdow Quantity: 1 n of skin Physician Procedures CPT4 Code Description: 7195974 71855 - WC PHYS LEVEL 3 - EST PT ICD-10 Diagnosis Description S80.12XD Contusion of left lower leg, subsequent encounter I87.322 Chronic venous hypertension (idiopathic) with inflammatio L97.521 Non-pressure chronic ulcer  of other part of left foot lim Modifier: 25 n of left lowe ited to breakd Quantity: 1 r extremity own of skin Electronic Signature(s) Signed: 11/06/2019 6:55:41 AM By: Linton Ham MD Entered By: Linton Ham on 11/05/2019 11:20:54

## 2019-11-09 NOTE — Progress Notes (Signed)
Primary Physician:  Gaynelle Arabian, MD   Patient ID: [name], female    DOB: [birth year], 86 y.o.   MRN: [id number]  Subjective:    Chief Complaint  Patient presents with  . Paroxysmal Atrial Fibrillation  . Hypertension  . Follow-up    6 month    HPI: [name]  is a 86 y.o. female  with History of B-cell lymphoma without detectable signs of recurrence, history of CKD stage 3, and pancytopenia, history of essential tremors, essential hypertension, GERD, orthostatic hypotension, venous insufficiency, paroxysmal atrial fibrillation, and degenerative joint disease. She is on amiodarone for management of her A. fib.   She denies any bleeding diathesis with anticoagulation. Her main complaint is arthritis and right shoulder pain. She has not had any falls. Walks with walker. Has 24 hour care at home. Her daughter is close by.   Past Medical History:  Diagnosis Date  . Allergy   . Cancer ([place])    follicular lymphoma  . Chronic atrial fibrillation ([place]) 11/18/2017  . Chronic kidney disease   . COPD (chronic obstructive pulmonary disease) ([place])   . Depression   . DJD (degenerative joint disease)   . Dysuria 09/25/2015  . Essential tremor   . GERD (gastroesophageal reflux disease)   . Heart murmur    had echo 20 yr ago-not sure where  . Hx of colonic polyp 10/02/05  . Hypertension   . MGUS (monoclonal gammopathy of unknown significance) 10/24/2015  . MVP (mitral valve prolapse)   . Orthostatic hypotension   . Osteoporosis   . Paroxysmal atrial fibrillation ([facility])   . Venous insufficiency     Past Surgical History:  Procedure Laterality Date  . APPENDECTOMY    . CATARACT EXTRACTION Bilateral   . COLONOSCOPY    . DILATION AND CURETTAGE OF UTERUS     several  . OVARIAN CYST REMOVAL    . SKIN BIOPSY Right 03/09/2019   FACE AND LEG   Social History   Tobacco Use  . Smoking status: Never Smoker  . Smokeless tobacco: Never Used  Substance Use Topics  .  Alcohol use: No    Alcohol/week: 0.0 standard drinks   Marital Status: Widowed   Review of Systems  Cardiovascular: Positive for leg swelling. Negative for chest pain and dyspnea on exertion.  Gastrointestinal: Negative for melena.  Neurological: Positive for dizziness, tremors and weakness.   Objective:  Blood pressure (!) 167/80, pulse (!) 52, temperature 97.6 F (36.4 C), temperature source Temporal, resp. rate 16, height 5\' 3"  (1.6 m), weight 103 lb (46.7 kg), SpO2 98 %. Body mass index is 18.25 kg/m.  Vitals with BMI 11/10/2019 07/24/2019 07/24/2019  Height 5\' 3"  - -  Weight 103 lbs - -  BMI 24.09 - -  Systolic 735 329 924  Diastolic 80 86 86  Pulse 52 78 -      Physical Exam  Constitutional: Vital signs are normal. She appears well-developed and well-nourished.  Cardiovascular: Normal rate, regular rhythm, normal heart sounds and intact distal pulses.  Extrasystoles are present.  Pulses:      Dorsalis pedis pulses are 2+ on the right side and 2+ on the left side.       Posterior tibial pulses are 2+ on the right side and 2+ on the left side.  Bilateral non-pitting edema  Pulmonary/Chest: Effort normal and breath sounds normal. No accessory muscle usage. No respiratory distress.  Abdominal: Soft. Bowel sounds are normal.  Skin: Skin is  intact.  Vitals reviewed.  Radiology: No results found.  Laboratory examination:   CMP Latest Ref Rng & Units 07/24/2019 07/22/2019 07/21/2019  Glucose 70 - 99 mg/dL 92 100(H) 109(H)  BUN 8 - 23 mg/dL 26(H) 39(H) 40(H)  Creatinine 0.44 - 1.00 mg/dL 0.83 0.91 1.00  Sodium 135 - 145 mmol/L 136 137 136  Potassium 3.5 - 5.1 mmol/L 3.9 4.1 3.2(L)  Chloride 98 - 111 mmol/L 103 104 99  CO2 22 - 32 mmol/L 26 24 24   Calcium 8.9 - 10.3 mg/dL 8.8(L) 8.3(L) 8.2(L)  Total Protein 6.5 - 8.1 g/dL 5.5(L) 5.9(L) 6.3(L)  Total Bilirubin 0.3 - 1.2 mg/dL 0.7 1.0 1.5(H)  Alkaline Phos 38 - 126 U/L 59 46 50  AST 15 - 41 U/L 31 24 19   ALT 0 - 44 U/L 23 17 12      CBC Latest Ref Rng & Units 07/24/2019 07/22/2019 07/21/2019  WBC 4.0 - 10.5 K/uL 3.4(L) 7.2 5.7  Hemoglobin 12.0 - 15.0 g/dL 9.2(L) 9.8(L) 10.1(L)  Hematocrit 36.0 - 46.0 % 28.9(L) 29.5(L) 30.7(L)  Platelets 150 - 400 K/uL 164 236 229   Lipid Panel     Component Value Date/Time   CHOL 169 01/30/2017 0718   TRIG 109 01/30/2017 0718   HDL 61 01/30/2017 0718   CHOLHDL 2.8 01/30/2017 0718   VLDL 22 01/30/2017 0718   LDLCALC 86 01/30/2017 0718   HEMOGLOBIN A1C No results found for: HGBA1C, MPG TSH Recent Labs    01/27/19 1347 07/19/19 1409  TSH 0.86 0.638   External labs:  TSH 0.638 07/19/2019  PRN Meds:. Medications Discontinued During This Encounter  Medication Reason  . Glycerin-Hypromellose-PEG 400 ([facility] DRY EYE RELIEF) 0.2-0.2-1 % SOLN Patient Preference   Current Meds  Medication Sig  . acetaminophen (TYLENOL) 500 MG tablet Take 1-2 tablets (500-1,000 mg total) by mouth every 6 (six) hours as needed for moderate pain.  Marland Kitchen amiodarone (PACERONE) 200 MG tablet TAKE 1/2 TABLET BY MOUTH ONCE DAILY (Patient taking differently: Take 100 mg by mouth daily. )  . apixaban (ELIQUIS) 2.5 MG TABS tablet Take 1 tablet (2.5 mg total) by mouth 2 (two) times daily.  Marland Kitchen donepezil (ARICEPT) 10 MG tablet Take 1 tablet (10 mg total) by mouth at bedtime.  . famotidine (PEPCID) 20 MG tablet Take 20 mg by mouth daily as needed for heartburn or indigestion.   . hydrochlorothiazide (HYDRODIURIL) 25 MG tablet Take 25 mg by mouth daily.  . hyoscyamine (LEVSIN SL) 0.125 MG SL tablet Place 0.375 mg under the tongue 2 (two) times daily.   Marland Kitchen loperamide (IMODIUM A-D) 2 MG tablet Take 2 mg by mouth 4 (four) times daily as needed for diarrhea or loose stools.  Marland Kitchen LORazepam (ATIVAN) 0.5 MG tablet Take 0.25-0.5 mg by mouth 2 (two) times daily as needed.  Marland Kitchen losartan (COZAAR) 100 MG tablet Take 100 mg by mouth daily.  . magnesium oxide (MAG-OX) 400 (241.3 Mg) MG tablet Take 1 tablet (400 mg total) by mouth  daily.  . meclizine (ANTIVERT) 50 MG tablet Take 0.5 tablets (25 mg total) by mouth 3 (three) times daily as needed. (Patient taking differently: Take 12.5 mg by mouth 3 (three) times daily as needed for dizziness. )  . propranolol ER (INDERAL LA) 120 MG 24 hr capsule Take 1 capsule (120 mg total) by mouth 2 (two) times daily.  . sertraline (ZOLOFT) 100 MG tablet Take 100 mg by mouth daily.   . traZODone (DESYREL) 50 MG tablet Take 50 mg  by mouth at bedtime.     Cardiac Studies:   Echo 01/23/2017: Normal LV size with EF 60-65%. Moderate diastolic dysfunction.Normal RV size and systolic function. Moderate to severe biatrial enlargement. Mild MR.   EKG:  EKG 11/10/2019: Marked sinus bradycardia at rate of 53 bpm with first-degree AV block, left atrial enlargement, left axis deviation, left anterior fascicular block.  Incomplete right bundle branch block.  LVH.  Poor R wave progression, cannot exclude anteroseptal infarct old. No significant change from EKG 03/12/2019, no change in HR.   Assessment:     ICD-10-CM   1. Paroxysmal atrial fibrillation (HCC)  I48.0 EKG 12-Lead  2. Essential hypertension  I10   3. Orthostatic hypotension  I95.1   4. Stage 3a chronic kidney disease  N18.31    Recommendations:   [name]  is a 86 y.o. Caucasian female  with History of B-cell lymphoma without detectable signs of recurrence, history of CKD stage 3, and pancytopenia, history of essential tremors, essential hypertension, GERD, orthostatic hypotension, venous insufficiency, paroxysmal atrial fibrillation, and degenerative joint disease. She is on amiodarone for management of her A. Fib.  Patient is here for 83-month follow-up visit. Overall, patient has remained stable from a cardiac standpoint. She has not had any syncope, heart racing, or shortness of breath.  Leg edema remains stable with daily leg wrapping.  Patient is extremely frail, although blood pressure is elevated do not feel that I  need to make changes to medication.  I discussed the patient's long-term prognosis, in view of her frail nature and advanced age, ACP was done after discussing with her daughter Katlyn Muldrew.  We will reduce her amiodarone to every other day of 100 mg.  She is presently on Inderal 120 mg BID and reduced to q daily, being used both  Used for hypertension and also tremors. I will see her back in 2 months and have her daughter come in for ACP documentation.   She is on Eliquis for anticoagulation and tolerates this well.  Her essential tremors have been stable.  If she was to have worsening tremors, and needed to go back on primidone, her option would be to changing to Coumadin.   External labs reviewed, renal function and CBC is stable.  40 minute OV encounter.   Adrian Prows, MD, [facility] 11/10/2019, 11:23 AM [facility] [place] Office: [phone]

## 2019-11-09 NOTE — Progress Notes (Signed)
JALAYIA, BAGHERI ([id number]) Visit Report for 11/05/2019 Allergy List Details Patient Name: Date of Service: [name], [name] 11/05/2019 10:30 AM Medical Record [id number] Patient Account Number: [id number] Date of Birth/Sex: Treating RN: [birth year] (86 y.o. [name] Primary Care Alexy Heldt: Simona Huh Other Clinician: Referring Celisse Ciulla: Treating Lary Eckardt/Extender:Robson, [name], [name] in Treatment: 0 Allergies Active Allergies penicillin Reaction: rash Severity: Moderate Sulfa (Sulfonamide Antibiotics) Reaction: itching, nausea, vomiting Severity: Moderate aspirin Reaction: nausea/vomiting Severity: Moderate Benadryl Reaction: nausea/vomiting amoxicillin Reaction: diarrhea, nausea/vomiting Severity: Moderate ibuprofen Reaction: nausea/vomiting Severity: Moderate latex Reaction: hives Severity: Moderate peanut Reaction: itching, swelling Severity: Moderate Allergy Notes Electronic Signature(s) Signed: 11/05/2019 4:22:27 PM By: Levan Hurst RN, BSN Entered By: Levan Hurst on 11/05/2019 11:21:28 -------------------------------------------------------------------------------- Arrival Information Details Patient Name: Date of Service: Mary Favre. 11/05/2019 10:30 AM Medical Record [id number] Patient Account Number: [id number] Date of Birth/Sex: Treating RN: 12/08/1925 (86 y.o. [name] Primary Care Nyilah Kight: Simona Huh Other Clinician: Referring Rita Vialpando: Treating Nelissa Bolduc/Extender:Robson, [name], [name] in Treatment: 0 Visit Information Patient Arrived: Walker Arrival Time: 10:41 Accompanied By: caregiver Transfer Assistance: Manual Patient Identification Verified: Yes Secondary Verification Process Yes Completed: Patient Requires Transmission-Based No Precautions: Patient Has Alerts: Yes Patient Alerts: Patient on Blood Thinner L ABI non compressible History  Since Last Visit Added or deleted any medications: No Any new allergies or adverse reactions: No Had a fall or experienced change in activities of daily living that may affect risk of falls: No Signs or symptoms of abuse/neglect since last visito No Hospitalized since last visit: No Electronic Signature(s) Signed: 11/05/2019 4:22:27 PM By: Levan Hurst RN, BSN Entered By: Levan Hurst on 11/05/2019 10:49:58 -------------------------------------------------------------------------------- Clinic Level of Care Assessment Details Patient Name: Date of Service: [name], [name] 11/05/2019 10:30 AM Medical Record [id number] Patient Account Number: [id number] Date of Birth/Sex: Treating RN: July 27, 1925 (86 y.o. [name] Primary Care Buford Bremer: Simona Huh Other Clinician: Referring Suzann Lazaro: Treating Stellan Vick/Extender:Robson, [name], [name] in Treatment: 0 Clinic Level of Care Assessment Items TOOL 1 Quantity Score X - Use when EandM and Procedure is performed on INITIAL visit 1 0 ASSESSMENTS - Nursing Assessment / Reassessment X - General Physical Exam (combine w/ comprehensive assessment (listed just below) 1 20 1 20  when performed on new pt. evals) X - Comprehensive Assessment (HX, ROS, Risk Assessments, Wounds Hx, etc.) 1 25 ASSESSMENTS - Wound and Skin Assessment / Reassessment []  - Dermatologic / Skin Assessment (not related to wound area) 0 ASSESSMENTS - Ostomy and/or Continence Assessment and Care []  - Incontinence Assessment and Management 0 []  - Ostomy Care Assessment and Management (repouching, etc.) 0 PROCESS - Coordination of Care X - Simple Patient / Family Education for ongoing care 1 15 []  - Complex (extensive) Patient / Family Education for ongoing care 0 X - Staff obtains Programmer, systems, Records, Test Results / Process Orders 1 10 []  - Staff telephones HHA, Nursing Homes / Clarify orders / etc 0 []  - Routine Transfer to another  Facility (non-emergent condition) 0 []  - Routine Hospital Admission (non-emergent condition) 0 X - New Admissions / Biomedical engineer / Ordering NPWT, Apligraf, etc. 1 15 []  - Emergency Hospital Admission (emergent condition) 0 PROCESS - Special Needs []  - Pediatric / Minor Patient Management 0 []  - Isolation Patient Management 0 []  - Hearing / Language / Visual special needs 0 []  - Assessment of Community assistance (transportation, D/C planning, etc.) 0 []  - Additional assistance / Altered mentation 0 []  -  Support Surface(s) Assessment (bed, cushion, seat, etc.) 0 INTERVENTIONS - Miscellaneous []  - External ear exam 0 []  - Patient Transfer (multiple staff / Civil Service fast streamer / Similar devices) 0 []  - Simple Staple / Suture removal (25 or less) 0 []  - Complex Staple / Suture removal (26 or more) 0 []  - Hypo/Hyperglycemic Management (do not check if billed separately) 0 []  - Ankle / Brachial Index (ABI) - do not check if billed separately 0 Has the patient been seen at the hospital within the last three years: Yes Total Score: 85 Level Of Care: New/Established - Level 3 Electronic Signature(s) Signed: 11/05/2019 4:21:34 PM By: Kela Millin Entered By: Kela Millin on 11/05/2019 11:09:09 -------------------------------------------------------------------------------- Compression Therapy Details Patient Name: Date of Service: [name], [name] 11/05/2019 10:30 AM Medical Record [id number] Patient Account Number: [id number] Date of Birth/Sex: Treating RN: 1926/07/14 (86 y.o. [name] Primary Care Nyron Mozer: Simona Huh Other Clinician: Referring Olvin Rohr: Treating Vicki Chaffin/Extender:Robson, [name], [name] in Treatment: 0 Compression Therapy Performed for Wound Wound #7 Left,Proximal,Lateral Lower Leg Assessment: Performed By: Clinician Kela Millin, RN Compression Type: Three Layer Post Procedure Diagnosis Same as  Pre-procedure Electronic Signature(s) Signed: 11/05/2019 4:21:34 PM By: Kela Millin Entered By: Kela Millin on 11/05/2019 11:10:59 -------------------------------------------------------------------------------- Encounter Discharge Information Details Patient Name: Date of Service: [name], [name] 11/05/2019 10:30 AM Medical Record [id number] Patient Account Number: [id number] Date of Birth/Sex: Treating RN: [birth year] (86 y.o. [name] Primary Care Simaya Lumadue: Simona Huh Other Clinician: Referring Sherill Mangen: Treating Lesslie Mossa/Extender:Robson, [name], [name] in Treatment: 0 Encounter Discharge Information Items Discharge Condition: Stable Ambulatory Status: Walker Discharge Destination: Home Transportation: Private Auto Accompanied By: caregiver Schedule Follow-up Appointment: Yes Clinical Summary of Care: Electronic Signature(s) Signed: 11/05/2019 4:06:47 PM By: Deon Pilling Entered By: Deon Pilling on 11/05/2019 11:20:11 -------------------------------------------------------------------------------- Lower Extremity Assessment Details Patient Name: Date of Service: [name], [name] 11/05/2019 10:30 AM Medical Record [id number] Patient Account Number: [id number] Date of Birth/Sex: Treating RN: Jun 29, 1926 (86 y.o. [name] Primary Care Talan Gildner: Simona Huh Other Clinician: Referring Samuell Knoble: Treating Aloma Boch/Extender:Robson, [name], [name] in Treatment: 0 Edema Assessment Assessed: [Left: No] [Right: No] Edema: [Left: Ye] [Right: s] Calf Left: Right: Point of Measurement: 31 cm From Medial Instep 23.6 cm cm Ankle Left: Right: Point of Measurement: 9 cm From Medial Instep 18.4 cm cm Vascular Assessment Pulses: Dorsalis Pedis Palpable: [Left:Yes] Electronic Signature(s) Signed: 11/05/2019 4:22:27 PM By: Levan Hurst RN, BSN Entered By: Levan Hurst on 11/05/2019  10:56:27 -------------------------------------------------------------------------------- Multi Wound Chart Details Patient Name: Date of Service: Mary Favre. 11/05/2019 10:30 AM Medical Record [id number] Patient Account Number: [id number] Date of Birth/Sex: Treating RN: 06-27-1926 (86 y.o. [name] Primary Care Mayetta Castleman: Simona Huh Other Clinician: Referring Justyna Timoney: Treating Meylin Stenzel/Extender:Robson, [name], [name] in Treatment: 0 Vital Signs Height(in): 64 Pulse(bpm): 44 Weight(lbs): 125 Blood Pressure(mmHg): 155/80 Body Mass Index(BMI): 21 Temperature(F): 98.4 Respiratory 16 Rate(breaths/min): Photos: [7:No Photos] [N/A:N/A] Wound Location: [7:Left, Proximal, Lateral Lower Leg] [N/A:N/A] Wounding Event: [7:Blister] [N/A:N/A] Primary Etiology: [7:Venous Leg Ulcer] [N/A:N/A] Comorbid History: [7:Cataracts, Middle ear problems, Anemia, Chronic Obstructive Pulmonary Disease (COPD), Arrhythmia, Deep Vein Thrombosis, Hypertension, Osteoarthritis, Neuropathy, Received Chemotherapy] [N/A:N/A] Date Acquired: [7:09/20/2019] [N/A:N/A] Weeks of Treatment: [7:0] [N/A:N/A] Wound Status: [7:Open] [N/A:N/A] Measurements L x W x D 1x1x0.1 [N/A:N/A] (cm) Area (cm) : [7:0.785] [N/A:N/A] Volume (cm) : [7:0.079] [N/A:N/A] Classification: [7:Full Thickness Without Exposed Support Structures] [N/A:N/A] Exudate Amount: [7:Medium] [N/A:N/A] Exudate Type: [7:Serosanguineous] [N/A:N/A] Exudate Color: [  7:red, brown] [N/A:N/A] Wound Margin: [7:Flat and Intact] [N/A:N/A] Granulation Amount: [7:Large (67-100%)] [N/A:N/A] Granulation Quality: [7:Red] [N/A:N/A] Necrotic Amount: [7:None Present (0%)] [N/A:N/A] Exposed Structures: [7:Fat Layer (Subcutaneous N/A Tissue) Exposed: Yes Fascia: No Tendon: No Muscle: No Joint: No Bone: No] Epithelialization: [7:None] [N/A:N/A] Procedures Performed: Chemical Cauterization [7:Compression Therapy]  [N/A:N/A] Treatment Notes Electronic Signature(s) Signed: 11/05/2019 4:21:34 PM By: Kela Millin Signed: 11/06/2019 6:55:41 AM By: Linton Ham MD Entered By: Linton Ham on 11/05/2019 11:11:22 -------------------------------------------------------------------------------- Multi-Disciplinary Care Plan Details Patient Name: Date of Service: Mary Favre. 11/05/2019 10:30 AM Medical Record [id number] Patient Account Number: [id number] Date of Birth/Sex: Treating RN: Apr 21, 1926 (86 y.o. [name] Primary Care Deleon Passe: Simona Huh Other Clinician: Referring Jaaziah Schulke: Treating Intisar Claudio/Extender:Robson, [name], [name] in Treatment: 0 Active Inactive Venous Leg Ulcer Nursing Diagnoses: Knowledge deficit related to disease process and management Goals: Patient will maintain optimal edema control Date Initiated: 11/05/2019 Target Resolution Date: 12/03/2019 Goal Status: Active Interventions: Provide education on venous insufficiency Notes: Wound/Skin Impairment Nursing Diagnoses: Impaired tissue integrity Goals: Ulcer/skin breakdown will have a volume reduction of 30% by week 4 Date Initiated: 11/05/2019 Target Resolution Date: 12/03/2019 Goal Status: Active Interventions: Provide education on ulcer and skin care Notes: Electronic Signature(s) Signed: 11/05/2019 4:21:34 PM By: Kela Millin Entered By: Kela Millin on 11/05/2019 11:06:13 -------------------------------------------------------------------------------- Pain Assessment Details Patient Name: Date of Service: [name], [name] 11/05/2019 10:30 AM Medical Record [id number] Patient Account Number: [id number] Date of Birth/Sex: Treating RN: September 17, 1925 (86 y.o. [name] Primary Care Taisei Bonnette: Simona Huh Other Clinician: Referring Jasmyn Picha: Treating Emelin Dascenzo/Extender:Robson, [name], [name] in Treatment: 0 Active  Problems Location of Pain Severity and Description of Pain Patient Has Paino No Site Locations Pain Management and Medication Current Pain Management: Electronic Signature(s) Signed: 11/05/2019 4:22:27 PM By: Levan Hurst RN, BSN Entered By: Levan Hurst on 11/05/2019 10:57:28 -------------------------------------------------------------------------------- Patient/Caregiver Education Details Patient Name: Date of Service: Mary Favre 4/16/2021andnbsp10:30 AM Medical Record Patient Account Number: [id number] Number: Treating RN: Kela Millin Date of Birth/Gender: 18-Feb-1926 (86 y.o. F) Other Clinician: Primary Care Physician: Dietrich Pates Referring Physician: Physician/Extender: Nena Alexander in Treatment: 0 Education Assessment Education Provided To: Patient Education Topics Provided Venous: Methods: Explain/Verbal Responses: State content correctly Wound/Skin Impairment: Methods: Explain/Verbal Responses: State content correctly Electronic Signature(s) Signed: 11/05/2019 4:21:34 PM By: Kela Millin Entered By: Kela Millin on 11/05/2019 11:06:26 -------------------------------------------------------------------------------- Wound Assessment Details Patient Name: Date of Service: [name], [name] 11/05/2019 10:30 AM Medical Record [id number] Patient Account Number: [id number] Date of Birth/Sex: Treating RN: March 02, 1926 (86 y.o. [name] Primary Care Laura Radilla: Simona Huh Other Clinician: Referring Aimi Essner: Treating Deshayla Empson/Extender:Robson, [name], [name] in Treatment: 0 Wound Status Wound Number: 7 Primary Venous Leg Ulcer Etiology: Wound Location: Left, Proximal, Lateral Lower Leg Wound Open Wounding Event: Blister Status: Date Acquired: 09/20/2019 Comorbid Cataracts, Middle ear problems, Anemia, Weeks Of Treatment: 0 History: Chronic Obstructive  Pulmonary Disease Clustered Wound: No (COPD), Arrhythmia, Deep Vein Thrombosis, Hypertension, Osteoarthritis, Neuropathy, Received Chemotherapy Photos Wound Measurements Length: (cm) 1 % Reduct Width: (cm) 1 % Reduct Depth: (cm) 0.1 Epitheli Area: (cm) 0.785 Tunneli Volume: (cm) 0.079 Undermi Wound Description Classification: Full Thickness Without Exposed Support Foul Od Structures Slough/ Wound Flat and Intact Margin: Exudate Exudate Medium Amount: Exudate Serosanguineous Type: Exudate red, brown Color: Wound Bed Granulation Amount: Large (67-100%) Granulation Quality: Red Fascia Exp Necrotic Amount: None Present (0%) Fat Layer Tendon Exp Muscle Exp  Joint Expo Bone Expos or After Cleansing: No Fibrino No Exposed Structure osed: No (Subcutaneous Tissue) Exposed: Yes osed: No osed: No sed: No ed: No ion in Area: 0% ion in Volume: 0% alization: None ng: No ning: No Treatment Notes Wound #7 (Left, Proximal, Lateral Lower Leg) 1. Cleanse With Wound Cleanser 2. Periwound Care Moisturizing lotion 3. Primary Dressing Applied Calcium Alginate Ag 4. Secondary Dressing Dry Gauze 6. Support Layer Applied 3 layer compression wrap Notes netting. Electronic Signature(s) Signed: 11/05/2019 4:22:27 PM By: Levan Hurst RN, BSN Signed: 11/09/2019 1:29:28 PM By: Sandre Kitty Entered By: Sandre Kitty on 11/05/2019 15:15:19 -------------------------------------------------------------------------------- Foreman Details Patient Name: Date of Service: [name], [name] 11/05/2019 10:30 AM Medical Record [id number] Patient Account Number: [id number] Date of Birth/Sex: Treating RN: [birth year] (86 y.o. [name] Primary Care Viviano Bir: Simona Huh Other Clinician: Referring Alveria Mcglaughlin: Treating Mikhala Kenan/Extender:Robson, [name], [name] in Treatment: 0 Vital Signs Time Taken: 10:43 Temperature (F): 98.4 Height (in):  64 Pulse (bpm): 55 Source: Stated Respiratory Rate (breaths/min): 16 Weight (lbs): 125 Blood Pressure (mmHg): 155/80 Source: Stated Reference Range: 80 - 120 mg / dl Body Mass Index (BMI): 21.5 Electronic Signature(s) Signed: 11/05/2019 4:22:27 PM By: Levan Hurst RN, BSN Entered By: Levan Hurst on 11/05/2019 10:43:34

## 2019-11-10 ENCOUNTER — Ambulatory Visit: Payer: Medicare Other | Admitting: Cardiology

## 2019-11-10 ENCOUNTER — Other Ambulatory Visit: Payer: Self-pay

## 2019-11-10 ENCOUNTER — Encounter: Payer: Self-pay | Admitting: Cardiology

## 2019-11-10 VITALS — BP 167/80 | HR 52 | Temp 97.6°F | Resp 16 | Ht 63.0 in | Wt 103.0 lb

## 2019-11-10 DIAGNOSIS — R627 Adult failure to thrive: Secondary | ICD-10-CM

## 2019-11-10 DIAGNOSIS — R54 Age-related physical debility: Secondary | ICD-10-CM

## 2019-11-10 DIAGNOSIS — I951 Orthostatic hypotension: Secondary | ICD-10-CM

## 2019-11-10 DIAGNOSIS — I10 Essential (primary) hypertension: Secondary | ICD-10-CM

## 2019-11-10 DIAGNOSIS — I480 Paroxysmal atrial fibrillation: Secondary | ICD-10-CM

## 2019-11-10 DIAGNOSIS — N1831 Chronic kidney disease, stage 3a: Secondary | ICD-10-CM

## 2019-11-10 MED ORDER — AMIODARONE HCL 200 MG PO TABS: 100.0000 mg | ORAL_TABLET | ORAL | 1 refills | Status: DC

## 2019-11-10 NOTE — Progress Notes (Signed)
I discussed the patient's long-term prognosis, in view of her frail nature and advanced age, ACP was done after discussing with her daughter Aleeza [place]. Monon (Nurse aid).

## 2019-11-12 ENCOUNTER — Encounter (HOSPITAL_BASED_OUTPATIENT_CLINIC_OR_DEPARTMENT_OTHER): Payer: Medicare Other | Admitting: Internal Medicine

## 2019-11-12 ENCOUNTER — Other Ambulatory Visit: Payer: Self-pay

## 2019-11-12 ENCOUNTER — Ambulatory Visit: Payer: Medicare Other | Admitting: Podiatry

## 2019-11-12 DIAGNOSIS — L97521 Non-pressure chronic ulcer of other part of left foot limited to breakdown of skin: Secondary | ICD-10-CM | POA: Diagnosis not present

## 2019-11-16 NOTE — Progress Notes (Signed)
BARBA, SOLT ([id number]) Visit Report for 11/12/2019 Arrival Information Details Patient Name: Date of Service: [name], [name] 11/12/2019 10:30 AM Medical Record [id number] Patient Account Number: [id number] Date of Birth/Sex: Treating RN: 10-15-1925 (87 y.o. [name] Primary Care [name]: [name] Other Clinician: Referring [name]: Treating [name]/Extender:[name], [name], [name] in Treatment: 1 Visit Information History Since Last Visit All ordered tests and consults were completed: No Patient Arrived: Wheel Chair Added or deleted any medications: No Arrival Time: 10:52 Any new allergies or adverse reactions: No Accompanied By: caregiver Had a fall or experienced change in No Transfer Assistance: Manual activities of daily living that may affect Patient Identification Verified: Yes risk of falls: Secondary Verification Process Yes Signs or symptoms of abuse/neglect since last visito No Completed: Hospitalized since last visit: No Patient Requires Transmission-Based No Implantable device outside of the clinic excluding No Precautions: cellular tissue based products placed in the center Patient Has Alerts: Yes since last visit: Patient Alerts: Patient on Blood Has Dressing in Place as Prescribed: Yes Thinner Has Compression in Place as Prescribed: Yes L ABI non Pain Present Now: No compressible Electronic Signature(s) Signed: 11/16/2019 5:28:23 PM By: Mary Coria RN Entered By: [name] on 11/12/2019 10:52:39 -------------------------------------------------------------------------------- Compression Therapy Details Patient Name: Date of Service: [name], [name] 11/12/2019 10:30 AM Medical Record [id number] Patient Account Number: [id number] Date of Birth/Sex: Treating RN: 07/17/1926 (86 y.o. [name] Primary Care Simren Popson: [name] Other Clinician: Referring Brigitta Pricer: Treating  Rj Pedrosa/Extender:[name], [name], [name] in Treatment: 1 Compression Therapy Performed for Wound Wound #7 Left,Proximal,Lateral Lower Leg Assessment: Performed By: Clinician Mary Coria, RN Compression Type: Three Layer Electronic Signature(s) Signed: 11/16/2019 5:28:23 PM By: Mary Coria RN Entered By: [name] on 11/12/2019 10:54:40 -------------------------------------------------------------------------------- Encounter Discharge Information Details Patient Name: Date of Service: [name], [name] 11/12/2019 10:30 AM Medical Record [id number] Patient Account Number: [id number] Date of Birth/Sex: Treating RN: [birth year] (86 y.o. [name] Primary Care Amantha Sklar: [name] Other Clinician: Referring Martena Emanuele: Treating Blasa Raisch/Extender:[name], [name], [name] in Treatment: 1 Encounter Discharge Information Items Discharge Condition: Stable Ambulatory Status: Ambulatory Discharge Destination: Home Transportation: Private Auto Accompanied By: caregiver Schedule Follow-up Appointment: Yes Clinical Summary of Care: Patient Declined Electronic Signature(s) Signed: 11/16/2019 5:28:23 PM By: Mary Coria RN Entered By: [name] on 11/12/2019 10:56:29 -------------------------------------------------------------------------------- Patient/Caregiver Education Details Patient Name: Date of Service: [name] 4/23/2021andnbsp10:30 AM Medical Record [id number] Patient Account Number: [id number] Date of Birth/Gender: 1925-07-26 (86 y.o. F) Treating RN: [name] Primary Care Physician: [name] Other Clinician: Referring Physician: Treating Physician/Extender:[name], [name], [name] in Treatment: 1 Education Assessment Education Provided To: Patient Education Topics Provided Wound/Skin Impairment: Methods: Explain/Verbal Responses: State content correctly Electronic  Signature(s) Signed: 11/16/2019 5:28:23 PM By: Mary Coria RN Entered By: [name] on 11/12/2019 10:56:07 -------------------------------------------------------------------------------- Wound Assessment Details Patient Name: Date of Service: [name], [name] 11/12/2019 10:30 AM Medical Record [id number] Patient Account Number: [id number] Date of Birth/Sex: Treating RN: 1926-02-19 (86 y.o. [name] Primary Care Jhamari Markowicz: [name] Other Clinician: Referring Charlen Bakula: Treating Lauralyn Shadowens/Extender:[name], [name], [name] in Treatment: 1 Wound Status Wound Number: 7 Primary Venous Leg Ulcer Wound Location: Left, Proximal, Lateral Lower Leg Etiology: Wounding Event: Blister Wound Open Date Acquired: 09/20/2019 Status: Weeks Of Treatment:1 ComorbidCataracts, Middle ear problems, Anemia, Clustered Wound: No History: Chronic Obstructive Pulmonary Disease (COPD), Arrhythmia, Deep Vein Thrombosis, Hypertension, Osteoarthritis, Neuropathy, Received Chemotherapy Wound Measurements  Length: (cm) 1 % Reduct Width: (cm) 1 % Reduct Depth: (cm) 0.1 Epitheli Area: (cm) 0.785 Tunneli Volume: (cm) 0.079 Undermi Wound Description Classification: Full Thickness Without Exposed Support Foul Odo Structures Slough/F Wound Flat and Intact Margin: Exudate Medium Amount: Exudate Type:Serosanguineous Exudate red, brown red, brown Color: Wound Bed Granulation Amount: Large (67-100%) Granulation Quality: Red Fascia Expo Necrotic Amount: None Present (0%) Fat Layer ( Tendon Expo Muscle Expo Joint Expos Bone Expose r After Cleansing: No ibrino No Exposed Structure sed: No Subcutaneous Tissue) Exposed: Yes sed: No sed: No ed: No d: No ion in Area: 0% ion in Volume: 0% alization: None ng: No ning: No Treatment Notes Wound #7 (Left, Proximal, Lateral Lower Leg) 1. Cleanse With Wound Cleanser Soap and water 2. Periwound Care Barrier  cream 3. Primary Dressing Applied Calcium Alginate Ag 4. Secondary Dressing Dry Gauze 6. Support Layer Applied 3 layer compression wrap Notes netting. Electronic Signature(s) Signed: 11/16/2019 5:28:23 PM By: Mary Coria RN Entered By: [name] on 11/12/2019 10:54:17 -------------------------------------------------------------------------------- [place] Details Patient Name: Date of Service: [name]. 11/12/2019 10:30 AM Medical Record [id number] Patient Account Number: [id number] Date of Birth/Sex: Treating RN: 1926/04/09 (86 y.o. [name] Primary Care Desare Duddy: [name] Other Clinician: Referring Trystian Crisanto: Treating Lavon Horn/Extender:[name], [name], [name] in Treatment: 1 Vital Signs Time Taken: 10:52 Temperature (F): 98.2 Height (in): 64 Pulse (bpm): 67 Weight (lbs): 125 Respiratory Rate (breaths/min): 18 Body Mass Index (BMI): 21.5 Blood Pressure (mmHg): 160/86 Reference Range: 80 - 120 mg / dl Electronic Signature(s) Signed: 11/16/2019 5:28:23 PM By: Mary Coria RN Entered By: [name] on 11/12/2019 10:53:13

## 2019-11-16 NOTE — Progress Notes (Signed)
DORAINE, SCHEXNIDER ([id number]) Visit Report for 11/12/2019 SuperBill Details Patient Name: Date of Service: CHEETARA, HOGE 11/12/2019 Medical Record [id number] Patient Account Number: [id number] Date of Birth/Sex: Treating RN: 12-Apr-1926 (86 y.o. [name] Primary Care Provider: Simona Huh Other Clinician: Referring Provider: Treating Provider/Extender:Alinda Egolf, [name], [name] in Treatment: 1 Diagnosis Coding ICD-10 Codes Code Description S80.12XD Contusion of left lower leg, subsequent encounter I87.322 Chronic venous hypertension (idiopathic) with inflammation of left lower extremity L97.521 Non-pressure chronic ulcer of other part of left foot limited to breakdown of skin Facility Procedures CPT4 Code Description Modifier Quantity [id number] (Facility Use Only) [phone] - [place] 1 Electronic Signature(s) Signed: 11/12/2019 5:22:40 PM By: Linton Ham MD Signed: 11/16/2019 5:28:23 PM By: Carlene Coria RN Entered By: Carlene Coria on 11/12/2019 10:56:45

## 2019-11-19 ENCOUNTER — Other Ambulatory Visit: Payer: Self-pay

## 2019-11-19 ENCOUNTER — Encounter (HOSPITAL_BASED_OUTPATIENT_CLINIC_OR_DEPARTMENT_OTHER): Payer: Medicare Other | Admitting: Internal Medicine

## 2019-11-19 DIAGNOSIS — L97521 Non-pressure chronic ulcer of other part of left foot limited to breakdown of skin: Secondary | ICD-10-CM | POA: Diagnosis not present

## 2019-11-19 NOTE — Progress Notes (Signed)
ESTERLENE, ATIYEH ([id number]) Visit Report for 11/19/2019 HPI Details Patient Name: Date of Service: Miers, [name] 11/19/2019 12:45 PM Medical Record Number: [id number] Patient Account Number: [id number] Date of Birth/Sex: Treating RN: 06-May-1926 (86 y.o. [name] Primary Care Provider: Simona Huh Other Clinician: Referring Provider: Treating Provider/Extender: Joseph Berkshire in Treatment: 2 History of Present Illness HPI Description: 12/04/2018 ADMISSION This is a 86 year old woman who lives independently. She was recently hospitalized from 09/04/2018 through 09/08/2018 and then had an SNF stay at [facility] for a predominant fracture of her distal right humerus. She lives at home within 80 year old husband. Since her arrival home she is apparently had a series of trauma on various wheelchairs resulting in 3 wounds on the front of her right lower leg one on the posterior part of the right leg and one small area on the left anterior leg. They have been dressing this with a foam-based dressing. She apparently has a longstanding history of edema in her lower legs. I note from reviewing her records she has chronic kidney disease COPD A. fib on Eliquis but no mention of a cause of lower extremity edema Past medical history includes Alzheimer's disease, right humerus fracture as discussed, atrial fib, hypertension, history of B-cell lymphoma and MGUS, , COPD and chronic edema ABIs were noncompressible on both sides in our clinic 5/22-86 year old female was started in the wound care clinic last week for wounds on both lower legs. These were described as traumatic right anterior leg, right lateral leg, right distal lateral, right posterior and left leg wounds. With history of venous hypertension and edema in both legs. Arterial ABIs were noncompressible but pulses were palpable silver alginate was used with 3 layer compression 5/29; this patient has a  cluster of wounds on the right mid tibia area related to chronic venous insufficiency and lymphedema also a small area on the left anterior tibia area. We have her with silver alginate and 3 layer compression. The wounds generally look better 6/5; continued improvement on the left there is only a small area medially that is not completely epithelialized. The area on the right mid tibial area has 4 wounds including 3 anteriorly and one posteriorly. Most of these are smaller. We have been using silver alginate 6/12; all the wounds on the left leg are healed. The wounds on the right leg all look smaller and have healthy surfaces. We have been using HFB. She does not yet have a stocking for the left leg 6/19; the patient remains healed on the left leg. The wounds on the right leg are 4. The larger one medially to the tibia 1 over the tibia a small one just lateral to the tibia and a small one on the posterior right calf. Silver alginate. I will change her to [facility] 6/25; the patient remains healed on her left leg and she has a juxta lite stocking today. The wounds on the right leg are smaller and look healthy. We have been using Hydrofera Blue under compression 7/10; the patient has a juxta lite on her left leg. There is only one wound on the right anterior leg left and it looks healthy. Unfortunately home health did not wrap her leg high enough she has some significant edema above the wrap below the knee and some bruising on the posterior part of this but no additional open wound. 3 of the wounds on the right leg have healed 7/24; the patient has a juxta lite  on her left leg. She had a dry scaled area left of the area on the right anterior left lower leg. We have been using Hydrofera Blue under 3 layer compression 7/30; the area on the right medial lower leg is closed. We have been using Hydrofera Blue under 3 layer compression. She can graduate to a juxta lite to her right leg. There  lubricating skin READMISSION 08/12/2019 This is a 86 year old woman that we had in this clinic from May to July 2020. At that point she had wounds on her bilateral lower legs which were traumatic in the setting of edema and skin damage from chronic venous insufficiency. The more difficult wound was an area on the left medial calf but we eventually got this to close over and put her in bilateral juxta lite stockings. They have been compliant with the stockings and everything was going well. Unfortunately the patient was admitted to hospital from 07/19/2019 through 07/24/2019 with Covid and UTI with sepsis physiology. During this time she did not have compression on her legs and edema was fairly bad when she finally returned home. About a week later they noted an open area on the left lateral calf. They have been using Neosporin and her juxta lite stocking but is not closing. This is been open for about 1-1/2 months. The patient's past medical history is reviewed its essentially unchanged. She has atrial fibrillation on Eliquis she is not a diabetic. Although her ABIs are noncompressible bilaterally we have not felt that she had a significant arterial issue sufficient to inhibit healing of these largely venous wounds 1/29; the patient is actually doing quite well. Wound dimensions are down significantly. Her edema control is good. We have been using silver alginate under 3 layer compression. 2/12; wound dimensions continue to contract. Her edema control is good we have been using silver alginate under 3 layer compression 2/19; wound dimensions continue to contract. We have been using silver alginate under 3 layer compression with good results 2/26; the patient's wound on her leg is completely healed. She has severe venous hypertension. She has a juxta light on the right leg and she brought 1 to put on the left leg. READMISSION 11/05/2019 This is a patient with severe bilateral lower extremity skin  fragility secondary to chronic venous insufficiency. Her caregiver it that gives the history the roughly 2 weeks after she was in the clinic last time they noticed a large bruise in the area where the wound is currently probably a small subdural hematoma as well. They did not note any trauma although the patient is on Eliquis and it probably would not take much to result in significant thermal injury. She has a small wound on the left lateral upper calf. They have been using Neosporin and a Band-Aid. She has bilateral juxta lite stockings although they may not be using these with enough tension. Otherwise her past medical history is unchanged 4/30; this is a patient that has a contusion on the left lateral upper leg just below the head of the fibula. We have been using silver alginate. The wound is measuring smaller Electronic Signature(s) Signed: 11/19/2019 6:02:27 PM By: Linton Ham MD Entered By: Linton Ham on 11/19/2019 15:00:12 -------------------------------------------------------------------------------- Physical Exam Details Patient Name: Date of Service: Kowalewski, [name] 11/19/2019 12:45 PM Medical Record Number: [id number] Patient Account Number: [id number] Date of Birth/Sex: Treating RN: [birth year] (86 y.o. [name] Primary Care Provider: Simona Huh Other Clinician: Referring Provider: Treating Provider/Extender: Alesia Banda,  Modena Jansky Weeks in Treatment: 2 Constitutional Patient is hypertensive.. Pulse regular and within target range for patient.Marland Kitchen Respirations regular, non-labored and within target range.. Temperature is normal and within the target range for the patient.Marland Kitchen Appears in no distress. Cardiovascular Peripheral pulses are palpable. Notes Wound exam; small circular wound it is smaller. I did not debride this although there is some flaking dry skin on the margins. No evidence of infection Electronic Signature(s) Signed:  11/19/2019 6:02:27 PM By: Linton Ham MD Entered By: Linton Ham on 11/19/2019 15:02:26 -------------------------------------------------------------------------------- Physician Orders Details Patient Name: Date of Service: Sane, [name] 11/19/2019 12:45 PM Medical Record Number: [id number] Patient Account Number: [id number] Date of Birth/Sex: Treating RN: 25-Apr-1926 (86 y.o. [name] Primary Care Provider: Simona Huh Other Clinician: Referring Provider: Treating Provider/Extender: Joseph Berkshire in Treatment: 2 Verbal / Phone Orders: No Diagnosis Coding ICD-10 Coding Code Description S80.12XD Contusion of left lower leg, subsequent encounter I87.322 Chronic venous hypertension (idiopathic) with inflammation of left lower extremity L97.521 Non-pressure chronic ulcer of other part of left foot limited to breakdown of skin Follow-up Appointments Return Appointment in 1 week. Dressing Change Frequency Do not change entire dressing for one week. Skin Barriers/Peri-Wound Care Moisturizing lotion Wound Cleansing May shower with protection. - cast protector Primary Wound Dressing Wound #7 Left,Proximal,Lateral Lower Leg Calcium Alginate with Silver Secondary Dressing Dry Gauze Edema Control 3 Layer Compression System - Left Lower Extremity Avoid standing for long periods of time Elevate legs to the level of the heart or above for 30 minutes daily and/or when sitting, a frequency of: Exercise regularly Electronic Signature(s) Signed: 11/19/2019 5:51:03 PM By: Kela Millin Signed: 11/19/2019 6:02:27 PM By: Linton Ham MD Entered By: Kela Millin on 11/19/2019 13:08:49 -------------------------------------------------------------------------------- Problem List Details Patient Name: Date of Service: Bayne, [name] 11/19/2019 12:45 PM Medical Record Number: [id number] Patient Account Number: [id number] Date of  Birth/Sex: Treating RN: 1925/09/04 (86 y.o. [name] Primary Care Provider: Simona Huh Other Clinician: Referring Provider: Treating Provider/Extender: Joseph Berkshire in Treatment: 2 Active Problems ICD-10 Encounter Code Description Active Date MDM Diagnosis S80.12XD Contusion of left lower leg, subsequent encounter 11/05/2019 No Yes I87.322 Chronic venous hypertension (idiopathic) with inflammation of left lower 11/05/2019 No Yes extremity L97.521 Non-pressure chronic ulcer of other part of left foot limited to breakdown of 11/05/2019 No Yes skin Inactive Problems Resolved Problems Electronic Signature(s) Signed: 11/19/2019 6:02:27 PM By: Linton Ham MD Entered By: Linton Ham on 11/19/2019 14:59:23 -------------------------------------------------------------------------------- Progress Note Details Patient Name: Date of Service: Parker, [name] 11/19/2019 12:45 PM Medical Record Number: [id number] Patient Account Number: [id number] Date of Birth/Sex: Treating RN: 01/19/1926 (86 y.o. [name] Primary Care Provider: Simona Huh Other Clinician: Referring Provider: Treating Provider/Extender: Joseph Berkshire in Treatment: 2 Subjective History of Present Illness (HPI) 12/04/2018 ADMISSION This is a 86 year old woman who lives independently. She was recently hospitalized from 09/04/2018 through 09/08/2018 and then had an SNF stay at [facility] for a predominant fracture of her distal right humerus. She lives at home within 28 year old husband. Since her arrival home she is apparently had a series of trauma on various wheelchairs resulting in 3 wounds on the front of her right lower leg one on the posterior part of the right leg and one small area on the left anterior leg. They have been dressing this with a foam-based dressing. She apparently has a longstanding history of  edema in her  lower legs. I note from reviewing her records she has chronic kidney disease COPD A. fib on Eliquis but no mention of a cause of lower extremity edema Past medical history includes Alzheimer's disease, right humerus fracture as discussed, atrial fib, hypertension, history of B-cell lymphoma and MGUS, , COPD and chronic edema ABIs were noncompressible on both sides in our clinic 5/22-86 year old female was started in the wound care clinic last week for wounds on both lower legs. These were described as traumatic right anterior leg, right lateral leg, right distal lateral, right posterior and left leg wounds. With history of venous hypertension and edema in both legs. Arterial ABIs were noncompressible but pulses were palpable silver alginate was used with 3 layer compression 5/29; this patient has a cluster of wounds on the right mid tibia area related to chronic venous insufficiency and lymphedema also a small area on the left anterior tibia area. We have her with silver alginate and 3 layer compression. The wounds generally look better 6/5; continued improvement on the left there is only a small area medially that is not completely epithelialized. ooThe area on the right mid tibial area has 4 wounds including 3 anteriorly and one posteriorly. Most of these are smaller. We have been using silver alginate 6/12; all the wounds on the left leg are healed. The wounds on the right leg all look smaller and have healthy surfaces. We have been using HFB. She does not yet have a stocking for the left leg 6/19; the patient remains healed on the left leg. The wounds on the right leg are 4. The larger one medially to the tibia 1 over the tibia a small one just lateral to the tibia and a small one on the posterior right calf. Silver alginate. I will change her to [facility] 6/25; the patient remains healed on her left leg and she has a juxta lite stocking today. The wounds on the right leg are smaller and  look healthy. We have been using Hydrofera Blue under compression 7/10; the patient has a juxta lite on her left leg. There is only one wound on the right anterior leg left and it looks healthy. Unfortunately home health did not wrap her leg high enough she has some significant edema above the wrap below the knee and some bruising on the posterior part of this but no additional open wound. 3 of the wounds on the right leg have healed 7/24; the patient has a juxta lite on her left leg. She had a dry scaled area left of the area on the right anterior left lower leg. We have been using Hydrofera Blue under 3 layer compression 7/30; the area on the right medial lower leg is closed. We have been using Hydrofera Blue under 3 layer compression. She can graduate to a juxta lite to her right leg. There lubricating skin READMISSION 08/12/2019 This is a 86 year old woman that we had in this clinic from May to July 2020. At that point she had wounds on her bilateral lower legs which were traumatic in the setting of edema and skin damage from chronic venous insufficiency. The more difficult wound was an area on the left medial calf but we eventually got this to close over and put her in bilateral juxta lite stockings. They have been compliant with the stockings and everything was going well. Unfortunately the patient was admitted to hospital from 07/19/2019 through 07/24/2019 with Covid and UTI with sepsis physiology. During this time  she did not have compression on her legs and edema was fairly bad when she finally returned home. About a week later they noted an open area on the left lateral calf. They have been using Neosporin and her juxta lite stocking but is not closing. This is been open for about 1-1/2 months. The patient's past medical history is reviewed its essentially unchanged. She has atrial fibrillation on Eliquis she is not a diabetic. Although her ABIs are noncompressible bilaterally we have not  felt that she had a significant arterial issue sufficient to inhibit healing of these largely venous wounds 1/29; the patient is actually doing quite well. Wound dimensions are down significantly. Her edema control is good. We have been using silver alginate under 3 layer compression. 2/12; wound dimensions continue to contract. Her edema control is good we have been using silver alginate under 3 layer compression 2/19; wound dimensions continue to contract. We have been using silver alginate under 3 layer compression with good results 2/26; the patient's wound on her leg is completely healed. She has severe venous hypertension. She has a juxta light on the right leg and she brought 1 to put on the left leg. READMISSION 11/05/2019 This is a patient with severe bilateral lower extremity skin fragility secondary to chronic venous insufficiency. Her caregiver it that gives the history the roughly 2 weeks after she was in the clinic last time they noticed a large bruise in the area where the wound is currently probably a small subdural hematoma as well. They did not note any trauma although the patient is on Eliquis and it probably would not take much to result in significant thermal injury. She has a small wound on the left lateral upper calf. They have been using Neosporin and a Band-Aid. She has bilateral juxta lite stockings although they may not be using these with enough tension. Otherwise her past medical history is unchanged 4/30; this is a patient that has a contusion on the left lateral upper leg just below the head of the fibula. We have been using silver alginate. The wound is measuring smaller Objective Constitutional Patient is hypertensive.. Pulse regular and within target range for patient.Marland Kitchen Respirations regular, non-labored and within target range.. Temperature is normal and within the target range for the patient.Marland Kitchen Appears in no distress. Vitals Time Taken: 1:04 PM, Height: 64 in,  Weight: 125 lbs, BMI: 21.5, Temperature: 98.2 F, Pulse: 47 bpm, Respiratory Rate: 18 breaths/min, Blood Pressure: 178/70 mmHg. Cardiovascular Peripheral pulses are palpable. General Notes: Wound exam; small circular wound it is smaller. I did not debride this although there is some flaking dry skin on the margins. No evidence of infection Integumentary (Hair, Skin) Wound #7 status is Open. Original cause of wound was Blister. The wound is located on the Left,Proximal,Lateral Lower Leg. The wound measures 0.4cm length x 0.3cm width x 0.1cm depth; 0.094cm^2 area and 0.009cm^3 volume. There is Fat Layer (Subcutaneous Tissue) Exposed exposed. There is no tunneling or undermining noted. There is a small amount of serosanguineous drainage noted. The wound margin is flat and intact. There is large (67-100%) red granulation within the wound bed. There is no necrotic tissue within the wound bed. Assessment Active Problems ICD-10 Contusion of left lower leg, subsequent encounter Chronic venous hypertension (idiopathic) with inflammation of left lower extremity Non-pressure chronic ulcer of other part of left foot limited to breakdown of skin Procedures Wound #7 Pre-procedure diagnosis of Wound #7 is a Venous Leg Ulcer located on the Left,Proximal,Lateral Lower  Leg . There was a Three Layer Compression Therapy Procedure by Deon Pilling, RN. Post procedure Diagnosis Wound #7: Same as Pre-Procedure Plan Follow-up Appointments: Return Appointment in 1 week. Dressing Change Frequency: Do not change entire dressing for one week. Skin Barriers/Peri-Wound Care: Moisturizing lotion Wound Cleansing: May shower with protection. - cast protector Primary Wound Dressing: Wound #7 Left,Proximal,Lateral Lower Leg: Calcium Alginate with Silver Secondary Dressing: Dry Gauze Edema Control: 3 Layer Compression System - Left Lower Extremity Avoid standing for long periods of time Elevate legs to the level  of the heart or above for 30 minutes daily and/or when sitting, a frequency of: Exercise regularly 1. We are continue with silver alginate and 3 layer compression if this is not closed by the next time she is here and will move to [facility] likely Electronic Signature(s) Signed: 11/19/2019 6:02:27 PM By: Linton Ham MD Entered By: Linton Ham on 11/19/2019 15:03:03 -------------------------------------------------------------------------------- SuperBill Details Patient Name: Date of Service: Moustafa, [name] 11/19/2019 Medical Record Number: [id number] Patient Account Number: [id number] Date of Birth/Sex: Treating RN: February 20, 1926 (86 y.o. [name] Primary Care Provider: Simona Huh Other Clinician: Referring Provider: Treating Provider/Extender: Joseph Berkshire in Treatment: 2 Diagnosis Coding ICD-10 Codes Code Description [phone] Contusion of left lower leg, subsequent encounter I87.322 Chronic venous hypertension (idiopathic) with inflammation of left lower extremity L97.521 Non-pressure chronic ulcer of other part of left foot limited to breakdown of skin Facility Procedures CPT4 Code: [id number] Description: (Facility Use Only) [phone] - [place] [place] LWR LT LEG Modifier: Quantity: 1 Physician Procedures : CPT4 Code Description Modifier [id number] - WC PHYS LEVEL 3 - EST PT ICD-10 Diagnosis Description S80.12XD Contusion of left lower leg, subsequent encounter I87.322 Chronic venous hypertension (idiopathic) with inflammation of left lower extremity  L97.521 Non-pressure chronic ulcer of other part of left foot limited to breakdown of skin Quantity: 1 Electronic Signature(s) Signed: 11/19/2019 6:02:27 PM By: Linton Ham MD Entered By: Linton Ham on 11/19/2019 15:03:22

## 2019-11-26 ENCOUNTER — Encounter (HOSPITAL_BASED_OUTPATIENT_CLINIC_OR_DEPARTMENT_OTHER): Payer: Medicare Other | Attending: Internal Medicine | Admitting: Internal Medicine

## 2019-11-26 DIAGNOSIS — J449 Chronic obstructive pulmonary disease, unspecified: Secondary | ICD-10-CM | POA: Diagnosis not present

## 2019-11-26 DIAGNOSIS — I129 Hypertensive chronic kidney disease with stage 1 through stage 4 chronic kidney disease, or unspecified chronic kidney disease: Secondary | ICD-10-CM | POA: Diagnosis not present

## 2019-11-26 DIAGNOSIS — X58XXXD Exposure to other specified factors, subsequent encounter: Secondary | ICD-10-CM | POA: Diagnosis not present

## 2019-11-26 DIAGNOSIS — F0280 Dementia in other diseases classified elsewhere without behavioral disturbance: Secondary | ICD-10-CM | POA: Diagnosis not present

## 2019-11-26 DIAGNOSIS — I872 Venous insufficiency (chronic) (peripheral): Secondary | ICD-10-CM | POA: Diagnosis not present

## 2019-11-26 DIAGNOSIS — I4891 Unspecified atrial fibrillation: Secondary | ICD-10-CM | POA: Diagnosis not present

## 2019-11-26 DIAGNOSIS — G309 Alzheimer's disease, unspecified: Secondary | ICD-10-CM | POA: Diagnosis not present

## 2019-11-26 DIAGNOSIS — Z8572 Personal history of non-Hodgkin lymphomas: Secondary | ICD-10-CM | POA: Insufficient documentation

## 2019-11-26 DIAGNOSIS — S8012XD Contusion of left lower leg, subsequent encounter: Secondary | ICD-10-CM | POA: Diagnosis not present

## 2019-11-26 DIAGNOSIS — Z8616 Personal history of COVID-19: Secondary | ICD-10-CM | POA: Diagnosis not present

## 2019-11-26 DIAGNOSIS — L97521 Non-pressure chronic ulcer of other part of left foot limited to breakdown of skin: Secondary | ICD-10-CM | POA: Diagnosis present

## 2019-11-26 DIAGNOSIS — N189 Chronic kidney disease, unspecified: Secondary | ICD-10-CM | POA: Diagnosis not present

## 2019-11-26 DIAGNOSIS — I87322 Chronic venous hypertension (idiopathic) with inflammation of left lower extremity: Secondary | ICD-10-CM | POA: Diagnosis not present

## 2019-11-26 DIAGNOSIS — Z7901 Long term (current) use of anticoagulants: Secondary | ICD-10-CM | POA: Insufficient documentation

## 2019-11-26 NOTE — Progress Notes (Signed)
[name] ([id number]) Visit Report for 11/26/2019 HPI Details Patient Name: Date of Service: [name], [name] 11/26/2019 8:45 A M Medical Record Number: [id number] Patient Account Number: [id number] Date of Birth/Sex: Treating RN: [birth year] (86 y.o. [name] Primary Care Provider: [name] Other Clinician: Referring Provider: Treating Provider/Extender: [name] in Treatment: 3 History of Present Illness HPI Description: 12/04/2018 ADMISSION This is a 86 year old woman who lives independently. She was recently hospitalized from 09/04/2018 through 09/08/2018 and then had an SNF stay at [facility] for a predominant fracture of her distal right humerus. She lives at home within 21 year old husband. Since her arrival home she is apparently had a series of trauma on various wheelchairs resulting in 3 wounds on the front of her right lower leg one on the posterior part of the right leg and one small area on the left anterior leg. They have been dressing this with a foam-based dressing. She apparently has a longstanding history of edema in her lower legs. I note from reviewing her records she has chronic kidney disease COPD A. fib on Eliquis but no mention of a cause of lower extremity edema Past medical history includes Alzheimer's disease, right humerus fracture as discussed, atrial fib, hypertension, history of B-cell lymphoma and MGUS, , COPD and chronic edema ABIs were noncompressible on both sides in our clinic 5/22-86 year old female was started in the wound care clinic last week for wounds on both lower legs. These were described as traumatic right anterior leg, right lateral leg, right distal lateral, right posterior and left leg wounds. With history of venous hypertension and edema in both legs. Arterial ABIs were noncompressible but pulses were palpable silver alginate was used with 3 layer compression 5/29; this patient has a  cluster of wounds on the right mid tibia area related to chronic venous insufficiency and lymphedema also a small area on the left anterior tibia area. We have her with silver alginate and 3 layer compression. The wounds generally look better 6/5; continued improvement on the left there is only a small area medially that is not completely epithelialized. The area on the right mid tibial area has 4 wounds including 3 anteriorly and one posteriorly. Most of these are smaller. We have been using silver alginate 6/12; all the wounds on the left leg are healed. The wounds on the right leg all look smaller and have healthy surfaces. We have been using HFB. She does not yet have a stocking for the left leg 6/19; the patient remains healed on the left leg. The wounds on the right leg are 4. The larger one medially to the tibia 1 over the tibia a small one just lateral to the tibia and a small one on the posterior right calf. Silver alginate. I will change her to [facility] 6/25; the patient remains healed on her left leg and she has a juxta lite stocking today. The wounds on the right leg are smaller and look healthy. We have been using Hydrofera Blue under compression 7/10; the patient has a juxta lite on her left leg. There is only one wound on the right anterior leg left and it looks healthy. Unfortunately home health did not wrap her leg high enough she has some significant edema above the wrap below the knee and some bruising on the posterior part of this but no additional open wound. 3 of the wounds on the right leg have healed 7/24; the patient has a juxta  lite on her left leg. She had a dry scaled area left of the area on the right anterior left lower leg. We have been using Hydrofera Blue under 3 layer compression 7/30; the area on the right medial lower leg is closed. We have been using Hydrofera Blue under 3 layer compression. She can graduate to a juxta lite to her right leg. There  lubricating skin READMISSION 08/12/2019 This is a 86 year old woman that we had in this clinic from May to July 2020. At that point she had wounds on her bilateral lower legs which were traumatic in the setting of edema and skin damage from chronic venous insufficiency. The more difficult wound was an area on the left medial calf but we eventually got this to close over and put her in bilateral juxta lite stockings. They have been compliant with the stockings and everything was going well. Unfortunately the patient was admitted to hospital from 07/19/2019 through 07/24/2019 with Covid and UTI with sepsis physiology. During this time she did not have compression on her legs and edema was fairly bad when she finally returned home. About a week later they noted an open area on the left lateral calf. They have been using Neosporin and her juxta lite stocking but is not closing. This is been open for about 1-1/2 months. The patient's past medical history is reviewed its essentially unchanged. She has atrial fibrillation on Eliquis she is not a diabetic. Although her ABIs are noncompressible bilaterally we have not felt that she had a significant arterial issue sufficient to inhibit healing of these largely venous wounds 1/29; the patient is actually doing quite well. Wound dimensions are down significantly. Her edema control is good. We have been using silver alginate under 3 layer compression. 2/12; wound dimensions continue to contract. Her edema control is good we have been using silver alginate under 3 layer compression 2/19; wound dimensions continue to contract. We have been using silver alginate under 3 layer compression with good results 2/26; the patient's wound on her leg is completely healed. She has severe venous hypertension. She has a juxta light on the right leg and she brought 1 to put on the left leg. READMISSION 11/05/2019 This is a patient with severe bilateral lower extremity skin  fragility secondary to chronic venous insufficiency. Her caregiver it that gives the history the roughly 2 weeks after she was in the clinic last time they noticed a large bruise in the area where the wound is currently probably a small subdural hematoma as well. They did not note any trauma although the patient is on Eliquis and it probably would not take much to result in significant thermal injury. She has a small wound on the left lateral upper calf. They have been using Neosporin and a Band-Aid. She has bilateral juxta lite stockings although they may not be using these with enough tension. Otherwise her past medical history is unchanged 4/30; this is a patient that has a contusion on the left lateral upper leg just below the head of the fibula. We have been using silver alginate. The wound is measuring smaller 5/7; the patient's contusion on the left lateral upper leg just below the head of the fibula is totally closed. She has chronic venous insufficiency. She has bilateral juxta lite stockings which we will apply on the left today Electronic Signature(s) Signed: 11/26/2019 4:59:38 PM By: Linton Ham MD Entered By: Linton Ham on 11/26/2019 09:15:31 -------------------------------------------------------------------------------- Physical Exam Details Patient Name: Date of Service: Frost,  [name] 11/26/2019 8:45 A M Medical Record Number: [id number] Patient Account Number: [id number] Date of Birth/Sex: Treating RN: May 24, 1926 (86 y.o. [name] Primary Care Provider: [name] Other Clinician: Referring Provider: Treating Provider/Extender: [name] in Treatment: 3 Constitutional Patient is hypertensive.. Pulse regular and within target range for patient.Marland Kitchen Respirations regular, non-labored and within target range.. Temperature is normal and within the target range for the patient.Marland Kitchen Appears in no distress. Cardiovascular Pedal  pulses palpable and strong bilaterally.. Integumentary (Hair, Skin) Changes of chronic venous insufficiency. Notes Wound exam; small circular wound is closed. She has severe chronic venous insufficiency but generally good edema control. Peripheral pulses are palpable on the left. She is wearing a juxta lite stocking on the right Electronic Signature(s) Signed: 11/26/2019 4:59:38 PM By: Linton Ham MD Entered By: Linton Ham on 11/26/2019 09:16:44 -------------------------------------------------------------------------------- Physician Orders Details Patient Name: Date of Service: [place], [name] 11/26/2019 8:45 A M Medical Record Number: [id number] Patient Account Number: [id number] Date of Birth/Sex: Treating RN: Feb 08, 1926 (86 y.o. [name] Primary Care Provider: [name] Other Clinician: Referring Provider: Treating Provider/Extender: [name] in Treatment: 3 Verbal / Phone Orders: No Diagnosis Coding ICD-10 Coding Code Description S80.12XD Contusion of left lower leg, subsequent encounter I87.322 Chronic venous hypertension (idiopathic) with inflammation of left lower extremity L97.521 Non-pressure chronic ulcer of other part of left foot limited to breakdown of skin Discharge From [facility] Discharge from [place] Skin Barriers/Peri-Wound Care Moisturizing lotion - daily Wound Cleansing May shower and wash wound with soap and water. Edema Control Avoid standing for long periods of time Elevate legs to the level of the heart or above for 30 minutes daily and/or when sitting, a frequency of: Exercise regularly Support Garment 20-30 mm/Hg pressure to: - juxtalite compression garment both legs daily Electronic Signature(s) Signed: 11/26/2019 4:59:38 PM By: Linton Ham MD Signed: 11/26/2019 5:35:25 PM By: Baruch Gouty RN, BSN Entered By: Baruch Gouty on 11/26/2019  09:13:30 -------------------------------------------------------------------------------- Problem List Details Patient Name: Date of Service: Lipsch[place], [name] 11/26/2019 8:45 A M Medical Record Number: [id number] Patient Account Number: [id number] Date of Birth/Sex: Treating RN: 1926/01/10 (86 y.o. [name] Primary Care Provider: [name] Other Clinician: Referring Provider: Treating Provider/Extender: [name] in Treatment: 3 Active Problems ICD-10 Encounter Code Description Active Date MDM Diagnosis S80.12XD Contusion of left lower leg, subsequent encounter 11/05/2019 No Yes I87.322 Chronic venous hypertension (idiopathic) with inflammation of left lower 11/05/2019 No Yes extremity L97.521 Non-pressure chronic ulcer of other part of left foot limited to breakdown of 11/05/2019 No Yes skin Inactive Problems Resolved Problems Electronic Signature(s) Signed: 11/26/2019 4:59:38 PM By: Linton Ham MD Entered By: Linton Ham on 11/26/2019 09:14:47 -------------------------------------------------------------------------------- Progress Note Details Patient Name: Date of Service: Mccole, [name] 11/26/2019 8:45 A M Medical Record Number: [id number] Patient Account Number: [id number] Date of Birth/Sex: Treating RN: 09-17-1925 (86 y.o. [name] Primary Care Provider: [name] Other Clinician: Referring Provider: Treating Provider/Extender: [name] in Treatment: 3 Subjective History of Present Illness (HPI) 12/04/2018 ADMISSION This is a 86 year old woman who lives independently. She was recently hospitalized from 09/04/2018 through 09/08/2018 and then had an SNF stay at [facility] for a predominant fracture of her distal right humerus. She lives at home within 15 year old husband. Since her arrival home she is apparently had a series of trauma on  various wheelchairs  resulting in 3 wounds on the front of her right lower leg one on the posterior part of the right leg and one small area on the left anterior leg. They have been dressing this with a foam-based dressing. She apparently has a longstanding history of edema in her lower legs. I note from reviewing her records she has chronic kidney disease COPD A. fib on Eliquis but no mention of a cause of lower extremity edema Past medical history includes Alzheimer's disease, right humerus fracture as discussed, atrial fib, hypertension, history of B-cell lymphoma and MGUS, , COPD and chronic edema ABIs were noncompressible on both sides in our clinic 5/22-86 year old female was started in the wound care clinic last week for wounds on both lower legs. These were described as traumatic right anterior leg, right lateral leg, right distal lateral, right posterior and left leg wounds. With history of venous hypertension and edema in both legs. Arterial ABIs were noncompressible but pulses were palpable silver alginate was used with 3 layer compression 5/29; this patient has a cluster of wounds on the right mid tibia area related to chronic venous insufficiency and lymphedema also a small area on the left anterior tibia area. We have her with silver alginate and 3 layer compression. The wounds generally look better 6/5; continued improvement on the left there is only a small area medially that is not completely epithelialized. ooThe area on the right mid tibial area has 4 wounds including 3 anteriorly and one posteriorly. Most of these are smaller. We have been using silver alginate 6/12; all the wounds on the left leg are healed. The wounds on the right leg all look smaller and have healthy surfaces. We have been using HFB. She does not yet have a stocking for the left leg 6/19; the patient remains healed on the left leg. The wounds on the right leg are 4. The larger one medially to the tibia 1 over the tibia a small one  just lateral to the tibia and a small one on the posterior right calf. Silver alginate. I will change her to [facility] 6/25; the patient remains healed on her left leg and she has a juxta lite stocking today. The wounds on the right leg are smaller and look healthy. We have been using Hydrofera Blue under compression 7/10; the patient has a juxta lite on her left leg. There is only one wound on the right anterior leg left and it looks healthy. Unfortunately home health did not wrap her leg high enough she has some significant edema above the wrap below the knee and some bruising on the posterior part of this but no additional open wound. 3 of the wounds on the right leg have healed 7/24; the patient has a juxta lite on her left leg. She had a dry scaled area left of the area on the right anterior left lower leg. We have been using Hydrofera Blue under 3 layer compression 7/30; the area on the right medial lower leg is closed. We have been using Hydrofera Blue under 3 layer compression. She can graduate to a juxta lite to her right leg. There lubricating skin READMISSION 08/12/2019 This is a 86 year old woman that we had in this clinic from May to July 2020. At that point she had wounds on her bilateral lower legs which were traumatic in the setting of edema and skin damage from chronic venous insufficiency. The more difficult wound was an area on the left medial calf but  we eventually got this to close over and put her in bilateral juxta lite stockings. They have been compliant with the stockings and everything was going well. Unfortunately the patient was admitted to hospital from 07/19/2019 through 07/24/2019 with Covid and UTI with sepsis physiology. During this time she did not have compression on her legs and edema was fairly bad when she finally returned home. About a week later they noted an open area on the left lateral calf. They have been using Neosporin and her juxta lite stocking but  is not closing. This is been open for about 1-1/2 months. The patient's past medical history is reviewed its essentially unchanged. She has atrial fibrillation on Eliquis she is not a diabetic. Although her ABIs are noncompressible bilaterally we have not felt that she had a significant arterial issue sufficient to inhibit healing of these largely venous wounds 1/29; the patient is actually doing quite well. Wound dimensions are down significantly. Her edema control is good. We have been using silver alginate under 3 layer compression. 2/12; wound dimensions continue to contract. Her edema control is good we have been using silver alginate under 3 layer compression 2/19; wound dimensions continue to contract. We have been using silver alginate under 3 layer compression with good results 2/26; the patient's wound on her leg is completely healed. She has severe venous hypertension. She has a juxta light on the right leg and she brought 1 to put on the left leg. READMISSION 11/05/2019 This is a patient with severe bilateral lower extremity skin fragility secondary to chronic venous insufficiency. Her caregiver it that gives the history the roughly 2 weeks after she was in the clinic last time they noticed a large bruise in the area where the wound is currently probably a small subdural hematoma as well. They did not note any trauma although the patient is on Eliquis and it probably would not take much to result in significant thermal injury. She has a small wound on the left lateral upper calf. They have been using Neosporin and a Band-Aid. She has bilateral juxta lite stockings although they may not be using these with enough tension. Otherwise her past medical history is unchanged 4/30; this is a patient that has a contusion on the left lateral upper leg just below the head of the fibula. We have been using silver alginate. The wound is measuring smaller 5/7; the patient's contusion on the left  lateral upper leg just below the head of the fibula is totally closed. She has chronic venous insufficiency. She has bilateral juxta lite stockings which we will apply on the left today Objective Constitutional Patient is hypertensive.. Pulse regular and within target range for patient.Marland Kitchen Respirations regular, non-labored and within target range.. Temperature is normal and within the target range for the patient.Marland Kitchen Appears in no distress. Vitals Time Taken: 8:59 AM, Height: 64 in, Weight: 125 lbs, BMI: 21.5, Temperature: 98.0 F, Pulse: 46 bpm, Respiratory Rate: 18 breaths/min, Blood Pressure: 199/74 mmHg. Cardiovascular Pedal pulses palpable and strong bilaterally.. General Notes: Wound exam; small circular wound is closed. She has severe chronic venous insufficiency but generally good edema control. Peripheral pulses are palpable on the left. She is wearing a juxta lite stocking on the right Integumentary (Hair, Skin) Changes of chronic venous insufficiency. Wound #7 status is Open. Original cause of wound was Blister. The wound is located on the Left,Proximal,Lateral Lower Leg. The wound measures 0cm length x 0cm width x 0cm depth; 0cm^2 area and 0cm^3 volume. Assessment  Active Problems ICD-10 Contusion of left lower leg, subsequent encounter Chronic venous hypertension (idiopathic) with inflammation of left lower extremity Non-pressure chronic ulcer of other part of left foot limited to breakdown of skin Plan Discharge From [facility] Services: Discharge from [place] Skin Barriers/Peri-Wound Care: Moisturizing lotion - daily Wound Cleansing: May shower and wash wound with soap and water. Edema Control: Avoid standing for long periods of time Elevate legs to the level of the heart or above for 30 minutes daily and/or when sitting, a frequency of: Exercise regularly Support Garment 20-30 mm/Hg pressure to: - juxtalite compression garment both legs daily 1. The patient's wound is  closed over 2. She has very fragile skin bilaterally related to chronic venous insufficiency. She is going to need to wear stockings indefinitely. We went over this with the patient and her caregiver. 3. Reasonable risk for recidivism however we will have to see how this goes over time Electronic Signature(s) Signed: 11/26/2019 4:59:38 PM By: Linton Ham MD Entered By: Linton Ham on 11/26/2019 09:19:26 -------------------------------------------------------------------------------- SuperBill Details Patient Name: Date of Service: [place], [name] 11/26/2019 Medical Record Number: [id number] Patient Account Number: [id number] Date of Birth/Sex: Treating RN: 1926-04-26 (86 y.o. [name] Primary Care Provider: [name] Other Clinician: Referring Provider: Treating Provider/Extender: [name] in Treatment: 3 Diagnosis Coding ICD-10 Codes Code Description [phone] Contusion of left lower leg, subsequent encounter I87.322 Chronic venous hypertension (idiopathic) with inflammation of left lower extremity L97.521 Non-pressure chronic ulcer of other part of left foot limited to breakdown of skin Facility Procedures CPT4 Code: [id number] Description: 99213 - WOUND CARE VISIT-LEV 3 EST PT Modifier: Quantity: 1 Physician Procedures : CPT4 Code Description Modifier [id number] 99213 - WC PHYS LEVEL 3 - EST PT ICD-10 Diagnosis Description S80.12XD Contusion of left lower leg, subsequent encounter I87.322 Chronic venous hypertension (idiopathic) with inflammation of left lower extremity  L97.521 Non-pressure chronic ulcer of other part of left foot limited to breakdown of skin Quantity: 1 Electronic Signature(s) Signed: 11/26/2019 4:59:38 PM By: Linton Ham MD Entered By: Linton Ham on 11/26/2019 09:19:42

## 2019-11-30 NOTE — Progress Notes (Signed)
[name] ([id number]) Visit Report for 11/26/2019 Arrival Information Details Patient Name: Date of Service: Pozo, DO Mary P. 11/26/2019 8:45 A M Medical Record Number: [id number] Patient Account Number: [id number] Date of Birth/Sex: Treating RN: 08-21-1925 (86 y.o. [name] Primary Care [name]: [name] Other Clinician: Referring [name]: Treating [name]/Extender: [name] in Treatment: 3 Visit Information History Since Last Visit Added or deleted any medications: No Patient Arrived: Wheel Chair Any Mary allergies or adverse reactions: No Arrival Time: 08:56 Had a fall or experienced change in No Accompanied By: caregiver activities of daily living that may affect Transfer Assistance: None risk of falls: Patient Identification Verified: Yes Signs or symptoms of abuse/neglect since last visito No Secondary Verification Process Completed: Yes Hospitalized since last visit: No Patient Requires Transmission-Based Precautions: No Implantable device outside of the clinic excluding No Patient Has Alerts: Yes cellular tissue based products placed in the center Patient Alerts: Patient on Blood Thinner since last visit: L ABI non compressible Has Dressing in Place as Prescribed: Yes Pain Present Now: No Electronic Signature(s) Signed: 11/30/2019 8:52:48 AM By: [name] Entered By: [name] on 11/26/2019 08:59:07 -------------------------------------------------------------------------------- Clinic Level of Care Assessment Details Patient Name: Date of Service: Patty, DO Mary P. 11/26/2019 8:45 A M Medical Record Number: [id number] Patient Account Number: [id number] Date of Birth/Sex: Treating RN: 22-Aug-1925 (86 y.o. [name] Primary Care [name]: [name] Other Clinician: Referring [name]: Treating [name]/Extender: [name] in Treatment: 3 Clinic  Level of Care Assessment Items TOOL 4 Quantity Score []  - 0 Use when only an EandM is performed on FOLLOW-UP visit ASSESSMENTS - Nursing Assessment / Reassessment X- 1 10 Reassessment of Co-morbidities (includes updates in patient status) X- 1 5 Reassessment of Adherence to Treatment Plan ASSESSMENTS - Wound and Skin A ssessment / Reassessment X - Simple Wound Assessment / Reassessment - one wound 1 5 []  - 0 Complex Wound Assessment / Reassessment - multiple wounds []  - 0 Dermatologic / Skin Assessment (not related to wound area) ASSESSMENTS - Focused Assessment []  - 0 Circumferential Edema Measurements - multi extremities []  - 0 Nutritional Assessment / Counseling / Intervention X- 1 5 Lower Extremity Assessment (monofilament, tuning fork, pulses) []  - 0 Peripheral Arterial Disease Assessment (using hand held doppler) ASSESSMENTS - Ostomy and/or Continence Assessment and Care []  - 0 Incontinence Assessment and Management []  - 0 Ostomy Care Assessment and Management (repouching, etc.) PROCESS - Coordination of Care X - Simple Patient / Family Education for ongoing care 1 15 []  - 0 Complex (extensive) Patient / Family Education for ongoing care X- 1 10 Staff obtains Programmer, systems, Records, T Results / Process Orders est []  - 0 Staff telephones HHA, Nursing Homes / Clarify orders / etc []  - 0 Routine Transfer to another Facility (non-emergent condition) []  - 0 Routine Hospital Admission (non-emergent condition) []  - 0 Mary Admissions / Biomedical engineer / Ordering NPWT Apligraf, etc. , []  - 0 Emergency Hospital Admission (emergent condition) X- 1 10 Simple Discharge Coordination []  - 0 Complex (extensive) Discharge Coordination PROCESS - Special Needs []  - 0 Pediatric / Minor Patient Management []  - 0 Isolation Patient Management []  - 0 Hearing / Language / Visual special needs []  - 0 Assessment of Community assistance (transportation, D/C planning, etc.) []   - 0 Additional assistance / Altered mentation []  - 0 Support Surface(s) Assessment (bed, cushion, seat, etc.) INTERVENTIONS - Wound Cleansing / Measurement X - Simple Wound  Cleansing - one wound 1 5 []  - 0 Complex Wound Cleansing - multiple wounds X- 1 5 Wound Imaging (photographs - any number of wounds) []  - 0 Wound Tracing (instead of photographs) X- 1 5 Simple Wound Measurement - one wound []  - 0 Complex Wound Measurement - multiple wounds INTERVENTIONS - Wound Dressings []  - 0 Small Wound Dressing one or multiple wounds []  - 0 Medium Wound Dressing one or multiple wounds []  - 0 Large Wound Dressing one or multiple wounds []  - 0 Application of Medications - topical []  - 0 Application of Medications - injection INTERVENTIONS - Miscellaneous []  - 0 External ear exam []  - 0 Specimen Collection (cultures, biopsies, blood, body fluids, etc.) []  - 0 Specimen(s) / Culture(s) sent or taken to Lab for analysis []  - 0 Patient Transfer (multiple staff / Civil Service fast streamer / Similar devices) []  - 0 Simple Staple / Suture removal (25 or less) []  - 0 Complex Staple / Suture removal (26 or more) []  - 0 Hypo / Hyperglycemic Management (close monitor of Blood Glucose) []  - 0 Ankle / Brachial Index (ABI) - do not check if billed separately X- 1 5 Vital Signs Has the patient been seen at the hospital within the last three years: Yes Total Score: 80 Level Of Care: Mary/Established - Level 3 Electronic Signature(s) Signed: 11/26/2019 5:35:25 PM By: Mary Gouty RN, BSN Entered By: [name] on 11/26/2019 09:13:58 -------------------------------------------------------------------------------- Encounter Discharge Information Details Patient Name: Date of Service: Mary Richmond, DO Mary P. 11/26/2019 8:45 A M Medical Record Number: [id number] Patient Account Number: [id number] Date of Birth/Sex: Treating RN: 1926/02/12 (86 y.o. [name] Primary Care [name]: [name]  Other Clinician: Referring [name]: Treating [name]/Extender: [name] in Treatment: 3 Encounter Discharge Information Items Discharge Condition: Stable Ambulatory Status: Walker Discharge Destination: Home Transportation: Private Auto Accompanied By: caregiver Schedule Follow-up Appointment: No Clinical Summary of Care: Notes demonstrated and educated how to apply juxtalite to left leg. rechecked BP 169/57, heart rate 48. Electronic Signature(s) Signed: 11/26/2019 4:59:21 PM By: Deon Pilling Entered By: Deon Pilling on 11/26/2019 09:25:12 -------------------------------------------------------------------------------- Lower Extremity Assessment Details Patient Name: Date of Service: Largent, DO Mary P. 11/26/2019 8:45 A M Medical Record Number: [id number] Patient Account Number: [id number] Date of Birth/Sex: Treating RN: Dec 09, 1925 (86 y.o. [name] Primary Care Mekesha Solomon: [name] Other Clinician: Referring Joyanne Eddinger: Treating Charon Smedberg/Extender: [name] in Treatment: 3 Edema Assessment Assessed: [Left: No] [Right: No] Edema: [Left: N] [Right: o] Calf Left: Right: Point of Measurement: 31 cm From Medial Instep 25 cm cm Ankle Left: Right: Point of Measurement: 9 cm From Medial Instep 18.4 cm cm Vascular Assessment Pulses: Dorsalis Pedis Palpable: [Left:Yes] Electronic Signature(s) Signed: 11/26/2019 5:35:25 PM By: Mary Gouty RN, BSN Entered By: [name] on 11/26/2019 09:12:19 -------------------------------------------------------------------------------- Multi Wound Chart Details Patient Name: Date of Service: Lagoy, DO Mary P. 11/26/2019 8:45 A M Medical Record Number: [id number] Patient Account Number: [id number] Date of Birth/Sex: Treating RN: [birth year] (86 y.o. [name] Primary Care Art Levan: [name] Other Clinician: Referring Lord Lancour: Treating  Krystian Ferrentino/Extender: [name] in Treatment: 3 Vital Signs Height(in): 64 Pulse(bpm): 8 Weight(lbs): 125 Blood Pressure(mmHg): 199/74 Body Mass Index(BMI): 21 Temperature(F): 98.0 Respiratory Rate(breaths/min): 18 Photos: [7:No Photos Left, Proximal, Lateral Lower Leg] [N/A:N/A N/A] Wound Location: [7:Blister] [N/A:N/A] Wounding Event: [7:Venous Leg Ulcer] [N/A:N/A] Primary Etiology: [7:09/20/2019] [N/A:N/A] Date Acquired: [7:3] [N/A:N/A] Weeks of Treatment: [7:Open] [N/A:N/A] Wound Status: [  7:0x0x0] [N/A:N/A] Measurements L x W x D (cm) [7:0] [N/A:N/A] A (cm) : rea [7:0] [N/A:N/A] Volume (cm) : [7:100.00%] [N/A:N/A] % Reduction in A rea: [7:100.00%] [N/A:N/A] % Reduction in Volume: [7:Full Thickness Without Exposed] [N/A:N/A] Classification: [7:Support Structures] Treatment Notes Electronic Signature(s) Signed: 11/26/2019 4:59:38 PM By: Linton Ham MD Signed: 11/26/2019 5:35:25 PM By: Mary Gouty RN, BSN Entered By: Linton Ham on 11/26/2019 09:14:53 -------------------------------------------------------------------------------- Multi-Disciplinary Care Plan Details Patient Name: Date of Service: Madalyn Rob, DO Mary P. 11/26/2019 8:45 A M Medical Record Number: [id number] Patient Account Number: [id number] Date of Birth/Sex: Treating RN: Nov 21, 1925 (86 y.o. [name] Primary Care Felisia Balcom: [name] Other Clinician: Referring Cormac Wint: Treating Jareli Highland/Extender: [name] in Treatment: 3 Active Inactive Electronic Signature(s) Signed: 11/26/2019 5:35:25 PM By: Mary Gouty RN, BSN Entered By: [name] on 11/26/2019 09:17:30 -------------------------------------------------------------------------------- Pain Assessment Details Patient Name: Date of Service: Erlich, DO Mary P. 11/26/2019 8:45 A M Medical Record Number: [id number] Patient Account Number: [id number] Date of  Birth/Sex: Treating RN: [birth year] (86 y.o. [name] Primary Care Jamerion Cabello: [name] Other Clinician: Referring Varick Keys: Treating Cleva Camero/Extender: [name] in Treatment: 3 Active Problems Location of Pain Severity and Description of Pain Patient Has Paino No Site Locations Pain Management and Medication Current Pain Management: Electronic Signature(s) Signed: 11/26/2019 5:35:25 PM By: Mary Gouty RN, BSN Signed: 11/30/2019 8:52:48 AM By: [name] Entered By: [name] on 11/26/2019 09:01:26 -------------------------------------------------------------------------------- Patient/Caregiver Education Details Patient Name: Date of Service: Madalyn Rob, DO Mary P. 5/7/2021andnbsp8:45 A M Medical Record Number: [id number] Patient Account Number: [id number] Date of Birth/Gender: Treating RN: 1926/01/28 (86 y.o. [name] Primary Care Physician: [name] Other Clinician: Referring Physician: Treating Physician/Extender: [name] in Treatment: 3 Education Assessment Education Provided To: Patient Education Topics Provided Venous: Methods: Explain/Verbal Responses: Reinforcements needed, State content correctly Wound/Skin Impairment: Methods: Explain/Verbal Responses: Reinforcements needed, State content correctly Electronic Signature(s) Signed: 11/26/2019 5:35:25 PM By: Mary Gouty RN, BSN Entered By: [name] on 11/26/2019 08:56:56 -------------------------------------------------------------------------------- Wound Assessment Details Patient Name: Date of Service: Guardino, DO Mary P. 11/26/2019 8:45 A M Medical Record Number: [id number] Patient Account Number: [id number] Date of Birth/Sex: Treating RN: [birth year] (86 y.o. [name] Primary Care Leshonda Galambos: [name] Other Clinician: Referring Jireh Elmore: Treating Tayvian Holycross/Extender:  [name] in Treatment: 3 Wound Status Wound Number: 7 Primary Etiology: Venous Leg Ulcer Wound Location: Left, Proximal, Lateral Lower Leg Wound Status: Open Wounding Event: Blister Date Acquired: 09/20/2019 Weeks Of Treatment: 3 Clustered Wound: No Photos Photo Uploaded By: Mikeal Hawthorne on 11/30/2019 08:51:46 Wound Measurements Length: (cm) Width: (cm) Depth: (cm) Area: (cm) Volume: (cm) 0 % Reduction in Area: 100% 0 % Reduction in Volume: 100% 0 0 0 Wound Description Classification: Full Thickness Without Exposed Support Structur Electronic Signature(s) Signed: 11/26/2019 5:35:25 PM By: Mary Gouty RN, BSN Signed: 11/30/2019 8:52:48 AM By: [name] Entered By: Maree Krabbe, Destiny on 11/26/2019 09:04:36 -------------------------------------------------------------------------------- Vitals Details Patient Name: Date of Service: Couser, DO Mary P. 11/26/2019 8:45 A M Medical Record Number: [id number] Patient Account Number: [id number] Date of Birth/Sex: Treating RN: Dec 06, 1925 (86 y.o. [name] Primary Care Tanieka Pownall: [name] Other Clinician: Referring Laquinton Bihm: Treating Ibtisam Benge/Extender: [name] in Treatment: 3 Vital Signs Time Taken: 08:59 Temperature (F): 98.0 Height (in): 64 Pulse (bpm): 46 Weight (lbs): 125 Respiratory Rate (breaths/min): 18 Body Mass Index (BMI): 21.5  Blood Pressure (mmHg): 199/74 Reference Range: 80 - 120 mg / dl Electronic Signature(s) Signed: 11/30/2019 8:52:48 AM By: [name] Entered By: [name] on 11/26/2019 09:01:22

## 2019-12-01 NOTE — Progress Notes (Signed)
HALI, BALGOBIN ([id number]) Visit Report for 11/19/2019 Arrival Information Details Patient Name: Date of Service: [name], [name] RIS P. 11/19/2019 12:45 PM Medical Record Number: [id number] Patient Account Number: [id number] Date of Birth/Sex: Treating RN: 1925-12-11 (86 y.o. [name] Primary Care Lonnetta Kniskern: Simona Huh Other Clinician: Referring Amauri Keefe: Treating Rhydian Baldi/Extender: Joseph Berkshire in Treatment: 2 Visit Information History Since Last Visit Added or deleted any medications: No Patient Arrived: Wheel Chair Any new allergies or adverse reactions: No Arrival Time: 13:02 Had a fall or experienced change in No Accompanied By: care giver activities of daily living that may affect Transfer Assistance: None risk of falls: Patient Identification Verified: Yes Signs or symptoms of abuse/neglect since last visito No Secondary Verification Process Completed: Yes Hospitalized since last visit: No Patient Requires Transmission-Based Precautions: No Implantable device outside of the clinic excluding No Patient Has Alerts: Yes cellular tissue based products placed in the center Patient Alerts: Patient on Blood Thinner since last visit: L ABI non compressible Has Dressing in Place as Prescribed: Yes Pain Present Now: No Electronic Signature(s) Signed: 12/01/2019 9:09:16 AM By: Sandre Kitty Entered By: Sandre Kitty on 11/19/2019 13:04:17 -------------------------------------------------------------------------------- Compression Therapy Details Patient Name: Date of Service: [name], [name] RIS P. 11/19/2019 12:45 PM Medical Record Number: [id number] Patient Account Number: [id number] Date of Birth/Sex: Treating RN: 03/15/1926 (86 y.o. [name] Primary Care Reginald Mangels: Simona Huh Other Clinician: Referring Candler Ginsberg: Treating Laurelin Elson/Extender: Joseph Berkshire in Treatment: 2 Compression  Therapy Performed for Wound Assessment: Wound #7 Left,Proximal,Lateral Lower Leg Performed By: Clinician Deon Pilling, RN Compression Type: Three Layer Post Procedure Diagnosis Same as Pre-procedure Electronic Signature(s) Signed: 11/19/2019 5:51:03 PM By: Kela Millin Entered By: Kela Millin on 11/19/2019 13:47:56 -------------------------------------------------------------------------------- Encounter Discharge Information Details Patient Name: Date of Service: [name], [name] RIS P. 11/19/2019 12:45 PM Medical Record Number: [id number] Patient Account Number: [id number] Date of Birth/Sex: Treating RN: 02-16-1926 (86 y.o. [name] Primary Care Maralyn Witherell: Simona Huh Other Clinician: Referring Mandee Pluta: Treating Belina Mandile/Extender: Joseph Berkshire in Treatment: 2 Encounter Discharge Information Items Discharge Condition: Stable Ambulatory Status: Walker Discharge Destination: Home Transportation: Private Auto Accompanied By: caregiver Schedule Follow-up Appointment: Yes Clinical Summary of Care: Electronic Signature(s) Signed: 11/19/2019 5:38:02 PM By: Deon Pilling Entered By: Deon Pilling on 11/19/2019 14:01:55 -------------------------------------------------------------------------------- Lower Extremity Assessment Details Patient Name: Date of Service: [name], [name] RIS P. 11/19/2019 12:45 PM Medical Record Number: [id number] Patient Account Number: [id number] Date of Birth/Sex: Treating RN: 04/30/1926 (86 y.o. [name] Primary Care Fiza Nation: Simona Huh Other Clinician: Referring Helia Haese: Treating Oluwademilade Kellett/Extender: Joseph Berkshire in Treatment: 2 Edema Assessment Assessed: [Left: No] [Right: No] Edema: [Left: Ye] [Right: s] Calf Left: Right: Point of Measurement: 31 cm From Medial Instep 25 cm cm Ankle Left: Right: Point of Measurement: 9 cm From Medial Instep 18.4 cm  cm Vascular Assessment Pulses: Dorsalis Pedis Palpable: [Left:Yes] Electronic Signature(s) Signed: 11/19/2019 5:50:39 PM By: Levan Hurst RN, BSN Entered By: Levan Hurst on 11/19/2019 13:15:24 -------------------------------------------------------------------------------- Multi-Disciplinary Care Plan Details Patient Name: Date of Service: Ran, [name] RIS P. 11/19/2019 12:45 PM Medical Record Number: [id number] Patient Account Number: [id number] Date of Birth/Sex: Treating RN: Mar 09, 1926 (86 y.o. [name] Primary Care Brilee Port: Simona Huh Other Clinician: Referring Raelynne Ludwick: Treating Tasmia Blumer/Extender: Joseph Berkshire in Treatment: 2 Active Inactive Venous Leg Ulcer Nursing Diagnoses: Knowledge deficit related to disease process and management Goals: Patient will  maintain optimal edema control Date Initiated: 11/05/2019 Target Resolution Date: 12/03/2019 Goal Status: Active Interventions: Provide education on venous insufficiency Notes: Wound/Skin Impairment Nursing Diagnoses: Impaired tissue integrity Goals: Ulcer/skin breakdown will have a volume reduction of 30% by week 4 Date Initiated: 11/05/2019 Target Resolution Date: 12/03/2019 Goal Status: Active Interventions: Provide education on ulcer and skin care Notes: Electronic Signature(s) Signed: 11/19/2019 5:51:03 PM By: Kela Millin Entered By: Kela Millin on 11/19/2019 13:08:56 -------------------------------------------------------------------------------- Pain Assessment Details Patient Name: Date of Service: [name], [name] RIS P. 11/19/2019 12:45 PM Medical Record Number: [id number] Patient Account Number: [id number] Date of Birth/Sex: Treating RN: 10/30/1925 (86 y.o. [name] Primary Care Niklas Chretien: Simona Huh Other Clinician: Referring Churchill Grimsley: Treating Ethan Kasperski/Extender: Joseph Berkshire in Treatment:  2 Active Problems Location of Pain Severity and Description of Pain Patient Has Paino No Site Locations Pain Management and Medication Current Pain Management: Electronic Signature(s) Signed: 11/19/2019 5:51:03 PM By: Kela Millin Signed: 12/01/2019 9:09:16 AM By: Sandre Kitty Entered By: Sandre Kitty on 11/19/2019 13:04:46 -------------------------------------------------------------------------------- Patient/Caregiver Education Details Patient Name: Date of Service: Mary Rob, [name] RIS P. 4/30/2021andnbsp12:45 PM Medical Record Number: [id number] Patient Account Number: [id number] Date of Birth/Gender: Treating RN: 1926/06/28 (86 y.o. [name] Primary Care Physician: Simona Huh Other Clinician: Referring Physician: Treating Physician/Extender: Joseph Berkshire in Treatment: 2 Education Assessment Education Provided To: Patient Education Topics Provided Venous: Handouts: Managing Venous Disease and Related Ulcers Methods: Explain/Verbal Responses: State content correctly Wound/Skin Impairment: Handouts: Caring for Your Ulcer Methods: Explain/Verbal Responses: State content correctly Electronic Signature(s) Signed: 11/19/2019 5:51:03 PM By: Kela Millin Entered By: Kela Millin on 11/19/2019 13:10:07 -------------------------------------------------------------------------------- Wound Assessment Details Patient Name: Date of Service: Hine, [name] RIS P. 11/19/2019 12:45 PM Medical Record Number: [id number] Patient Account Number: [id number] Date of Birth/Sex: Treating RN: Feb 24, 1926 (86 y.o. [name] Primary Care Chaylee Ehrsam: Simona Huh Other Clinician: Referring Abie Cheek: Treating Weslee Fogg/Extender: Joseph Berkshire in Treatment: 2 Wound Status Wound Number: 7 Primary Venous Leg Ulcer Etiology: Wound Location: Left, Proximal, Lateral Lower Leg Wound  Open Wounding Event: Blister Status: Date Acquired: 09/20/2019 Comorbid Cataracts, Middle ear problems, Anemia, Chronic Obstructive Weeks Of Treatment: 2 History: Pulmonary Disease (COPD), Arrhythmia, Deep Vein Thrombosis, Clustered Wound: No Hypertension, Osteoarthritis, Neuropathy, Received Chemotherapy Photos Photo Uploaded By: Mikeal Hawthorne on 11/22/2019 14:52:45 Wound Measurements Length: (cm) 0.4 Width: (cm) 0.3 Depth: (cm) 0.1 Area: (cm) 0.094 Volume: (cm) 0.009 % Reduction in Area: 88% % Reduction in Volume: 88.6% Epithelialization: Medium (34-66%) Tunneling: No Undermining: No Wound Description Classification: Full Thickness Without Exposed Support Structures Wound Margin: Flat and Intact Exudate Amount: Small Exudate Type: Serosanguineous Exudate Color: red, brown Foul Odor After Cleansing: No Slough/Fibrino No Wound Bed Granulation Amount: Large (67-100%) Exposed Structure Granulation Quality: Red Fascia Exposed: No Necrotic Amount: None Present (0%) Fat Layer (Subcutaneous Tissue) Exposed: Yes Tendon Exposed: No Muscle Exposed: No Joint Exposed: No Bone Exposed: No Electronic Signature(s) Signed: 11/19/2019 5:50:39 PM By: Levan Hurst RN, BSN Signed: 11/19/2019 5:51:03 PM By: Kela Millin Entered By: Levan Hurst on 11/19/2019 13:15:13 -------------------------------------------------------------------------------- Amelia Details Patient Name: Date of Service: Buckel, [name] RIS P. 11/19/2019 12:45 PM Medical Record Number: [id number] Patient Account Number: [id number] Date of Birth/Sex: Treating RN: 17-Apr-1926 (86 y.o. [name] Primary Care Lynnix Schoneman: Other Clinician: Simona Huh Referring Heavin Sebree: Treating Danaisha Celli/Extender: Joseph Berkshire in Treatment: 2 Vital Signs Time Taken: 13:04 Temperature (F): 98.2 Height (in): 64 Pulse (  bpm): 47 Weight (lbs): 125 Respiratory Rate (breaths/min):  18 Body Mass Index (BMI): 21.5 Blood Pressure (mmHg): 178/70 Reference Range: 80 - 120 mg / dl Electronic Signature(s) Signed: 12/01/2019 9:09:16 AM By: Sandre Kitty Entered By: Sandre Kitty on 11/19/2019 13:04:36

## 2019-12-31 ENCOUNTER — Other Ambulatory Visit: Payer: Self-pay

## 2019-12-31 ENCOUNTER — Ambulatory Visit: Payer: Medicare Other | Admitting: Podiatry

## 2019-12-31 DIAGNOSIS — B351 Tinea unguium: Secondary | ICD-10-CM

## 2019-12-31 DIAGNOSIS — M2042 Other hammer toe(s) (acquired), left foot: Secondary | ICD-10-CM | POA: Diagnosis not present

## 2019-12-31 DIAGNOSIS — M79674 Pain in right toe(s): Secondary | ICD-10-CM

## 2019-12-31 DIAGNOSIS — M2041 Other hammer toe(s) (acquired), right foot: Secondary | ICD-10-CM | POA: Diagnosis not present

## 2019-12-31 DIAGNOSIS — D689 Coagulation defect, unspecified: Secondary | ICD-10-CM | POA: Diagnosis not present

## 2019-12-31 DIAGNOSIS — M79675 Pain in left toe(s): Secondary | ICD-10-CM | POA: Diagnosis not present

## 2020-01-02 ENCOUNTER — Encounter: Payer: Self-pay | Admitting: Podiatry

## 2020-01-02 NOTE — Progress Notes (Signed)
  Subjective:  Patient ID: [name], female    DOB: [birth year],  MRN: [id number]  Chief Complaint  Patient presents with  . Nail Problem    thick painful toenails, 3 month follow up   86 y.o. female returns for the above complaint.  Patient presents with thickened elongated mycotic toenails x10.  Patient says it is painful to walk on.  She has not been able to treat them as they are getting very thick and unable to be debrided.  Patient would like to know if she can get them debrided here today.  Patient is currently on Eliquis.  She denies any other acute complaints.  Objective:  There were no vitals filed for this visit. Podiatric Exam: Vascular: dorsalis pedis and posterior tibial pulses are palpable bilateral. Capillary return is immediate. Temperature gradient is WNL. Skin turgor WNL  Sensorium: Normal Semmes Weinstein monofilament test. Normal tactile sensation bilaterally. Nail Exam: Pt has thick disfigured discolored nails with subungual debris noted bilateral entire nail hallux through fifth toenails Ulcer Exam: There is no evidence of ulcer or pre-ulcerative changes or infection. Orthopedic Exam: Muscle tone and strength are WNL. No limitations in general ROM. No crepitus or effusions noted. HAV  B/L.  Hammer toes 2-5  B/L. Skin: No Porokeratosis. No infection or ulcers  Assessment & Plan:  Patient was evaluated and treated and all questions answered.  Bilateral semiflexible hammertoe contractures. -I explained to the patient the etiology of hammertoe contractures and various treatment options were extensively discussed.  I discussed with the patient that wider shoe gear modification tends to help the most take the pressure off of the contracture of the toes.  She can also padded and protected as well.  Patient states understanding will work on that.  Onychomycosis with pain  -Nails palliatively debrided as below. -Educated on self-care  Procedure: Nail  Debridement Rationale: pain  Type of Debridement: manual, sharp debridement. Instrumentation: Nail nipper, rotary burr. Number of Nails: 10  Procedures and Treatment: Consent by patient was obtained for treatment procedures. The patient understood the discussion of treatment and procedures well. All questions were answered thoroughly reviewed. Debridement of mycotic and hypertrophic toenails, 1 through 5 bilateral and clearing of subungual debris. No ulceration, no infection noted.  Return Visit-Office Procedure: Patient instructed to return to the office for a follow up visit 3 months for continued evaluation and treatment.  Boneta Lucks, DPM    No follow-ups on file.

## 2020-01-10 ENCOUNTER — Ambulatory Visit: Payer: Medicare Other | Admitting: Cardiology

## 2020-02-02 ENCOUNTER — Ambulatory Visit: Payer: Medicare Other | Admitting: Cardiology

## 2020-02-02 ENCOUNTER — Other Ambulatory Visit: Payer: Self-pay

## 2020-02-02 ENCOUNTER — Encounter: Payer: Self-pay | Admitting: Cardiology

## 2020-02-02 VITALS — BP 131/80 | HR 91 | Resp 18 | Ht 63.0 in | Wt 103.0 lb

## 2020-02-02 DIAGNOSIS — R54 Age-related physical debility: Secondary | ICD-10-CM

## 2020-02-02 DIAGNOSIS — N1831 Chronic kidney disease, stage 3a: Secondary | ICD-10-CM

## 2020-02-02 DIAGNOSIS — I480 Paroxysmal atrial fibrillation: Secondary | ICD-10-CM

## 2020-02-02 DIAGNOSIS — Z66 Do not resuscitate: Secondary | ICD-10-CM

## 2020-02-02 DIAGNOSIS — I10 Essential (primary) hypertension: Secondary | ICD-10-CM

## 2020-02-02 NOTE — Progress Notes (Signed)
Primary Physician:  Gaynelle Arabian, MD   Patient ID: [name], female    DOB: [birth year], 86 y.o.   MRN: [id number]  Subjective:    Chief Complaint  Patient presents with  . Atrial Fibrillation  . Hypertension  . Follow-up    2 month    HPI: [name]  is a 86 y.o. female  with History of B-cell lymphoma without detectable signs of recurrence, history of CKD stage 3, and pancytopenia, history of essential tremors, essential hypertension, GERD, orthostatic hypotension, venous insufficiency, paroxysmal atrial fibrillation, and degenerative joint disease. She is on amiodarone for management of her A. fib.   She denies any bleeding diathesis with anticoagulation. She has not had any falls. Walks with walker. Has 24 hour care at home. Her daughter is close by and is present in the office today.  Past Medical History:  Diagnosis Date  . Allergy   . Cancer ([name])    follicular lymphoma  . Chronic atrial fibrillation ([place]) 11/18/2017  . Chronic kidney disease   . COPD (chronic obstructive pulmonary disease) ([place])   . Depression   . DJD (degenerative joint disease)   . Dysuria 09/25/2015  . Essential tremor   . GERD (gastroesophageal reflux disease)   . Heart murmur    had echo 20 yr ago-not sure where  . Hx of colonic polyp 10/02/05  . Hypertension   . MGUS (monoclonal gammopathy of unknown significance) 10/24/2015  . MVP (mitral valve prolapse)   . Orthostatic hypotension   . Osteoporosis   . Paroxysmal atrial fibrillation (HCC)   . Venous insufficiency     Past Surgical History:  Procedure Laterality Date  . APPENDECTOMY    . CATARACT EXTRACTION Bilateral   . COLONOSCOPY    . DILATION AND CURETTAGE OF UTERUS     several  . OVARIAN CYST REMOVAL    . SKIN BIOPSY Right 03/09/2019   FACE AND LEG   Social History   Tobacco Use  . Smoking status: Never Smoker  . Smokeless tobacco: Never Used  Substance Use Topics  . Alcohol use: No    Alcohol/week: 0.0  standard drinks   Marital Status: Widowed   Review of Systems  Cardiovascular: Positive for leg swelling. Negative for chest pain and dyspnea on exertion.  Gastrointestinal: Negative for melena.  Neurological: Positive for dizziness, tremors and weakness.   Objective:  Blood pressure 131/80, pulse 91, resp. rate 18, height 5\' 3"  (1.6 m), weight 103 lb (46.7 kg), SpO2 95 %. Body mass index is 18.25 kg/m.  Vitals with BMI 02/02/2020 11/10/2019 07/24/2019  Height 5\' 3"  5\' 3"  -  Weight 103 lbs 103 lbs -  BMI 24.40 10.27 -  Systolic 253 664 403  Diastolic 80 80 86  Pulse 91 52 78      Physical Exam Vitals reviewed.  Constitutional:      Appearance: She is well-developed.  Cardiovascular:     Rate and Rhythm: Normal rate and regular rhythm.  Extrasystoles are present.    Pulses: Intact distal pulses.          Dorsalis pedis pulses are 2+ on the right side and 2+ on the left side.       Posterior tibial pulses are 2+ on the right side and 2+ on the left side.     Heart sounds: Normal heart sounds.     Comments: Bilateral non-pitting edema Pulmonary:     Effort: Pulmonary effort is normal. No accessory muscle  usage or respiratory distress.     Breath sounds: Normal breath sounds.  Abdominal:     General: Bowel sounds are normal.     Palpations: Abdomen is soft.    Radiology: No results found.  Laboratory examination:   CMP Latest Ref Rng & Units 07/24/2019 07/22/2019 07/21/2019  Glucose 70 - 99 mg/dL 92 100(H) 109(H)  BUN 8 - 23 mg/dL 26(H) 39(H) 40(H)  Creatinine 0.44 - 1.00 mg/dL 0.83 0.91 1.00  Sodium 135 - 145 mmol/L 136 137 136  Potassium 3.5 - 5.1 mmol/L 3.9 4.1 3.2(L)  Chloride 98 - 111 mmol/L 103 104 99  CO2 22 - 32 mmol/L 26 24 24   Calcium 8.9 - 10.3 mg/dL 8.8(L) 8.3(L) 8.2(L)  Total Protein 6.5 - 8.1 g/dL 5.5(L) 5.9(L) 6.3(L)  Total Bilirubin 0.3 - 1.2 mg/dL 0.7 1.0 1.5(H)  Alkaline Phos 38 - 126 U/L 59 46 50  AST 15 - 41 U/L 31 24 19   ALT 0 - 44 U/L 23 17 12     CBC Latest Ref Rng & Units 07/24/2019 07/22/2019 07/21/2019  WBC 4.0 - 10.5 K/uL 3.4(L) 7.2 5.7  Hemoglobin 12.0 - 15.0 g/dL 9.2(L) 9.8(L) 10.1(L)  Hematocrit 36 - 46 % 28.9(L) 29.5(L) 30.7(L)  Platelets 150 - 400 K/uL 164 236 229   Lipid Panel     Component Value Date/Time   CHOL 169 01/30/2017 0718   TRIG 109 01/30/2017 0718   HDL 61 01/30/2017 0718   CHOLHDL 2.8 01/30/2017 0718   VLDL 22 01/30/2017 0718   LDLCALC 86 01/30/2017 0718   HEMOGLOBIN A1C No results found for: HGBA1C, MPG TSH Recent Labs    07/19/19 1409  TSH 0.638   External labs:  TSH 0.638 07/19/2019  PRN Meds:. Medications Discontinued During This Encounter  Medication Reason  . cefdinir (OMNICEF) 300 MG capsule Completed Course  . clindamycin (CLEOCIN) 150 MG capsule Completed Course  . magnesium oxide (MAG-OX) 400 (241.3 Mg) MG tablet Patient Preference  . methylPREDNISolone acetate (DEPO-MEDROL) 40 MG/ML injection Patient Preference   Current Meds  Medication Sig  . acetaminophen (TYLENOL) 500 MG tablet Take 1-2 tablets (500-1,000 mg total) by mouth every 6 (six) hours as needed for moderate pain.  Marland Kitchen apixaban (ELIQUIS) 2.5 MG TABS tablet Take 1 tablet (2.5 mg total) by mouth 2 (two) times daily.  . benzonatate (TESSALON) 100 MG capsule Take 2 capsules (200 mg total) by mouth 3 (three) times daily as needed for cough.  . dextromethorphan-guaiFENesin (MUCINEX DM) 30-600 MG per 12 hr tablet Take 1 tablet by mouth daily as needed (sinus).   . donepezil (ARICEPT) 10 MG tablet Take 1 tablet (10 mg total) by mouth at bedtime.  . famotidine (PEPCID) 20 MG tablet Take 20 mg by mouth daily as needed for heartburn or indigestion.   . fluticasone (FLONASE) 50 MCG/ACT nasal spray Place 2 sprays into the nose daily as needed for allergies.   . hydrochlorothiazide (HYDRODIURIL) 25 MG tablet Take 25 mg by mouth daily.  . hyoscyamine (LEVSIN SL) 0.125 MG SL tablet Place 0.375 mg under the tongue 2 (two) times daily.    Marland Kitchen loperamide (IMODIUM A-D) 2 MG tablet Take 2 mg by mouth 4 (four) times daily as needed for diarrhea or loose stools.  Marland Kitchen LORazepam (ATIVAN) 0.5 MG tablet Take 0.25-0.5 mg by mouth 2 (two) times daily as needed.  Marland Kitchen losartan (COZAAR) 100 MG tablet Take 100 mg by mouth daily.  . meclizine (ANTIVERT) 50 MG tablet Take 0.5 tablets (25  mg total) by mouth 3 (three) times daily as needed. (Patient taking differently: Take 12.5 mg by mouth 3 (three) times daily as needed for dizziness. )  . propranolol ER (INDERAL LA) 120 MG 24 hr capsule Take 1 capsule (120 mg total) by mouth daily.  . sertraline (ZOLOFT) 100 MG tablet Take 100 mg by mouth daily.   . traZODone (DESYREL) 50 MG tablet Take 50 mg by mouth at bedtime.     Cardiac Studies:   Echo 01/23/2017: Normal LV size with EF 60-65%. Moderate diastolic dysfunction.Normal RV size and systolic function. Moderate to severe biatrial enlargement. Mild MR.   EKG:  EKG 11/10/2019: Marked sinus bradycardia at rate of 53 bpm with first-degree AV block, left atrial enlargement, left axis deviation, left anterior fascicular block.  Incomplete right bundle branch block.  LVH.  Poor R wave progression, cannot exclude anteroseptal infarct old. No significant change from EKG 03/12/2019, no change in HR.   Assessment:     ICD-10-CM   1. Paroxysmal atrial fibrillation (HCC)  I48.0   2. Essential hypertension  I10   3. Stage 3a chronic kidney disease  N18.31   4. DNR (do not resuscitate)  Z66   5. Frailty syndrome in geriatric patient  R54    Recommendations:   [name]  is a 86 y.o. Caucasian female  with History of B-cell lymphoma without detectable signs of recurrence, history of CKD stage 3, and pancytopenia, history of essential tremors, essential hypertension, GERD, orthostatic hypotension, venous insufficiency, paroxysmal atrial fibrillation, and degenerative joint disease. She is on amiodarone for management of her A. Fib.  Patient is here  for 28-month follow-up visit. Overall, patient has remained stable from a cardiac standpoint. She has not had any syncope, heart racing, or shortness of breath.  Leg edema remains stable with daily leg wrapping.  She is presently on reduced dose of amiodarone at 100 mg daily which had reduced her last office visit and heart rate is still well controlled.  I discussed the patient's long-term prognosis, in view of her frail nature and advanced age, ACP was done after discussing with her daughter [name].  On her last office visit I had reduced the dose of amiodarone from 200 mg to 100 mg daily, she is also on Inderal ER  120 mg twice daily, in view of advanced age, I reduced to 120 ER daily, in spite of this heart rate remains well controlled.  No significant change in her tremors.  Otherwise she remains stable from cardiac standpoint without evidence of heart failure, she is not anemic and patient and daughter advised to follow-up with the PCP for further management of anemia.  I will see her back on a as needed basis and certainly available for any emergencies.  DNR forms are filled and MOST form was given to the patient.   Adrian Prows, MD, [facility] 02/03/2020, 11:30 PM Office: [phone]

## 2020-03-06 ENCOUNTER — Other Ambulatory Visit: Payer: Self-pay

## 2020-03-07 ENCOUNTER — Telehealth: Payer: Self-pay | Admitting: Neurology

## 2020-03-07 NOTE — Telephone Encounter (Signed)
Spoke with patients daughter; she was upset that we did not refill her mom's Aricept rx. Advised her that that patient needs to schedule a follow up appt. She requested that her mom be seen virtual because its hard for her to get in and out of the car. Advised her that I would speak with Dr Tat and if she is agreeable with a virtual appt then the schedulers will contact her to set up an appointment. She voiced understanding.

## 2020-03-07 NOTE — Telephone Encounter (Signed)
Patient's daughter Stanton Kidney called in wondering why the patient's donepezil was not refilled?

## 2020-03-07 NOTE — Telephone Encounter (Signed)
As long as she gets an appt scheduled, you can feel free to refill.  Her notes indicate she just needs a yearly f/u and last seen 06/2019 so as long as seen by 06/2020 you can refill.  Just put on VV schedule as well by then

## 2020-03-08 MED ORDER — DONEPEZIL HCL 10 MG PO TABS: 10.0000 mg | ORAL_TABLET | Freq: Every day | ORAL | 1 refills | Status: DC

## 2020-03-08 NOTE — Telephone Encounter (Signed)
Rx(s) sent to pharmacy electronically.  

## 2020-03-08 NOTE — Telephone Encounter (Signed)
Pt is sch for 07-05-20 with Tat and would like her Medication Donepezil called in to the [facility] on [address]

## 2020-03-23 IMAGING — DX DG CHEST 2V
2 series · 2 of 2 positions shown · non-contrast
Comparison: 02/11/2017

CLINICAL DATA: Right chest wall pain

EXAM:
CHEST - 2 VIEW

[chest lat]
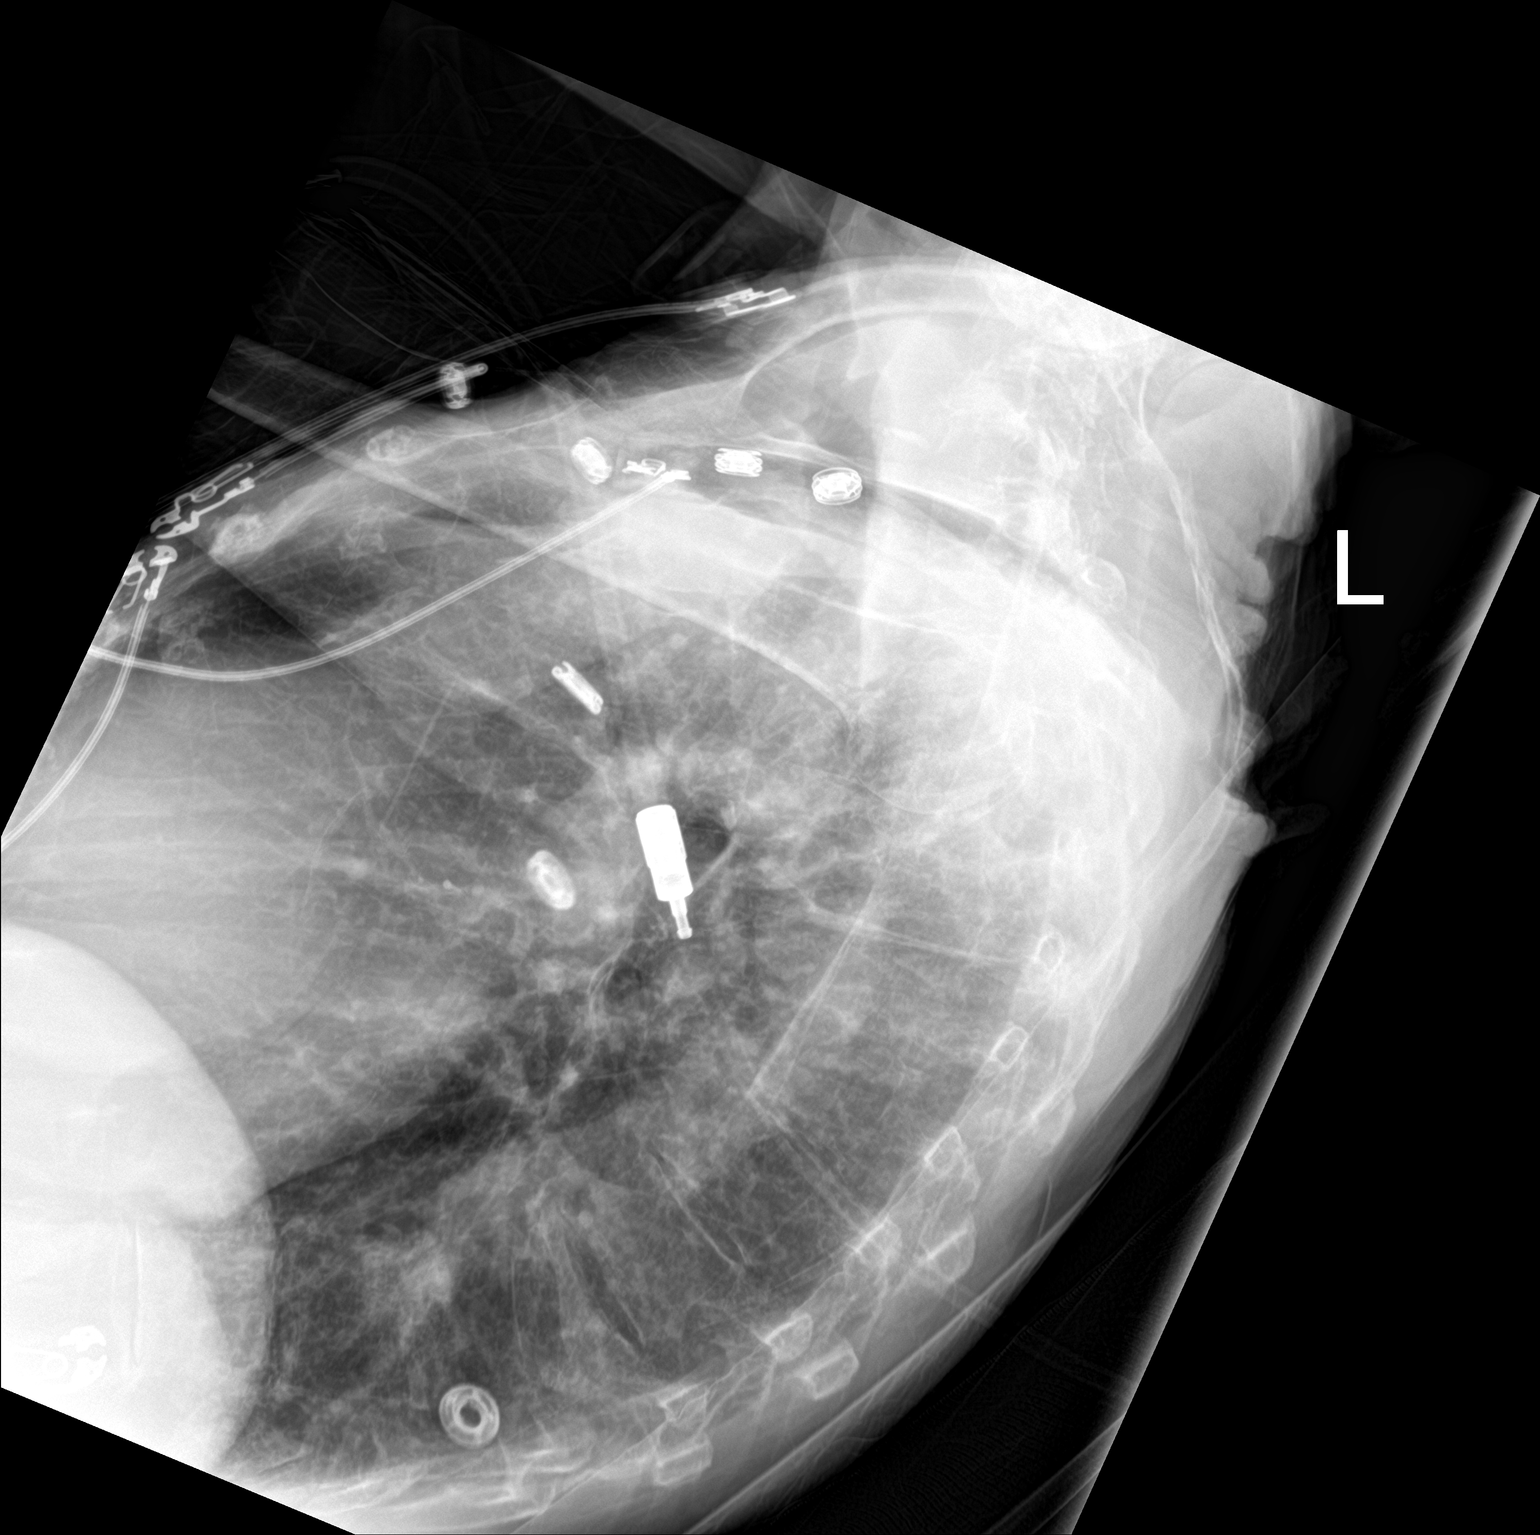

[chest ap]
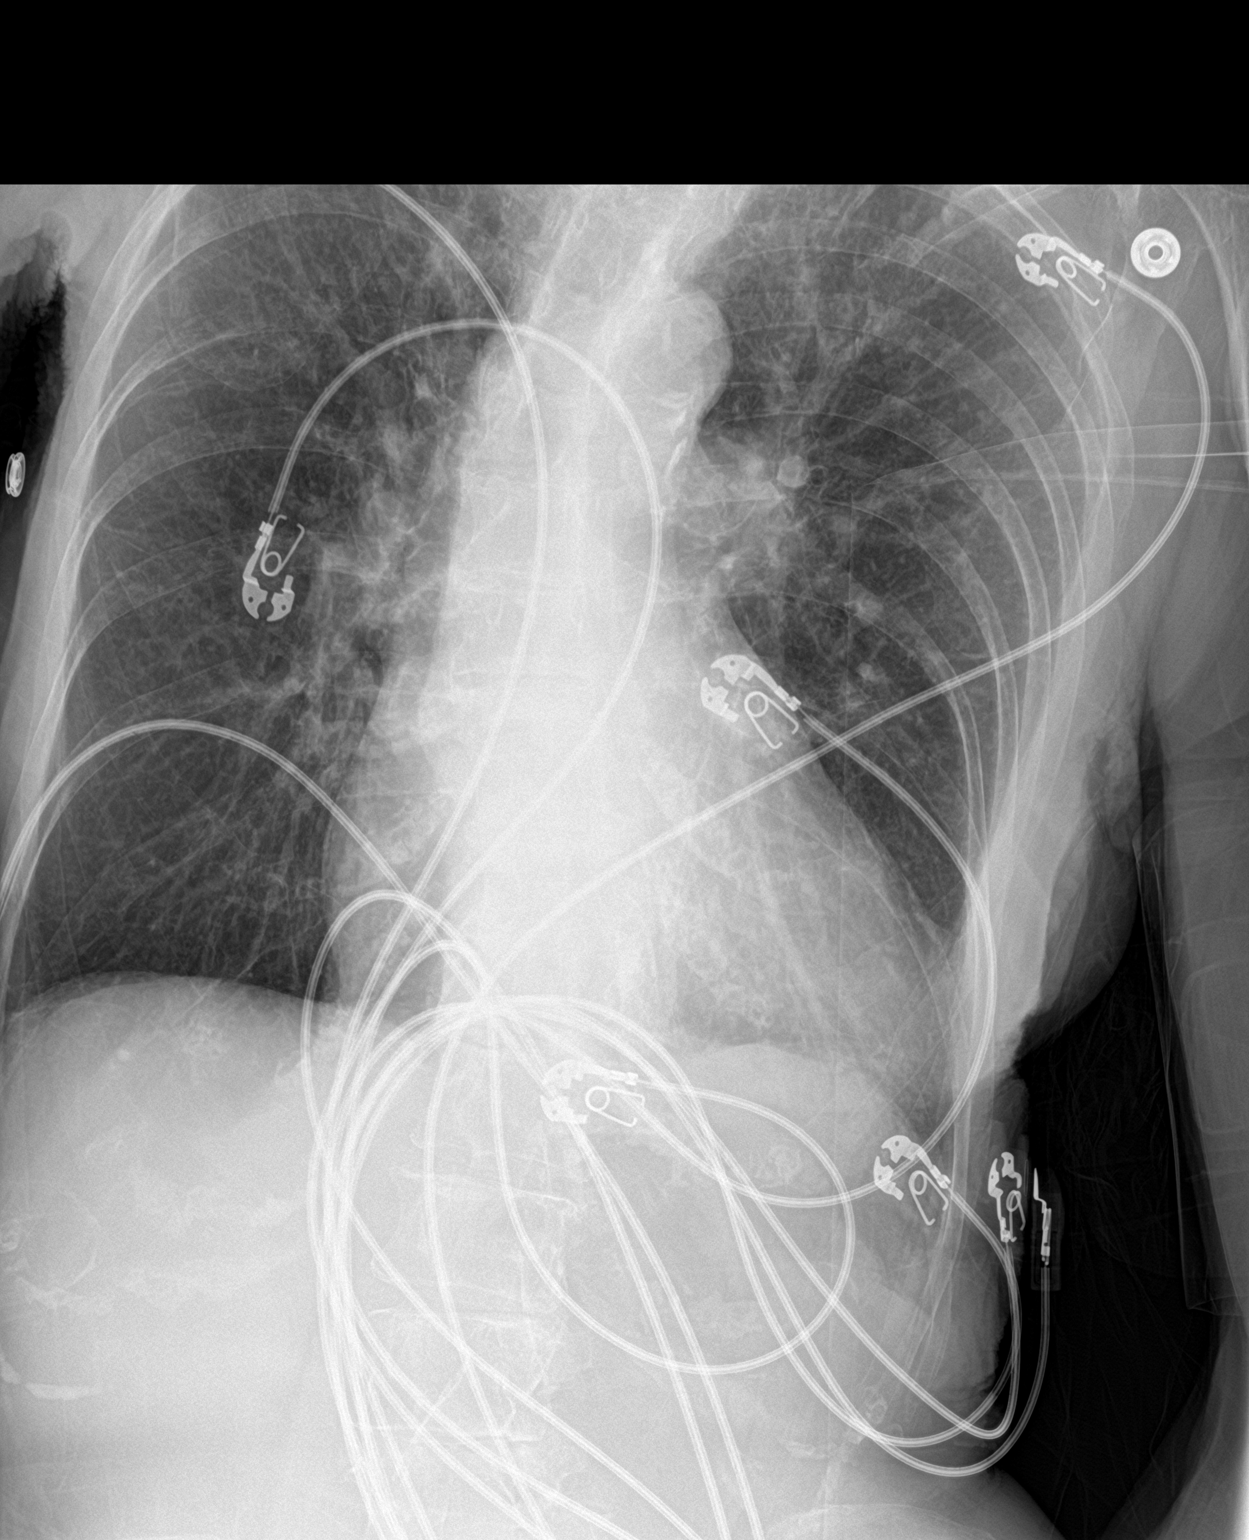

[2 of 2 positions shown; findings below may reference images not displayed]

FINDINGS: Cardiomegaly. There is hyperinflation of the lungs compatible with
COPD. No confluent opacities, effusions or edema. No acute bony
abnormality.
IMPRESSION: Cardiomegaly, COPD.  No active disease.

## 2020-03-23 IMAGING — DX DG HUMERUS 2V *R*
2 series · 2 of 2 positions shown · non-contrast
Comparison: Right elbow performed today.

CLINICAL DATA: Fall, right elbow pain

EXAM:
RIGHT HUMERUS - 2+ VIEW

[humerus ap]
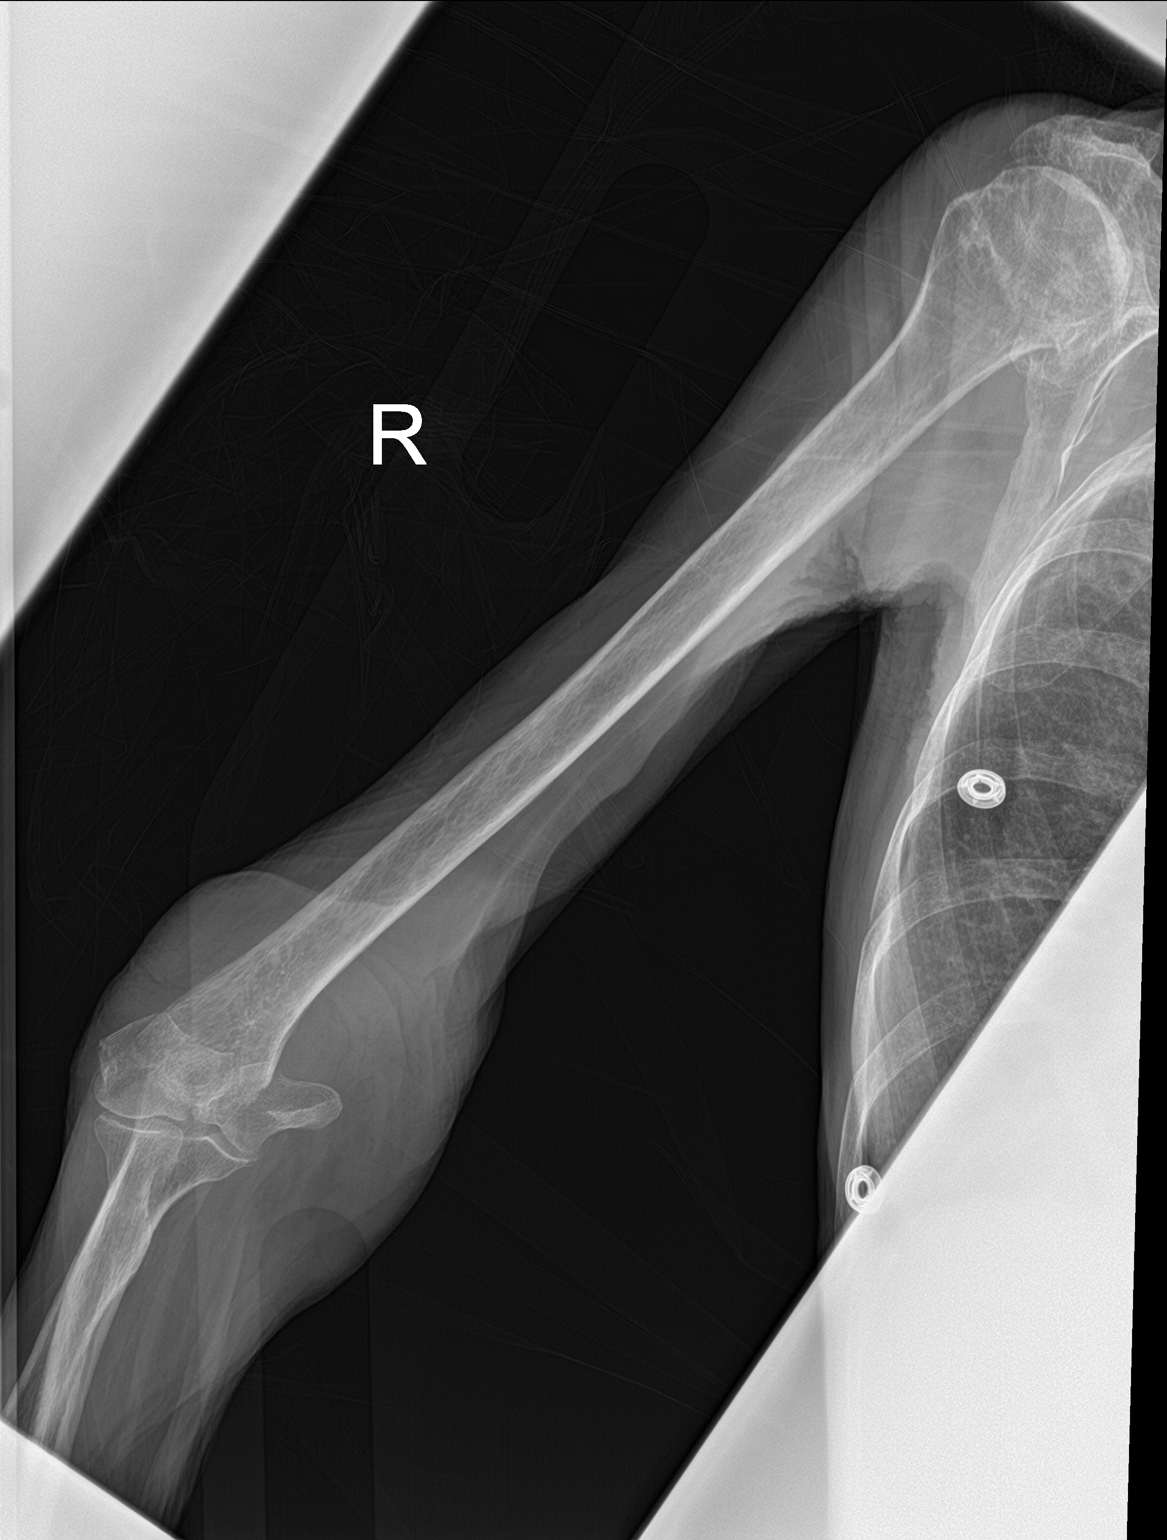

[humerus lat]
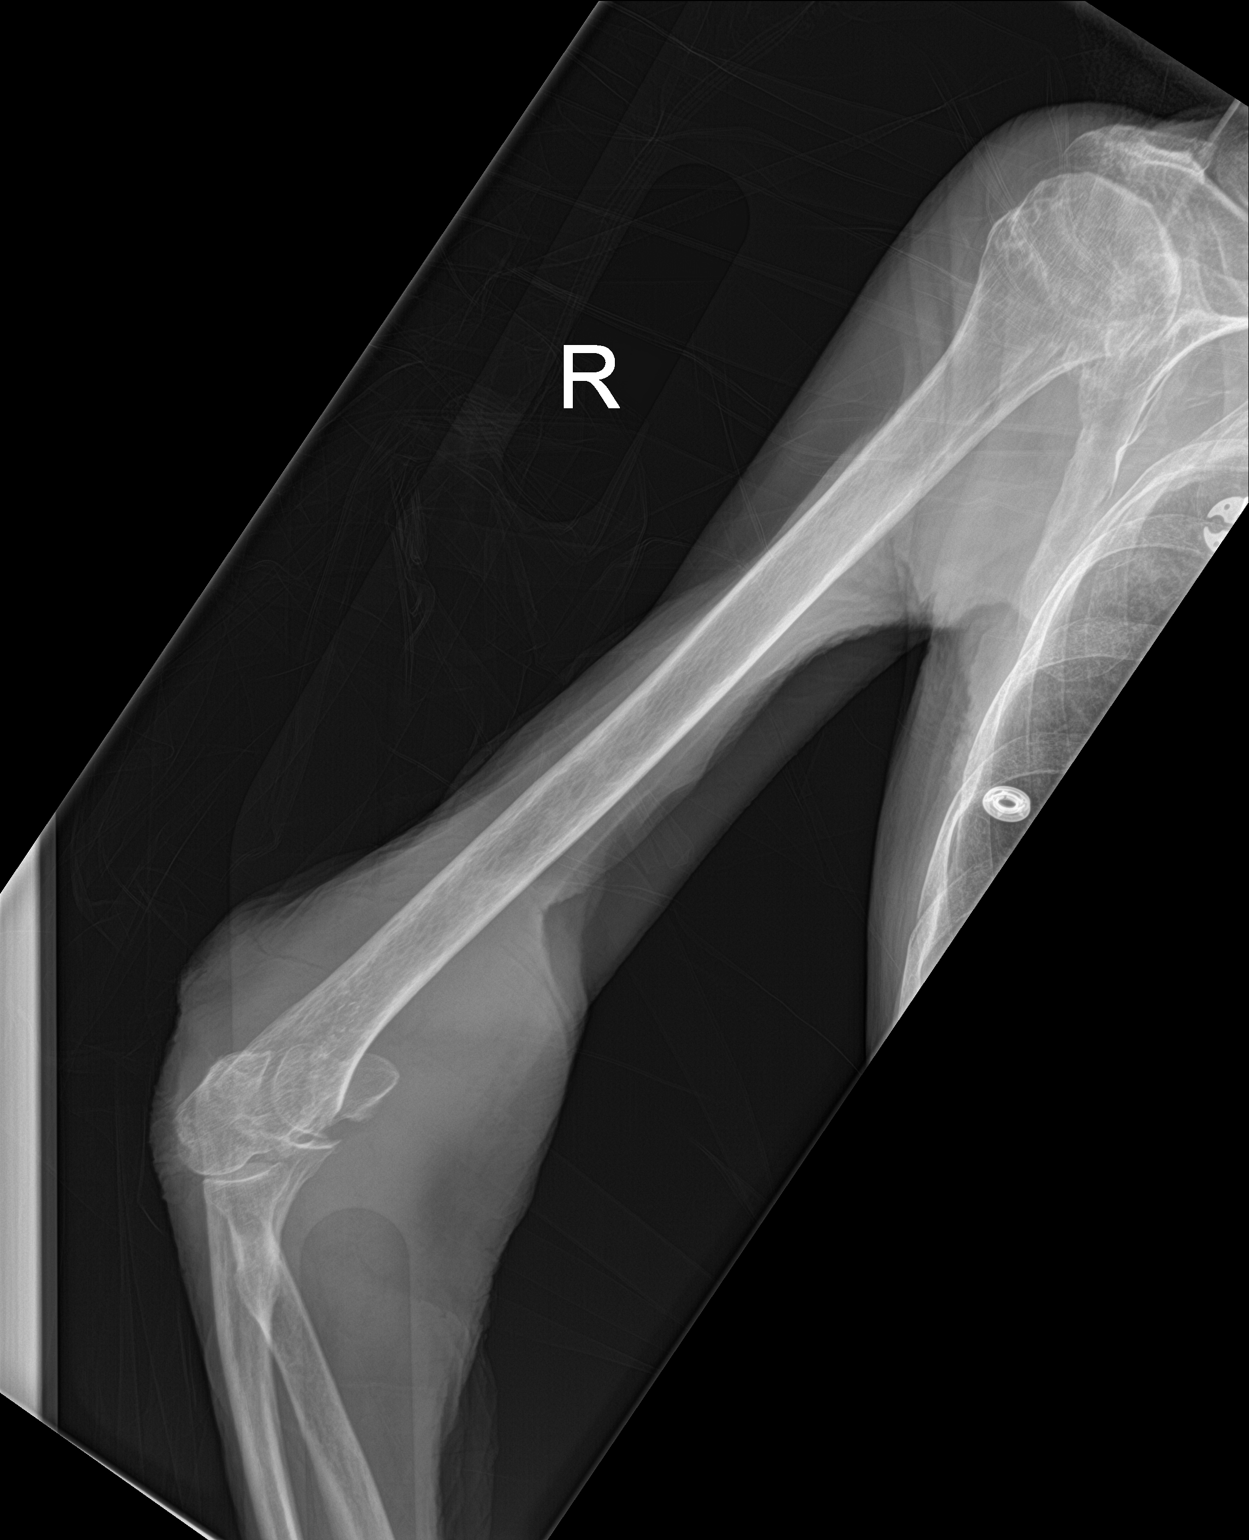

[2 of 2 positions shown; findings below may reference images not displayed]

FINDINGS: Deformity in the distal right humerus compatible with comminuted,
displaced fracture. No visible subluxation or dislocation.
IMPRESSION: Comminuted, displaced distal right humeral fracture.

## 2020-03-23 IMAGING — DX DG ELBOW COMPLETE 3+V*R*
4 series · 4 of 4 positions shown · non-contrast
Comparison: None.

CLINICAL DATA: Fall.  Right elbow swelling and pain.

EXAM:
RIGHT ELBOW - COMPLETE 3+ VIEW

[elbow ap]
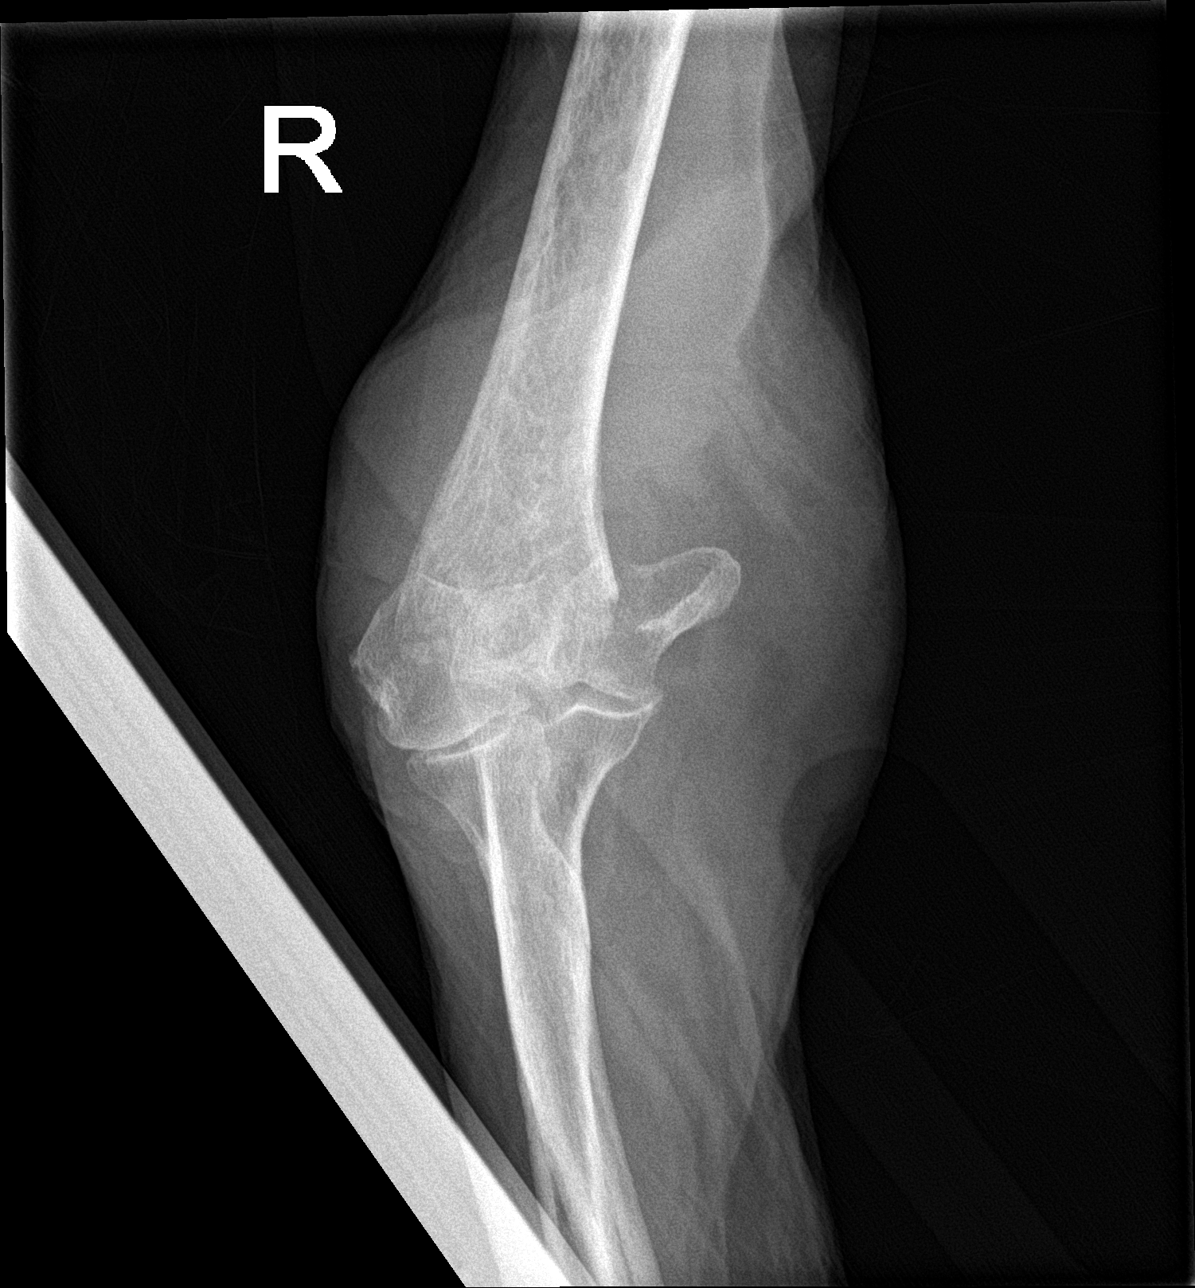

[elbow obl (1 of 2)]
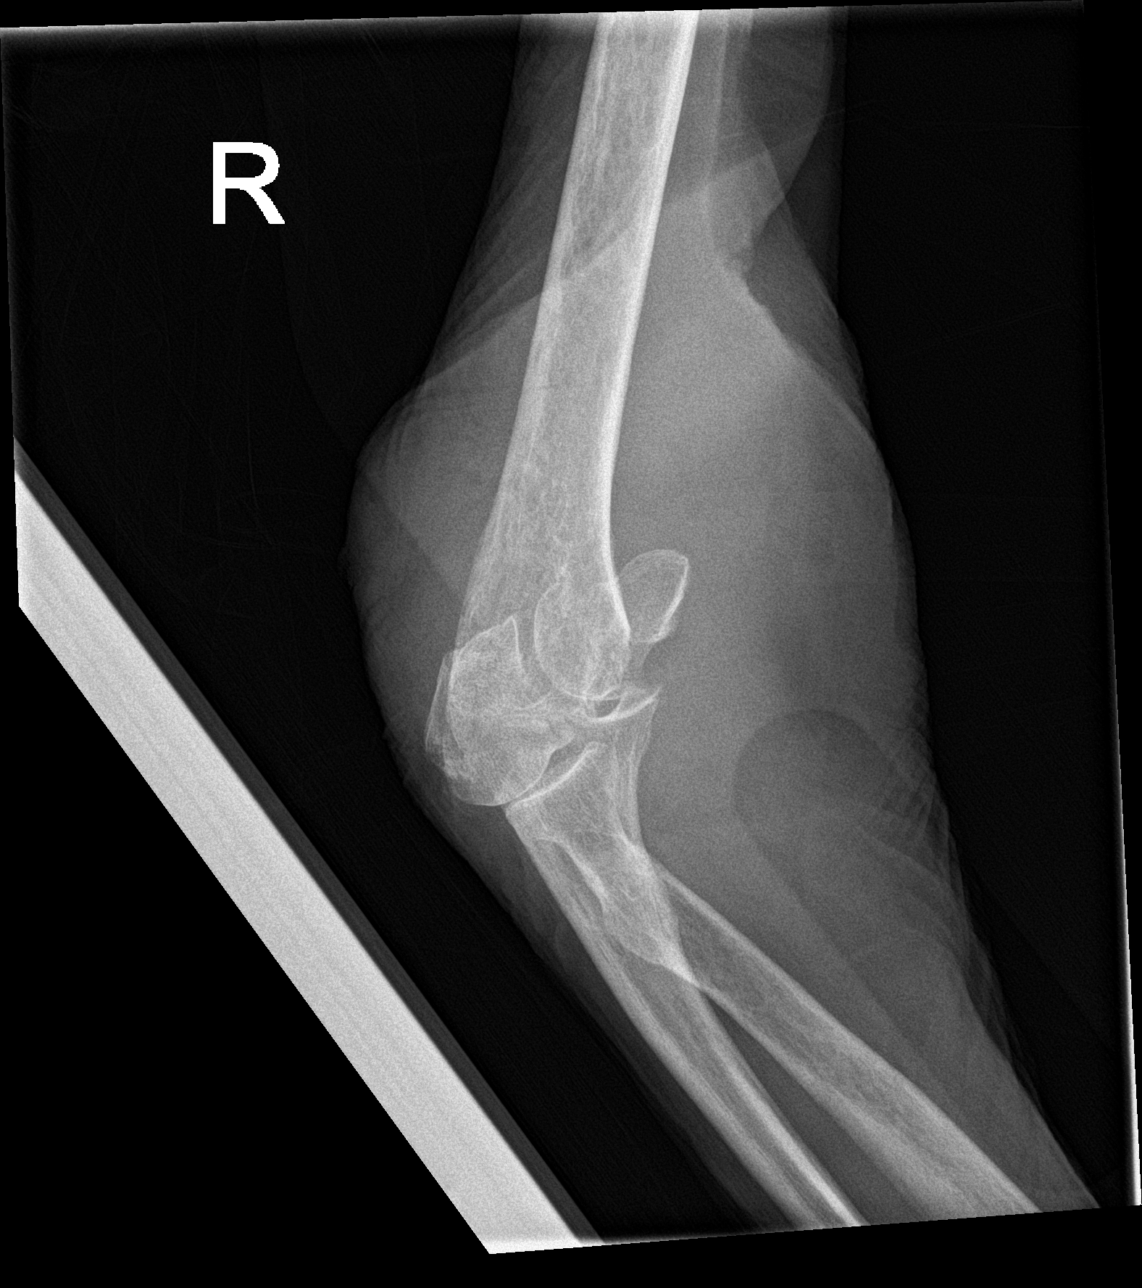

[elbow obl (2 of 2)]
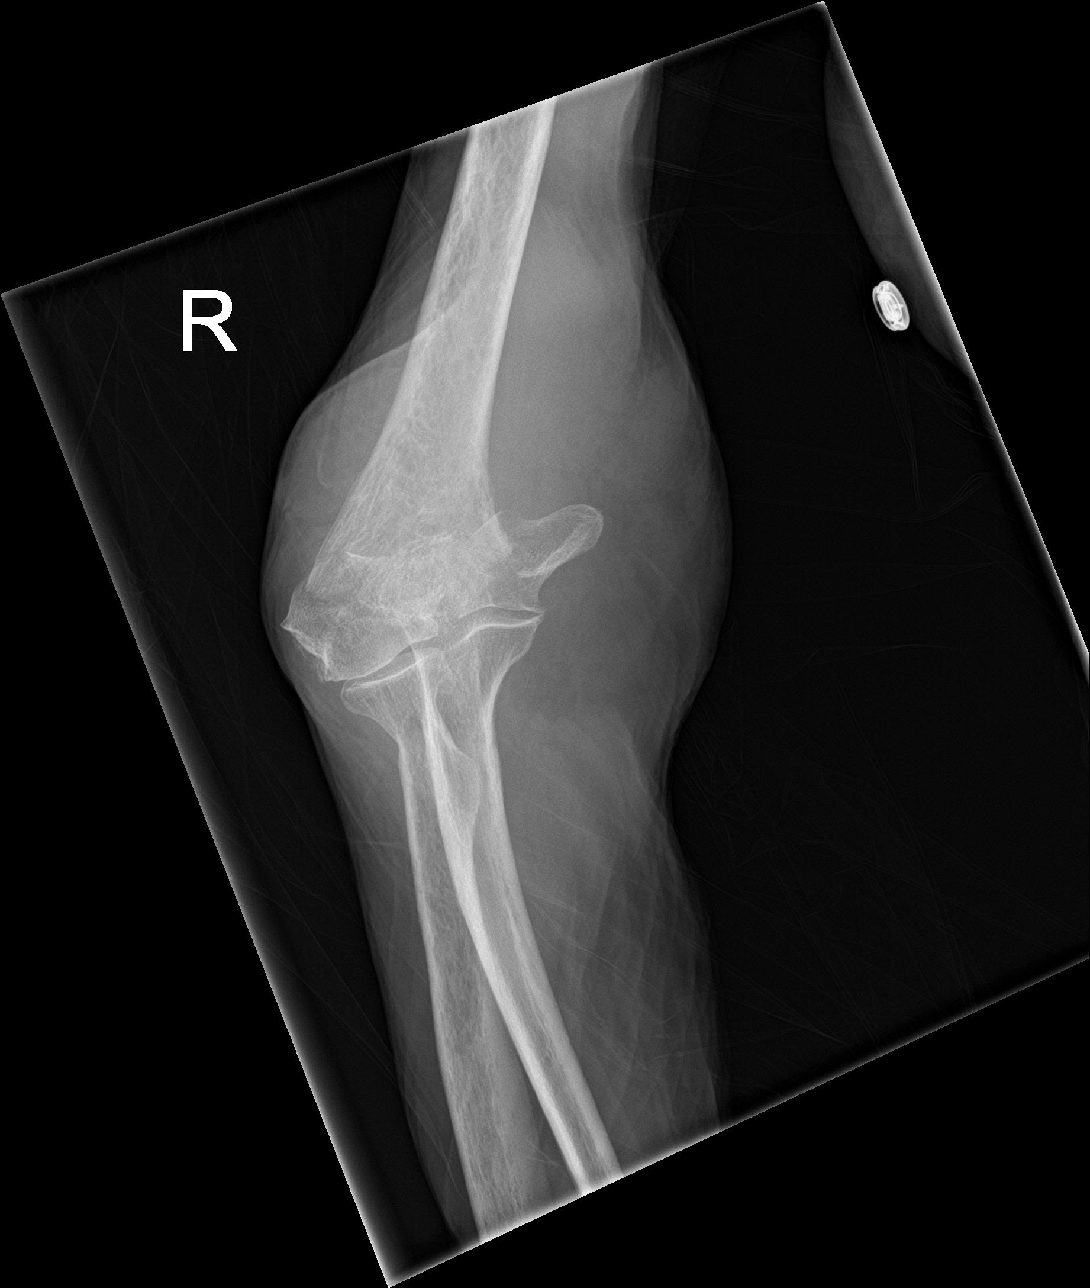

[elbow lat]
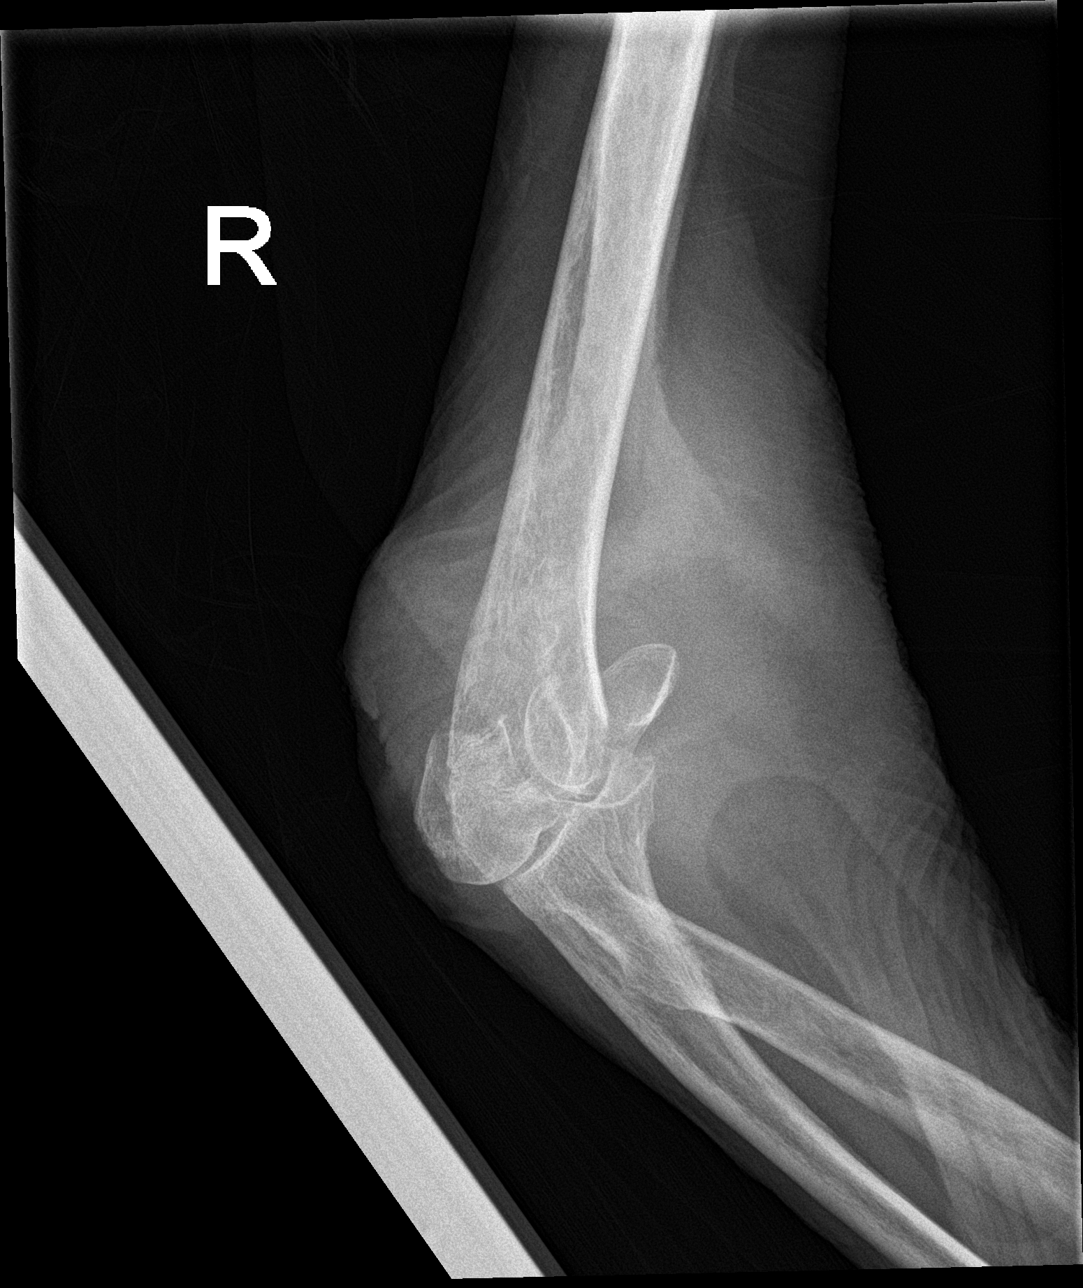

[4 of 4 positions shown; findings below may reference images not displayed]

FINDINGS: There is deformity in the distal right humerus compatible with
distal humeral fracture. Fracture fragments are moderately
displaced. No subluxation or dislocation.
IMPRESSION: Displaced, comminuted distal right humeral fracture.

## 2020-04-05 ENCOUNTER — Ambulatory Visit: Payer: Medicare Other | Admitting: Podiatry

## 2020-06-02 ENCOUNTER — Other Ambulatory Visit: Payer: Self-pay | Admitting: Cardiology

## 2020-06-02 DIAGNOSIS — I480 Paroxysmal atrial fibrillation: Secondary | ICD-10-CM

## 2020-07-03 NOTE — Progress Notes (Signed)
Virtual Visit Via Video/changed to phone as neither patient nor I could connect via video   The purpose of this virtual visit is to provide medical care while limiting exposure to the novel coronavirus.    Consent was obtained for video turned audio visit:  Yes.   Answered questions that patient had about telehealth interaction:  Yes.   I discussed the limitations, risks, security and privacy concerns of performing an evaluation and management service by telemedicine. I also discussed with the patient that there may be a patient responsible charge related to this service. The patient expressed understanding and agreed to proceed.  Pt location: Home Physician Location: office Name of referring provider:  Gaynelle Arabian, MD I connected with [name] at patients initiation/request on 07/05/2020 at  8:15 AM EST by telephone application and verified that I am speaking with the correct person using two identifiers. Pt MRN:  [id number] Pt DOB:  1926-04-10 Video Participants:  [name];  Daughter supplements hx (mary johnson); 2 caregivers  Assessment/Plan:   1.  Probable Alzheimer's dementia  -Declines neurocognitive testing.  Has not been seen in person in quite some time.  They have chosen video visits  -Continue Aricept, 10 mg daily.  -Patient with 24 hour/day caregiving.  -having some hallucinations but not bothersome enough to patient/family to require medication  2.  Essential tremor  -Worsened when placed on amiodarone.  -Did have success with primidone, but had to be discontinued when placed on Eliquis.  -On propranolol.  3.  F/u 1 year  Subjective   Patient seen today in follow-up for Alzheimer's dementia.  Daughter present today and supplements history.  Patient has 24 hour/day care. Sometimes pt is agitated, described as "grumpy" but doesn't hit. She has visual hallucinations - "they come and check on me."  Not scared by these and not acting out in a neg manner.   Had a UTI few weeks ago and behavior worse then but resolved once UTI tx.  she had be Ambulates with her walker.  Follows with cardiology.  Saw Dr. Einar Gip in person in July.  Current movement d/o meds:  Donepezil, 10 mg daily.   Current Outpatient Medications on File Prior to Visit  Medication Sig Dispense Refill  . acetaminophen (TYLENOL) 500 MG tablet Take 1-2 tablets (500-1,000 mg total) by mouth every 6 (six) hours as needed for moderate pain. 30 tablet 0  . amiodarone (PACERONE) 200 MG tablet TAKE 1/2 TABLET BY MOUTH EVERY OTHER DAY 30 tablet 1  . apixaban (ELIQUIS) 2.5 MG TABS tablet Take 1 tablet (2.5 mg total) by mouth 2 (two) times daily. 60 tablet 2  . dextromethorphan-guaiFENesin (MUCINEX DM) 30-600 MG per 12 hr tablet Take 1 tablet by mouth daily as needed (sinus).     . famotidine (PEPCID) 20 MG tablet Take 20 mg by mouth daily as needed for heartburn or indigestion.     . fluticasone (FLONASE) 50 MCG/ACT nasal spray Place 2 sprays into the nose daily as needed for allergies.     . hydrochlorothiazide (HYDRODIURIL) 25 MG tablet Take 25 mg by mouth daily.    . hyoscyamine (LEVBID) 0.375 MG 12 hr tablet Take 0.375 mg by mouth every 12 (twelve) hours as needed.    . loperamide (IMODIUM A-D) 2 MG tablet Take 2 mg by mouth 4 (four) times daily as needed for diarrhea or loose stools.    Marland Kitchen LORazepam (ATIVAN) 0.5 MG tablet Take 0.25-0.5 mg by mouth 2 (two) times  daily as needed.    Marland Kitchen losartan (COZAAR) 100 MG tablet Take 100 mg by mouth daily.    . meclizine (ANTIVERT) 50 MG tablet Take 0.5 tablets (25 mg total) by mouth 3 (three) times daily as needed. (Patient taking differently: Take 12.5 mg by mouth 3 (three) times daily as needed for dizziness.) 30 tablet 0  . propranolol ER (INDERAL LA) 120 MG 24 hr capsule Take 1 capsule (120 mg total) by mouth daily. 180 capsule 3  . sertraline (ZOLOFT) 100 MG tablet Take 100 mg by mouth daily.     . traZODone (DESYREL) 50 MG tablet Take 50 mg by  mouth at bedtime. Take half to whole tablet once a day     No current facility-administered medications on file prior to visit.     Objective   Vitals:   07/05/20 0800  BP: 109/79  Pulse: 96  Temp: 97.6 F (36.4 C)  TempSrc: Oral  SpO2: 93%  Weight: 110 lb (49.9 kg)  Height: 5\' 3"  (1.6 m)   Speech fluent and clear    Follow up Instructions      -I discussed the assessment and treatment plan with the patient. The patient was provided an opportunity to ask questions and all were answered. The patient agreed with the plan and demonstrated an understanding of the instructions.   The patient was advised to call back or seek an in-person evaluation if the symptoms worsen or if the condition fails to improve as anticipated.    Total time spent on today's visit was 64minutes, including both face-to-face time and nonface-to-face time.  Time included that spent on review of records (prior notes available to me/labs/imaging if pertinent), discussing treatment and goals, answering patient's questions and coordinating care.   Alonza Bogus, DO

## 2020-07-05 ENCOUNTER — Telehealth (INDEPENDENT_AMBULATORY_CARE_PROVIDER_SITE_OTHER): Payer: Medicare Other | Admitting: Neurology

## 2020-07-05 ENCOUNTER — Encounter: Payer: Self-pay | Admitting: Neurology

## 2020-07-05 ENCOUNTER — Other Ambulatory Visit: Payer: Self-pay

## 2020-07-05 VITALS — BP 109/79 | HR 96 | Temp 97.6°F | Ht 63.0 in | Wt 110.0 lb

## 2020-07-05 DIAGNOSIS — G301 Alzheimer's disease with late onset: Secondary | ICD-10-CM

## 2020-07-05 DIAGNOSIS — R441 Visual hallucinations: Secondary | ICD-10-CM

## 2020-07-05 DIAGNOSIS — F0280 Dementia in other diseases classified elsewhere without behavioral disturbance: Secondary | ICD-10-CM | POA: Diagnosis not present

## 2020-07-05 MED ORDER — DONEPEZIL HCL 10 MG PO TABS
10.0000 mg | ORAL_TABLET | Freq: Every day | ORAL | 2 refills | Status: AC
Start: 2020-07-05 — End: ?

## 2020-07-09 ENCOUNTER — Other Ambulatory Visit: Payer: Self-pay | Admitting: Cardiology

## 2020-07-09 DIAGNOSIS — I10 Essential (primary) hypertension: Secondary | ICD-10-CM

## 2020-07-09 DIAGNOSIS — I480 Paroxysmal atrial fibrillation: Secondary | ICD-10-CM

## 2020-09-06 ENCOUNTER — Ambulatory Visit: Payer: Medicare Other | Admitting: Podiatry

## 2020-09-06 ENCOUNTER — Encounter: Payer: Self-pay | Admitting: Podiatry

## 2020-09-06 ENCOUNTER — Other Ambulatory Visit: Payer: Self-pay

## 2020-09-06 DIAGNOSIS — M79675 Pain in left toe(s): Secondary | ICD-10-CM | POA: Diagnosis not present

## 2020-09-06 DIAGNOSIS — D689 Coagulation defect, unspecified: Secondary | ICD-10-CM | POA: Diagnosis not present

## 2020-09-06 DIAGNOSIS — M79674 Pain in right toe(s): Secondary | ICD-10-CM

## 2020-09-06 DIAGNOSIS — B351 Tinea unguium: Secondary | ICD-10-CM

## 2020-09-06 NOTE — Progress Notes (Signed)
  Subjective:  Patient ID: [name], female    DOB: 08/14/1925,  MRN: [id number]  Chief Complaint  Patient presents with  . routine foot care    Nail trim    87 y.o. female returns for the above complaint.  Patient presents with thickened elongated mycotic toenails x10.  Patient says it is painful to walk on.  She has not been able to treat them as they are getting very thick and unable to be debrided.  Patient would like to know if she can get them debrided here today.  Patient is currently on Eliquis.  She denies any other acute complaints.  Objective:  There were no vitals filed for this visit. Podiatric Exam: Vascular: dorsalis pedis and posterior tibial pulses are palpable bilateral. Capillary return is immediate. Temperature gradient is WNL. Skin turgor WNL  Sensorium: Normal Semmes Weinstein monofilament test. Normal tactile sensation bilaterally. Nail Exam: Pt has thick disfigured discolored nails with subungual debris noted bilateral entire nail hallux through fifth toenails Ulcer Exam: There is no evidence of ulcer or pre-ulcerative changes or infection. Orthopedic Exam: Muscle tone and strength are WNL. No limitations in general ROM. No crepitus or effusions noted. HAV  B/L.  Hammer toes 2-5  B/L. Skin: No Porokeratosis. No infection or ulcers  Assessment & Plan:  Patient was evaluated and treated and all questions answered.  Bilateral semiflexible hammertoe contractures. -I explained to the patient the etiology of hammertoe contractures and various treatment options were extensively discussed.  I discussed with the patient that wider shoe gear modification tends to help the most take the pressure off of the contracture of the toes.  She can also padded and protected as well.  Patient states understanding will work on that.  Onychomycosis with pain  -Nails palliatively debrided as below. -Educated on self-care  Procedure: Nail Debridement Rationale: pain  Type of  Debridement: manual, sharp debridement. Instrumentation: Nail nipper, rotary burr. Number of Nails: 10  Procedures and Treatment: Consent by patient was obtained for treatment procedures. The patient understood the discussion of treatment and procedures well. All questions were answered thoroughly reviewed. Debridement of mycotic and hypertrophic toenails, 1 through 5 bilateral and clearing of subungual debris. No ulceration, no infection noted.  Return Visit-Office Procedure: Patient instructed to return to the office for a follow up visit 3 months for continued evaluation and treatment.  Boneta Lucks, DPM    No follow-ups on file.

## 2020-09-27 DIAGNOSIS — R54 Age-related physical debility: Secondary | ICD-10-CM | POA: Diagnosis not present

## 2020-09-27 DIAGNOSIS — I700 Atherosclerosis of aorta: Secondary | ICD-10-CM | POA: Diagnosis not present

## 2020-09-27 DIAGNOSIS — E46 Unspecified protein-calorie malnutrition: Secondary | ICD-10-CM | POA: Diagnosis not present

## 2020-09-27 DIAGNOSIS — I129 Hypertensive chronic kidney disease with stage 1 through stage 4 chronic kidney disease, or unspecified chronic kidney disease: Secondary | ICD-10-CM | POA: Diagnosis not present

## 2020-09-27 DIAGNOSIS — G250 Essential tremor: Secondary | ICD-10-CM | POA: Diagnosis not present

## 2020-09-27 DIAGNOSIS — G309 Alzheimer's disease, unspecified: Secondary | ICD-10-CM | POA: Diagnosis not present

## 2020-09-27 DIAGNOSIS — N183 Chronic kidney disease, stage 3 unspecified: Secondary | ICD-10-CM | POA: Diagnosis not present

## 2020-09-27 DIAGNOSIS — I4891 Unspecified atrial fibrillation: Secondary | ICD-10-CM | POA: Diagnosis not present

## 2020-09-27 DIAGNOSIS — J449 Chronic obstructive pulmonary disease, unspecified: Secondary | ICD-10-CM | POA: Diagnosis not present

## 2020-10-10 DIAGNOSIS — L821 Other seborrheic keratosis: Secondary | ICD-10-CM | POA: Diagnosis not present

## 2020-10-10 DIAGNOSIS — D692 Other nonthrombocytopenic purpura: Secondary | ICD-10-CM | POA: Diagnosis not present

## 2020-10-10 DIAGNOSIS — Z85828 Personal history of other malignant neoplasm of skin: Secondary | ICD-10-CM | POA: Diagnosis not present

## 2020-10-10 DIAGNOSIS — L570 Actinic keratosis: Secondary | ICD-10-CM | POA: Diagnosis not present

## 2020-10-18 DIAGNOSIS — K219 Gastro-esophageal reflux disease without esophagitis: Secondary | ICD-10-CM | POA: Diagnosis not present

## 2020-10-18 DIAGNOSIS — R251 Tremor, unspecified: Secondary | ICD-10-CM | POA: Diagnosis not present

## 2020-10-18 DIAGNOSIS — I10 Essential (primary) hypertension: Secondary | ICD-10-CM | POA: Diagnosis not present

## 2020-10-18 DIAGNOSIS — N183 Chronic kidney disease, stage 3 unspecified: Secondary | ICD-10-CM | POA: Diagnosis not present

## 2020-10-18 DIAGNOSIS — I4891 Unspecified atrial fibrillation: Secondary | ICD-10-CM | POA: Diagnosis not present

## 2020-10-25 DIAGNOSIS — R251 Tremor, unspecified: Secondary | ICD-10-CM | POA: Diagnosis not present

## 2020-10-25 DIAGNOSIS — M6281 Muscle weakness (generalized): Secondary | ICD-10-CM | POA: Diagnosis not present

## 2020-10-31 DIAGNOSIS — I129 Hypertensive chronic kidney disease with stage 1 through stage 4 chronic kidney disease, or unspecified chronic kidney disease: Secondary | ICD-10-CM | POA: Diagnosis not present

## 2020-10-31 DIAGNOSIS — I4891 Unspecified atrial fibrillation: Secondary | ICD-10-CM | POA: Diagnosis not present

## 2020-10-31 DIAGNOSIS — D472 Monoclonal gammopathy: Secondary | ICD-10-CM | POA: Diagnosis not present

## 2020-10-31 DIAGNOSIS — Z9181 History of falling: Secondary | ICD-10-CM | POA: Diagnosis not present

## 2020-10-31 DIAGNOSIS — H6122 Impacted cerumen, left ear: Secondary | ICD-10-CM | POA: Diagnosis not present

## 2020-10-31 DIAGNOSIS — G309 Alzheimer's disease, unspecified: Secondary | ICD-10-CM | POA: Diagnosis not present

## 2020-10-31 DIAGNOSIS — Z7901 Long term (current) use of anticoagulants: Secondary | ICD-10-CM | POA: Diagnosis not present

## 2020-10-31 DIAGNOSIS — K219 Gastro-esophageal reflux disease without esophagitis: Secondary | ICD-10-CM | POA: Diagnosis not present

## 2020-10-31 DIAGNOSIS — N1831 Chronic kidney disease, stage 3a: Secondary | ICD-10-CM | POA: Diagnosis not present

## 2020-11-08 DIAGNOSIS — I10 Essential (primary) hypertension: Secondary | ICD-10-CM | POA: Diagnosis not present

## 2020-11-08 DIAGNOSIS — I129 Hypertensive chronic kidney disease with stage 1 through stage 4 chronic kidney disease, or unspecified chronic kidney disease: Secondary | ICD-10-CM | POA: Diagnosis not present

## 2020-11-08 DIAGNOSIS — I4891 Unspecified atrial fibrillation: Secondary | ICD-10-CM | POA: Diagnosis not present

## 2020-11-08 DIAGNOSIS — Z9181 History of falling: Secondary | ICD-10-CM | POA: Diagnosis not present

## 2020-11-08 DIAGNOSIS — Z7901 Long term (current) use of anticoagulants: Secondary | ICD-10-CM | POA: Diagnosis not present

## 2020-11-08 DIAGNOSIS — K219 Gastro-esophageal reflux disease without esophagitis: Secondary | ICD-10-CM | POA: Diagnosis not present

## 2020-11-08 DIAGNOSIS — D472 Monoclonal gammopathy: Secondary | ICD-10-CM | POA: Diagnosis not present

## 2020-11-08 DIAGNOSIS — G309 Alzheimer's disease, unspecified: Secondary | ICD-10-CM | POA: Diagnosis not present

## 2020-11-08 DIAGNOSIS — N1831 Chronic kidney disease, stage 3a: Secondary | ICD-10-CM | POA: Diagnosis not present

## 2020-11-14 DIAGNOSIS — D472 Monoclonal gammopathy: Secondary | ICD-10-CM | POA: Diagnosis not present

## 2020-11-14 DIAGNOSIS — G309 Alzheimer's disease, unspecified: Secondary | ICD-10-CM | POA: Diagnosis not present

## 2020-11-14 DIAGNOSIS — N1831 Chronic kidney disease, stage 3a: Secondary | ICD-10-CM | POA: Diagnosis not present

## 2020-11-14 DIAGNOSIS — Z7901 Long term (current) use of anticoagulants: Secondary | ICD-10-CM | POA: Diagnosis not present

## 2020-11-14 DIAGNOSIS — Z9181 History of falling: Secondary | ICD-10-CM | POA: Diagnosis not present

## 2020-11-14 DIAGNOSIS — I129 Hypertensive chronic kidney disease with stage 1 through stage 4 chronic kidney disease, or unspecified chronic kidney disease: Secondary | ICD-10-CM | POA: Diagnosis not present

## 2020-11-14 DIAGNOSIS — K219 Gastro-esophageal reflux disease without esophagitis: Secondary | ICD-10-CM | POA: Diagnosis not present

## 2020-11-14 DIAGNOSIS — I4891 Unspecified atrial fibrillation: Secondary | ICD-10-CM | POA: Diagnosis not present

## 2020-11-20 DIAGNOSIS — N1831 Chronic kidney disease, stage 3a: Secondary | ICD-10-CM | POA: Diagnosis not present

## 2020-11-20 DIAGNOSIS — I4891 Unspecified atrial fibrillation: Secondary | ICD-10-CM | POA: Diagnosis not present

## 2020-11-20 DIAGNOSIS — D472 Monoclonal gammopathy: Secondary | ICD-10-CM | POA: Diagnosis not present

## 2020-11-20 DIAGNOSIS — K219 Gastro-esophageal reflux disease without esophagitis: Secondary | ICD-10-CM | POA: Diagnosis not present

## 2020-11-20 DIAGNOSIS — I129 Hypertensive chronic kidney disease with stage 1 through stage 4 chronic kidney disease, or unspecified chronic kidney disease: Secondary | ICD-10-CM | POA: Diagnosis not present

## 2020-11-20 DIAGNOSIS — Z7901 Long term (current) use of anticoagulants: Secondary | ICD-10-CM | POA: Diagnosis not present

## 2020-11-20 DIAGNOSIS — G309 Alzheimer's disease, unspecified: Secondary | ICD-10-CM | POA: Diagnosis not present

## 2020-11-20 DIAGNOSIS — Z9181 History of falling: Secondary | ICD-10-CM | POA: Diagnosis not present

## 2020-11-21 DIAGNOSIS — N1831 Chronic kidney disease, stage 3a: Secondary | ICD-10-CM | POA: Diagnosis not present

## 2020-11-21 DIAGNOSIS — G309 Alzheimer's disease, unspecified: Secondary | ICD-10-CM | POA: Diagnosis not present

## 2020-11-21 DIAGNOSIS — I4891 Unspecified atrial fibrillation: Secondary | ICD-10-CM | POA: Diagnosis not present

## 2020-11-21 DIAGNOSIS — I129 Hypertensive chronic kidney disease with stage 1 through stage 4 chronic kidney disease, or unspecified chronic kidney disease: Secondary | ICD-10-CM | POA: Diagnosis not present

## 2020-11-28 ENCOUNTER — Emergency Department (HOSPITAL_COMMUNITY): Payer: Medicare Other

## 2020-11-28 ENCOUNTER — Inpatient Hospital Stay (HOSPITAL_COMMUNITY)
Admission: EM | Admit: 2020-11-28 | Discharge: 2020-12-20 | DRG: 177 | Disposition: E | Payer: Medicare Other | Attending: Family Medicine | Admitting: Family Medicine

## 2020-11-28 ENCOUNTER — Encounter (HOSPITAL_COMMUNITY): Payer: Self-pay

## 2020-11-28 ENCOUNTER — Other Ambulatory Visit: Payer: Self-pay

## 2020-11-28 ENCOUNTER — Encounter (HOSPITAL_COMMUNITY): Payer: Self-pay | Admitting: Radiology

## 2020-11-28 DIAGNOSIS — E860 Dehydration: Secondary | ICD-10-CM | POA: Insufficient documentation

## 2020-11-28 DIAGNOSIS — J189 Pneumonia, unspecified organism: Secondary | ICD-10-CM | POA: Diagnosis present

## 2020-11-28 DIAGNOSIS — A419 Sepsis, unspecified organism: Secondary | ICD-10-CM | POA: Insufficient documentation

## 2020-11-28 DIAGNOSIS — J90 Pleural effusion, not elsewhere classified: Principal | ICD-10-CM

## 2020-11-28 DIAGNOSIS — N179 Acute kidney failure, unspecified: Secondary | ICD-10-CM | POA: Insufficient documentation

## 2020-11-28 DIAGNOSIS — W19XXXA Unspecified fall, initial encounter: Secondary | ICD-10-CM

## 2020-11-28 DIAGNOSIS — Z7901 Long term (current) use of anticoagulants: Secondary | ICD-10-CM

## 2020-11-28 DIAGNOSIS — G629 Polyneuropathy, unspecified: Secondary | ICD-10-CM | POA: Diagnosis present

## 2020-11-28 DIAGNOSIS — G319 Degenerative disease of nervous system, unspecified: Secondary | ICD-10-CM | POA: Diagnosis not present

## 2020-11-28 DIAGNOSIS — Z880 Allergy status to penicillin: Secondary | ICD-10-CM

## 2020-11-28 DIAGNOSIS — I482 Chronic atrial fibrillation, unspecified: Secondary | ICD-10-CM | POA: Diagnosis present

## 2020-11-28 DIAGNOSIS — I6523 Occlusion and stenosis of bilateral carotid arteries: Secondary | ICD-10-CM | POA: Diagnosis not present

## 2020-11-28 DIAGNOSIS — Z886 Allergy status to analgesic agent status: Secondary | ICD-10-CM

## 2020-11-28 DIAGNOSIS — Z882 Allergy status to sulfonamides status: Secondary | ICD-10-CM

## 2020-11-28 DIAGNOSIS — R911 Solitary pulmonary nodule: Secondary | ICD-10-CM | POA: Diagnosis present

## 2020-11-28 DIAGNOSIS — Z20822 Contact with and (suspected) exposure to covid-19: Secondary | ICD-10-CM | POA: Diagnosis not present

## 2020-11-28 DIAGNOSIS — S0990XA Unspecified injury of head, initial encounter: Secondary | ICD-10-CM | POA: Diagnosis not present

## 2020-11-28 DIAGNOSIS — R001 Bradycardia, unspecified: Secondary | ICD-10-CM | POA: Diagnosis present

## 2020-11-28 DIAGNOSIS — Z91040 Latex allergy status: Secondary | ICD-10-CM

## 2020-11-28 DIAGNOSIS — J9601 Acute respiratory failure with hypoxia: Secondary | ICD-10-CM | POA: Diagnosis present

## 2020-11-28 DIAGNOSIS — E872 Acidosis: Secondary | ICD-10-CM | POA: Diagnosis not present

## 2020-11-28 DIAGNOSIS — K219 Gastro-esophageal reflux disease without esophagitis: Secondary | ICD-10-CM | POA: Diagnosis not present

## 2020-11-28 DIAGNOSIS — G309 Alzheimer's disease, unspecified: Secondary | ICD-10-CM | POA: Diagnosis present

## 2020-11-28 DIAGNOSIS — I4891 Unspecified atrial fibrillation: Secondary | ICD-10-CM | POA: Diagnosis not present

## 2020-11-28 DIAGNOSIS — I739 Peripheral vascular disease, unspecified: Secondary | ICD-10-CM | POA: Diagnosis not present

## 2020-11-28 DIAGNOSIS — M47816 Spondylosis without myelopathy or radiculopathy, lumbar region: Secondary | ICD-10-CM | POA: Diagnosis not present

## 2020-11-28 DIAGNOSIS — D472 Monoclonal gammopathy: Secondary | ICD-10-CM | POA: Diagnosis present

## 2020-11-28 DIAGNOSIS — Z888 Allergy status to other drugs, medicaments and biological substances status: Secondary | ICD-10-CM

## 2020-11-28 DIAGNOSIS — J9811 Atelectasis: Secondary | ICD-10-CM | POA: Diagnosis not present

## 2020-11-28 DIAGNOSIS — D61818 Other pancytopenia: Secondary | ICD-10-CM | POA: Diagnosis not present

## 2020-11-28 DIAGNOSIS — Z743 Need for continuous supervision: Secondary | ICD-10-CM | POA: Diagnosis not present

## 2020-11-28 DIAGNOSIS — D7589 Other specified diseases of blood and blood-forming organs: Secondary | ICD-10-CM | POA: Diagnosis present

## 2020-11-28 DIAGNOSIS — G250 Essential tremor: Secondary | ICD-10-CM | POA: Diagnosis present

## 2020-11-28 DIAGNOSIS — I129 Hypertensive chronic kidney disease with stage 1 through stage 4 chronic kidney disease, or unspecified chronic kidney disease: Secondary | ICD-10-CM | POA: Diagnosis not present

## 2020-11-28 DIAGNOSIS — R404 Transient alteration of awareness: Secondary | ICD-10-CM | POA: Diagnosis not present

## 2020-11-28 DIAGNOSIS — M533 Sacrococcygeal disorders, not elsewhere classified: Secondary | ICD-10-CM | POA: Diagnosis not present

## 2020-11-28 DIAGNOSIS — M160 Bilateral primary osteoarthritis of hip: Secondary | ICD-10-CM | POA: Diagnosis not present

## 2020-11-28 DIAGNOSIS — I700 Atherosclerosis of aorta: Secondary | ICD-10-CM | POA: Diagnosis not present

## 2020-11-28 DIAGNOSIS — M47812 Spondylosis without myelopathy or radiculopathy, cervical region: Secondary | ICD-10-CM | POA: Diagnosis not present

## 2020-11-28 DIAGNOSIS — I517 Cardiomegaly: Secondary | ICD-10-CM | POA: Diagnosis not present

## 2020-11-28 DIAGNOSIS — W010XXA Fall on same level from slipping, tripping and stumbling without subsequent striking against object, initial encounter: Secondary | ICD-10-CM | POA: Diagnosis present

## 2020-11-28 DIAGNOSIS — I878 Other specified disorders of veins: Secondary | ICD-10-CM | POA: Diagnosis not present

## 2020-11-28 DIAGNOSIS — I959 Hypotension, unspecified: Secondary | ICD-10-CM | POA: Diagnosis present

## 2020-11-28 DIAGNOSIS — J690 Pneumonitis due to inhalation of food and vomit: Secondary | ICD-10-CM | POA: Diagnosis not present

## 2020-11-28 DIAGNOSIS — R590 Localized enlarged lymph nodes: Secondary | ICD-10-CM | POA: Diagnosis present

## 2020-11-28 DIAGNOSIS — Z881 Allergy status to other antibiotic agents status: Secondary | ICD-10-CM

## 2020-11-28 DIAGNOSIS — Z66 Do not resuscitate: Secondary | ICD-10-CM | POA: Diagnosis present

## 2020-11-28 DIAGNOSIS — S199XXA Unspecified injury of neck, initial encounter: Secondary | ICD-10-CM | POA: Diagnosis not present

## 2020-11-28 DIAGNOSIS — Z8572 Personal history of non-Hodgkin lymphomas: Secondary | ICD-10-CM

## 2020-11-28 DIAGNOSIS — R0902 Hypoxemia: Secondary | ICD-10-CM | POA: Diagnosis not present

## 2020-11-28 DIAGNOSIS — N183 Chronic kidney disease, stage 3 unspecified: Secondary | ICD-10-CM | POA: Diagnosis not present

## 2020-11-28 DIAGNOSIS — J3489 Other specified disorders of nose and nasal sinuses: Secondary | ICD-10-CM | POA: Diagnosis not present

## 2020-11-28 DIAGNOSIS — F0280 Dementia in other diseases classified elsewhere without behavioral disturbance: Secondary | ICD-10-CM | POA: Diagnosis present

## 2020-11-28 DIAGNOSIS — Z91010 Allergy to peanuts: Secondary | ICD-10-CM

## 2020-11-28 DIAGNOSIS — Z91018 Allergy to other foods: Secondary | ICD-10-CM

## 2020-11-28 LAB — RESP PANEL BY RT-PCR (FLU A&B, COVID) ARPGX2
Influenza A by PCR: NEGATIVE
Influenza B by PCR: NEGATIVE
SARS Coronavirus 2 by RT PCR: NEGATIVE

## 2020-11-28 LAB — COMPREHENSIVE METABOLIC PANEL
ALT: 71 U/L — ABNORMAL HIGH (ref 0–44)
AST: 97 U/L — ABNORMAL HIGH (ref 15–41)
Albumin: 3.4 g/dL — ABNORMAL LOW (ref 3.5–5.0)
Alkaline Phosphatase: 60 U/L (ref 38–126)
Anion gap: 15 (ref 5–15)
BUN: 24 mg/dL — ABNORMAL HIGH (ref 8–23)
CO2: 19 mmol/L — ABNORMAL LOW (ref 22–32)
Calcium: 9 mg/dL (ref 8.9–10.3)
Chloride: 103 mmol/L (ref 98–111)
Creatinine, Ser: 1.54 mg/dL — ABNORMAL HIGH (ref 0.44–1.00)
GFR, Estimated: 31 mL/min — ABNORMAL LOW (ref >60)
Glucose, Bld: 137 mg/dL — ABNORMAL HIGH (ref 70–99)
Potassium: 4.1 mmol/L (ref 3.5–5.1)
Sodium: 137 mmol/L (ref 135–145)
Total Bilirubin: 1.5 mg/dL — ABNORMAL HIGH (ref 0.3–1.2)
Total Protein: 6.5 g/dL (ref 6.5–8.1)

## 2020-11-28 LAB — PROTIME-INR
INR: 1.3 — ABNORMAL HIGH (ref 0.8–1.2)
Prothrombin Time: 16.5 seconds — ABNORMAL HIGH (ref 11.4–15.2)

## 2020-11-28 LAB — I-STAT CHEM 8, ED
BUN: 28 mg/dL — ABNORMAL HIGH (ref 8–23)
Calcium, Ion: 1.13 mmol/L — ABNORMAL LOW (ref 1.15–1.40)
Chloride: 104 mmol/L (ref 98–111)
Creatinine, Ser: 1.5 mg/dL — ABNORMAL HIGH (ref 0.44–1.00)
Glucose, Bld: 133 mg/dL — ABNORMAL HIGH (ref 70–99)
HCT: 37 % (ref 36.0–46.0)
Hemoglobin: 12.6 g/dL (ref 12.0–15.0)
Potassium: 4.1 mmol/L (ref 3.5–5.1)
Sodium: 139 mmol/L (ref 135–145)
TCO2: 21 mmol/L — ABNORMAL LOW (ref 22–32)

## 2020-11-28 LAB — SAMPLE TO BLOOD BANK

## 2020-11-28 LAB — CBC
HCT: 39.3 % (ref 36.0–46.0)
Hemoglobin: 12.2 g/dL (ref 12.0–15.0)
MCH: 33.8 pg (ref 26.0–34.0)
MCHC: 31 g/dL (ref 30.0–36.0)
MCV: 108.9 fL — ABNORMAL HIGH (ref 80.0–100.0)
Platelets: 130 10*3/uL — ABNORMAL LOW (ref 150–400)
RBC: 3.61 MIL/uL — ABNORMAL LOW (ref 3.87–5.11)
RDW: 14.6 % (ref 11.5–15.5)
WBC: 6.4 10*3/uL (ref 4.0–10.5)
nRBC: 0.3 % — ABNORMAL HIGH (ref 0.0–0.2)

## 2020-11-28 LAB — LACTIC ACID, PLASMA
Lactic Acid, Venous: 8.8 mmol/L (ref 0.5–1.9)
Lactic Acid, Venous: 5.6 mmol/L (ref 0.5–1.9)

## 2020-11-28 LAB — CK: Total CK: 36 U/L — ABNORMAL LOW (ref 38–234)

## 2020-11-28 MED ORDER — DONEPEZIL HCL 10 MG PO TABS
10.0000 mg | ORAL_TABLET | Freq: Every day | ORAL | Status: DC
  Filled 2020-11-28: qty 1

## 2020-11-28 MED ORDER — SODIUM CHLORIDE 0.9 % IV SOLN: 1.0000 g | INTRAVENOUS | Status: DC

## 2020-11-28 MED ORDER — AMIODARONE HCL 200 MG PO TABS: 200.0000 mg | ORAL_TABLET | Freq: Every day | ORAL | Status: DC

## 2020-11-28 MED ORDER — PROPRANOLOL HCL 40 MG PO TABS: 40.0000 mg | ORAL_TABLET | Freq: Three times a day (TID) | ORAL | Status: DC

## 2020-11-28 MED ORDER — SODIUM CHLORIDE 0.9 % IV BOLUS: 500.0000 mL | Freq: Once | INTRAVENOUS | Status: DC

## 2020-11-28 MED ORDER — SODIUM CHLORIDE 0.9 % IV BOLUS: 1000.0000 mL | Freq: Once | INTRAVENOUS | Status: DC

## 2020-11-28 MED ORDER — SODIUM CHLORIDE 0.9 % IV BOLUS
250.0000 mL | Freq: Once | INTRAVENOUS | Status: AC
  Administered 2020-11-28: 250 mL via INTRAVENOUS

## 2020-11-28 MED ORDER — APIXABAN 2.5 MG PO TABS
2.5000 mg | ORAL_TABLET | Freq: Two times a day (BID) | ORAL | Status: DC
  Filled 2020-11-28: qty 1

## 2020-11-28 MED ORDER — SERTRALINE HCL 50 MG PO TABS
50.0000 mg | ORAL_TABLET | Freq: Every day | ORAL | Status: DC
  Filled 2020-11-28: qty 1

## 2020-11-28 MED ORDER — IOHEXOL 300 MG/ML  SOLN
50.0000 mL | Freq: Once | INTRAMUSCULAR | Status: AC | PRN
  Administered 2020-11-28: 50 mL via INTRAVENOUS

## 2020-11-28 MED ORDER — SODIUM CHLORIDE 0.9 % IV SOLN: 500.0000 mg | INTRAVENOUS | Status: DC

## 2020-11-29 ENCOUNTER — Encounter: Payer: Self-pay | Admitting: Podiatry

## 2020-12-06 ENCOUNTER — Ambulatory Visit: Payer: Medicare Other | Admitting: Podiatry

## 2020-12-20 NOTE — ED Notes (Signed)
Report received by previous Social research officer, government.   Spoke with admitting provider Dr. Nita Sells. Per MD pt is not an ME case. Family will be able to see patient and patient can be transported to [facility]. Pt prepared for viewing. Lines removed.

## 2020-12-20 NOTE — ED Notes (Signed)
RN with patient drawing bloodwork, spoke with patient about easiest way to administer her oral medications. This RN stepped out of the room to send lab work down and retrieve medications. Monitor showing asystole, RN immediately in to check patient, patient unresponsive to sternal rub at this time, this RN immediately to EDP to make aware, pt with DNR at bedside, CPR not initiated. EDP to bedside, patient continues to show asystole on monitor. EDP calls time of death @ 1442.

## 2020-12-20 NOTE — ED Triage Notes (Signed)
Patient BIB [facility] from [facility]. Complaint of fall, striking head. No obvious injury. Pt on eliquis. VSS.

## 2020-12-20 NOTE — H&P (Addendum)
[name] Admission History and Physical Service Pager: [phone]  Patient name: [name] Medical record number: [id number] Date of birth: 1926/03/15 Age: 87 y.o. Gender: female  Primary Care Provider: No primary care provider on file. Consultants: None Code Status: DNR Health Care POA: [place] [phone]  Chief Complaint: Hypoxia, falll  Assessment and Plan: [name] is a 87 y.o. female presenting after fall at nursing facility and found to have hypoxia and lactic acidosis. PMH is significant for Chronic a-fib on Eliquis and Amiodoarone and Proprnaolol, HTN, COPD, GERD, essential tremor, dementia, peripheral neuropathy, DJD, CKD III, h/o lung nodule, h/o B-cell lymphoma/MGUS  Hypoxia with lactic acidosis likely 2/2 Aspiration PNA  Questionable history of COPD: On presentation to ED, patient noted to be hypoxic requiring 4L O2 via [place] to maintain O2 sats in low 90's with lactic acidosis of 8.8. Currently on 6L O2. Does have questionable history of COPD, without smoking history, not on chronic inhalers. Room air at baseline. CT chest notable for small bilateral pleural effusions and heterogeneous and ground-glass airspace opacity of right lower lobe, nonspecific and infectious or inflammatory, favor aspiration given distribution. COVID/Flu negative. Does have history of viral gastroenteritis with vomiting in recent past which may be cause. Also exposed to window unit in facility placing higher risk for legionella. Except for mild tachypnea in mid 20's, no leukocytosis, fever, or tachycardia - does not meet SIRS criteria. Some concern that patient may not be mounting normal immune response in setting of possible recurrence of lymphoma. No suspicion for alternative infectious source at this time based on history and physical exam. Suspect some of the lactic acidosis may be from dehydration given recent reported losses. Per chart review has "allergy"  to PCN but has received CTX in 07/5174 without complication (see alternative chart). - Admit to [facility]PT[facility], attending Dr. Andria Frames - CTX 1g q24 hours and Azithromycin 500mg  q24 hours - legionella and blood culture prior to antibiotics - NS Bolus 1053mL over 4 hours - PM team to reassess hydration status and can give more if needed - Repeat lactic acid now then q3 hours till normal - NPO sips with meds - SLP for swallow study - PT/OT eval and treat  Fall: Sustained a fall at nursing facility with reported head trauma without of LOC. Walks with walker at baseline. CT head and cervical spine negative for acute intracranial process, notable for chronic degenerative changes to cervical spine. Pelvic XR negative for acute trauma. CT chest/abdomen/pelvis negative for acute traumatic injury but does note chronic high grade wedge deformities of T8-9. CK and electrolytes normal. Patient denies pain. Evidence of chronic small superficial laceration on left forearm with surrounding ecchymosis, otherwise normal exam. - PT/OT eval and treat  AKI on CKD III: Cr 1.5 on admission. BL appears to be 0.8-1.1, however has fluctuated up to 1.6 in past.  - fluid resuscitation as above - avoid nephrogenic agents - BMP in AM to monitor   Elevated LFT's and Total Bili: Mild elevation, AST 97, ALT 71, Total Bili 1.5. Recovering from recent gastroenteritis and diarrhea. CT abdomen/pelvis with contrast with normal pancreas, hepatobiliary normal. - Fluids - AM CMP  A-fib on chronic anticoagulation: Chronic. HR 70-80's. Rate controlled. Follows with Dr. Einar Gip at [facility]. Last appt 02/02/20. Current medications include Amiodarone 100mg  QD, Atenolol 50mg  QD, Propranolol ER 120mg  QD and Eliquis 2.5mg  BID. Chronic venous stasis edema which is stable.  - continue home meds - TSH - Propranolol IR  40mg  TID  - Hold Atenolol and ER Propranolol  Chronic HTN: Normotensive. Home meds: Losartan 100mg  QD and HCTZ  25mg  QD - hold home meds given AKI and normotensive BP's  GERD: Chronic. - Pepcid 20mg  QD  Alzheimer's Dementia  Essential Tremor  Chronic. Alzheimer's dementia with history of visual hallucinations. Follows with [facility], last appt 07/05/20. Current receives 24 hour care at [facility].Home meds include Donepezil 10mg  QD, Zoloft 100mg  QD, Trazodone 25-50mg  QHS PRN, Lorazepam 0.25mg  BID PRN  - continue Donepezil 10mg  QD - decrease Zoloft to 50mg  QD to avoid withdrawal and improve polypharmacy - recommend discontinuing Lorazepam given age and polypharamacy - can add on Trazodone for sleep if needed. Recommend trying Melatonin first.  Macrocytosis: Hgb 12.2, MCV 108.9. Baseline MCV appears to be 98-99. This could be in setting of lymphoma recurrence. Will obtain B12/Folate levels. No medications to explain. No alcohol use. - follow up B12 and folate level - AM CBC  History of B-cell Lymphoma and MGUS: CT chest notable for enlarged mediastinal lymph nodes, largest pretracheal nodes measuring up to 2.5 x 2.3 cm, significantly increased in size compared to most recent prior imaging of the chest dated 03/26/2012. These are concerning for recurrence of lymphoma given patient history and could be characterized metabolic activity on a nonemergent basis by PET-CT clinically appropriate. Last seen by [facility] in 02/18/2019. Did not feel she would be well enough to receive chemotherapy in the future.  - Consider Heme/onc eval outpatient   Chronic Pancytopenia: Stable. Platelets 130, RBC 3.61, WBC 6.4.    FEN/GI: NPO with sips with meds until SLP eval Prophylaxis: Eliquis  Disposition: Tele med-surg  History of Present Illness:  [name] is a 87 y.o. female presenting after a fall at her nursing facility and noted to have hypoxia and lactic acidosis. She notes that she has been feeling more tired/weak. She notes allergy symptoms with some cough over the weekend  but. Endorses the facility suffered from a stomach bug about 2 weeks ago with vomiting and diarrhea and also some abdominal discomfort this past weekend with abdominal pain and diarrhea. Has been otherwise tolerating PO and trying to stay hydrated. Endorsers subjective fevers and chills.   She notes she was using a small walker when she lost her balance and fell to the left side. Notes that she hit her head but did not lose consciousness. She notes that she suffers occasional vertigo and orthostatic hypotension. She is unsure if she had any dizziness prior to her fall. She did note that she waited until she felt steady before walking when she stool up. Denies chest pain or SOB. Denies palpitations.  Denies home oxygen use. Notes DNR status (GOLD paper in ED room) but does want full spectrum care.   Review Of Systems: Per HPI with the following additions:   Review of Systems  Constitutional: Positive for chills and fever.  HENT: Positive for congestion.   Respiratory: Positive for cough and shortness of breath.   Cardiovascular: Negative for chest pain and palpitations.  Gastrointestinal: Positive for abdominal pain, diarrhea and vomiting. Negative for nausea.  Genitourinary: Negative for dysuria, frequency and urgency.  Musculoskeletal: Positive for back pain and falls.  Neurological: Positive for dizziness and weakness. Negative for loss of consciousness.    Patient Active Problem List   Diagnosis Date Noted  . Pneumonia 12-16-20    Past Medical History: History reviewed. No pertinent past medical history.  Chronic a-fib on Eliquis and  Amiodoarone and Proprnaolol, HTN, COPD, GERD, essential tremor, dementia, peripheral neuropathy, DJD, CKD III, h/o lung nodule, h/o B-cell lymphoma/MGUS  Past Surgical History: Skin biopsy of face and leg, ovarian cyst removal, D&C of uterus, colonoscopy, cataract extration, appendectomy  Social History:   Never smoker. Additional social history:  Denies alcohol, tobacco, illicit drugs. Please also refer to relevant sections of EMR.  Family History: No family history on file.  Mom and dad with history of stroke  Allergies and Medications: Allergies  Allergen Reactions  . Amoxicillin Diarrhea and Nausea And Vomiting  . Aspirin Nausea And Vomiting and Other (See Comments)    Heartburn, indigestion   . Benadryl [Diphenhydramine] Nausea And Vomiting  . Drug Ingredient [Sulindac] Other (See Comments)    Unknown; listed on MAR  . Latex Hives  . Peanut-Containing Drug Products Itching and Swelling  . Strawberry (Diagnostic) Other (See Comments)    Heartburn   . Sulfa Antibiotics Itching and Nausea And Vomiting  . Ibuprofen Nausea And Vomiting and Palpitations  . Penicillins Rash   No current facility-administered medications on file prior to encounter.   Current Outpatient Medications on File Prior to Encounter  Medication Sig Dispense Refill  . amiodarone (PACERONE) 200 MG tablet Take 200 mg by mouth daily.    Marland Kitchen atenolol (TENORMIN) 50 MG tablet Take 50 mg by mouth daily.    Marland Kitchen donepezil (ARICEPT) 10 MG tablet Take 10 mg by mouth at bedtime.    . famotidine (PEPCID) 20 MG tablet Take 20 mg by mouth 2 (two) times daily as needed.    . fluticasone (FLONASE) 50 MCG/ACT nasal spray Place 1 spray into both nostrils daily as needed for allergies.    . hydrochlorothiazide (HYDRODIURIL) 25 MG tablet Take 25 mg by mouth daily.    Marland Kitchen LORazepam (ATIVAN) 0.5 MG tablet Take 0.25-0.5 mg by mouth 2 (two) times daily as needed.    Marland Kitchen losartan (COZAAR) 100 MG tablet Take 100 mg by mouth daily.    . meclizine (ANTIVERT) 12.5 MG tablet Take 12.5 mg by mouth 2 (two) times daily.    . propranolol ER (INDERAL LA) 120 MG 24 hr capsule Take 120 mg by mouth daily.    . sertraline (ZOLOFT) 100 MG tablet Take 100 mg by mouth daily.    . traZODone (DESYREL) 50 MG tablet Take 50 mg by mouth at bedtime.    Marland Kitchen ELIQUIS 2.5 MG TABS tablet Take 2.5 mg by mouth 2  (two) times daily.      Objective: BP 131/90 (BP Location: Right Arm)   Pulse 65   Temp 98.7 F (37.1 C) (Rectal)   Resp 20   Ht 5\' 4"  (1.626 m)   Wt 52.2 kg   SpO2 93%   BMI 19.74 kg/m  Exam: General: very frail thin elderly woman, lying comfortably in ED bed with son in law at her side, in no acute distress with non-toxic appearance HEENT: normocephalic, atraumatic CV: regular rate and rhythm without murmurs, rubs, or gallops, no lower extremity edema, 1+ radial and pedal pulses bilaterally Lungs: decreased breath sounds on right lower lung with crackles bilaterally, slightly increased work of breathing on 6L O2 via [place], speaking in full sentences Abdomen: soft, non-tender, non-distended,  normoactive bowel sounds Skin: warm, dry, small chronic area of ecchymosis near left elbow with superficial laceration  Extremities: warm and well perfused, normal tone   Labs and Imaging: CBC BMET  Recent Labs  Lab Dec 26, 2020 0901 Dec 26, 2020 0912  WBC 6.4  --  HGB 12.2 12.6  HCT 39.3 37.0  PLT 130*  --    Recent Labs  Lab 21-Dec-2020 0901 2020-12-21 0912  NA 137 139  K 4.1 4.1  CL 103 104  CO2 19*  --   BUN 24* 28*  CREATININE 1.54* 1.50*  GLUCOSE 137* 133*  CALCIUM 9.0  --      CK 36 PT/INR: 16.5/1.3 Lactic Acid 8.8 COVID/Flu: neg  CT Head Wo Contrast  Result Date: 12-21-2020 CLINICAL DATA:  Head trauma. Level II. Patient is on blood thinners. EXAM: CT HEAD WITHOUT CONTRAST CT CERVICAL SPINE WITHOUT CONTRAST TECHNIQUE: Multidetector CT imaging of the head and cervical spine was performed following the standard protocol without intravenous contrast. Multiplanar CT image reconstructions of the cervical spine were also generated. COMPARISON:  09/02/2018 and 02/04/2019 FINDINGS: CT HEAD FINDINGS Brain: There is significant central cortical atrophy. Periventricular white matter changes are consistent with small vessel disease. There is no intra or extra-axial fluid collection or mass  lesion. The basilar cisterns and ventricles have a normal appearance. There is no CT evidence for acute infarction or hemorrhage. Vascular: There is atherosclerotic calcification of the internal carotid arteries. No hyperdense vessels. Skull: Small circumscribed lytic lesions in the skull base, benign given long-term stability. No acute fracture. Sinuses/Orbits: Chronic opacification of the RIGHT maxillary sinus and ethmoids. There is progressive opacification of the RIGHT frontal sinus. No sinus wall fractures. Other: None CT CERVICAL SPINE FINDINGS Alignment: There is exaggerated lordosis. There is 2 millimeters of anterolisthesis of C4 on C5. There is 2 millimeters of anterolisthesis of C6 on C7. Skull base and vertebrae: No acute fracture. Soft tissues and spinal canal: Unremarkable. Disc levels: Significant disc height loss at C5-6, associated with uncovertebral spurring. Upper chest: Negative. Other: None IMPRESSION: 1. No evidence for acute intracranial abnormality. 2. Chronic sinus opacification. 3. Atrophy and small vessel disease. 4. Degenerative change in the cervical spine, associated exaggerated lordosis. 5. No acute cervical spine abnormality. Electronically Signed   By: Nolon Nations M.D.   On: 2020-12-21 10:06   CT Chest W Contrast  Result Date: 12-21-20 CLINICAL DATA:  Fall, blood thinners, history of follicular lymphoma EXAM: CT CHEST, ABDOMEN, AND PELVIS WITH CONTRAST TECHNIQUE: Multidetector CT imaging of the chest, abdomen and pelvis was performed following the standard protocol during bolus administration of intravenous contrast. CONTRAST:  10mL OMNIPAQUE IOHEXOL 300 MG/ML  SOLN COMPARISON:  PET-CT, 03/26/2012 FINDINGS: CT CHEST FINDINGS Cardiovascular: Aortic atherosclerosis. Cardiomegaly. No pericardial effusion. Mediastinum/Nodes: Enlarged mediastinal lymph nodes, largest pretracheal nodes measuring up to 2.5 x 2.3 cm, significantly increased in size compared to most recent imaging  of the chest dated 03/26/2012 (series 3, image 24). Thyroid gland, trachea, and esophagus demonstrate no significant findings. Lungs/Pleura: Small bilateral pleural effusions. Heterogeneous and ground-glass airspace opacity of the right lower lobe (series 5, image 78). Bandlike scarring and or atelectasis of the lung bases. Musculoskeletal: No chest wall mass or suspicious bone lesions identified. High-grade wedge deformities of the T8 and T9 vertebral bodies, new compared to remote prior imaging dated 03/26/2012 and chronic appearing, although due age indeterminate. CT ABDOMEN PELVIS FINDINGS Hepatobiliary: No solid liver abnormality is seen. No gallstones, gallbladder wall thickening, or biliary dilatation. Pancreas: Unremarkable. No pancreatic ductal dilatation or surrounding inflammatory changes. Spleen: Normal in size without significant abnormality. Adrenals/Urinary Tract: Adrenal glands are unremarkable. Kidneys are normal, without renal calculi, solid lesion, or hydronephrosis. Bladder is unremarkable. Stomach/Bowel: Stomach is within normal limits. Appendix is not clearly visualized. No evidence of  bowel wall thickening, distention, or inflammatory changes. Vascular/Lymphatic: Aortic atherosclerosis. No enlarged abdominal or pelvic lymph nodes. Reproductive: No mass or other abnormality. Other: No abdominal wall hernia or abnormality. No abdominopelvic ascites. Musculoskeletal: No acute or significant osseous findings. IMPRESSION: 1. No CT evidence of acute traumatic injury to the organs of the chest, abdomen, or pelvis. 2. High-grade wedge deformities of the T8 and T9 vertebral bodies, new compared to remote prior imaging dated 03/26/2012 and chronic appearing, although strictly age indeterminate. Correlate for acute point tenderness. 3. Small bilateral pleural effusions. 4. Heterogeneous and ground-glass airspace opacity of the right lower lobe , nonspecific and infectious or inflammatory, favor  aspiration given distribution. 5. Enlarged mediastinal lymph nodes, largest pretracheal nodes measuring up to 2.5 x 2.3 cm, significantly increased in size compared to most recent prior imaging of the chest dated 03/26/2012. These are concerning for recurrence of lymphoma given patient history and could be characterized metabolic activity on a nonemergent basis by PET-CT clinically appropriate. 6. Cardiomegaly. Aortic Atherosclerosis (ICD10-I70.0). Electronically Signed   By: Eddie Candle M.D.   On: Dec 09, 2020 10:05   CT Cervical Spine Wo Contrast  Result Date: 12-09-20 CLINICAL DATA:  Head trauma. Level II. Patient is on blood thinners. EXAM: CT HEAD WITHOUT CONTRAST CT CERVICAL SPINE WITHOUT CONTRAST TECHNIQUE: Multidetector CT imaging of the head and cervical spine was performed following the standard protocol without intravenous contrast. Multiplanar CT image reconstructions of the cervical spine were also generated. COMPARISON:  09/02/2018 and 02/04/2019 FINDINGS: CT HEAD FINDINGS Brain: There is significant central cortical atrophy. Periventricular white matter changes are consistent with small vessel disease. There is no intra or extra-axial fluid collection or mass lesion. The basilar cisterns and ventricles have a normal appearance. There is no CT evidence for acute infarction or hemorrhage. Vascular: There is atherosclerotic calcification of the internal carotid arteries. No hyperdense vessels. Skull: Small circumscribed lytic lesions in the skull base, benign given long-term stability. No acute fracture. Sinuses/Orbits: Chronic opacification of the RIGHT maxillary sinus and ethmoids. There is progressive opacification of the RIGHT frontal sinus. No sinus wall fractures. Other: None CT CERVICAL SPINE FINDINGS Alignment: There is exaggerated lordosis. There is 2 millimeters of anterolisthesis of C4 on C5. There is 2 millimeters of anterolisthesis of C6 on C7. Skull base and vertebrae: No acute  fracture. Soft tissues and spinal canal: Unremarkable. Disc levels: Significant disc height loss at C5-6, associated with uncovertebral spurring. Upper chest: Negative. Other: None IMPRESSION: 1. No evidence for acute intracranial abnormality. 2. Chronic sinus opacification. 3. Atrophy and small vessel disease. 4. Degenerative change in the cervical spine, associated exaggerated lordosis. 5. No acute cervical spine abnormality. Electronically Signed   By: Nolon Nations M.D.   On: 09-Dec-2020 10:06   CT ABDOMEN PELVIS W CONTRAST  Result Date: 09-Dec-2020 CLINICAL DATA:  Fall, blood thinners, history of follicular lymphoma EXAM: CT CHEST, ABDOMEN, AND PELVIS WITH CONTRAST TECHNIQUE: Multidetector CT imaging of the chest, abdomen and pelvis was performed following the standard protocol during bolus administration of intravenous contrast. CONTRAST:  57mL OMNIPAQUE IOHEXOL 300 MG/ML  SOLN COMPARISON:  PET-CT, 03/26/2012 FINDINGS: CT CHEST FINDINGS Cardiovascular: Aortic atherosclerosis. Cardiomegaly. No pericardial effusion. Mediastinum/Nodes: Enlarged mediastinal lymph nodes, largest pretracheal nodes measuring up to 2.5 x 2.3 cm, significantly increased in size compared to most recent imaging of the chest dated 03/26/2012 (series 3, image 24). Thyroid gland, trachea, and esophagus demonstrate no significant findings. Lungs/Pleura: Small bilateral pleural effusions. Heterogeneous and ground-glass airspace opacity of the right  lower lobe (series 5, image 78). Bandlike scarring and or atelectasis of the lung bases. Musculoskeletal: No chest wall mass or suspicious bone lesions identified. High-grade wedge deformities of the T8 and T9 vertebral bodies, new compared to remote prior imaging dated 03/26/2012 and chronic appearing, although due age indeterminate. CT ABDOMEN PELVIS FINDINGS Hepatobiliary: No solid liver abnormality is seen. No gallstones, gallbladder wall thickening, or biliary dilatation. Pancreas:  Unremarkable. No pancreatic ductal dilatation or surrounding inflammatory changes. Spleen: Normal in size without significant abnormality. Adrenals/Urinary Tract: Adrenal glands are unremarkable. Kidneys are normal, without renal calculi, solid lesion, or hydronephrosis. Bladder is unremarkable. Stomach/Bowel: Stomach is within normal limits. Appendix is not clearly visualized. No evidence of bowel wall thickening, distention, or inflammatory changes. Vascular/Lymphatic: Aortic atherosclerosis. No enlarged abdominal or pelvic lymph nodes. Reproductive: No mass or other abnormality. Other: No abdominal wall hernia or abnormality. No abdominopelvic ascites. Musculoskeletal: No acute or significant osseous findings. IMPRESSION: 1. No CT evidence of acute traumatic injury to the organs of the chest, abdomen, or pelvis. 2. High-grade wedge deformities of the T8 and T9 vertebral bodies, new compared to remote prior imaging dated 03/26/2012 and chronic appearing, although strictly age indeterminate. Correlate for acute point tenderness. 3. Small bilateral pleural effusions. 4. Heterogeneous and ground-glass airspace opacity of the right lower lobe , nonspecific and infectious or inflammatory, favor aspiration given distribution. 5. Enlarged mediastinal lymph nodes, largest pretracheal nodes measuring up to 2.5 x 2.3 cm, significantly increased in size compared to most recent prior imaging of the chest dated 03/26/2012. These are concerning for recurrence of lymphoma given patient history and could be characterized metabolic activity on a nonemergent basis by PET-CT clinically appropriate. 6. Cardiomegaly. Aortic Atherosclerosis (ICD10-I70.0). Electronically Signed   By: Eddie Candle M.D.   On: 12-18-2020 10:05   DG Pelvis Portable  Result Date: 12/18/20 CLINICAL DATA:  Fall. EXAM: PORTABLE PELVIS 1-2 VIEWS COMPARISON:  Pelvis x-rays dated August 07, 2018. FINDINGS: There is no evidence of pelvic fracture or  diastasis. No pelvic bone lesions are seen. 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 Similar lower lumbar spondylosis. Osteopenia. Soft tissues are unremarkable. IMPRESSION: 1. No acute osseous abnormality. Electronically Signed   By: Titus Dubin M.D.   On: 2020/12/18 09:28   DG Chest Port 1 View  Result Date: 12-18-20 CLINICAL DATA:  Fall. EXAM: PORTABLE CHEST 1 VIEW COMPARISON:  Chest x-ray dated July 20, 2019. FINDINGS: The patient is rotated to the left, limiting evaluation. Unchanged mild cardiomegaly. Minimal right basilar atelectasis. No focal consolidation, pleural effusion, or pneumothorax. No acute osseous abnormality. IMPRESSION: No active disease. Electronically Signed   By: Titus Dubin M.D.   On: 18-Dec-2020 09:26   Danna Hefty, DO 12-18-20, 2:19 PM PGY-3, [place] [facility] pager: [phone], text pages welcome

## 2020-12-20 NOTE — Progress Notes (Signed)
Orthopedic Tech Progress Note Patient Details:  [name] [birth year] [id number] Level 2 trauma Patient ID: [name], female   DOB: 09/04/1925, 87 y.o.   MRN: [id number]   [name] 2020/12/08, 8:47 AM

## 2020-12-20 NOTE — ED Provider Notes (Signed)
2:49 PM I was called into the patient's room for evaluation as she was waiting for her admission.  Patient reportedly was just minutes before speaking with nursing about if she wanted applesauce to take with medications.  She also reportedly was speaking with her family several minutes ago before they stepped out  Nursing reports that she went somnolent and unresponsive and then started to brady down.  On my arrival, heart rate is in the 20s and she is minimally responsive.  Sternal rub did not seem to wake her up.  Blood pressure began to drop into the 50s.  A bolus of fluids were started which I personally squeezed to try to infuse faster but patient did not seem to improve.  Patient has a DNR at the bedside and we had a discussion together with the team and did not feel that pressors or other medications as atropine would likely be appropriate at this time.  At 1442, patient went asystolic and remained unresponsive.  Per her DNR, we will not do chest compressions or intubate at this time.  Time of death was 1442.  The admitting family medicine team will call the son who had recently stepped out from the emergency department to inform him about her passing as well as call the medical examiner as she was technically a level 2 trauma on arrival to the emergency department.   Anticipate death certificate being filled out by the medical examiner if they take the case versus PCP versus one of the providers that saw her today.   Clarnce Homan, Gwenyth Allegra, MD 2020/12/08 1452

## 2020-12-20 NOTE — ED Provider Notes (Signed)
[facility] EMERGENCY DEPARTMENT Provider Note   CSN: [id number] Arrival date & time: 11/29/2020  0840     History No chief complaint on file.   Athina TAKEYLA MILLION is a 87 y.o. female.  The history is provided by the patient. No language interpreter was used.  Fall This is a new problem. The problem occurs constantly. Nothing aggravates the symptoms. Nothing relieves the symptoms. She has tried nothing for the symptoms. The treatment provided moderate relief.   Pt complains of pain in her low back.  Pt reports she fell.  EMS reports pt is on blood thinners.  Pt reports she thinks she hit her head.     History reviewed. No pertinent past medical history.  There are no problems to display for this patient.      OB History   No obstetric history on file.     No family history on file.     Home Medications Prior to Admission medications   Not on File    Allergies    Patient has no allergy information on record.  Review of Systems   Review of Systems  Skin: Positive for wound.  All other systems reviewed and are negative.   Physical Exam Updated Vital Signs BP 103/79   Pulse 63   Temp 98.7 F (37.1 C) (Rectal)   Resp (!) 25   Ht 5\' 4"  (1.626 m)   Wt 52.2 kg   SpO2 91%   BMI 19.74 kg/m   Physical Exam Vitals and nursing note reviewed.  Constitutional:      Appearance: She is well-developed. She is ill-appearing.  HENT:     Head: Normocephalic.     Nose: Nose normal.     Mouth/Throat:     Mouth: Mucous membranes are moist.  Cardiovascular:     Rate and Rhythm: Normal rate.  Pulmonary:     Effort: Pulmonary effort is normal.  Abdominal:     General: There is no distension.  Musculoskeletal:     Cervical back: Normal range of motion.     Comments: Tender low back diffusely   Skin:    General: Skin is warm.     Capillary Refill: Capillary refill takes less than 2 seconds.  Neurological:     General: No focal deficit present.      Mental Status: She is alert and oriented to person, place, and time.  Psychiatric:        Mood and Affect: Mood normal.     ED Results / Procedures / Treatments   Labs (all labs ordered are listed, but only abnormal results are displayed) Labs Reviewed  COMPREHENSIVE METABOLIC PANEL - Abnormal; Notable for the following components:      Result Value   CO2 19 (*)    Glucose, Bld 137 (*)    BUN 24 (*)    Creatinine, Ser 1.54 (*)    Albumin 3.4 (*)    AST 97 (*)    ALT 71 (*)    Total Bilirubin 1.5 (*)    GFR, Estimated 31 (*)    All other components within normal limits  CBC - Abnormal; Notable for the following components:   RBC 3.61 (*)    MCV 108.9 (*)    Platelets 130 (*)    nRBC 0.3 (*)    All other components within normal limits  LACTIC ACID, PLASMA - Abnormal; Notable for the following components:   Lactic Acid, Venous 8.8 (*)  All other components within normal limits  PROTIME-INR - Abnormal; Notable for the following components:   Prothrombin Time 16.5 (*)    INR 1.3 (*)    All other components within normal limits  I-STAT CHEM 8, ED - Abnormal; Notable for the following components:   BUN 28 (*)    Creatinine, Ser 1.50 (*)    Glucose, Bld 133 (*)    Calcium, Ion 1.13 (*)    TCO2 21 (*)    All other components within normal limits  RESP PANEL BY RT-PCR (FLU A&B, COVID) ARPGX2  ETHANOL  URINALYSIS, ROUTINE W REFLEX MICROSCOPIC  CK  SAMPLE TO BLOOD BANK    EKG EKG Interpretation  Date/Time:  12-15-20 08:48:29 EDT Ventricular Rate:  73 PR Interval:    QRS Duration: 109 QT Interval:  428 QTC Calculation: 479 R Axis:   223 Text Interpretation: Atrial fibrillation Right ventricular hypertrophy Abnormal T, consider ischemia, anterior leads no prior ECG for comparison. No STEMI Confirmed by Antony Blackbird [phone]) on Dec 15, 2020 9:27:30 AM   Radiology CT Head Wo Contrast  Result Date: Dec 15, 2020 CLINICAL DATA:  Head trauma. Level II. Patient  is on blood thinners. EXAM: CT HEAD WITHOUT CONTRAST CT CERVICAL SPINE WITHOUT CONTRAST TECHNIQUE: Multidetector CT imaging of the head and cervical spine was performed following the standard protocol without intravenous contrast. Multiplanar CT image reconstructions of the cervical spine were also generated. COMPARISON:  09/02/2018 and 02/04/2019 FINDINGS: CT HEAD FINDINGS Brain: There is significant central cortical atrophy. Periventricular white matter changes are consistent with small vessel disease. There is no intra or extra-axial fluid collection or mass lesion. The basilar cisterns and ventricles have a normal appearance. There is no CT evidence for acute infarction or hemorrhage. Vascular: There is atherosclerotic calcification of the internal carotid arteries. No hyperdense vessels. Skull: Small circumscribed lytic lesions in the skull base, benign given long-term stability. No acute fracture. Sinuses/Orbits: Chronic opacification of the RIGHT maxillary sinus and ethmoids. There is progressive opacification of the RIGHT frontal sinus. No sinus wall fractures. Other: None CT CERVICAL SPINE FINDINGS Alignment: There is exaggerated lordosis. There is 2 millimeters of anterolisthesis of C4 on C5. There is 2 millimeters of anterolisthesis of C6 on C7. Skull base and vertebrae: No acute fracture. Soft tissues and spinal canal: Unremarkable. Disc levels: Significant disc height loss at C5-6, associated with uncovertebral spurring. Upper chest: Negative. Other: None IMPRESSION: 1. No evidence for acute intracranial abnormality. 2. Chronic sinus opacification. 3. Atrophy and small vessel disease. 4. Degenerative change in the cervical spine, associated exaggerated lordosis. 5. No acute cervical spine abnormality. Electronically Signed   By: Nolon Nations M.D.   On: 2020/12/15 10:06   CT Chest W Contrast  Result Date: 12/15/2020 CLINICAL DATA:  Fall, blood thinners, history of follicular lymphoma EXAM: CT  CHEST, ABDOMEN, AND PELVIS WITH CONTRAST TECHNIQUE: Multidetector CT imaging of the chest, abdomen and pelvis was performed following the standard protocol during bolus administration of intravenous contrast. CONTRAST:  65mL OMNIPAQUE IOHEXOL 300 MG/ML  SOLN COMPARISON:  PET-CT, 03/26/2012 FINDINGS: CT CHEST FINDINGS Cardiovascular: Aortic atherosclerosis. Cardiomegaly. No pericardial effusion. Mediastinum/Nodes: Enlarged mediastinal lymph nodes, largest pretracheal nodes measuring up to 2.5 x 2.3 cm, significantly increased in size compared to most recent imaging of the chest dated 03/26/2012 (series 3, image 24). Thyroid gland, trachea, and esophagus demonstrate no significant findings. Lungs/Pleura: Small bilateral pleural effusions. Heterogeneous and ground-glass airspace opacity of the right lower lobe (series 5, image 78). Bandlike scarring  and or atelectasis of the lung bases. Musculoskeletal: No chest wall mass or suspicious bone lesions identified. High-grade wedge deformities of the T8 and T9 vertebral bodies, new compared to remote prior imaging dated 03/26/2012 and chronic appearing, although due age indeterminate. CT ABDOMEN PELVIS FINDINGS Hepatobiliary: No solid liver abnormality is seen. No gallstones, gallbladder wall thickening, or biliary dilatation. Pancreas: Unremarkable. No pancreatic ductal dilatation or surrounding inflammatory changes. Spleen: Normal in size without significant abnormality. Adrenals/Urinary Tract: Adrenal glands are unremarkable. Kidneys are normal, without renal calculi, solid lesion, or hydronephrosis. Bladder is unremarkable. Stomach/Bowel: Stomach is within normal limits. Appendix is not clearly visualized. No evidence of bowel wall thickening, distention, or inflammatory changes. Vascular/Lymphatic: Aortic atherosclerosis. No enlarged abdominal or pelvic lymph nodes. Reproductive: No mass or other abnormality. Other: No abdominal wall hernia or abnormality. No  abdominopelvic ascites. Musculoskeletal: No acute or significant osseous findings. IMPRESSION: 1. No CT evidence of acute traumatic injury to the organs of the chest, abdomen, or pelvis. 2. High-grade wedge deformities of the T8 and T9 vertebral bodies, new compared to remote prior imaging dated 03/26/2012 and chronic appearing, although strictly age indeterminate. Correlate for acute point tenderness. 3. Small bilateral pleural effusions. 4. Heterogeneous and ground-glass airspace opacity of the right lower lobe , nonspecific and infectious or inflammatory, favor aspiration given distribution. 5. Enlarged mediastinal lymph nodes, largest pretracheal nodes measuring up to 2.5 x 2.3 cm, significantly increased in size compared to most recent prior imaging of the chest dated 03/26/2012. These are concerning for recurrence of lymphoma given patient history and could be characterized metabolic activity on a nonemergent basis by PET-CT clinically appropriate. 6. Cardiomegaly. Aortic Atherosclerosis (ICD10-I70.0). Electronically Signed   By: Eddie Candle M.D.   On: 12-09-20 10:05   CT Cervical Spine Wo Contrast  Result Date: 2020-12-09 CLINICAL DATA:  Head trauma. Level II. Patient is on blood thinners. EXAM: CT HEAD WITHOUT CONTRAST CT CERVICAL SPINE WITHOUT CONTRAST TECHNIQUE: Multidetector CT imaging of the head and cervical spine was performed following the standard protocol without intravenous contrast. Multiplanar CT image reconstructions of the cervical spine were also generated. COMPARISON:  09/02/2018 and 02/04/2019 FINDINGS: CT HEAD FINDINGS Brain: There is significant central cortical atrophy. Periventricular white matter changes are consistent with small vessel disease. There is no intra or extra-axial fluid collection or mass lesion. The basilar cisterns and ventricles have a normal appearance. There is no CT evidence for acute infarction or hemorrhage. Vascular: There is atherosclerotic calcification  of the internal carotid arteries. No hyperdense vessels. Skull: Small circumscribed lytic lesions in the skull base, benign given long-term stability. No acute fracture. Sinuses/Orbits: Chronic opacification of the RIGHT maxillary sinus and ethmoids. There is progressive opacification of the RIGHT frontal sinus. No sinus wall fractures. Other: None CT CERVICAL SPINE FINDINGS Alignment: There is exaggerated lordosis. There is 2 millimeters of anterolisthesis of C4 on C5. There is 2 millimeters of anterolisthesis of C6 on C7. Skull base and vertebrae: No acute fracture. Soft tissues and spinal canal: Unremarkable. Disc levels: Significant disc height loss at C5-6, associated with uncovertebral spurring. Upper chest: Negative. Other: None IMPRESSION: 1. No evidence for acute intracranial abnormality. 2. Chronic sinus opacification. 3. Atrophy and small vessel disease. 4. Degenerative change in the cervical spine, associated exaggerated lordosis. 5. No acute cervical spine abnormality. Electronically Signed   By: Nolon Nations M.D.   On: 12-09-20 10:06   CT ABDOMEN PELVIS W CONTRAST  Result Date: 2020-12-09 CLINICAL DATA:  Fall, blood thinners, history of  follicular lymphoma EXAM: CT CHEST, ABDOMEN, AND PELVIS WITH CONTRAST TECHNIQUE: Multidetector CT imaging of the chest, abdomen and pelvis was performed following the standard protocol during bolus administration of intravenous contrast. CONTRAST:  30mL OMNIPAQUE IOHEXOL 300 MG/ML  SOLN COMPARISON:  PET-CT, 03/26/2012 FINDINGS: CT CHEST FINDINGS Cardiovascular: Aortic atherosclerosis. Cardiomegaly. No pericardial effusion. Mediastinum/Nodes: Enlarged mediastinal lymph nodes, largest pretracheal nodes measuring up to 2.5 x 2.3 cm, significantly increased in size compared to most recent imaging of the chest dated 03/26/2012 (series 3, image 24). Thyroid gland, trachea, and esophagus demonstrate no significant findings. Lungs/Pleura: Small bilateral pleural  effusions. Heterogeneous and ground-glass airspace opacity of the right lower lobe (series 5, image 78). Bandlike scarring and or atelectasis of the lung bases. Musculoskeletal: No chest wall mass or suspicious bone lesions identified. High-grade wedge deformities of the T8 and T9 vertebral bodies, new compared to remote prior imaging dated 03/26/2012 and chronic appearing, although due age indeterminate. CT ABDOMEN PELVIS FINDINGS Hepatobiliary: No solid liver abnormality is seen. No gallstones, gallbladder wall thickening, or biliary dilatation. Pancreas: Unremarkable. No pancreatic ductal dilatation or surrounding inflammatory changes. Spleen: Normal in size without significant abnormality. Adrenals/Urinary Tract: Adrenal glands are unremarkable. Kidneys are normal, without renal calculi, solid lesion, or hydronephrosis. Bladder is unremarkable. Stomach/Bowel: Stomach is within normal limits. Appendix is not clearly visualized. No evidence of bowel wall thickening, distention, or inflammatory changes. Vascular/Lymphatic: Aortic atherosclerosis. No enlarged abdominal or pelvic lymph nodes. Reproductive: No mass or other abnormality. Other: No abdominal wall hernia or abnormality. No abdominopelvic ascites. Musculoskeletal: No acute or significant osseous findings. IMPRESSION: 1. No CT evidence of acute traumatic injury to the organs of the chest, abdomen, or pelvis. 2. High-grade wedge deformities of the T8 and T9 vertebral bodies, new compared to remote prior imaging dated 03/26/2012 and chronic appearing, although strictly age indeterminate. Correlate for acute point tenderness. 3. Small bilateral pleural effusions. 4. Heterogeneous and ground-glass airspace opacity of the right lower lobe , nonspecific and infectious or inflammatory, favor aspiration given distribution. 5. Enlarged mediastinal lymph nodes, largest pretracheal nodes measuring up to 2.5 x 2.3 cm, significantly increased in size compared to most  recent prior imaging of the chest dated 03/26/2012. These are concerning for recurrence of lymphoma given patient history and could be characterized metabolic activity on a nonemergent basis by PET-CT clinically appropriate. 6. Cardiomegaly. Aortic Atherosclerosis (ICD10-I70.0). Electronically Signed   By: Eddie Candle M.D.   On: 2020/12/06 10:05   DG Pelvis Portable  Result Date: 12/06/20 CLINICAL DATA:  Fall. EXAM: PORTABLE PELVIS 1-2 VIEWS COMPARISON:  Pelvis x-rays dated August 07, 2018. FINDINGS: There is no evidence of pelvic fracture or diastasis. No pelvic bone lesions are seen. 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 Similar lower lumbar spondylosis. Osteopenia. Soft tissues are unremarkable. IMPRESSION: 1. No acute osseous abnormality. Electronically Signed   By: Titus Dubin M.D.   On: 2020-12-06 09:28   DG Chest Port 1 View  Result Date: 12/06/2020 CLINICAL DATA:  Fall. EXAM: PORTABLE CHEST 1 VIEW COMPARISON:  Chest x-ray dated July 20, 2019. FINDINGS: The patient is rotated to the left, limiting evaluation. Unchanged mild cardiomegaly. Minimal right basilar atelectasis. No focal consolidation, pleural effusion, or pneumothorax. No acute osseous abnormality. IMPRESSION: No active disease. Electronically Signed   By: Titus Dubin M.D.   On: Dec 06, 2020 09:26    Procedures Procedures   Medications Ordered in ED Medications  iohexol (OMNIPAQUE) 300 MG/ML solution 50 mL (50 mLs Intravenous Contrast Given 2020-12-06 0932)  ED Course  I have reviewed the triage vital signs and the nursing notes.  Pertinent labs & imaging results that were available during my care of the patient were reviewed by me and considered in my medical decision making (see chart for details).    MDM Rules/Calculators/A&P                          MDM:  Chest ct shows bilat pleural effusions  Pt has elevated lactic acid to 8.  Pt has 02 sats in 70's.  I suspect aspiration  I  spoke to [facility] who will admit.   Final Clinical Impression(s) / ED Diagnoses Final diagnoses:  Pleural effusion  Fall, initial encounter    Rx / DC Orders ED Discharge Orders    None       Fransico Meadow, [place] 2020-12-15 1150    Tegeler, Gwenyth Allegra, MD 11/29/20 1319

## 2020-12-20 NOTE — ED Notes (Signed)
Patient transported to CT 

## 2020-12-20 NOTE — Progress Notes (Signed)
FPTS Interim Progress Note  Received phone called from Dr Higinio Plan in ED notifying that patients HR and BP low.  Ordered N/S bolus and went to assess patient.  Upon arrival, patient had expired and pronounced by ED provider.    Family was notified.  Daughter, Stanton Kidney, who is in [place], and son-in-law Merry Proud who had just recently left hospital.  Offered condolences to both daughter and son in [place]. Let them know that should they need any further assistance to please reach out to [place] so we may help in any way.  Medical Examiner notified, reviewed case with him given Trauma 2 on arrival to ED.  Given no documentation of any injuries from trauma noted so therefore not ME case.  Reached out to [place] at [phone] to provide further details.  No answer.  LVM to call Skip Estimable, RN in ED at [phone] should he need any further help.  Nursing Facility also called and notified of patients passing.   Carollee Leitz, MD 12-15-2020, 3:10 PM PGY-2, [facility] Service pager [phone]

## 2020-12-20 NOTE — Death Summary Note (Addendum)
DEATH SUMMARY   Patient Details  Name: [name] MRN: [id number] DOB: February 06, 1926  Admission/Discharge Information   Admit Date:  December 21, 2020  Date of Death: Date of Death: 12/21/20  Time of Death: Time of Death: Nov 03, 1440  Length of Stay: 0  Referring Physician: Pcp, No   Reason(s) for Hospitalization  Hypoxia with lactic acidosis  Aspiration Pneumonia  Diagnoses  Preliminary cause of death: Aspiration Pneumonia  Secondary Diagnoses (including complications and co-morbidities):  Active Problems:   Pneumonia AKI on CKD III Elevated LFT's and Total Bili Atrial Fibrillation  Hypertension GERD Alzheimer's Dementia  History of B-cell Lymphoma, MGUS  Chronic Pancytopenia   Brief Hospital Course (including significant findings, care, treatment, and services provided and events leading to death)  [name] is a 87 y.o. year old female who presented to [facility] on 2022/12/22, admitted for hypoxia with new oxygen requirement and lactic acidosis of 8.8>5.6.  She required up to 6 L of supplemental oxygen. She was mildly hypotensive to 94/72 on arrival. CT chest obtained and notable for small bilateral pleural effusions and groundglass airspace opacity of her right lower lobe.  Given her increased oxygen requirement and this imaging finding, it was presumed that patient had an aspiration pneumonia.  She was started on fluid supplementation and treatment with ceftriaxone and azithromycin.    She was alert and conversational but minutes later became bradycardic, hypotensive, unresponsive in the ED.  Fluid bolus was immediately started. She was unresponsive to sternal rub. Patient was noted to be DNR/DNI.  Time of death called at 1442.   Pertinent Labs and Studies  Significant Diagnostic Studies CT Head Wo Contrast  Result Date: Dec 21, 2020 CLINICAL DATA:  Head trauma. Level II. Patient is on blood thinners. EXAM: CT HEAD WITHOUT CONTRAST CT CERVICAL SPINE WITHOUT CONTRAST TECHNIQUE:  Multidetector CT imaging of the head and cervical spine was performed following the standard protocol without intravenous contrast. Multiplanar CT image reconstructions of the cervical spine were also generated. COMPARISON:  09/02/2018 and 02/04/2019 FINDINGS: CT HEAD FINDINGS Brain: There is significant central cortical atrophy. Periventricular white matter changes are consistent with small vessel disease. There is no intra or extra-axial fluid collection or mass lesion. The basilar cisterns and ventricles have a normal appearance. There is no CT evidence for acute infarction or hemorrhage. Vascular: There is atherosclerotic calcification of the internal carotid arteries. No hyperdense vessels. Skull: Small circumscribed lytic lesions in the skull base, benign given long-term stability. No acute fracture. Sinuses/Orbits: Chronic opacification of the RIGHT maxillary sinus and ethmoids. There is progressive opacification of the RIGHT frontal sinus. No sinus wall fractures. Other: None CT CERVICAL SPINE FINDINGS Alignment: There is exaggerated lordosis. There is 2 millimeters of anterolisthesis of C4 on C5. There is 2 millimeters of anterolisthesis of C6 on C7. Skull base and vertebrae: No acute fracture. Soft tissues and spinal canal: Unremarkable. Disc levels: Significant disc height loss at C5-6, associated with uncovertebral spurring. Upper chest: Negative. Other: None IMPRESSION: 1. No evidence for acute intracranial abnormality. 2. Chronic sinus opacification. 3. Atrophy and small vessel disease. 4. Degenerative change in the cervical spine, associated exaggerated lordosis. 5. No acute cervical spine abnormality. Electronically Signed   By: Nolon Nations M.D.   On: 12-21-2020 10:06   CT Chest W Contrast  Result Date: Dec 21, 2020 CLINICAL DATA:  Fall, blood thinners, history of follicular lymphoma EXAM: CT CHEST, ABDOMEN, AND PELVIS WITH CONTRAST TECHNIQUE: Multidetector CT imaging of the chest, abdomen and  pelvis was performed following  the standard protocol during bolus administration of intravenous contrast. CONTRAST:  74mL OMNIPAQUE IOHEXOL 300 MG/ML  SOLN COMPARISON:  PET-CT, 03/26/2012 FINDINGS: CT CHEST FINDINGS Cardiovascular: Aortic atherosclerosis. Cardiomegaly. No pericardial effusion. Mediastinum/Nodes: Enlarged mediastinal lymph nodes, largest pretracheal nodes measuring up to 2.5 x 2.3 cm, significantly increased in size compared to most recent imaging of the chest dated 03/26/2012 (series 3, image 24). Thyroid gland, trachea, and esophagus demonstrate no significant findings. Lungs/Pleura: Small bilateral pleural effusions. Heterogeneous and ground-glass airspace opacity of the right lower lobe (series 5, image 78). Bandlike scarring and or atelectasis of the lung bases. Musculoskeletal: No chest wall mass or suspicious bone lesions identified. High-grade wedge deformities of the T8 and T9 vertebral bodies, new compared to remote prior imaging dated 03/26/2012 and chronic appearing, although due age indeterminate. CT ABDOMEN PELVIS FINDINGS Hepatobiliary: No solid liver abnormality is seen. No gallstones, gallbladder wall thickening, or biliary dilatation. Pancreas: Unremarkable. No pancreatic ductal dilatation or surrounding inflammatory changes. Spleen: Normal in size without significant abnormality. Adrenals/Urinary Tract: Adrenal glands are unremarkable. Kidneys are normal, without renal calculi, solid lesion, or hydronephrosis. Bladder is unremarkable. Stomach/Bowel: Stomach is within normal limits. Appendix is not clearly visualized. No evidence of bowel wall thickening, distention, or inflammatory changes. Vascular/Lymphatic: Aortic atherosclerosis. No enlarged abdominal or pelvic lymph nodes. Reproductive: No mass or other abnormality. Other: No abdominal wall hernia or abnormality. No abdominopelvic ascites. Musculoskeletal: No acute or significant osseous findings. IMPRESSION: 1. No CT evidence  of acute traumatic injury to the organs of the chest, abdomen, or pelvis. 2. High-grade wedge deformities of the T8 and T9 vertebral bodies, new compared to remote prior imaging dated 03/26/2012 and chronic appearing, although strictly age indeterminate. Correlate for acute point tenderness. 3. Small bilateral pleural effusions. 4. Heterogeneous and ground-glass airspace opacity of the right lower lobe , nonspecific and infectious or inflammatory, favor aspiration given distribution. 5. Enlarged mediastinal lymph nodes, largest pretracheal nodes measuring up to 2.5 x 2.3 cm, significantly increased in size compared to most recent prior imaging of the chest dated 03/26/2012. These are concerning for recurrence of lymphoma given patient history and could be characterized metabolic activity on a nonemergent basis by PET-CT clinically appropriate. 6. Cardiomegaly. Aortic Atherosclerosis (ICD10-I70.0). Electronically Signed   By: Eddie Candle M.D.   On: 12-Dec-2020 10:05   CT Cervical Spine Wo Contrast  Result Date: 12/12/2020 CLINICAL DATA:  Head trauma. Level II. Patient is on blood thinners. EXAM: CT HEAD WITHOUT CONTRAST CT CERVICAL SPINE WITHOUT CONTRAST TECHNIQUE: Multidetector CT imaging of the head and cervical spine was performed following the standard protocol without intravenous contrast. Multiplanar CT image reconstructions of the cervical spine were also generated. COMPARISON:  09/02/2018 and 02/04/2019 FINDINGS: CT HEAD FINDINGS Brain: There is significant central cortical atrophy. Periventricular white matter changes are consistent with small vessel disease. There is no intra or extra-axial fluid collection or mass lesion. The basilar cisterns and ventricles have a normal appearance. There is no CT evidence for acute infarction or hemorrhage. Vascular: There is atherosclerotic calcification of the internal carotid arteries. No hyperdense vessels. Skull: Small circumscribed lytic lesions in the skull  base, benign given long-term stability. No acute fracture. Sinuses/Orbits: Chronic opacification of the RIGHT maxillary sinus and ethmoids. There is progressive opacification of the RIGHT frontal sinus. No sinus wall fractures. Other: None CT CERVICAL SPINE FINDINGS Alignment: There is exaggerated lordosis. There is 2 millimeters of anterolisthesis of C4 on C5. There is 2 millimeters of anterolisthesis of C6 on C7. Skull  base and vertebrae: No acute fracture. Soft tissues and spinal canal: Unremarkable. Disc levels: Significant disc height loss at C5-6, associated with uncovertebral spurring. Upper chest: Negative. Other: None IMPRESSION: 1. No evidence for acute intracranial abnormality. 2. Chronic sinus opacification. 3. Atrophy and small vessel disease. 4. Degenerative change in the cervical spine, associated exaggerated lordosis. 5. No acute cervical spine abnormality. Electronically Signed   By: Nolon Nations M.D.   On: 2020-12-11 10:06   CT ABDOMEN PELVIS W CONTRAST  Result Date: 2020/12/11 CLINICAL DATA:  Fall, blood thinners, history of follicular lymphoma EXAM: CT CHEST, ABDOMEN, AND PELVIS WITH CONTRAST TECHNIQUE: Multidetector CT imaging of the chest, abdomen and pelvis was performed following the standard protocol during bolus administration of intravenous contrast. CONTRAST:  6mL OMNIPAQUE IOHEXOL 300 MG/ML  SOLN COMPARISON:  PET-CT, 03/26/2012 FINDINGS: CT CHEST FINDINGS Cardiovascular: Aortic atherosclerosis. Cardiomegaly. No pericardial effusion. Mediastinum/Nodes: Enlarged mediastinal lymph nodes, largest pretracheal nodes measuring up to 2.5 x 2.3 cm, significantly increased in size compared to most recent imaging of the chest dated 03/26/2012 (series 3, image 24). Thyroid gland, trachea, and esophagus demonstrate no significant findings. Lungs/Pleura: Small bilateral pleural effusions. Heterogeneous and ground-glass airspace opacity of the right lower lobe (series 5, image 78). Bandlike  scarring and or atelectasis of the lung bases. Musculoskeletal: No chest wall mass or suspicious bone lesions identified. High-grade wedge deformities of the T8 and T9 vertebral bodies, new compared to remote prior imaging dated 03/26/2012 and chronic appearing, although due age indeterminate. CT ABDOMEN PELVIS FINDINGS Hepatobiliary: No solid liver abnormality is seen. No gallstones, gallbladder wall thickening, or biliary dilatation. Pancreas: Unremarkable. No pancreatic ductal dilatation or surrounding inflammatory changes. Spleen: Normal in size without significant abnormality. Adrenals/Urinary Tract: Adrenal glands are unremarkable. Kidneys are normal, without renal calculi, solid lesion, or hydronephrosis. Bladder is unremarkable. Stomach/Bowel: Stomach is within normal limits. Appendix is not clearly visualized. No evidence of bowel wall thickening, distention, or inflammatory changes. Vascular/Lymphatic: Aortic atherosclerosis. No enlarged abdominal or pelvic lymph nodes. Reproductive: No mass or other abnormality. Other: No abdominal wall hernia or abnormality. No abdominopelvic ascites. Musculoskeletal: No acute or significant osseous findings. IMPRESSION: 1. No CT evidence of acute traumatic injury to the organs of the chest, abdomen, or pelvis. 2. High-grade wedge deformities of the T8 and T9 vertebral bodies, new compared to remote prior imaging dated 03/26/2012 and chronic appearing, although strictly age indeterminate. Correlate for acute point tenderness. 3. Small bilateral pleural effusions. 4. Heterogeneous and ground-glass airspace opacity of the right lower lobe , nonspecific and infectious or inflammatory, favor aspiration given distribution. 5. Enlarged mediastinal lymph nodes, largest pretracheal nodes measuring up to 2.5 x 2.3 cm, significantly increased in size compared to most recent prior imaging of the chest dated 03/26/2012. These are concerning for recurrence of lymphoma given patient  history and could be characterized metabolic activity on a nonemergent basis by PET-CT clinically appropriate. 6. Cardiomegaly. Aortic Atherosclerosis (ICD10-I70.0). Electronically Signed   By: Eddie Candle M.D.   On: 12/11/2020 10:05   DG Pelvis Portable  Result Date: 2020/12/11 CLINICAL DATA:  Fall. EXAM: PORTABLE PELVIS 1-2 VIEWS COMPARISON:  Pelvis x-rays dated August 07, 2018. FINDINGS: There is no evidence of pelvic fracture or diastasis. No pelvic bone lesions are seen. 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 Similar lower lumbar spondylosis. Osteopenia. Soft tissues are unremarkable. IMPRESSION: 1. No acute osseous abnormality. Electronically Signed   By: Titus Dubin M.D.   On: 12/11/20 09:28   DG Chest [facility]  1 View  Result Date: Dec 01, 2020 CLINICAL DATA:  Fall. EXAM: PORTABLE CHEST 1 VIEW COMPARISON:  Chest x-ray dated July 20, 2019. FINDINGS: The patient is rotated to the left, limiting evaluation. Unchanged mild cardiomegaly. Minimal right basilar atelectasis. No focal consolidation, pleural effusion, or pneumothorax. No acute osseous abnormality. IMPRESSION: No active disease. Electronically Signed   By: Titus Dubin M.D.   On: Dec 01, 2020 09:26    Microbiology Recent Results (from the past 240 hour(s))  Resp Panel by RT-PCR (Flu A&B, Covid) Nasopharyngeal Swab     Status: None   Collection Time: 01-Dec-2020  9:01 AM   Specimen: Nasopharyngeal Swab; Nasopharyngeal(NP) swabs in vial transport medium  Result Value Ref Range Status   SARS Coronavirus 2 by RT PCR NEGATIVE NEGATIVE Final    Comment: (NOTE) SARS-CoV-2 target nucleic acids are NOT DETECTED.  The SARS-CoV-2 RNA is generally detectable in upper respiratory specimens during the acute phase of infection. The lowest concentration of SARS-CoV-2 viral copies this assay can detect is 138 copies/mL. A negative result does not preclude SARS-Cov-2 infection and should not be used as the sole basis  for treatment or other patient management decisions. A negative result may occur with  improper specimen collection/handling, submission of specimen other than nasopharyngeal swab, presence of viral mutation(s) within the areas targeted by this assay, and inadequate number of viral copies(<138 copies/mL). A negative result must be combined with clinical observations, patient history, and epidemiological information. The expected result is Negative.  Fact Sheet for Patients:  EntrepreneurPulse.com.au  Fact Sheet for Healthcare Providers:  IncredibleEmployment.be  This test is no t yet approved or cleared by the [place] FDA and  has been authorized for detection and/or diagnosis of SARS-CoV-2 by FDA under an Emergency Use Authorization (EUA). This EUA will remain  in effect (meaning this test can be used) for the duration of the COVID-19 declaration under Section 564(b)(1) of the Act, 21 U.S.C.section 360bbb-3(b)(1), unless the authorization is terminated  or revoked sooner.       Influenza A by PCR NEGATIVE NEGATIVE Final   Influenza B by PCR NEGATIVE NEGATIVE Final    Comment: (NOTE) The Xpert Xpress SARS-CoV-2/FLU/RSV plus assay is intended as an aid in the diagnosis of influenza from Nasopharyngeal swab specimens and should not be used as a sole basis for treatment. Nasal washings and aspirates are unacceptable for Xpert Xpress SARS-CoV-2/FLU/RSV testing.  Fact Sheet for Patients: EntrepreneurPulse.com.au  Fact Sheet for Healthcare Providers: IncredibleEmployment.be  This test is not yet approved or cleared by the [place] FDA and has been authorized for detection and/or diagnosis of SARS-CoV-2 by FDA under an Emergency Use Authorization (EUA). This EUA will remain in effect (meaning this test can be used) for the duration of the COVID-19 declaration under Section 564(b)(1) of the Act, 21  U.S.C. section 360bbb-3(b)(1), unless the authorization is terminated or revoked.  Performed at [facility], [place] [address], [place], [place] [address]     Lab Basic Metabolic Panel: Recent Labs  Lab Dec 01, 2020 0901 12/01/20 0912  NA 137 139  K 4.1 4.1  CL 103 104  CO2 19*  --   GLUCOSE 137* 133*  BUN 24* 28*  CREATININE 1.54* 1.50*  CALCIUM 9.0  --    Liver Function Tests: Recent Labs  Lab 12-01-20 0901  AST 97*  ALT 71*  ALKPHOS 60  BILITOT 1.5*  PROT 6.5  ALBUMIN 3.4*   No results for input(s): LIPASE, AMYLASE in the last 168 hours.  No results for input(s): AMMONIA in the last 168 hours. CBC: Recent Labs  Lab 12-04-20 0901 04-Dec-2020 0912  WBC 6.4  --   HGB 12.2 12.6  HCT 39.3 37.0  MCV 108.9*  --   PLT 130*  --    Cardiac Enzymes: Recent Labs  Lab 2020/12/04 0901  CKTOTAL 36*   Sepsis Labs: Recent Labs  Lab 2020-12-04 0901  WBC 6.4  LATICACIDVEN 8.8*    Procedures/Operations  None   Kelita Wallis 12/04/2020, 3:05 PM

## 2020-12-20 NOTE — ED Notes (Signed)
Reached out to the [employer] for information regarding pt. No funeral home known at this time. Family requesting belonging (clothes) to go with pt. No family to come to bedside. Informed pt to go to morgue at this time.

## 2020-12-20 DEATH — deceased
